# Patient Record
Sex: Male | Born: 1940 | Race: Black or African American | Hispanic: No | Smoking: Current every day smoker
Health system: Southern US, Community
[De-identification: ages and names within clinical notes are randomized; demographics above are authoritative.]

## PROBLEM LIST (undated history)

## (undated) DIAGNOSIS — I10 Essential (primary) hypertension: Secondary | ICD-10-CM

## (undated) DIAGNOSIS — L509 Urticaria, unspecified: Secondary | ICD-10-CM

## (undated) DIAGNOSIS — E538 Deficiency of other specified B group vitamins: Secondary | ICD-10-CM

## (undated) DIAGNOSIS — D649 Anemia, unspecified: Secondary | ICD-10-CM

## (undated) DIAGNOSIS — T783XXA Angioneurotic edema, initial encounter: Secondary | ICD-10-CM

## (undated) DIAGNOSIS — R4189 Other symptoms and signs involving cognitive functions and awareness: Secondary | ICD-10-CM

## (undated) DIAGNOSIS — K226 Gastro-esophageal laceration-hemorrhage syndrome: Secondary | ICD-10-CM

## (undated) DIAGNOSIS — J309 Allergic rhinitis, unspecified: Secondary | ICD-10-CM

## (undated) DIAGNOSIS — E785 Hyperlipidemia, unspecified: Secondary | ICD-10-CM

## (undated) DIAGNOSIS — I77810 Thoracic aortic ectasia: Secondary | ICD-10-CM

## (undated) DIAGNOSIS — M109 Gout, unspecified: Secondary | ICD-10-CM

## (undated) DIAGNOSIS — E119 Type 2 diabetes mellitus without complications: Secondary | ICD-10-CM

## (undated) DIAGNOSIS — N400 Enlarged prostate without lower urinary tract symptoms: Secondary | ICD-10-CM

## (undated) DIAGNOSIS — D369 Benign neoplasm, unspecified site: Secondary | ICD-10-CM

## (undated) DIAGNOSIS — E079 Disorder of thyroid, unspecified: Secondary | ICD-10-CM

## (undated) HISTORY — PX: ESOPHAGOGASTRODUODENOSCOPY: SHX1529

## (undated) HISTORY — DX: Benign neoplasm, unspecified site: D36.9

## (undated) HISTORY — DX: Essential (primary) hypertension: I10

## (undated) HISTORY — DX: Allergic rhinitis, unspecified: J30.9

## (undated) HISTORY — DX: Angioneurotic edema, initial encounter: T78.3XXA

## (undated) HISTORY — DX: Hyperlipidemia, unspecified: E78.5

## (undated) HISTORY — DX: Type 2 diabetes mellitus without complications: E11.9

## (undated) HISTORY — DX: Thoracic aortic ectasia: I77.810

## (undated) HISTORY — PX: COLONOSCOPY: SHX174

## (undated) HISTORY — DX: Disorder of thyroid, unspecified: E07.9

## (undated) HISTORY — DX: Gastro-esophageal laceration-hemorrhage syndrome: K22.6

## (undated) HISTORY — DX: Urticaria, unspecified: L50.9

## (undated) HISTORY — PX: TONSILLECTOMY: SUR1361

## (undated) HISTORY — PX: COLON SURGERY: SHX602

## (undated) HISTORY — DX: Other symptoms and signs involving cognitive functions and awareness: R41.89

## (undated) HISTORY — DX: Gout, unspecified: M10.9

## (undated) HISTORY — DX: Benign prostatic hyperplasia without lower urinary tract symptoms: N40.0

## (undated) HISTORY — PX: ADENOIDECTOMY: SUR15

## (undated) HISTORY — DX: Deficiency of other specified B group vitamins: E53.8

## (undated) HISTORY — DX: Anemia, unspecified: D64.9

---
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
## 2002-09-08 ENCOUNTER — Inpatient Hospital Stay (HOSPITAL_COMMUNITY): Admission: EM | Admit: 2002-09-08 | Discharge: 2002-09-10 | Payer: Self-pay | Admitting: *Deleted

## 2002-09-10 ENCOUNTER — Encounter (INDEPENDENT_AMBULATORY_CARE_PROVIDER_SITE_OTHER): Payer: Self-pay | Admitting: *Deleted

## 2004-05-07 ENCOUNTER — Encounter: Admission: RE | Admit: 2004-05-07 | Discharge: 2004-05-07 | Payer: Self-pay | Admitting: Family Medicine

## 2004-05-18 ENCOUNTER — Encounter: Admission: RE | Admit: 2004-05-18 | Discharge: 2004-05-18 | Payer: Self-pay | Admitting: Family Medicine

## 2004-07-17 ENCOUNTER — Emergency Department (HOSPITAL_COMMUNITY): Admission: EM | Admit: 2004-07-17 | Discharge: 2004-07-17 | Payer: Self-pay | Admitting: *Deleted

## 2004-07-23 ENCOUNTER — Emergency Department (HOSPITAL_COMMUNITY): Admission: EM | Admit: 2004-07-23 | Discharge: 2004-07-23 | Payer: Self-pay | Admitting: Emergency Medicine

## 2005-06-11 ENCOUNTER — Encounter (INDEPENDENT_AMBULATORY_CARE_PROVIDER_SITE_OTHER): Payer: Self-pay | Admitting: Specialist

## 2005-06-11 ENCOUNTER — Ambulatory Visit (HOSPITAL_BASED_OUTPATIENT_CLINIC_OR_DEPARTMENT_OTHER): Admission: RE | Admit: 2005-06-11 | Discharge: 2005-06-11 | Payer: Self-pay | Admitting: Urology

## 2006-03-09 ENCOUNTER — Ambulatory Visit: Payer: Self-pay | Admitting: Internal Medicine

## 2006-04-08 ENCOUNTER — Ambulatory Visit: Payer: Self-pay | Admitting: Internal Medicine

## 2006-05-20 ENCOUNTER — Ambulatory Visit: Payer: Self-pay | Admitting: Internal Medicine

## 2006-05-20 LAB — CONVERTED CEMR LAB
ALT: 22 units/L (ref 0–40)
AST: 25 units/L (ref 0–37)
Albumin: 3.8 g/dL (ref 3.5–5.2)
Alkaline Phosphatase: 56 units/L (ref 39–117)
BUN: 13 mg/dL (ref 6–23)
Basophils Absolute: 0 10*3/uL (ref 0.0–0.1)
Basophils Relative: 0.5 % (ref 0.0–1.0)
CO2: 24 meq/L (ref 19–32)
Calcium: 9.9 mg/dL (ref 8.4–10.5)
Chloride: 104 meq/L (ref 96–112)
Creatinine, Ser: 1.3 mg/dL (ref 0.4–1.5)
Eosinophil percent: 5.9 % — ABNORMAL HIGH (ref 0.0–5.0)
GFR calc non Af Amer: 59 mL/min
Glomerular Filtration Rate, Af Am: 71 mL/min/{1.73_m2}
Glucose, Bld: 100 mg/dL — ABNORMAL HIGH (ref 70–99)
HCT: 34.5 % — ABNORMAL LOW (ref 39.0–52.0)
Hemoglobin: 11.2 g/dL — ABNORMAL LOW (ref 13.0–17.0)
Lymphocytes Relative: 31.9 % (ref 12.0–46.0)
MCHC: 32.4 g/dL (ref 30.0–36.0)
MCV: 89.6 fL (ref 78.0–100.0)
Monocytes Absolute: 0.7 10*3/uL (ref 0.2–0.7)
Monocytes Relative: 14.8 % — ABNORMAL HIGH (ref 3.0–11.0)
Neutro Abs: 2.2 10*3/uL (ref 1.4–7.7)
Neutrophils Relative %: 46.9 % (ref 43.0–77.0)
Platelets: 220 10*3/uL (ref 150–400)
Potassium: 4.1 meq/L (ref 3.5–5.1)
RBC: 3.86 M/uL — ABNORMAL LOW (ref 4.22–5.81)
RDW: 14.2 % (ref 11.5–14.6)
Sed Rate: 40 mm/hr — ABNORMAL HIGH (ref 0–20)
Sodium: 135 meq/L (ref 135–145)
TSH: 0.46 microintl units/mL (ref 0.35–5.50)
Total Bilirubin: 0.9 mg/dL (ref 0.3–1.2)
Total Protein: 7.4 g/dL (ref 6.0–8.3)
WBC: 4.7 10*3/uL (ref 4.5–10.5)

## 2006-08-18 ENCOUNTER — Ambulatory Visit: Payer: Self-pay | Admitting: Internal Medicine

## 2007-02-14 ENCOUNTER — Ambulatory Visit: Payer: Self-pay | Admitting: Internal Medicine

## 2007-02-27 ENCOUNTER — Encounter: Admission: RE | Admit: 2007-02-27 | Discharge: 2007-02-27 | Payer: Self-pay | Admitting: Family Medicine

## 2007-04-12 ENCOUNTER — Ambulatory Visit: Payer: Self-pay | Admitting: Internal Medicine

## 2007-04-12 LAB — CONVERTED CEMR LAB
ALT: 18 units/L (ref 0–53)
ANA Titer 1: 1:40 {titer} — ABNORMAL HIGH
AST: 22 units/L (ref 0–37)
Albumin: 3.8 g/dL (ref 3.5–5.2)
Alkaline Phosphatase: 61 units/L (ref 39–117)
Anti Nuclear Antibody(ANA): POSITIVE — AB
BUN: 10 mg/dL (ref 6–23)
Basophils Absolute: 0 10*3/uL (ref 0.0–0.1)
Basophils Relative: 0.6 % (ref 0.0–1.0)
Bilirubin, Direct: 0.1 mg/dL (ref 0.0–0.3)
CO2: 30 meq/L (ref 19–32)
Calcium: 10.1 mg/dL (ref 8.4–10.5)
Chloride: 104 meq/L (ref 96–112)
Creatinine, Ser: 1.1 mg/dL (ref 0.4–1.5)
Eosinophils Absolute: 0.1 10*3/uL (ref 0.0–0.6)
Eosinophils Relative: 2 % (ref 0.0–5.0)
GFR calc Af Amer: 86 mL/min
GFR calc non Af Amer: 71 mL/min
Glucose, Bld: 104 mg/dL — ABNORMAL HIGH (ref 70–99)
HCT: 32.5 % — ABNORMAL LOW (ref 39.0–52.0)
Hemoglobin: 10.9 g/dL — ABNORMAL LOW (ref 13.0–17.0)
IgE (Immunoglobulin E), Serum: 262.6 intl units/mL — ABNORMAL HIGH (ref 0.0–180.0)
IgE (Immunoglobulin E), Serum: 269.8 intl units/mL — ABNORMAL HIGH (ref 0.0–180.0)
Lymphocytes Relative: 37 % (ref 12.0–46.0)
MCHC: 33.4 g/dL (ref 30.0–36.0)
MCV: 85.8 fL (ref 78.0–100.0)
Monocytes Absolute: 0.7 10*3/uL (ref 0.2–0.7)
Monocytes Relative: 15.6 % — ABNORMAL HIGH (ref 3.0–11.0)
Neutro Abs: 2.2 10*3/uL (ref 1.4–7.7)
Neutrophils Relative %: 44.8 % (ref 43.0–77.0)
Platelets: 252 10*3/uL (ref 150–400)
Potassium: 3.8 meq/L (ref 3.5–5.1)
RBC: 3.79 M/uL — ABNORMAL LOW (ref 4.22–5.81)
RDW: 15.8 % — ABNORMAL HIGH (ref 11.5–14.6)
Sed Rate: 24 mm/hr — ABNORMAL HIGH (ref 0–20)
Sodium: 138 meq/L (ref 135–145)
TSH: 1.54 microintl units/mL (ref 0.35–5.50)
Total Bilirubin: 0.8 mg/dL (ref 0.3–1.2)
Total Protein: 8 g/dL (ref 6.0–8.3)
WBC: 4.7 10*3/uL (ref 4.5–10.5)

## 2007-04-21 DIAGNOSIS — I152 Hypertension secondary to endocrine disorders: Secondary | ICD-10-CM | POA: Insufficient documentation

## 2007-04-21 DIAGNOSIS — L508 Other urticaria: Secondary | ICD-10-CM | POA: Insufficient documentation

## 2007-04-21 DIAGNOSIS — I10 Essential (primary) hypertension: Secondary | ICD-10-CM | POA: Insufficient documentation

## 2007-05-24 ENCOUNTER — Ambulatory Visit: Payer: Self-pay | Admitting: Internal Medicine

## 2007-05-24 DIAGNOSIS — J309 Allergic rhinitis, unspecified: Secondary | ICD-10-CM | POA: Insufficient documentation

## 2007-06-08 DIAGNOSIS — E049 Nontoxic goiter, unspecified: Secondary | ICD-10-CM | POA: Insufficient documentation

## 2007-10-24 ENCOUNTER — Encounter: Admission: RE | Admit: 2007-10-24 | Discharge: 2007-10-24 | Payer: Self-pay | Admitting: Family Medicine

## 2008-02-15 ENCOUNTER — Ambulatory Visit (HOSPITAL_COMMUNITY): Admission: RE | Admit: 2008-02-15 | Discharge: 2008-02-15 | Payer: Self-pay | Admitting: Family Medicine

## 2008-04-04 ENCOUNTER — Encounter (HOSPITAL_COMMUNITY): Admission: RE | Admit: 2008-04-04 | Discharge: 2008-06-03 | Payer: Self-pay | Admitting: Internal Medicine

## 2008-04-26 ENCOUNTER — Encounter (HOSPITAL_COMMUNITY): Admission: RE | Admit: 2008-04-26 | Discharge: 2008-06-03 | Payer: Self-pay | Admitting: Internal Medicine

## 2008-12-19 ENCOUNTER — Ambulatory Visit (HOSPITAL_COMMUNITY): Admission: RE | Admit: 2008-12-19 | Discharge: 2008-12-19 | Payer: Self-pay | Admitting: Internal Medicine

## 2009-06-13 ENCOUNTER — Ambulatory Visit: Payer: Self-pay | Admitting: Internal Medicine

## 2009-06-13 ENCOUNTER — Inpatient Hospital Stay (HOSPITAL_COMMUNITY): Admission: EM | Admit: 2009-06-13 | Discharge: 2009-06-15 | Payer: Self-pay | Admitting: Emergency Medicine

## 2009-06-14 ENCOUNTER — Encounter (INDEPENDENT_AMBULATORY_CARE_PROVIDER_SITE_OTHER): Payer: Self-pay | Admitting: Internal Medicine

## 2009-06-14 ENCOUNTER — Encounter: Payer: Self-pay | Admitting: Internal Medicine

## 2009-07-07 ENCOUNTER — Ambulatory Visit: Payer: Self-pay | Admitting: Internal Medicine

## 2009-07-07 DIAGNOSIS — D509 Iron deficiency anemia, unspecified: Secondary | ICD-10-CM

## 2009-07-07 DIAGNOSIS — D126 Benign neoplasm of colon, unspecified: Secondary | ICD-10-CM | POA: Insufficient documentation

## 2009-07-07 HISTORY — DX: Iron deficiency anemia, unspecified: D50.9

## 2009-07-08 LAB — CONVERTED CEMR LAB
Basophils Absolute: 0 10*3/uL (ref 0.0–0.1)
Basophils Relative: 0.7 % (ref 0.0–3.0)
Eosinophils Absolute: 0.2 10*3/uL (ref 0.0–0.7)
Eosinophils Relative: 2.9 % (ref 0.0–5.0)
HCT: 36.6 % — ABNORMAL LOW (ref 39.0–52.0)
Hemoglobin: 10.9 g/dL — ABNORMAL LOW (ref 13.0–17.0)
Lymphocytes Relative: 23.5 % (ref 12.0–46.0)
Lymphs Abs: 1.4 10*3/uL (ref 0.7–4.0)
MCHC: 29.7 g/dL — ABNORMAL LOW (ref 30.0–36.0)
MCV: 77 fL — ABNORMAL LOW (ref 78.0–100.0)
Monocytes Absolute: 0.8 10*3/uL (ref 0.1–1.0)
Monocytes Relative: 12.6 % — ABNORMAL HIGH (ref 3.0–12.0)
Neutro Abs: 3.7 10*3/uL (ref 1.4–7.7)
Neutrophils Relative %: 60.3 % (ref 43.0–77.0)
Platelets: 323 10*3/uL (ref 150.0–400.0)
RBC: 4.75 M/uL (ref 4.22–5.81)
RDW: 31 % — ABNORMAL HIGH (ref 11.5–14.6)
WBC: 6.1 10*3/uL (ref 4.5–10.5)

## 2009-07-09 ENCOUNTER — Encounter: Payer: Self-pay | Admitting: Internal Medicine

## 2009-07-11 ENCOUNTER — Telehealth: Payer: Self-pay | Admitting: Internal Medicine

## 2009-08-01 ENCOUNTER — Encounter (INDEPENDENT_AMBULATORY_CARE_PROVIDER_SITE_OTHER): Payer: Self-pay | Admitting: Surgery

## 2009-08-01 ENCOUNTER — Inpatient Hospital Stay (HOSPITAL_COMMUNITY): Admission: RE | Admit: 2009-08-01 | Discharge: 2009-08-05 | Payer: Self-pay | Admitting: Surgery

## 2009-08-01 ENCOUNTER — Encounter (INDEPENDENT_AMBULATORY_CARE_PROVIDER_SITE_OTHER): Payer: Self-pay | Admitting: *Deleted

## 2009-08-01 DIAGNOSIS — D369 Benign neoplasm, unspecified site: Secondary | ICD-10-CM

## 2009-08-01 HISTORY — DX: Benign neoplasm, unspecified site: D36.9

## 2009-09-10 ENCOUNTER — Ambulatory Visit: Payer: Self-pay | Admitting: Internal Medicine

## 2009-09-11 LAB — CONVERTED CEMR LAB
Basophils Absolute: 0 10*3/uL (ref 0.0–0.1)
Basophils Relative: 0.6 % (ref 0.0–3.0)
Eosinophils Absolute: 0.1 10*3/uL (ref 0.0–0.7)
Eosinophils Relative: 2.2 % (ref 0.0–5.0)
HCT: 36.6 % — ABNORMAL LOW (ref 39.0–52.0)
Hemoglobin: 12.2 g/dL — ABNORMAL LOW (ref 13.0–17.0)
Lymphocytes Relative: 30.5 % (ref 12.0–46.0)
Lymphs Abs: 1.7 10*3/uL (ref 0.7–4.0)
MCHC: 33.3 g/dL (ref 30.0–36.0)
MCV: 83.3 fL (ref 78.0–100.0)
Monocytes Absolute: 0.7 10*3/uL (ref 0.1–1.0)
Monocytes Relative: 13.3 % — ABNORMAL HIGH (ref 3.0–12.0)
Neutro Abs: 2.9 10*3/uL (ref 1.4–7.7)
Neutrophils Relative %: 53.4 % (ref 43.0–77.0)
Platelets: 227 10*3/uL (ref 150.0–400.0)
RBC: 4.4 M/uL (ref 4.22–5.81)
RDW: 17 % — ABNORMAL HIGH (ref 11.5–14.6)
WBC: 5.5 10*3/uL (ref 4.5–10.5)

## 2009-10-13 ENCOUNTER — Telehealth: Payer: Self-pay | Admitting: Internal Medicine

## 2010-01-22 ENCOUNTER — Telehealth: Payer: Self-pay | Admitting: Internal Medicine

## 2010-01-23 ENCOUNTER — Ambulatory Visit (HOSPITAL_COMMUNITY): Admission: RE | Admit: 2010-01-23 | Discharge: 2010-01-23 | Payer: Self-pay | Admitting: Family Medicine

## 2010-01-30 ENCOUNTER — Inpatient Hospital Stay (HOSPITAL_COMMUNITY): Admission: EM | Admit: 2010-01-30 | Discharge: 2010-02-03 | Payer: Self-pay | Admitting: Emergency Medicine

## 2010-06-27 ENCOUNTER — Telehealth: Payer: Self-pay | Admitting: Internal Medicine

## 2010-06-29 ENCOUNTER — Encounter: Payer: Self-pay | Admitting: Internal Medicine

## 2010-07-07 NOTE — Progress Notes (Signed)
Summary: triage  Phone Note Call from Patient Call back at Home Phone [phone]   Caller: Patient Call For: Dr Leone Payor Reason for Call: Talk to Nurse Summary of Call: Patient wants to speak to nurse rgarding symptoms that he's having, feels weak and light headed thinks that he has low iron again. Initial call taken by: Tawni Levy,  January 22, 2010 9:19 AM  Follow-up for Phone Call        Pt. c/o dizziness X2 weeks. States he has episodes of fatigue and SOB.  Denies abd. pain, n/v, fever, constipation, diarrhea, blood, black stools.  Pt. advised to call his PCP ASAP for a complete examination. Pt. instructed to call back as needed.  Follow-up by: Laureen Ochs LPN,  January 22, 2010 9:31 AM

## 2010-07-07 NOTE — Assessment & Plan Note (Signed)
Summary: 6 WEEKS/OK PER LEE/APC   Visit Type:  Follow-up PCP:  AV Blount  Chief Complaint:  6wk follow-up.  History of Present Illness: Urticaria- Quiet phase, much less active hives since Sept. Minor one transient on R biceps yesterday. Discussed this as clue to etiology- role of heat?, role of ongoing meds?. Lesions eventually fade completely. never angioedema or wheeze.  Skin Test positives 04/12/07 for aeroallergens, not foods RAST IgE elevated 262.6- 04/14/07, but neg for specific allergens on thaqt assay Not candidate for Xolair. Discussed ideopathic.    Current Allergies: No known allergies      Review of Systems       Mild nasal congestion and eyes water- relates to colds, pollen but not strongly related to hives.   Vital Signs:  Patient Profile:   70 Years Old Male Weight:      189 pounds O2 Sat:      99 % Pulse rate:   88 / minute BP sitting:   122 / 88  (left arm)  Pt. in pain?   no  Vitals Entered By: Cloyde Reams RN (May 24, 2007 9:06 AM) Oxygen therapy Room Air              Comments Urticaria clearing.  Skin looks much better has a couple residual places present.Follow-up on lab work.  Medications reviewed      Physical Exam  General:     normal appearance.   Head:     normocephalic and atraumatic Ears:     TMs intact and clear with normal canals Nose:     no deformity, discharge, inflammation, or lesions Mouth:     no deformity or lesions Neck:     no JVD.   Chest Wall:     no deformities noted Lungs:     clear bilaterally to auscultation and percussion Heart:     regular rate and rhythm, S1, S2 without murmurs, rubs, gallops, or clicks Abdomen:     No hepatosplenomegaly Skin:     No visible urticaria now Cervical Nodes:     no significant adenopathy Axillary Nodes:     no significant adenopathy     Impression & Recommendations:  Problem # 1:  URTICARIA (ICD-708.9) Assessment: Improved Observe for now. as needed  antihistamine or topical steroid. Orders: Est. Patient Level III ([address])   Problem # 2:  ALLERGIC RHINITIS (ICD-477.9) Skin test positive. Seasonal. His updated medication list for this problem includes:    Allegra 180 Mg Tabs (Fexofenadine hcl) .Marland Kitchen... Take 1 tablet by mouth once a day  Orders: Est. Patient Level III ([address])    Patient Instructions: 1)  Please schedule a follow-up appointment in 1 year. 2)  Return eaqrlier as needed 3)  Try taking antihistamine like Claritin, Zyrtec or Benadryl at first sign that a hive is starting.   ]

## 2010-07-07 NOTE — Procedures (Signed)
Summary: Colonoscopy  Patient: [name] Note: All result statuses are Final unless otherwise noted.  Tests: (1) Colonoscopy (COL)   COL Colonoscopy           DONE     [facility]     [address]     [place], [place]  [address]           COLONOSCOPY PROCEDURE REPORT           PATIENT:  [name], [name]  MR#:  [id number]     BIRTHDATE:  [birth year], 68 yrs. old  GENDER:  male           ENDOSCOPIST:  Iva Boop, MD, [facility]     Referred by:  [facility]           PROCEDURE DATE:  06/14/2009     PROCEDURE:  Colonoscopy with biopsy     ASA CLASS:  Class III     INDICATIONS:  FOBT positive stool, iron deficiency anemia           MEDICATIONS:   Fentanyl 75 mcg, Versed 7 mg           DESCRIPTION OF PROCEDURE:   After the risks benefits and     alternatives of the procedure were thoroughly explained, informed     consent was obtained.  Digital rectal exam was performed and     revealed no abnormalities.   The [id number] ([id number]) endoscope was     introduced through the anus and advanced to the cecum, which was     identified by both the appendix and ileocecal valve, without     limitations.  The quality of the prep was good, using Colyte.  The     instrument was then slowly withdrawn as the colon was fully     examined over 10 minutse 21 seconds.     <<PROCEDUREIMAGES>>           FINDINGS:  A sessile polyp was found in the ascending colon. It     was fleshy and soft. It was 3 - 4 cm in size. Multiple biopsies     were obtained and sent to pathology.  This was otherwise a normal     examination of the colon.   Retroflexed views in the rectum     revealed no abnormalities.    The scope was then withdrawn from     the patient and the procedure completed.           COMPLICATIONS:  None           ENDOSCOPIC IMPRESSION:     1) 3 - 4 cm sessile polyp in the ascending colon - biopsies     taken     2) Otherwise normal examination  RECOMMENDATIONS:     if there is cancer in the polyp then definitely needs surgical     resecton, if not will need to discuss possible colonoscopic     polypectomy vs. surgery. Will review this with him. There is no     urgency to remove this, i.e. can be discharged and seen as an     outpatient about this.           EGD next to further evaluate severe microcytic anemia (ferritin 5)                 REPEAT EXAM:  In for Colonoscopy, pending biopsy results.  Iva Boop, MD, [facility]           CC:  Clyda Greener, MD     The Patient           n.     eSIGNED:   Iva Boop at 06/14/2009 12:01 PM           Glenis Smoker, [id number]  Note: An exclamation mark (!) indicates a result that was not dispersed into the flowsheet. Document Creation Date: 06/14/2009 12:02 PM _______________________________________________________________________  (1) Order result status: Final Collection or observation date-time: 06/14/2009 11:27 Requested date-time:  Receipt date-time:  Reported date-time:  Referring Physician:   Ordering Physician: Stan Head [phone]) Specimen Source:  Source: Launa Grill Order Number: [id number] Lab site:

## 2010-07-07 NOTE — Progress Notes (Signed)
Summary: Refill Ferrous Sulfate  Phone Note Call from Patient Call back at [facility] Phone [phone]   Caller: Patient Call For: Dr. Leone Payor Reason for Call: Refill Medication Summary of Call: iron suppliment... [facility] [address] Initial call taken by: Vallarie Mare,  July 11, 2009 9:59 AM  Follow-up for Phone Call        [facility], pt is taking generic Ferrous Sulfate 325mg  three times a day.  Verbal authorization given for 3 refills. Follow-up by: Francee Piccolo CMA Duncan Dull),  July 11, 2009 3:13 PM    Prescriptions: FERROUS SULFATE 325 (65 FE) MG TABS (FERROUS SULFATE) one tablet by mouth three times a day  #90 x 2   Entered by:   Francee Piccolo CMA (AAMA)   Authorized by:   Iva Boop MD, [facility]   Signed by:   Francee Piccolo CMA (AAMA) on 07/11/2009   Method used:   Telephoned to ...       [facility] [address] (retail)       [address] [place], [place]  [address]       Ph: [phone]       Fax: [phone]   RxID:   [id number]

## 2010-07-07 NOTE — Progress Notes (Signed)
Summary: Iron Meds refill  Phone Note Call from Patient Call back at Home Phone [phone]   Call For: Dr Darrick Grinder Reason for Call: Refill Medication Summary of Call: iron medication is almost all gone - unsure if this he needs to continue taking or not. Would like call back. Initial call taken by: Leanor Kail [facility],  Oct 13, 2009 8:13 AM  Follow-up for Phone Call        notified pt to continue ferrous sulfate and come fpr repeat CBC and Ferritin in July.  Advised pt I would send refill to pharmacy for him.  Pt voices inderstanding. Follow-up by: Francee Piccolo CMA Duncan Dull),  Oct 13, 2009 10:18 AM    Prescriptions: FERROUS SULFATE 325 (65 FE) MG TABS (FERROUS SULFATE) one tablet by mouth three times a day  #90 x 3   Entered by:   Francee Piccolo CMA (AAMA)   Authorized by:   Iva Boop MD, [facility]   Signed by:   Francee Piccolo CMA (AAMA) on 10/13/2009   Method used:   Electronically to        [facility] [address] [facility] [phone][facility] (retail)       [address] [place], [place]  [address]       Ph: [phone]       Fax: [phone]   RxID:   [id number]

## 2010-07-07 NOTE — Procedures (Signed)
Summary: EGD: Mallory-Weiss tear   EGD  Procedure date:  09/10/2002  Findings:      Location: [place]      NAME:  [name], [name]                         ACCOUNT NO.:  [id number]   MEDICAL RECORD NO.:  [id number]                   PATIENT TYPE:  INP   LOCATION:  5529                                 FACILITY:  [facility]   PHYSICIAN:  James L. Malon Kindle., M.D.          DATE OF BIRTH:  07-30-1940   DATE OF PROCEDURE:  09/10/2002  DATE OF DISCHARGE:                                 OPERATIVE REPORT   PROCEDURE PERFORMED:  Esophagogastroduodenoscopy and biopsy.   MEDICATIONS:  Demerol 100 mcg, Versed 2 mg IV.   INSTRUMENT USED:  Olympus scope.   ENDOSCOPIST:  Llana Aliment. Randa Evens, M.D.   INDICATIONS FOR PROCEDURE:  The patient has history of gastrointestinal  bleeding.  Had hematemesis after eating.  Has had no further bleeding, no  chronic symptoms.   DESCRIPTION OF PROCEDURE:  The procedure had been explained to the patient  and consent obtained.  With the patient in left lateral decubitus position,  the Olympus scope was inserted and advanced.  The distal esophagus was  reached.  There was a 2 cm hiatal hernia.  Right at the gastroesophageal  junction was an ulcerated area that was swollen and edematous but not  actively bleeding consistent with Mallory-Weiss tear.  The stomach was  entered.  The pylorus identified and passed.  There were no duodenal ulcers.  The pyloric channel was normal.  The antrum was normal.  In the midbody of  the stomach was a 0.5 cm sessile gastric polyp.  I chose to biopsy it rather  than to remove it at this time in order to not create another possible  bleeding site.  The scope was withdrawn.  The fundus and cardia were seen  well.  There was a 2 cm hiatal hernia.  The Mallory-Weiss tear was confirmed  upon withdrawal.  The esophagus was otherwise normal.  The scope was  withdrawn.  The patient tolerated the procedure well.    ASSESSMENT:  1. 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  2. Gastric polyp.    PLAN:  Will continue on Protonix, change to p.o.  Send home when stable.  Will follow up with the office in three to four weeks.  Depending on the  path report, we may end up going ahead and removing the gastric polyp at  that time.                                               James L. Malon Kindle., M.D.    Waldron Session  D:  09/10/2002  T:  09/10/2002  Job:  [id number]   cc:   Dineen Kid. Reche Dixon, M.D.  [address]  [place]  [place] [address]  Fax: [phone]   Dr. Britt Bottom Blunt   This report was created from the original endoscopy report, which was reviewed and signed by the above listed endoscopist.

## 2010-07-07 NOTE — Letter (Signed)
Summary: [facility]   Imported By: Lester [place] 08/04/2009 09:59:36  _____________________________________________________________________  External Attachment:    Type:   Image     Comment:   External Document

## 2010-07-07 NOTE — Procedures (Signed)
Summary: Upper Endoscopy  Patient: [name] Note: All result statuses are Final unless otherwise noted.  Tests: (1) Upper Endoscopy (EGD)   EGD Upper Endoscopy       DONE     [name]     [address]     [place], [place]  [address]           ENDOSCOPY PROCEDURE REPORT           PATIENT:  [name], [name]  MR#:  [id number]     BIRTHDATE:  Oct 31, 1940, 68 yrs. old  GENDER:  male           ENDOSCOPIST:  Iva Boop, MD, [facility]     Referred by:  [facility]           PROCEDURE DATE:  06/14/2009     PROCEDURE:  EGD with snare polypectomy, EGD with Submucosal     Injection     ASA CLASS:  Class III     INDICATIONS:  FOBT + stool, iron deficiency anemia           MEDICATIONS:   There was residual sedation effect present from     prior procedure., Fentanyl 25 mcg, Versed 2 mg     TOPICAL ANESTHETIC:  Cetacaine Spray           DESCRIPTION OF PROCEDURE:   After the risks benefits and     alternatives of the procedure were thoroughly explained, informed     consent was obtained.  The EG-[id number] ([id number]) endoscope was     introduced through the mouth and advanced to the second portion of     the duodenum, without limitations.  The instrument was slowly     withdrawn as the mucosa was fully examined.     <<PROCEDUREIMAGES>>           A sessile polyp was found in the body of the stomach. It was 1 cm     in size. epinephrine injection Polyp was snared, then cauterized     with monopolar cautery. Polyp was retrieved and sent to pathology.     3 cc EPI prior to polypectomy  Otherwise the examination was     normal.    Retroflexed views revealed no abnormalities.    The     scope was then withdrawn from the patient and the procedure     completed.           COMPLICATIONS:  None           ENDOSCOPIC IMPRESSION:     1) 1 cm sessile polyp in the body of the stomach - removed     2) Otherwise normal examination     RECOMMENDATIONS:     1) await  pathology results     iv iron here     po iron at dc     PPI qd x 2 weeks due to gastric polypectomy (reduce risk of     bleeding)     no ASA/NSAIDS     methylmalonic acid/homocysteine levels (B12 is 214)     B12 1000 ug x 1     home tomorrow if ok and I will contact him when pathology in           REPEAT EXAM:  as needed           Iva Boop, MD, [facility]           CC:  Britt Bottom  Bruna Potter, MD, The Patient           n.     eSIGNED:   Iva Boop at 06/14/2009 12:10 PM           [name], [id number]  Note: An exclamation mark (!) indicates a result that was not dispersed into the flowsheet. Document Creation Date: 06/14/2009 12:11 PM _______________________________________________________________________  (1) Order result status: Final Collection or observation date-time: 06/14/2009 11:44 Requested date-time:  Receipt date-time:  Reported date-time:  Referring Physician:   Ordering Physician: Stan Head [phone]) Specimen Source:  Source: Launa Grill Order Number: [id number] Lab site:

## 2010-07-07 NOTE — Assessment & Plan Note (Signed)
Summary: follow up colonoscopy and discuss polyp removal/sheri   History of Present Illness Visit Type: follow up  Primary GI MD: Stan Head MD [facility] Primary Provider: Amalia Hailey, MD  Requesting Provider: n/a Chief Complaint: Follow up colonoscopy  History of Present Illness:   Admitted early January due to severe, symptomatic anemia, was iron-deficient. 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 reacted to iv dextran infusion in hospital, that is my recollection but not documented and he is notr certain, which was part of the problem)  says he saw Dr. Bruna Potter last week but no blood drawn is taking iron, feels stronger   GI Review of Systems      Denies abdominal pain, acid reflux, belching, bloating, chest pain, dysphagia with liquids, dysphagia with solids, heartburn, loss of appetite, nausea, vomiting, vomiting blood, weight loss, and  weight gain.        Denies anal fissure, black tarry stools, change in bowel habit, constipation, diarrhea, diverticulosis, fecal incontinence, heme positive stool, hemorrhoids, irritable bowel syndrome, jaundice, light color stool, liver problems, rectal bleeding, and  rectal pain.    Current Medications (verified): 1)  Flomax 0.4 Mg  Cp24 (Tamsulosin Hcl) .... 2 Tabs At Bedtime 2)  Diovan Hct 160-12.5 Mg  Tabs (Valsartan-Hydrochlorothiazide) .... Take 1 Tablet By Mouth Once A Day 3)  Pravastatin Sodium 80 Mg Tabs (Pravastatin Sodium) .... One Tablet By Mouth Once Daily 4)  Ferrous Sulfate 325 (65 Fe) Mg Tabs (Ferrous Sulfate) .... One Tablet By Mouth Three Times A Day  Allergies (verified): 1)  ! ?infed (Iron Dextran) (Iron Dextran)  Past History:  Past Medical History: BPH Hyperlipidemia Hypertension Iron-deficiency Anemia dx 1/11 Tubulovillous adeoma ascending  clon 1/11 colonoscopy   Past Surgical History: Cholecystectomy Right Knee Surgery   Family History: No FH of Colon Cancer:  Social History: Occupation: Retired Single  Patient currently smokes.  Alcohol Use - no Daily Caffeine Use: coffee  Illicit Drug Use - no Smoking Status:  current Drug Use:  no  Review of Systems       The patient complains of fatigue.         All other ROS negative except as per HPI.   Vital Signs:  Patient profile:   70 year old male Height:      74 inches Weight:      180 pounds BMI:     23.19 BSA:     2.08 Pulse rate:   88 / minute Pulse rhythm:   regular BP sitting:   132 / 80  (left arm) Cuff size:   regular  Vitals Entered By: Ok Anis CMA (July 07, 2009 9:29 AM)  Physical Exam  General:  Well developed, well nourished, no acute distress.   Impression & Recommendations:  Problem # 1:  TUBULOVILLOUS ADENOMA, COLON (ICD-211.3) Assessment Unchanged 3-4 cm lesion in ascending colon, biopsied only. Not tattooed or clipped so not yet marked. We spent 15+ minutes discussing the need for removal of the polyp. I explained what I thought colonoscopic removal (likely 3 colonoscopies in 1 year with no guarantee of complete removal) and surgerical resection (more definitive but different risks, including change in bowels if hemi-colectomy) He has decided to pursue surgery. Appointment made with Dr. Michaell Cowing. Could need pre-op colonoscopy to mark polyp (vs. intra-op by [facility]). Orders: [address] [place]  [facility] ([facility])  Problem # 2:  ANEMIA-IRON DEFICIENCY (ICD-280.9) is taking iron has had EGD/colon as described above Do not think additional work-up needed at this time. [facility] records show normocytic Hgb 11.2 in 2007 ([facility]) Orders: TLB-CBC Platelet - w/Differential (85025-[facility]CD)  Patient Instructions: 1)  Please go to the basement to have your lab tests drawn today. 2)  Your surgery consult has been scheduled with Dr.  Michaell Cowing at [facility] on 07/09/09 @ 9:20am. 3)  Copy sent to : Karie Soda, MD, Clyda Greener, MD 4)  The medication list was reviewed and reconciled.  All changed / newly prescribed medications were explained.  A complete medication list was provided to the patient / caregiver.  Appended Document: follow up colonoscopy and discuss polyp removal/sheri colon recall 1 year to be entered  Appended Document: follow up colonoscopy and discuss polyp removal/sheri Recall entered in IDX and Flowsheet.  Clinical Lists Changes  Observations: Added new observation of COLONNXTDUE: 06/2010 (06/14/2009 14:44)

## 2010-07-09 NOTE — Letter (Signed)
Summary: Appt Reminder 2  [facility]  [address] [place], [place] [address]   Phone: [phone]  Fax: [phone]        June 29, 2010 MRN: [id number]    [name] [address] [place], [place]  [address]    Dear [name],   You have a return appointment with the office nurse on 08/04/10 to discuss your upcoming colonoscopy on 08/11/10.  Please go to the laboratory following this appointment for some blood work for Dr. Leone Payor.    Sincerely,    Darcey Nora RN, CGRN

## 2010-07-09 NOTE — Letter (Signed)
Summary: Pre Visit Letter Revised  [name]  [address] [place], [place] [address]   Phone: [phone]  Fax: [phone]        06/29/2010 MRN: [id number] MAIJOR HORNIG [address] [place], [place]  [address]             Procedure Date:  08/11/10 10:30 a.m.  Welcome to the [name] at [facility].    You are scheduled to see a nurse for your pre-procedure visit on 08/04/10 at 1:00 p.m. on the 3rd floor at [facility], [address]  We ask that you try to arrive at our office 15 minutes prior to your appointment time to allow for check-in.  Please take a minute to review the attached form.  If you answer "Yes" to one or more of the questions on the first page, we ask that you call the person listed at your earliest opportunity.  If you answer "No" to all of the questions, please complete the rest of the form and bring it to your appointment.    Your nurse visit will consist of discussing your medical and surgical history, your immediate family medical history, and your medications.   If you are unable to list all of your medications on the form, please bring the medication bottles to your appointment and we will list them.  We will need to be aware of both prescribed and over the counter drugs.  We will need to know exact dosage information as well.    Please be prepared to read and sign documents such as consent forms, a financial agreement, and acknowledgement forms.  If necessary, and with your consent, a friend or relative is welcome to sit-in on the nurse visit with you.  Please bring your insurance card so that we may make a copy of it.  If your insurance requires a referral to see a specialist, please bring your referral form from your primary care physician.  No co-pay is required for this nurse visit.     If you cannot keep your appointment, please call [phone] to cancel or reschedule prior to your appointment  date.  This allows [name] the opportunity to schedule an appointment for another patient in need of care.    Thank you for choosing [name] for your medical needs.  We appreciate the opportunity to care for you.  Please visit [name] at our website  to learn more about our practice.  Sincerely, [name]

## 2010-07-09 NOTE — Progress Notes (Signed)
Summary: needs colonopscopy and need to see about f/u cbc, etc  Phone Note Outgoing Call   Summary of Call: he is due for colonoscopy and should get previsit looking back we did not get follow-up of anemia labs, etc he was advised to go to PCP in 01/2010 1) request labs from PCP - if done 2) if did not have labs then needs a CBC and ferritin to follow-up iron-def anemia Iva Boop MD, [facility]  June 27, 2010 10:04 AM   Follow-up for Phone Call        no recent labs by PCP.  patient is scheduled for colon 08/11/10 10:30 and a pre-visit on 08/04/10 .  He will come for lab work after his pre-visit Follow-up by: Darcey Nora RN, CGRN,  June 29, 2010 4:21 PM

## 2010-08-03 ENCOUNTER — Encounter (INDEPENDENT_AMBULATORY_CARE_PROVIDER_SITE_OTHER): Payer: Self-pay | Admitting: *Deleted

## 2010-08-04 ENCOUNTER — Encounter (INDEPENDENT_AMBULATORY_CARE_PROVIDER_SITE_OTHER): Payer: Self-pay | Admitting: *Deleted

## 2010-08-05 ENCOUNTER — Encounter (INDEPENDENT_AMBULATORY_CARE_PROVIDER_SITE_OTHER): Payer: Self-pay | Admitting: *Deleted

## 2010-08-05 ENCOUNTER — Other Ambulatory Visit: Payer: Medicare Other

## 2010-08-05 ENCOUNTER — Other Ambulatory Visit: Payer: Self-pay | Admitting: Internal Medicine

## 2010-08-05 DIAGNOSIS — D509 Iron deficiency anemia, unspecified: Secondary | ICD-10-CM

## 2010-08-05 LAB — CBC WITH DIFFERENTIAL/PLATELET
Basophils Absolute: 0 10*3/uL (ref 0.0–0.1)
Basophils Relative: 0.5 % (ref 0.0–3.0)
Eosinophils Absolute: 0.2 10*3/uL (ref 0.0–0.7)
Eosinophils Relative: 4.7 % (ref 0.0–5.0)
HCT: 37.6 % — ABNORMAL LOW (ref 39.0–52.0)
Hemoglobin: 12.5 g/dL — ABNORMAL LOW (ref 13.0–17.0)
Lymphocytes Relative: 23.4 % (ref 12.0–46.0)
Lymphs Abs: 1.2 10*3/uL (ref 0.7–4.0)
MCHC: 33.2 g/dL (ref 30.0–36.0)
MCV: 89.6 fl (ref 78.0–100.0)
Monocytes Absolute: 0.6 10*3/uL (ref 0.1–1.0)
Monocytes Relative: 12.1 % — ABNORMAL HIGH (ref 3.0–12.0)
Neutro Abs: 3 10*3/uL (ref 1.4–7.7)
Neutrophils Relative %: 59.3 % (ref 43.0–77.0)
Platelets: 233 10*3/uL (ref 150.0–400.0)
RBC: 4.19 Mil/uL — ABNORMAL LOW (ref 4.22–5.81)
RDW: 13.3 % (ref 11.5–14.6)
WBC: 5.1 10*3/uL (ref 4.5–10.5)

## 2010-08-05 LAB — FERRITIN: Ferritin: 117.2 ng/mL (ref 22.0–322.0)

## 2010-08-11 ENCOUNTER — Other Ambulatory Visit: Payer: Self-pay | Admitting: Internal Medicine

## 2010-08-11 ENCOUNTER — Other Ambulatory Visit (AMBULATORY_SURGERY_CENTER): Payer: Medicare Other | Admitting: Internal Medicine

## 2010-08-11 ENCOUNTER — Other Ambulatory Visit: Payer: Medicare Other

## 2010-08-11 ENCOUNTER — Other Ambulatory Visit: Payer: Self-pay | Admitting: Gastroenterology

## 2010-08-11 ENCOUNTER — Encounter: Payer: Self-pay | Admitting: Internal Medicine

## 2010-08-11 DIAGNOSIS — R933 Abnormal findings on diagnostic imaging of other parts of digestive tract: Secondary | ICD-10-CM

## 2010-08-11 DIAGNOSIS — K5289 Other specified noninfective gastroenteritis and colitis: Secondary | ICD-10-CM

## 2010-08-11 DIAGNOSIS — D509 Iron deficiency anemia, unspecified: Secondary | ICD-10-CM

## 2010-08-11 DIAGNOSIS — Z1211 Encounter for screening for malignant neoplasm of colon: Secondary | ICD-10-CM

## 2010-08-11 DIAGNOSIS — Z79899 Other long term (current) drug therapy: Secondary | ICD-10-CM

## 2010-08-11 DIAGNOSIS — Z86010 Personal history of colonic polyps: Secondary | ICD-10-CM

## 2010-08-11 LAB — VITAMIN B12: Vitamin B-12: 153 pg/mL — ABNORMAL LOW (ref 211–911)

## 2010-08-11 LAB — GLUCOSE, CAPILLARY
Glucose-Capillary: 105 mg/dL — ABNORMAL HIGH (ref 70–99)
Glucose-Capillary: 93 mg/dL (ref 70–99)

## 2010-08-13 NOTE — Letter (Signed)
Summary: Diabetic Instructions  [facility]  [address] [place], [place] [address]   Phone: [phone]  Fax: [phone]    [name] [birth year] MRN: [id number]   _  x_   ORAL DIABETIC MEDICATION INSTRUCTIONS  The day before your procedure:   Take your diabetic pill as you do normally  The day of your procedure:   Do not take your diabetic pill    We will check your blood sugar levels during the admission process and again in Recovery before discharging you home  ________________________________________________________________________  _x  _   INSULIN (LONG ACTING) MEDICATION INSTRUCTIONS (Lantus, NPH, 70/30, Humulin, Novolin-N)   The day before your procedure:   Take  your regular evening dose    The day of your procedure:   Do not take your morning dose

## 2010-08-13 NOTE — Letter (Signed)
Summary: [facility] Instructions  [facility]  [address] [place], [place] [address]   Phone: [phone]  Fax: [phone]       [name]    [birth year]    MRN: [id number]        Procedure Day Dorna Bloom:  [name]  08/11/10     Arrival Time:  9:30AM     Procedure Time:  10:30AM     Location of Procedure:                    _X _  [facility] (4th Floor)                      PREPARATION FOR COLONOSCOPY WITH MOVIPREP   Starting 5 days prior to your procedure 08/06/10 do not eat nuts, seeds, popcorn, corn, beans, peas,  salads, or any raw vegetables.  Do not take any fiber supplements (e.g. Metamucil, Citrucel, and Benefiber).  THE DAY BEFORE YOUR PROCEDURE         DATE: 08/10/10  DAY: MONDAY  1.  Drink clear liquids the entire day-NO SOLID FOOD  2.  Do not drink anything colored red or purple.  Avoid juices with pulp.  No orange juice.  3.  Drink at least 64 oz. (8 glasses) of fluid/clear liquids during the day to prevent dehydration and help the prep work efficiently.  CLEAR LIQUIDS INCLUDE: Water Jello Ice Popsicles Tea (sugar ok, no milk/cream) Powdered fruit flavored drinks Coffee (sugar ok, no milk/cream) Gatorade Juice: apple, white grape, white cranberry  Lemonade Clear bullion, consomm, broth Carbonated beverages (any kind) Strained chicken noodle soup Hard Candy                             4.  In the morning, mix first dose of MoviPrep solution:    Empty 1 Pouch A and 1 Pouch B into the disposable container    Add lukewarm drinking water to the top line of the container. Mix to dissolve    Refrigerate (mixed solution should be used within 24 hrs)  5.  Begin drinking the prep at 5:00 p.m. The MoviPrep container is divided by 4 marks.   Every 15 minutes drink the solution down to the next mark (approximately 8 oz) until the full liter is complete.   6.  Follow completed prep with 16 oz of clear liquid of your choice (Nothing  red or purple).  Continue to drink clear liquids until bedtime.  7.  Before going to bed, mix second dose of MoviPrep solution:    Empty 1 Pouch A and 1 Pouch B into the disposable container    Add lukewarm drinking water to the top line of the container. Mix to dissolve    Refrigerate  THE DAY OF YOUR PROCEDURE      DATE: 08/11/10  DAY: TUESDAY  Beginning at 5:30AM (5 hours before procedure):         1. Every 15 minutes, drink the solution down to the next mark (approx 8 oz) until the full liter is complete.  2. Follow completed prep with 16 oz. of clear liquid of your choice.    3. You may drink clear liquids until 8:30AM (2 HOURS BEFORE PROCEDURE).   MEDICATION INSTRUCTIONS  Unless otherwise instructed, you should take regular prescription medications with a small sip of water   as early as possible the morning of your  procedure.  Diabetic patients - see separate instructions.         OTHER INSTRUCTIONS  You will need a responsible adult at least 71 years of age to accompany you and drive you home.   This person must remain in the waiting room during your procedure.  Wear loose fitting clothing that is easily removed.  Leave jewelry and other valuables at home.  However, you may wish to bring a book to read or  an iPod/MP3 player to listen to music as you wait for your procedure to start.  Remove all body piercing jewelry and leave at home.  Total time from sign-in until discharge is approximately 2-3 hours.  You should go home directly after your procedure and rest.  You can resume normal activities the  day after your procedure.  The day of your procedure you should not:   Drive   Make legal decisions   Operate machinery   Drink alcohol   Return to work  You will receive specific instructions about eating, activities and medications before you leave.    The above instructions have been reviewed and explained to me by   Wyona Almas RN  August 04, 2010 1:14 PM     I fully understand and can verbalize these instructions _____________________________ Date _________

## 2010-08-13 NOTE — Miscellaneous (Signed)
Summary: [facility] Previsit/prep  Clinical Lists Changes  Medications: Added new medication of MOVIPREP 100 GM  SOLR (PEG-KCL-NACL-NASULF-NA ASC-C) As per prep instructions. - Signed Rx of MOVIPREP 100 GM  SOLR (PEG-KCL-NACL-NASULF-NA ASC-C) As per prep instructions.;  #1 x 0;  Signed;  Entered by: Wyona Almas RN;  Authorized by: Iva Boop MD, [facility];  Method used: Electronically to [facility] # [phone]*, [address] [place], [place], [place]  [address], Ph: [phone], Fax: [phone] Observations: Added new observation of ALLERGY REV: Done (08/04/2010 12:40)    Prescriptions: MOVIPREP 100 GM  SOLR (PEG-KCL-NACL-NASULF-NA ASC-C) As per prep instructions.  #1 x 0   Entered by:   Wyona Almas RN   Authorized by:   Iva Boop MD, [facility]   Signed by:   Wyona Almas RN on 08/04/2010   Method used:   Electronically to        [facility] # [phone][facility] (retail)       [address] [place], [place]  [address]       Ph: [phone]       Fax: [phone]   RxID:   [id number]   Appended Document: [facility] Previsit/prep Signed accidentally.  The doctor is Stan Head.

## 2010-08-14 ENCOUNTER — Encounter (INDEPENDENT_AMBULATORY_CARE_PROVIDER_SITE_OTHER): Payer: Medicare Other

## 2010-08-14 ENCOUNTER — Encounter: Payer: Self-pay | Admitting: Internal Medicine

## 2010-08-14 DIAGNOSIS — E538 Deficiency of other specified B group vitamins: Secondary | ICD-10-CM

## 2010-08-18 ENCOUNTER — Encounter: Payer: Self-pay | Admitting: Internal Medicine

## 2010-08-18 NOTE — Miscellaneous (Signed)
Summary: Orders Update  Clinical Lists Changes  Orders: Added new Test order of TLB-B12, Serum-Total ONLY (82607-B12) - Signed 

## 2010-08-18 NOTE — Assessment & Plan Note (Signed)
Summary: b12 injection #1 of 4 weekly/sheri  Nurse Visit   Allergies: 1)  ! 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

## 2010-08-18 NOTE — Procedures (Addendum)
Summary: Colonoscopy  Patient: [name] Note: All result statuses are Final unless otherwise noted.  Tests: (1) Colonoscopy (COL)   COL Colonoscopy           DONE (C)     [facility]     [address]     [place], [place]  [address]          COLONOSCOPY PROCEDURE REPORT          PATIENT:  [name], [name]  MR#:  [id number]     BIRTHDATE:  [birth year], 69 yrs. old  GENDER:  male     ENDOSCOPIST:  Iva Boop, MD, [facility]          PROCEDURE DATE:  08/11/2010     PROCEDURE:  Colonoscopy with biopsy     ASA CLASS:  Class II     INDICATIONS:  surveillance and high-risk screening 4 cm adenoma     removed last year (found at index colonoscopy), closer follow-up     exam due to size of polyp     MEDICATIONS:  None ADDENDUM: Fentanyl 75 microgranms IV and Versed     7 milligrams IV          DESCRIPTION OF PROCEDURE:   After the risks benefits and     alternatives of the procedure were thoroughly explained, informed     consent was obtained.  Digital rectal exam was performed and     revealed no rectal masses and prostate nodule.  ? nodule at right     lateral base of prostate The LB 180AL [id number] endoscope was     introduced through the anus and advanced to the anastomosis,     without limitations.  The quality of the prep was adequate, using     MoviPrep.  The instrument was then slowly withdrawn as the colon     was fully examined. Insertion: 3:12 minutes withdrawal: 9:35     minutes     <<PROCEDUREIMAGES>>          FINDINGS:  Prior right hemi-colectomy. Abnormal appearing mucosa     anastomosis ? neoplastic change vs prominent small bowel mucosa -     1 cm area Multiple biopsies were obtained and sent to pathology.     This was otherwise a normal examination of the colon.   Retroflexed     views in the rectum revealed no abnormalities.    The scope was     then withdrawn from the patient and the procedure completed.          COMPLICATIONS:  None  ENDOSCOPIC IMPRESSION:     1) Abnormal mucosa in the anastomosis, ? neoplasia vs. prominant     small bowel mucosa     2) Otherwise normal examination, s/p resection of 4 cm adenoma     2011 (right hemi-colectomy)     RECOMMENDATIONS:     1) Go to lab for B12 level today. His Hgb is 12.5, normocytic     with ferritin >100 now.     2) Unless he has seen urology, will have him referred re?     prostate nodule - will notify via my office     REPEAT EXAM:  In for Colonoscopy, pending biopsy results.          Iva Boop, MD, [facility]          CC:  Clyda Greener, MD, Karie Soda, MD and The Patient  n.     REVISED:  08/18/2010 07:44 AM     eSIGNED:   Iva Boop at 08/18/2010 07:44 AM          Glenis Smoker, [phone]  Note: An exclamation mark (!) indicates a result that was not dispersed into the flowsheet. Document Creation Date: 08/18/2010 7:44 AM _______________________________________________________________________  (1) Order result status: Final Collection or observation date-time: 08/11/2010 11:32 Requested date-time:  Receipt date-time:  Reported date-time:  Referring Physician:   Ordering Physician: Stan Head [id number]) Specimen Source:  Source: Launa Grill Order Number: [id number] Lab site:   Appended Document: Colonoscopy     Procedures Next Due Date:    Colonoscopy: 08/2013

## 2010-08-20 LAB — POCT I-STAT 3, ART BLOOD GAS (G3+)
Acid-base deficit: 1 mmol/L (ref 0.0–2.0)
Bicarbonate: 23.6 mEq/L (ref 20.0–24.0)
O2 Saturation: 94 %
Patient temperature: 98.6
TCO2: 25 mmol/L (ref 0–100)
pCO2 arterial: 40.1 mmHg (ref 35.0–45.0)
pH, Arterial: 7.378 (ref 7.350–7.450)
pO2, Arterial: 70 mmHg — ABNORMAL LOW (ref 80.0–100.0)

## 2010-08-20 LAB — URINALYSIS, ROUTINE W REFLEX MICROSCOPIC
Bilirubin Urine: NEGATIVE
Glucose, UA: >1000 mg/dL — AB
Hgb urine dipstick: NEGATIVE
Ketones, ur: NEGATIVE mg/dL
Leukocytes, UA: NEGATIVE
Nitrite: NEGATIVE
Protein, ur: NEGATIVE mg/dL
Specific Gravity, Urine: 1.025 (ref 1.005–1.030)
Urobilinogen, UA: 0.2 mg/dL (ref 0.0–1.0)
pH: 6 (ref 5.0–8.0)

## 2010-08-20 LAB — GLUCOSE, CAPILLARY
Glucose-Capillary: 100 mg/dL — ABNORMAL HIGH (ref 70–99)
Glucose-Capillary: 105 mg/dL — ABNORMAL HIGH (ref 70–99)
Glucose-Capillary: 111 mg/dL — ABNORMAL HIGH (ref 70–99)
Glucose-Capillary: 113 mg/dL — ABNORMAL HIGH (ref 70–99)
Glucose-Capillary: 122 mg/dL — ABNORMAL HIGH (ref 70–99)
Glucose-Capillary: 123 mg/dL — ABNORMAL HIGH (ref 70–99)
Glucose-Capillary: 124 mg/dL — ABNORMAL HIGH (ref 70–99)
Glucose-Capillary: 129 mg/dL — ABNORMAL HIGH (ref 70–99)
Glucose-Capillary: 146 mg/dL — ABNORMAL HIGH (ref 70–99)
Glucose-Capillary: 151 mg/dL — ABNORMAL HIGH (ref 70–99)
Glucose-Capillary: 154 mg/dL — ABNORMAL HIGH (ref 70–99)
Glucose-Capillary: 182 mg/dL — ABNORMAL HIGH (ref 70–99)
Glucose-Capillary: 186 mg/dL — ABNORMAL HIGH (ref 70–99)
Glucose-Capillary: 202 mg/dL — ABNORMAL HIGH (ref 70–99)
Glucose-Capillary: 213 mg/dL — ABNORMAL HIGH (ref 70–99)
Glucose-Capillary: 214 mg/dL — ABNORMAL HIGH (ref 70–99)
Glucose-Capillary: 222 mg/dL — ABNORMAL HIGH (ref 70–99)
Glucose-Capillary: 224 mg/dL — ABNORMAL HIGH (ref 70–99)
Glucose-Capillary: 228 mg/dL — ABNORMAL HIGH (ref 70–99)
Glucose-Capillary: 243 mg/dL — ABNORMAL HIGH (ref 70–99)
Glucose-Capillary: 254 mg/dL — ABNORMAL HIGH (ref 70–99)
Glucose-Capillary: 256 mg/dL — ABNORMAL HIGH (ref 70–99)
Glucose-Capillary: 258 mg/dL — ABNORMAL HIGH (ref 70–99)
Glucose-Capillary: 300 mg/dL — ABNORMAL HIGH (ref 70–99)
Glucose-Capillary: 305 mg/dL — ABNORMAL HIGH (ref 70–99)
Glucose-Capillary: 426 mg/dL — ABNORMAL HIGH (ref 70–99)
Glucose-Capillary: 80 mg/dL (ref 70–99)
Glucose-Capillary: 91 mg/dL (ref 70–99)
Glucose-Capillary: 97 mg/dL (ref 70–99)
Glucose-Capillary: >600 mg/dL (ref 70–99)
Glucose-Capillary: >600 mg/dL (ref 70–99)

## 2010-08-20 LAB — CBC
HCT: 29.5 % — ABNORMAL LOW (ref 39.0–52.0)
HCT: 30.5 % — ABNORMAL LOW (ref 39.0–52.0)
HCT: 31 % — ABNORMAL LOW (ref 39.0–52.0)
HCT: 39.8 % (ref 39.0–52.0)
HCT: 40.7 % (ref 39.0–52.0)
Hemoglobin: 10.4 g/dL — ABNORMAL LOW (ref 13.0–17.0)
Hemoglobin: 10.5 g/dL — ABNORMAL LOW (ref 13.0–17.0)
Hemoglobin: 10.8 g/dL — ABNORMAL LOW (ref 13.0–17.0)
Hemoglobin: 13.3 g/dL (ref 13.0–17.0)
Hemoglobin: 14.5 g/dL (ref 13.0–17.0)
MCH: 29.6 pg (ref 26.0–34.0)
MCH: 29.8 pg (ref 26.0–34.0)
MCH: 30 pg (ref 26.0–34.0)
MCH: 30.3 pg (ref 26.0–34.0)
MCH: 30.4 pg (ref 26.0–34.0)
MCHC: 33.4 g/dL (ref 30.0–36.0)
MCHC: 34.4 g/dL (ref 30.0–36.0)
MCHC: 34.8 g/dL (ref 30.0–36.0)
MCHC: 35.3 g/dL (ref 30.0–36.0)
MCHC: 35.6 g/dL (ref 30.0–36.0)
MCV: 84.5 fL (ref 78.0–100.0)
MCV: 85.3 fL (ref 78.0–100.0)
MCV: 85.9 fL (ref 78.0–100.0)
MCV: 87.1 fL (ref 78.0–100.0)
MCV: 89.8 fL (ref 78.0–100.0)
Platelets: 188 10*3/uL (ref 150–400)
Platelets: 192 10*3/uL (ref 150–400)
Platelets: 195 10*3/uL (ref 150–400)
Platelets: 202 10*3/uL (ref 150–400)
Platelets: 216 10*3/uL (ref 150–400)
RBC: 3.49 MIL/uL — ABNORMAL LOW (ref 4.22–5.81)
RBC: 3.55 MIL/uL — ABNORMAL LOW (ref 4.22–5.81)
RBC: 3.56 MIL/uL — ABNORMAL LOW (ref 4.22–5.81)
RBC: 4.43 MIL/uL (ref 4.22–5.81)
RBC: 4.77 MIL/uL (ref 4.22–5.81)
RDW: 12.1 % (ref 11.5–15.5)
RDW: 12.1 % (ref 11.5–15.5)
RDW: 12.4 % (ref 11.5–15.5)
RDW: 12.6 % (ref 11.5–15.5)
RDW: 12.6 % (ref 11.5–15.5)
WBC: 4.8 10*3/uL (ref 4.0–10.5)
WBC: 5.1 10*3/uL (ref 4.0–10.5)
WBC: 5.2 10*3/uL (ref 4.0–10.5)
WBC: 5.5 10*3/uL (ref 4.0–10.5)
WBC: 5.6 10*3/uL (ref 4.0–10.5)

## 2010-08-20 LAB — DIFFERENTIAL
Basophils Absolute: 0 10*3/uL (ref 0.0–0.1)
Basophils Absolute: 0 10*3/uL (ref 0.0–0.1)
Basophils Absolute: 0 10*3/uL (ref 0.0–0.1)
Basophils Relative: 0 % (ref 0–1)
Basophils Relative: 0 % (ref 0–1)
Basophils Relative: 1 % (ref 0–1)
Eosinophils Absolute: 0 10*3/uL (ref 0.0–0.7)
Eosinophils Absolute: 0.1 10*3/uL (ref 0.0–0.7)
Eosinophils Absolute: 0.1 10*3/uL (ref 0.0–0.7)
Eosinophils Relative: 0 % (ref 0–5)
Eosinophils Relative: 1 % (ref 0–5)
Eosinophils Relative: 2 % (ref 0–5)
Lymphocytes Relative: 24 % (ref 12–46)
Lymphocytes Relative: 34 % (ref 12–46)
Lymphocytes Relative: 36 % (ref 12–46)
Lymphs Abs: 1.2 10*3/uL (ref 0.7–4.0)
Lymphs Abs: 1.9 10*3/uL (ref 0.7–4.0)
Lymphs Abs: 2 10*3/uL (ref 0.7–4.0)
Monocytes Absolute: 0.5 10*3/uL (ref 0.1–1.0)
Monocytes Absolute: 0.5 10*3/uL (ref 0.1–1.0)
Monocytes Absolute: 0.6 10*3/uL (ref 0.1–1.0)
Monocytes Relative: 10 % (ref 3–12)
Monocytes Relative: 11 % (ref 3–12)
Monocytes Relative: 9 % (ref 3–12)
Neutro Abs: 2.9 10*3/uL (ref 1.7–7.7)
Neutro Abs: 2.9 10*3/uL (ref 1.7–7.7)
Neutro Abs: 3.4 10*3/uL (ref 1.7–7.7)
Neutrophils Relative %: 51 % (ref 43–77)
Neutrophils Relative %: 53 % (ref 43–77)
Neutrophils Relative %: 67 % (ref 43–77)

## 2010-08-20 LAB — COMPREHENSIVE METABOLIC PANEL
ALT: 10 U/L (ref 0–53)
ALT: 13 U/L (ref 0–53)
ALT: 14 U/L (ref 0–53)
ALT: 23 U/L (ref 0–53)
AST: 15 U/L (ref 0–37)
AST: 17 U/L (ref 0–37)
AST: 17 U/L (ref 0–37)
AST: 22 U/L (ref 0–37)
Albumin: 2.7 g/dL — ABNORMAL LOW (ref 3.5–5.2)
Albumin: 3.2 g/dL — ABNORMAL LOW (ref 3.5–5.2)
Albumin: 3.8 g/dL (ref 3.5–5.2)
Albumin: 4.3 g/dL (ref 3.5–5.2)
Alkaline Phosphatase: 57 U/L (ref 39–117)
Alkaline Phosphatase: 65 U/L (ref 39–117)
Alkaline Phosphatase: 77 U/L (ref 39–117)
Alkaline Phosphatase: 83 U/L (ref 39–117)
BUN: 20 mg/dL (ref 6–23)
BUN: 30 mg/dL — ABNORMAL HIGH (ref 6–23)
BUN: 45 mg/dL — ABNORMAL HIGH (ref 6–23)
BUN: 57 mg/dL — ABNORMAL HIGH (ref 6–23)
CO2: 21 mEq/L (ref 19–32)
CO2: 22 mEq/L (ref 19–32)
CO2: 22 mEq/L (ref 19–32)
CO2: 23 mEq/L (ref 19–32)
Calcium: 10.3 mg/dL (ref 8.4–10.5)
Calcium: 8.2 mg/dL — ABNORMAL LOW (ref 8.4–10.5)
Calcium: 8.6 mg/dL (ref 8.4–10.5)
Calcium: 9 mg/dL (ref 8.4–10.5)
Chloride: 111 mEq/L (ref 96–112)
Chloride: 76 mEq/L — ABNORMAL LOW (ref 96–112)
Chloride: 91 mEq/L — ABNORMAL LOW (ref 96–112)
Chloride: 94 mEq/L — ABNORMAL LOW (ref 96–112)
Creatinine, Ser: 1.08 mg/dL (ref 0.4–1.5)
Creatinine, Ser: 1.21 mg/dL (ref 0.4–1.5)
Creatinine, Ser: 1.52 mg/dL — ABNORMAL HIGH (ref 0.4–1.5)
Creatinine, Ser: 2.08 mg/dL — ABNORMAL HIGH (ref 0.4–1.5)
GFR calc Af Amer: 38 mL/min — ABNORMAL LOW (ref >60)
GFR calc Af Amer: 55 mL/min — ABNORMAL LOW (ref >60)
GFR calc Af Amer: >60 mL/min (ref >60)
GFR calc Af Amer: >60 mL/min (ref >60)
GFR calc non Af Amer: 32 mL/min — ABNORMAL LOW (ref >60)
GFR calc non Af Amer: 46 mL/min — ABNORMAL LOW (ref >60)
GFR calc non Af Amer: 59 mL/min — ABNORMAL LOW (ref >60)
GFR calc non Af Amer: >60 mL/min (ref >60)
Glucose, Bld: 1347 mg/dL (ref 70–99)
Glucose, Bld: 532 mg/dL — ABNORMAL HIGH (ref 70–99)
Glucose, Bld: 616 mg/dL (ref 70–99)
Glucose, Bld: 97 mg/dL (ref 70–99)
Potassium: 3.4 mEq/L — ABNORMAL LOW (ref 3.5–5.1)
Potassium: 3.8 mEq/L (ref 3.5–5.1)
Potassium: 4.1 mEq/L (ref 3.5–5.1)
Potassium: 7.1 mEq/L (ref 3.5–5.1)
Sodium: 108 mEq/L — CL (ref 135–145)
Sodium: 122 mEq/L — ABNORMAL LOW (ref 135–145)
Sodium: 125 mEq/L — ABNORMAL LOW (ref 135–145)
Sodium: 138 mEq/L (ref 135–145)
Total Bilirubin: 0.8 mg/dL (ref 0.3–1.2)
Total Bilirubin: 1 mg/dL (ref 0.3–1.2)
Total Bilirubin: 1 mg/dL (ref 0.3–1.2)
Total Bilirubin: 1.2 mg/dL (ref 0.3–1.2)
Total Protein: 5.5 g/dL — ABNORMAL LOW (ref 6.0–8.3)
Total Protein: 6.7 g/dL (ref 6.0–8.3)
Total Protein: 7.9 g/dL (ref 6.0–8.3)
Total Protein: 8.7 g/dL — ABNORMAL HIGH (ref 6.0–8.3)

## 2010-08-20 LAB — BASIC METABOLIC PANEL
BUN: 13 mg/dL (ref 6–23)
BUN: 16 mg/dL (ref 6–23)
BUN: 20 mg/dL (ref 6–23)
BUN: 26 mg/dL — ABNORMAL HIGH (ref 6–23)
BUN: 36 mg/dL — ABNORMAL HIGH (ref 6–23)
CO2: 19 mEq/L (ref 19–32)
CO2: 22 mEq/L (ref 19–32)
CO2: 23 mEq/L (ref 19–32)
CO2: 24 mEq/L (ref 19–32)
CO2: 25 mEq/L (ref 19–32)
Calcium: 7.9 mg/dL — ABNORMAL LOW (ref 8.4–10.5)
Calcium: 8.4 mg/dL (ref 8.4–10.5)
Calcium: 8.6 mg/dL (ref 8.4–10.5)
Calcium: 9 mg/dL (ref 8.4–10.5)
Calcium: 9.1 mg/dL (ref 8.4–10.5)
Chloride: 103 mEq/L (ref 96–112)
Chloride: 107 mEq/L (ref 96–112)
Chloride: 110 mEq/L (ref 96–112)
Chloride: 110 mEq/L (ref 96–112)
Chloride: 111 mEq/L (ref 96–112)
Creatinine, Ser: 0.94 mg/dL (ref 0.4–1.5)
Creatinine, Ser: 1.01 mg/dL (ref 0.4–1.5)
Creatinine, Ser: 1.09 mg/dL (ref 0.4–1.5)
Creatinine, Ser: 1.09 mg/dL (ref 0.4–1.5)
Creatinine, Ser: 1.31 mg/dL (ref 0.4–1.5)
GFR calc Af Amer: >60 mL/min (ref >60)
GFR calc Af Amer: >60 mL/min (ref >60)
GFR calc Af Amer: >60 mL/min (ref >60)
GFR calc Af Amer: >60 mL/min (ref >60)
GFR calc Af Amer: >60 mL/min (ref >60)
GFR calc non Af Amer: 54 mL/min — ABNORMAL LOW (ref >60)
GFR calc non Af Amer: >60 mL/min (ref >60)
GFR calc non Af Amer: >60 mL/min (ref >60)
GFR calc non Af Amer: >60 mL/min (ref >60)
GFR calc non Af Amer: >60 mL/min (ref >60)
Glucose, Bld: 107 mg/dL — ABNORMAL HIGH (ref 70–99)
Glucose, Bld: 211 mg/dL — ABNORMAL HIGH (ref 70–99)
Glucose, Bld: 225 mg/dL — ABNORMAL HIGH (ref 70–99)
Glucose, Bld: 253 mg/dL — ABNORMAL HIGH (ref 70–99)
Glucose, Bld: 87 mg/dL (ref 70–99)
Potassium: 3.9 mEq/L (ref 3.5–5.1)
Potassium: 3.9 mEq/L (ref 3.5–5.1)
Potassium: 4 mEq/L (ref 3.5–5.1)
Potassium: 4 mEq/L (ref 3.5–5.1)
Potassium: 4.3 mEq/L (ref 3.5–5.1)
Sodium: 131 mEq/L — ABNORMAL LOW (ref 135–145)
Sodium: 134 mEq/L — ABNORMAL LOW (ref 135–145)
Sodium: 136 mEq/L (ref 135–145)
Sodium: 137 mEq/L (ref 135–145)
Sodium: 139 mEq/L (ref 135–145)

## 2010-08-20 LAB — MAGNESIUM
Magnesium: 1.9 mg/dL (ref 1.5–2.5)
Magnesium: 2.3 mg/dL (ref 1.5–2.5)
Magnesium: 2.3 mg/dL (ref 1.5–2.5)
Magnesium: 2.6 mg/dL — ABNORMAL HIGH (ref 1.5–2.5)
Magnesium: 3.1 mg/dL — ABNORMAL HIGH (ref 1.5–2.5)

## 2010-08-20 LAB — URINE MICROSCOPIC-ADD ON

## 2010-08-20 LAB — LIPID PANEL
Cholesterol: 176 mg/dL (ref 0–200)
HDL: 29 mg/dL — ABNORMAL LOW (ref >39)
LDL Cholesterol: 114 mg/dL — ABNORMAL HIGH (ref 0–99)
Total CHOL/HDL Ratio: 6.1 RATIO
Triglycerides: 167 mg/dL — ABNORMAL HIGH (ref <150)
VLDL: 33 mg/dL (ref 0–40)

## 2010-08-20 LAB — PHOSPHORUS
Phosphorus: 1.6 mg/dL — ABNORMAL LOW (ref 2.3–4.6)
Phosphorus: 2 mg/dL — ABNORMAL LOW (ref 2.3–4.6)
Phosphorus: 2.3 mg/dL (ref 2.3–4.6)
Phosphorus: 2.9 mg/dL (ref 2.3–4.6)

## 2010-08-20 LAB — URINE CULTURE
Colony Count: NO GROWTH
Culture  Setup Time: 201108262121
Culture: NO GROWTH

## 2010-08-20 LAB — CARDIAC PANEL(CRET KIN+CKTOT+MB+TROPI)
CK, MB: 1.3 ng/mL (ref 0.3–4.0)
CK, MB: 1.4 ng/mL (ref 0.3–4.0)
Relative Index: 0.6 (ref 0.0–2.5)
Relative Index: 0.6 (ref 0.0–2.5)
Total CK: 207 U/L (ref 7–232)
Total CK: 245 U/L — ABNORMAL HIGH (ref 7–232)
Troponin I: 0.01 ng/mL (ref 0.00–0.06)
Troponin I: 0.01 ng/mL (ref 0.00–0.06)

## 2010-08-20 LAB — CULTURE, BLOOD (ROUTINE X 2)
Culture: NO GROWTH
Culture: NO GROWTH

## 2010-08-20 LAB — CK TOTAL AND CKMB (NOT AT ARMC)
CK, MB: 0.9 ng/mL (ref 0.3–4.0)
Relative Index: 0.8 (ref 0.0–2.5)
Total CK: 120 U/L (ref 7–232)

## 2010-08-20 LAB — TSH
TSH: 0.89 u[IU]/mL (ref 0.350–4.500)
TSH: 1.114 u[IU]/mL (ref 0.350–4.500)

## 2010-08-20 LAB — HEMOGLOBIN A1C
Hgb A1c MFr Bld: 16.4 % — ABNORMAL HIGH (ref <5.7)
Mean Plasma Glucose: 424 mg/dL — ABNORMAL HIGH (ref <117)

## 2010-08-20 LAB — CORTISOL-AM, BLOOD: Cortisol - AM: 9.7 ug/dL (ref 4.3–22.4)

## 2010-08-20 LAB — LACTIC ACID, PLASMA
Lactic Acid, Venous: 0.8 mmol/L (ref 0.5–2.2)
Lactic Acid, Venous: 2.2 mmol/L (ref 0.5–2.2)

## 2010-08-20 LAB — MRSA PCR SCREENING: MRSA by PCR: NEGATIVE

## 2010-08-20 LAB — PROCALCITONIN
Procalcitonin: 0.1 ng/mL
Procalcitonin: <0.1 ng/mL

## 2010-08-20 LAB — MICROALBUMIN, URINE: Microalb, Ur: 0.89 mg/dL (ref 0.00–1.89)

## 2010-08-20 LAB — TROPONIN I: Troponin I: 0.01 ng/mL (ref 0.00–0.06)

## 2010-08-21 ENCOUNTER — Other Ambulatory Visit: Payer: Self-pay | Admitting: Internal Medicine

## 2010-08-21 ENCOUNTER — Encounter (INDEPENDENT_AMBULATORY_CARE_PROVIDER_SITE_OTHER): Payer: Medicare Other

## 2010-08-21 ENCOUNTER — Encounter: Payer: Self-pay | Admitting: Internal Medicine

## 2010-08-21 DIAGNOSIS — E538 Deficiency of other specified B group vitamins: Secondary | ICD-10-CM

## 2010-08-21 MED ORDER — CYANOCOBALAMIN 1000 MCG/ML IJ SOLN: 1000.0000 ug | INTRAMUSCULAR | Status: DC

## 2010-08-23 LAB — COMPREHENSIVE METABOLIC PANEL
ALT: 30 U/L (ref 0–53)
AST: 29 U/L (ref 0–37)
Albumin: 3.3 g/dL — ABNORMAL LOW (ref 3.5–5.2)
Alkaline Phosphatase: 74 U/L (ref 39–117)
BUN: 9 mg/dL (ref 6–23)
CO2: 22 mEq/L (ref 19–32)
Calcium: 9.4 mg/dL (ref 8.4–10.5)
Chloride: 104 mEq/L (ref 96–112)
Creatinine, Ser: 0.87 mg/dL (ref 0.4–1.5)
GFR calc Af Amer: >60 mL/min (ref >60)
GFR calc non Af Amer: >60 mL/min (ref >60)
Glucose, Bld: 169 mg/dL — ABNORMAL HIGH (ref 70–99)
Potassium: 3.7 mEq/L (ref 3.5–5.1)
Sodium: 133 mEq/L — ABNORMAL LOW (ref 135–145)
Total Bilirubin: 0.5 mg/dL (ref 0.3–1.2)
Total Protein: 8.2 g/dL (ref 6.0–8.3)

## 2010-08-23 LAB — BASIC METABOLIC PANEL
BUN: 5 mg/dL — ABNORMAL LOW (ref 6–23)
CO2: 21 mEq/L (ref 19–32)
Calcium: 8.7 mg/dL (ref 8.4–10.5)
Chloride: 105 mEq/L (ref 96–112)
Creatinine, Ser: 0.81 mg/dL (ref 0.4–1.5)
GFR calc Af Amer: >60 mL/min (ref >60)
GFR calc non Af Amer: >60 mL/min (ref >60)
Glucose, Bld: 118 mg/dL — ABNORMAL HIGH (ref 70–99)
Potassium: 3.8 mEq/L (ref 3.5–5.1)
Sodium: 134 mEq/L — ABNORMAL LOW (ref 135–145)

## 2010-08-23 LAB — BRAIN NATRIURETIC PEPTIDE: Pro B Natriuretic peptide (BNP): 83 pg/mL (ref 0.0–100.0)

## 2010-08-23 LAB — HEMOCCULT GUIAC POC 1CARD (OFFICE)
Fecal Occult Bld: NEGATIVE
Fecal Occult Bld: NEGATIVE

## 2010-08-23 LAB — HEMOGLOBINOPATHY EVALUATION
Hemoglobin Other: 0 % (ref 0.0–0.0)
Hgb A2 Quant: 1.9 % — ABNORMAL LOW (ref 2.2–3.2)
Hgb A: 98.1 % — ABNORMAL HIGH (ref 96.8–97.8)
Hgb F Quant: 0 % (ref 0.0–2.0)
Hgb S Quant: 0 % (ref 0.0–0.0)

## 2010-08-23 LAB — CROSSMATCH
ABO/RH(D): O POS
Antibody Screen: NEGATIVE

## 2010-08-23 LAB — CBC
HCT: 19.2 % — ABNORMAL LOW (ref 39.0–52.0)
HCT: 24.3 % — ABNORMAL LOW (ref 39.0–52.0)
Hemoglobin: 5.5 g/dL — CL (ref 13.0–17.0)
Hemoglobin: 7.3 g/dL — ABNORMAL LOW (ref 13.0–17.0)
MCHC: 28.6 g/dL — ABNORMAL LOW (ref 30.0–36.0)
MCHC: 30.1 g/dL (ref 30.0–36.0)
MCV: 59.8 fL — ABNORMAL LOW (ref 78.0–100.0)
MCV: 65.3 fL — ABNORMAL LOW (ref 78.0–100.0)
Platelets: 207 10*3/uL (ref 150–400)
Platelets: 216 10*3/uL (ref 150–400)
RBC: 3.2 MIL/uL — ABNORMAL LOW (ref 4.22–5.81)
RBC: 3.73 MIL/uL — ABNORMAL LOW (ref 4.22–5.81)
RDW: 24.5 % — ABNORMAL HIGH (ref 11.5–15.5)
RDW: 28.8 % — ABNORMAL HIGH (ref 11.5–15.5)
WBC: 5.9 10*3/uL (ref 4.0–10.5)
WBC: 8.5 10*3/uL (ref 4.0–10.5)

## 2010-08-23 LAB — RETICULOCYTES
RBC.: 3.42 MIL/uL — ABNORMAL LOW (ref 4.22–5.81)
Retic Count, Absolute: 61.6 10*3/uL (ref 19.0–186.0)
Retic Ct Pct: 1.8 % (ref 0.4–3.1)

## 2010-08-23 LAB — URINALYSIS, ROUTINE W REFLEX MICROSCOPIC
Bilirubin Urine: NEGATIVE
Glucose, UA: NEGATIVE mg/dL
Hgb urine dipstick: NEGATIVE
Ketones, ur: NEGATIVE mg/dL
Nitrite: NEGATIVE
Protein, ur: NEGATIVE mg/dL
Specific Gravity, Urine: 1.01 (ref 1.005–1.030)
Urobilinogen, UA: 1 mg/dL (ref 0.0–1.0)
pH: 6 (ref 5.0–8.0)

## 2010-08-23 LAB — HEMOGLOBIN A1C
Hgb A1c MFr Bld: 6 % (ref 4.6–6.1)
Mean Plasma Glucose: 126 mg/dL

## 2010-08-23 LAB — DIFFERENTIAL
Basophils Absolute: 0 10*3/uL (ref 0.0–0.1)
Basophils Relative: 0 % (ref 0–1)
Eosinophils Absolute: 0.2 10*3/uL (ref 0.0–0.7)
Eosinophils Relative: 3 % (ref 0–5)
Lymphocytes Relative: 24 % (ref 12–46)
Lymphs Abs: 1.4 10*3/uL (ref 0.7–4.0)
Monocytes Absolute: 0.8 10*3/uL (ref 0.1–1.0)
Monocytes Relative: 14 % — ABNORMAL HIGH (ref 3–12)
Neutro Abs: 3.5 10*3/uL (ref 1.7–7.7)
Neutrophils Relative %: 59 % (ref 43–77)

## 2010-08-23 LAB — IRON AND TIBC
Iron: <10 ug/dL — ABNORMAL LOW (ref 42–135)
UIBC: 392 ug/dL

## 2010-08-23 LAB — POCT CARDIAC MARKERS
CKMB, poc: <1 ng/mL — ABNORMAL LOW (ref 1.0–8.0)
Myoglobin, poc: 58.8 ng/mL (ref 12–200)
Troponin i, poc: <0.05 ng/mL (ref 0.00–0.09)

## 2010-08-23 LAB — PROTIME-INR
INR: 1.09 (ref 0.00–1.49)
Prothrombin Time: 14 seconds (ref 11.6–15.2)

## 2010-08-23 LAB — VITAMIN B12: Vitamin B-12: 214 pg/mL (ref 211–911)

## 2010-08-23 LAB — FOLATE: Folate: 7.2 ng/mL

## 2010-08-23 LAB — LACTATE DEHYDROGENASE: LDH: 104 U/L (ref 94–250)

## 2010-08-23 LAB — CARDIAC PANEL(CRET KIN+CKTOT+MB+TROPI)
CK, MB: 0.6 ng/mL (ref 0.3–4.0)
CK, MB: 0.8 ng/mL (ref 0.3–4.0)
Relative Index: INVALID (ref 0.0–2.5)
Relative Index: INVALID (ref 0.0–2.5)
Total CK: 53 U/L (ref 7–232)
Total CK: 66 U/L (ref 7–232)
Troponin I: 0.01 ng/mL (ref 0.00–0.06)
Troponin I: 0.02 ng/mL (ref 0.00–0.06)

## 2010-08-23 LAB — TECHNOLOGIST SMEAR REVIEW

## 2010-08-23 LAB — METHYLMALONIC ACID, SERUM: Methylmalonic Acid, Quantitative: 82 nmol/L — ABNORMAL LOW (ref 87–318)

## 2010-08-23 LAB — ABO/RH: ABO/RH(D): O POS

## 2010-08-23 LAB — HOMOCYSTEINE: Homocysteine: 16 umol/L — ABNORMAL HIGH (ref 4.0–15.4)

## 2010-08-23 LAB — TSH: TSH: 1.024 u[IU]/mL (ref 0.350–4.500)

## 2010-08-23 LAB — FERRITIN: Ferritin: 5 ng/mL — ABNORMAL LOW (ref 22–322)

## 2010-08-25 NOTE — Assessment & Plan Note (Signed)
Summary: B12 SHOT  Nurse Visit   Allergies: 1)  ! ?infed (Iron Dextran) (Iron Dextran)  Medication Administration  Injection # 1:    Medication: Vit B12 1000 mcg    Diagnosis: B12 DEFICIENCY (ICD-266.2)    Route: IM    Site: R deltoid    Exp Date: 04/2012    Lot #: [id number]    Mfr: American Regent    Patient tolerated injection without complications    Given by: Merri Ray CMA (AAMA) (August 21, 2010 9:12 AM)  Orders Added: 1)  Vit B12 1000 mcg [J3420]

## 2010-08-25 NOTE — Letter (Signed)
Summary: Patient Notice- Colon Biospy Results   Gastroenterology  [address] [place], [place] [address]   Phone: [phone]  Fax: [phone]        August 18, 2010 MRN: [id number]    [name] [address] [place], [place]  [address]    Dear Mr. Caponigro,  I am pleased to inform you that the biopsies taken during your recent colonoscopy did not show any evidence of cancer upon pathologic examination. There was some mild inflammation seen at the site where your colon and small intestine were sewn together.  You should have a repeat colonoscopy examination for polyp follow-up and colon cancer screening in about 3 years (2015).  Please call us if you are having persistent problems or have questions about your condition that have not been fully answered at this time.  Sincerely,  Iva Boop MD, [facility]   This letter has been electronically signed by your physician.  Appended Document: Patient Notice- Colon Biospy Results letter mailed

## 2010-08-26 LAB — CBC
HCT: 31 % — ABNORMAL LOW (ref 39.0–52.0)
HCT: 37.3 % — ABNORMAL LOW (ref 39.0–52.0)
Hemoglobin: 10 g/dL — ABNORMAL LOW (ref 13.0–17.0)
Hemoglobin: 11.9 g/dL — ABNORMAL LOW (ref 13.0–17.0)
MCHC: 31.8 g/dL (ref 30.0–36.0)
MCHC: 32.3 g/dL (ref 30.0–36.0)
MCV: 80.1 fL (ref 78.0–100.0)
MCV: 80.7 fL (ref 78.0–100.0)
Platelets: 269 10*3/uL (ref 150–400)
Platelets: 341 10*3/uL (ref 150–400)
RBC: 3.85 MIL/uL — ABNORMAL LOW (ref 4.22–5.81)
RBC: 4.66 MIL/uL (ref 4.22–5.81)
RDW: 27.7 % — ABNORMAL HIGH (ref 11.5–15.5)
RDW: 29.3 % — ABNORMAL HIGH (ref 11.5–15.5)
WBC: 4.8 10*3/uL (ref 4.0–10.5)
WBC: 6.5 10*3/uL (ref 4.0–10.5)

## 2010-08-26 LAB — COMPREHENSIVE METABOLIC PANEL
ALT: 30 U/L (ref 0–53)
AST: 29 U/L (ref 0–37)
Albumin: 3.5 g/dL (ref 3.5–5.2)
Alkaline Phosphatase: 77 U/L (ref 39–117)
BUN: 4 mg/dL — ABNORMAL LOW (ref 6–23)
CO2: 28 mEq/L (ref 19–32)
Calcium: 9.6 mg/dL (ref 8.4–10.5)
Chloride: 107 mEq/L (ref 96–112)
Creatinine, Ser: 0.81 mg/dL (ref 0.4–1.5)
GFR calc Af Amer: >60 mL/min (ref >60)
GFR calc non Af Amer: >60 mL/min (ref >60)
Glucose, Bld: 104 mg/dL — ABNORMAL HIGH (ref 70–99)
Potassium: 3.7 mEq/L (ref 3.5–5.1)
Sodium: 141 mEq/L (ref 135–145)
Total Bilirubin: 0.6 mg/dL (ref 0.3–1.2)
Total Protein: 8.1 g/dL (ref 6.0–8.3)

## 2010-08-26 LAB — CREATININE, SERUM
Creatinine, Ser: 0.99 mg/dL (ref 0.4–1.5)
GFR calc Af Amer: >60 mL/min (ref >60)
GFR calc non Af Amer: >60 mL/min (ref >60)

## 2010-08-26 LAB — POTASSIUM: Potassium: 4.1 mEq/L (ref 3.5–5.1)

## 2010-08-28 ENCOUNTER — Ambulatory Visit (INDEPENDENT_AMBULATORY_CARE_PROVIDER_SITE_OTHER): Payer: Medicare Other | Admitting: Internal Medicine

## 2010-08-28 DIAGNOSIS — E538 Deficiency of other specified B group vitamins: Secondary | ICD-10-CM

## 2010-08-28 MED ORDER — CYANOCOBALAMIN 1000 MCG/ML IJ SOLN
1000.0000 ug | INTRAMUSCULAR | Status: AC
  Administered 2010-08-28 – 2010-09-04 (×2): 1000 ug via INTRAMUSCULAR

## 2010-08-28 MED ORDER — CYANOCOBALAMIN 1000 MCG/ML IJ SOLN
1000.0000 ug | INTRAMUSCULAR | Status: AC
  Administered 2011-01-04 – 2011-07-07 (×3): 1000 ug via INTRAMUSCULAR

## 2010-08-28 MED ORDER — CYANOCOBALAMIN 1000 MCG/ML IJ SOLN
1000.0000 ug | Freq: Once | INTRAMUSCULAR | Status: AC
  Administered 2010-10-05: 1000 ug via INTRAMUSCULAR

## 2010-09-04 ENCOUNTER — Ambulatory Visit (INDEPENDENT_AMBULATORY_CARE_PROVIDER_SITE_OTHER): Payer: Medicare Other | Admitting: Internal Medicine

## 2010-09-04 DIAGNOSIS — E538 Deficiency of other specified B group vitamins: Secondary | ICD-10-CM

## 2010-10-05 ENCOUNTER — Ambulatory Visit (INDEPENDENT_AMBULATORY_CARE_PROVIDER_SITE_OTHER): Payer: Medicare Other | Admitting: Internal Medicine

## 2010-10-05 DIAGNOSIS — E538 Deficiency of other specified B group vitamins: Secondary | ICD-10-CM

## 2010-10-20 NOTE — Assessment & Plan Note (Signed)
[facility]                             PULMONARY OFFICE NOTE   NAME:BENNETTJarad, Barth                      MRN:          [id number]  DATE:04/12/2007                            DOB:          [birth year]    PROBLEMS:  1. Urticaria.  2. Hypertension.   HISTORY:  Mr. Sutch continues to experience urticarial lesions with no  specific pattern on limbs and trunk. As they resolve, they leave a  lightly hypopigmented area for a while, but gradually returns to normal  skin color. He returns today as planned for allergy testing off of  antihistamines. It is not clear that the Allegra 180 has made a major  difference, although it may help some.   MEDICATIONS:  Listed and reviewed.   OBJECTIVE:  Weight 183 pounds, blood pressure 110/58, pulse 73, room air  saturation 97%. He has 2 or 3 urticarial remnants on exposed skin of  arms.   SKIN TEST:  Positive histamine, negative diluent controls. Significant  positive intradermal reactions for a number of common grass, weed, and  tree pollens, and dust mite.   IMPRESSION:  Atopic status is confirmed by skin testing, consistent with  his IGE level which was elevated at 275. It is harder to tell if this  correlates to his urticaria. We have questioned the role of heat and of  his hydrochlorothiazide, but for now he fits an etiopathic pattern,  chronic.   PLAN:  Labs are being sent for chemistry panel, ANA, and RAST testing  looking to see if we can provide any additional confirmation. Schedule  return 2 months, earlier p.r.n.     Clinton D. Maple Hudson, MD, Tonny Bollman, FACP  Electronically Signed    CDY/MedQ  DD: 04/15/2007  DT: 04/16/2007  Job #: [id number]

## 2010-10-20 NOTE — Assessment & Plan Note (Signed)
[facility]                             PULMONARY OFFICE NOTE   NAME:BENNETTCaelen, Reierson                      MRN:          [id number]  DATE:02/14/2007                            DOB:          1940/11/30    PROBLEM:  1. Urticaria.  2. Hypertension.   HISTORY:  He says he is doing well, showing me a few residual peeling  areas on his wrist. He says June and July were bad, and we again  discussed possible connection to the heat. His IgE level was 275 which  was elevated with 5% eosinophils and a sedimentation rate of 40. He does  not recognize specific triggers.   MEDICATIONS:  1. Flomax.  2. Lipitor.  3. Avodart.  4. Diovan 160/HCT.  5. Allegra 180 mg.   NO MEDICATION ALLERGY.   We discussed his medicines including the hydrochlorothiazide.   OBJECTIVE:  Weight 183 pounds, BP 110/58, pulse 73, room air saturation  97%.  Few peeling areas as noted.   IMPRESSION:  Urticaria. Not sure how much of this is an allergic trigger  and how much of it may reflect heat or complicating medications.   PLAN:  We made need to take him off of his hydrochlorothiazide for a  while. Schedule return for skin testing off of antihistamine.     Clinton D. Maple Hudson, MD, Tonny Bollman, FACP  Electronically Signed    CDY/MedQ  DD: 02/19/2007  DT: 02/20/2007  Job #: [id number]

## 2010-10-23 NOTE — Assessment & Plan Note (Signed)
[facility]                             PULMONARY OFFICE NOTE   NAME:[name], [name]                      MRN:          [id number]  DATE:05/20/2006                            DOB:          [birth year]    PROBLEM:  1. Urticaria.  2. Hypertension.   HISTORY:  He remains on Allegra 180 mg, and with this says his urticaria  has substantially improved.  He still has some itching, but no visible  hives.  He thinks if he stopped the Allegra, the hives would come right  back.   MEDICATIONS:  1. Flomax 0.4 mg x2.  2. Lipitor 20 mg.  3. Avodart.  4. Clarinex 5 mg.  5. Diovan 160/12.5.  6. Allegra 180 mg.   ALLERGIES:  NO MEDICATION ALLERGY.   OBJECTIVE:  Weight 183 pounds.  BP 104/64, pulse regular 67, room air  saturation 98%.  No visible hives.  He seems comfortable.  I do not find  adenopathy.  Pulse is regular, breathing unlabored.   IMPRESSION:  Nonspecific urticaria.  We have not been able to associate  this with a specific medication, although that cannot be ruled out.  Diovan is much less likely to cause problems than would be an  angiotension-converting enzyme inhibitor.   PLAN:  1. He will continue Allegra for now, although he can try taking it on      an alternate day basis as tolerated.  2. Blood work including CBC, sedimentation rate, IgE level, chemistry      panel, TSH.  3. Schedule return 3 months, earlier p.r.n.     Clinton D. Maple Hudson, MD, Tonny Bollman, FACP  Electronically Signed    CDY/MedQ  DD: 05/21/2006  DT: 05/21/2006  Job #: [id number]   cc:   Dr. Valerie Roys.

## 2010-10-23 NOTE — Assessment & Plan Note (Signed)
[facility]                             PULMONARY OFFICE NOTE   NAME:[name], [name]                      MRN:          [id number]  DATE:08/18/2006                            DOB:          Jul 27, 1940    PROBLEM LIST:  1. Urticaria.  2. Hypertension.   HISTORY OF PRESENT ILLNESS:  We had switched him from lisinopril HCT to  Diovan 260 HCT 12.5 mg.  He has been using generic Allegra.  He says  urticaria is not gone, but distinctly better.  It still shifts around  some.  I discussed the possibility the dropping off of Avodart and  Flomax for a little while to see if either of those might be causing  rash.  He feels that both of them helped significantly and we decided to  wait on that trial rather than risk causing problems.  Medications were  reviewed with him as listed.   OBJECTIVE:  VITAL SIGNS:  Weight 183 pounds, BP 94/64, pulse 85, room  air saturations 97%.  No obvious urticaria on exposed skin.   IMPRESSION:  1. Urticaria, idiopathic.  We have not completely excluded a drug      rash, but he is doing better.  2. Low blood pressure on Diovan HCT.   PLAN:  1. Will change is dose to Diovan 80/12.5.  He was originally taken off      of lisinopril because that can cause hives.  Recognize the HCT      component may also be a problem.  He will continue his      fexofenadine.  2. Schedule return in 6 months, but earlier p.r.n.     Clinton D. Maple Hudson, MD, Tonny Bollman, FACP  Electronically Signed    CDY/MedQ  DD: 08/18/2006  DT: 08/19/2006  Job #: [id number]   cc:   Gearlean Alf., M.D.

## 2010-10-23 NOTE — Discharge Summary (Signed)
NAMECAM, DAUPHIN                         ACCOUNT NO.:  [id number]   MEDICAL RECORD NO.:  [id number]                   PATIENT TYPE:  INP   LOCATION:  5529                                 FACILITY:  [facility]   PHYSICIAN:  Dineen Kid. Reche Dixon, M.D.               DATE OF BIRTH:  31-May-1941   DATE OF ADMISSION:  09/08/2002  DATE OF DISCHARGE:  09/10/2002                                 DISCHARGE SUMMARY   CONSULTATIONS:  Fayrene Fearing L. Randa Evens, M.D.   DISCHARGE DIAGNOSES:  1. 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  2. Gastric polyp.  3. Hypertension.  4. Hyperlipidemia.   DISCHARGE MEDICATIONS:  1. Protonix 40 mg p.o. daily.  2. Aspirin 81 mg p.o. daily.  3. Blood pressure, bladder, and hypercholesterolemia medicines as before.   DISPOSITION:  The patient is to be discharged home in good condition.   FOLLOW UP:  He is to follow up with Dr. Bruna Potter on Monday, April 12, at 2  p.m.  Additionally, the patient should follow up with Dr. Randa Evens with  gastroenterology in approximately three weeks.  He is to call to schedule an  appointment at [phone].  Finally, the patient should go to [facility] on Friday, April 9, for a lab draw.   ISSUES AT FOLLOW-UP:  The patient will need his hemoglobin monitored to  ensure that it is staying stable.  As such, he will have a CBC drawn at the  [facility] on Friday, April 9.  Additionally, anemia  studies were obtained while in the hospital, but the results are not  available at this time.  These need to be followed up in the outpatient  setting.   PROCEDURE:  The patient underwent an EGD by Dr. Randa Evens which revealed  evidence of an ulcer lesion in the distal esophagus which was most likely a  Mallory-Weiss tear and an additional 0.5 cm gastric polyp which was biopsied  at the time of the procedure.   HISTORY OF PRESENT ILLNESS:  The patient is a 70 year old African-American  male with past  medical history significant for hypertension, hyperlipidemia,  and gastroesophageal reflux disease, who presented to [facility]  emergency department secondary to hematemesis.  The patient was in his  normal state of health until the morning of admission.  He awoke and ate  some crackers at approximately 9 a.m.  The patient felt nauseated and had  one episode of emesis consisting of that which he had eaten.  Approximately  five minutes later, the patient had more nausea and another episode of  emesis with a small amount of blood at the end of the episode.  The patient  laid down for several hours.  When he awoke, he was feeling quite nauseated,  diaphoretic, and somewhat dizzy.  He therefore went to the restroom, where  he vomited a large amount of blood with clots  in the toilet.  Therefore, the  patient came to the emergency department for further evaluation.   PHYSICAL EXAMINATION:  VITAL SIGNS: Temperature 98.4, pulse 111, blood  pressure 86/57, respiratory rate 20, O2 saturations 97% on room air.  GENERAL:  The patient was nontoxic and in no acute distress.  HEENT:  Sclerae were clear.  Pupils equal, round, and reactive to light.  Extraocular muscles were intact.  Nares were patent. Oropharynx was without  evidence of bleeding.  The mucous membranes were moist.  The NG tube was in  place.  NECK:  Supple without lymphadenopathy.  LUNGS:  Clear to auscultation bilaterally.  HEART:  Regular rate and rhythm without murmurs, rubs, or gallops.  ABDOMEN:  Soft, nontender, and nondistended with normal bowel sounds.  The  NG to wall suction could be heard in the patient's abdomen.  The patient was  heme negative with yellow stool per the ED physician.  EXTREMITIES:  2+ dorsalis pedis pulses.  SKIN:  Dry skin on the upper and lower extremities.  No evidence of  jaundice.  NEUROLOGY:  No focal motor or sensory deficits were appreciated.   LABORATORY DATA:  Admission white blood cell  count 9.0, hemoglobin 11.2,  hematocrit 34.4, platelets 226.  ANC 6.5, MCV 88.6, coags were within normal  limits.  Sodium 138, potassium 4.6, chloride 106, bicarb 26, BUN 26,  creatinine 1.1, glucose 124, total bilirubin 0.6, alkaline phosphatase 43,  AST 20, ALT 18, protein 7.4, albumin 3.6, calcium 9.7.  EKG revealed normal  sinus rhythm at 85 beats per minute with left axis deviation, but normal  otherwise.   HOSPITAL COURSE:  Problem 1.  Upper GI bleed secondary to Mallory-Weiss  tear. The patient was given IV fluid resuscitation in the emergency  department. Additionally, an NG tube was placed which revealed approximately  200 mL of a thin, reddish liquid. The patient was made NPO and treated with  IV proton pump inhibitors.  He was followed with serial H&H checks.  His  hemoglobin did come down somewhat, but overall he remained hemodynamically  stable.  A GI consult was obtained.  They did an EGD which revealed evidence  of an ulcerated lower esophagus with likely Mallory-Weiss tear in addition  to a gastric polyp.  In addition there was a gastric polyp. A biopsy of the  polyp was obtained.  Following the procedure, the patient was able to  tolerate a p.o. diet and did not have any further nausea and vomiting.  His  hemoglobin remained stable.  Therefore, it was felt he was stable to be  discharged home.   Problem 2.  Gastric polyp.  Again, a gastric polyp was noted during the EGD.  This polyp was biopsied and will need to be followed up by Dr. Randa Evens in  approximately three weeks in the outpatient setting.   Problem 3.  Hypertension.  The patient was unsure of his blood pressure  medication upon admission.  He was not markedly hypertensive while in the  hospital.  Therefore his medication was held.  He is to resume taking this  medication on an outpatient setting.   Problem 4.  Hypercholesterolemia.  The patient reported having elevated lipids and being on a lipid medicine at  home, though, he was unsure of which  medication. A lipid panel was checked in the hospital which revealed total  cholesterol of 108, LDL of 71, and HDL of 23.  The patient is to continue  his hypercholesterolemia medicine  in the outpatient setting.   DISCHARGE LABORATORY DATA:  The following available labs on this patient  were drawn on September 10, 2002.  White blood cell count 8.3, hemoglobin 8.4,  hematocrit 26.0, platelets 185.  Sodium 141, potassium 3.9, chloride 113,  bicarb 26, BUN 9, creatinine 0.9, glucose 100, calcium 8.4, total bilirubin  0.7, alkaline phosphatase 2.9, AST 19, ALT 15, protein 6.2, albumin 2.8.  The patient had a fecal occult blood test in the hospital which was negative  for bleed.  The patient has several anemia studies which are pending at this  time.   The patient is instructed to seek immediate medical attention if he begins  experiencing any further vomiting or hematemesis.  Additionally, the patient  is instructed to seek medical attention if he begins feeling increased  fatigue, short of breath, chest pain, or other worrisome symptoms.     Kennith Gain, M.D.                  Dineen Kid Reche Dixon, M.D.    CM/MEDQ  D:  09/10/2002  T:  09/11/2002  Job:  [id number]   cc:   Fayrene Fearing L. Malon Kindle., M.D.  [address]  [place]  [place] [address]  Fax: [phone]   Jarrett Soho., M.D.  [address],  phone [phone]

## 2010-10-23 NOTE — Op Note (Signed)
NAMEGLENWOOD, REVOIR             ACCOUNT NO.:  [id number]   MEDICAL RECORD NO.:  [id number]          PATIENT TYPE:  AMB   LOCATION:  [facility]                         FACILITY:  [facility]   PHYSICIAN:  Lindaann Slough, M.D.  DATE OF BIRTH:  1941-01-14   DATE OF PROCEDURE:  06/11/2005  DATE OF DISCHARGE:                                 OPERATIVE REPORT   PREOPERATIVE DIAGNOSIS:  Left undescended testis.   POSTOPERATIVE DIAGNOSIS:  Left undescended testis.   PROCEDURE:  Left inguinal orchiectomy.   SURGEON:  Danae Chen, M.D.   ANESTHESIA:  General.   INDICATIONS:  The patient is 70 year old male who had been complaining of  left groin discomfort. He was found on physical examination to have left  undescended testis and he is scheduled today for left inguinal orchiectomy.   DESCRIPTION OF PROCEDURE:  Under general anesthesia, the patient was prepped  and draped and placed in the supine position. A transverse incision was made  in the left inguinal crease  The incision was carried down to the  subcutaneous tissues down through the external oblique aponeurosis. The  fascia was then incised towards the external ring. The ilioinguinal nerve  was identified and preserved throughout the course of the procedure. The  cord was then identified and freed from the surrounding tissues. Then the  testicle was found on the floor of the inguinal canal, it was dissected from  the surrounding tissues. The vas was then identified, freed from the  surrounding tissues and ligated with #0 Vicryl  and cut in between  ligatures. Then I dissected the rest of the cord and identified a hernia sac  that was then dissected from the rest of the cord. The hernia sac was then  twisted on itself and doubly ligated with #0 silk and suture ligated with #0  silk at the internal ring. The rest of the cord was then divided in two  segments  and each segment was doubly ligated with #0 Vicryl and suture  ligated with #0  Vicryl. The wound was then irrigated with normal saline.  Then the floor of the inguinal canal was reinforced by approximating the  shelving edge of the Poupart ligament to the edges of the rectus muscle with  #0 Prolene. Then the fascia was approximated with #0 Vicryl, the  subcutaneous tissues were approximated with #3-0 Vicryl and the skin was  closed with #4-0 Monocryl using subcuticular sutures.   The patient tolerated the procedure well and left the OR in satisfactory  condition to post anesthesia care unit.      Lindaann Slough, M.D.  Electronically Signed     MN/MEDQ  D:  06/11/2005  T:  06/11/2005  Job:  [id number]

## 2010-10-23 NOTE — Op Note (Signed)
   [name], [name]                         ACCOUNT NO.:  [id number]   MEDICAL RECORD NO.:  [id number]                   PATIENT TYPE:  INP   LOCATION:  5529                                 FACILITY:  [facility]   PHYSICIAN:  James L. Malon Kindle., M.D.          DATE OF BIRTH:  1940-11-26   DATE OF PROCEDURE:  09/10/2002  DATE OF DISCHARGE:                                 OPERATIVE REPORT   PROCEDURE PERFORMED:  Esophagogastroduodenoscopy and biopsy.   MEDICATIONS:  Demerol 100 mcg, Versed 2 mg IV.   INSTRUMENT USED:  Olympus scope.   ENDOSCOPIST:  Llana Aliment. Randa Evens, M.D.   INDICATIONS FOR PROCEDURE:  The patient has history of gastrointestinal  bleeding.  Had hematemesis after eating.  Has had no further bleeding, no  chronic symptoms.   DESCRIPTION OF PROCEDURE:  The procedure had been explained to the patient  and consent obtained.  With the patient in left lateral decubitus position,  the Olympus scope was inserted and advanced.  The distal esophagus was  reached.  There was a 2 cm hiatal hernia.  Right at the gastroesophageal  junction was an ulcerated area that was swollen and edematous but not  actively bleeding consistent with Mallory-Weiss tear.  The stomach was  entered.  The pylorus identified and passed.  There were no duodenal ulcers.  The pyloric channel was normal.  The antrum was normal.  In the midbody of  the stomach was a 0.5 cm sessile gastric polyp.  I chose to biopsy it rather  than to remove it at this time in order to not create another possible  bleeding site.  The scope was withdrawn.  The fundus and cardia were seen  well.  There was a 2 cm hiatal hernia.  The Mallory-Weiss tear was confirmed  upon withdrawal.  The esophagus was otherwise normal.  The scope was  withdrawn.  The patient tolerated the procedure well.   ASSESSMENT:  1. 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  2. Gastric polyp.    PLAN:  Will continue on Protonix, change to p.o.  Send home when  stable.  Will follow up with the office in three to four weeks.  Depending on the  path report, we may end up going ahead and removing the gastric polyp at  that time.                                               James L. Malon Kindle., M.D.    Waldron Session  D:  09/10/2002  T:  09/10/2002  Job:  [id number]   cc:   Dineen Kid. Reche Dixon, M.D.  [address]  [place] [address]  Fax: [phone]   Dr. Britt Bottom Blunt

## 2010-10-23 NOTE — Consult Note (Signed)
[name], [name]                         ACCOUNT NO.:  [id number]   MEDICAL RECORD NO.:  [id number]                   PATIENT TYPE:  INP   LOCATION:  5529                                 FACILITY:  [facility]   PHYSICIAN:  James L. Malon Kindle., M.D.          DATE OF BIRTH:  1940-06-25   DATE OF CONSULTATION:  09/09/2002  DATE OF DISCHARGE:                                   CONSULTATION   REASON FOR CONSULTATION:  Upper GI bleeding.   HISTORY OF PRESENT ILLNESS:  The patient is a 70 year old white male with no  previous history of GI bleeding who was feeling fine yesterday. He has had  no preceding symptoms of melena, dyspepsia, heart burn, indigestion, etc. He  has not been on any medications.   He felt fine yesterday. He cooked himself some chicken and rice and some  tomato soup. He ate that and felt fine. He is the only one who ate this.  About an hour later he began to vomit. He vomited initially food that he had  eaten. He had another episode of emesis, this time with bright red blood. He  felt weak and dizzy. He had another episode of emesis and this precipitated  his coming to the emergency room.   He continued to feel weak. He feels much better with fluids. He has had no  more vomiting or blood with his stools.   MEDICATIONS ON ADMISSION:  1. Lipitor.  2. Some sort of blood pressure medication.  3. Bladder medicine.  4. Aspirin 1 tablet daily.   ALLERGIES:  No drug allergies.   PAST MEDICAL HISTORY:  1. High cholesterol.  2. Hypertension.  3. He has had a previously placed catheter.   FAMILY HISTORY:  His parents died of unknown causes. His mother was 77. His  father was 56. Hypertension in several family members but no known cancer.  He has 3 healthy children.   SOCIAL HISTORY:  He is divorced. He has a significant other. He works as a  Arboriculturist for [employer]. He used to smoke in the past. He has had no  alcohol for over several years.  He smoked for a  number or years and quit a  couple years ago. He has never used illegal drugs.   PHYSICAL EXAMINATION:  VITAL SIGNS:  Blood pressure 108/68, afebrile, pulse  100.  GENERAL:  An alert black male in no acute distress.  HEENT:  Sclerae anicteric.  NECK:  Supple without lymphadenopathy.  LUNGS:  Clear.  HEART:  Regular rate and rhythm, no murmurs or gallops.  ABDOMEN:  Soft, nontender.   LABORATORY DATA:  On admission the patient had totally normal liver tests.  Slightly elevated BUN of 26 with a creatinine of 1.2. Pro time was normal.  Hemoglobin 11.2.   ASSESSMENT:  Upper gastrointestinal bleed, probably due to ________. Based  on his history I think a quick endoscopy would be  appropriate. He does seem  to currently be stable.   PLAN:  Will proceed with an endoscopy in the early morning. Will continue  him on Protonix an excellent subcategory and allow him to have clear  liquids.                                               James L. Malon Kindle., M.D.    Waldron Session  D:  09/09/2002  T:  09/10/2002  Job:  [id number]

## 2010-10-23 NOTE — Assessment & Plan Note (Signed)
[facility]                               PULMONARY OFFICE NOTE   CHE, RACHAL                      MRN:          [id number]  DATE:03/09/2006                            DOB:          11-24-1940    PULMONARY/ALLERGY CONSULTATION   PROBLEM:  This 70 year old gentleman was referred through the courtesy of  Dr. Clyda Greener in allergy consultation with concern of hives.   HISTORY:  This problem began something like a year ago, more or less.  He  describes crops of pruritic, raised lesions which come at different times  and in different locations.  Episodes tend to last a couple of weeks, and  then fade gradually, leaving scaling areas on his skin for a while until  there is complete resolution.  The current episode has been going on since  late summer.  He has not recognized any pattern relating it to heat,  pressure, food or other activity.  He began lisinopril/hydrochlorothiazide  for his blood pressure something like a year ago.   MEDICATIONS:  1. Flomax 0.5 mg two nightly.  2. Lipitor 20 mg.  3. Avodart.  4. Clarinex 5 mg.  5. Lisinopril/hydrochlorothiazide 20/12.5 mg.   ALLERGIES:  No medication allergy.   REVIEW OF SYSTEMS:  He denies cough, wheeze or discolored sputum, other  rash, adenopathy, fever, chills, sweats or weight loss, abdominal pain,  nausea or vomiting, changes in bowel or bladder.   PAST MEDICAL HISTORY:  1. Hypertension.  2. Elevated cholesterol.  3. Cholecystectomy.  He denies history of liver disease, including hepatitis, or of thyroid  disease.   SOCIAL HISTORY:  Quit smoking 3 years ago, no alcohol.  He is divorced and  retired.   FAMILY HISTORY:  He does not know of anyone with urticaria or similar  allergic problems.   OBJECTIVE:  Weight 170 pounds, BP 102/60, pulse regular 64, room air  saturation 99%.  This is a trim, calm, pleasant African American gentleman.  SKIN:  He shows me some scaling  areas with dry skin flakes on his left flank  and lower abdomen, which he says are the residual of recent hives.  I find  no adenopathy.  There is no active skin inflammation seen, and no neck vein  distention, cyanosis, clubbing or peripheral edema.  Eyes, nose and throat are clear.  Voice quality is normal.  Thyroid is not  enlarged.  CHEST:  Quiet, unlabored breathing without wheeze or rhonchi.  HEART:  Regular rhythm, no murmur heard.  ABDOMEN:  I cannot feet enlargement of liver or spleen.  EXTREMITIES:  Unremarkable, as noted.   IMPRESSION:  Urticaria.  The first thing to think of is an urticarial  reaction to his ACE inhibitor - lisinopril.   PLAN:  1. He is to stop lisinopril, and I have marked that bottle for him.  2. Start Diovan 160 mg/hydrochlorothiazide 12.5 mg.  He is given #30      samples to take 1 daily.  3. Schedule return in 1 month, earlier p.r.n.  I appreciate the chance to  meet this gentleman, and hope that this simple substitution will work      out to take care of him.       Clinton D. Maple Hudson, MD, FCCP, FACP      CDY/MedQ  DD:  03/09/2006  DT:  03/11/2006  Job #:  [id number]   cc:   Charlynne Pander. Dwaine Gale., M.D.

## 2010-10-23 NOTE — Assessment & Plan Note (Signed)
[facility]                               PULMONARY OFFICE NOTE   NAME:BENNETTGurfateh, [name]                      MRN:          [id number]  DATE:04/08/2006                            DOB:          [birth year]    PROBLEM:  Urticaria.   HISTORY:  He has continued to have what sounds like straight forward  urticaria off of lisinopril.  We had switched him to Diovan and he ran out  of that two days ago.  Lesions come up quickly, are usually about 10-15 cm,  quite pruritic and fades slowly over the next 48-72 hours.  He soothes the  area with cocoa butter.   MEDICATIONS:  1. Flomax 0.4 mg x2 two q.h.s.  2. Lipitor 20 mg.  3. Avodart.  4. Clarinex 5 mg.  5. We had given Diovan 160/HCT 12.5 to replace his lisinopril/HCTZ,      thinking the lisinopril component might be causing rash.  We have no      excluded any of his other meds as possibilities.   OBJECTIVE:  Weight 173 pounds, BP 164/80, pulse regular 64, room air  saturation 98%.  There are some resolving hives on the backs of his hands,  no adenopathy, no wheeze.   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  Idiosyncratic  reaction to a medication is not excluded.  We have reviewed possibilities.   PLAN:  1. Stay off of lisinopril.  2. We are refilling Diovan 160/HCT 12.5 to use for blood pressure control.  3. He will follow up on blood pressure management with Dr. Bruna Potter.  4. Try generic Allegra 180 mg daily p.r.n. as an antihistamine.  5. Schedule return in 6 weeks, earlier p.r.n.     Clinton D. Maple Hudson, MD, Tonny Bollman, FACP  Electronically Signed    CDY/MedQ  DD: 04/09/2006  DT: 04/10/2006  Job #: [id number]   cc:   Carney Bern. Bruna Potter, M.D.

## 2011-01-04 ENCOUNTER — Ambulatory Visit (INDEPENDENT_AMBULATORY_CARE_PROVIDER_SITE_OTHER): Payer: Medicare Other | Admitting: Internal Medicine

## 2011-01-04 DIAGNOSIS — E538 Deficiency of other specified B group vitamins: Secondary | ICD-10-CM

## 2011-04-06 ENCOUNTER — Ambulatory Visit (INDEPENDENT_AMBULATORY_CARE_PROVIDER_SITE_OTHER): Payer: Medicare Other | Admitting: Internal Medicine

## 2011-04-06 DIAGNOSIS — E538 Deficiency of other specified B group vitamins: Secondary | ICD-10-CM

## 2011-07-07 ENCOUNTER — Ambulatory Visit (INDEPENDENT_AMBULATORY_CARE_PROVIDER_SITE_OTHER): Payer: Medicare Other | Admitting: Internal Medicine

## 2011-07-07 DIAGNOSIS — E538 Deficiency of other specified B group vitamins: Secondary | ICD-10-CM

## 2011-09-28 ENCOUNTER — Ambulatory Visit (INDEPENDENT_AMBULATORY_CARE_PROVIDER_SITE_OTHER): Payer: Medicare Other | Admitting: Internal Medicine

## 2011-09-28 DIAGNOSIS — E538 Deficiency of other specified B group vitamins: Secondary | ICD-10-CM

## 2011-09-28 MED ORDER — CYANOCOBALAMIN 1000 MCG/ML IJ SOLN
1000.0000 ug | INTRAMUSCULAR | Status: AC
  Administered 2011-09-28 – 2012-03-30 (×3): 1000 ug via INTRAMUSCULAR

## 2011-12-28 ENCOUNTER — Ambulatory Visit (INDEPENDENT_AMBULATORY_CARE_PROVIDER_SITE_OTHER): Payer: Medicare Other | Admitting: Internal Medicine

## 2011-12-28 DIAGNOSIS — E538 Deficiency of other specified B group vitamins: Secondary | ICD-10-CM

## 2012-03-30 ENCOUNTER — Ambulatory Visit (INDEPENDENT_AMBULATORY_CARE_PROVIDER_SITE_OTHER): Payer: Medicare Other | Admitting: Internal Medicine

## 2012-03-30 DIAGNOSIS — E538 Deficiency of other specified B group vitamins: Secondary | ICD-10-CM

## 2012-06-30 ENCOUNTER — Ambulatory Visit (INDEPENDENT_AMBULATORY_CARE_PROVIDER_SITE_OTHER): Payer: Medicare PPO | Admitting: Internal Medicine

## 2012-06-30 ENCOUNTER — Telehealth: Payer: Self-pay

## 2012-06-30 DIAGNOSIS — E538 Deficiency of other specified B group vitamins: Secondary | ICD-10-CM

## 2012-06-30 DIAGNOSIS — D509 Iron deficiency anemia, unspecified: Secondary | ICD-10-CM

## 2012-06-30 MED ORDER — CYANOCOBALAMIN 1000 MCG/ML IJ SOLN
1000.0000 ug | Freq: Once | INTRAMUSCULAR | Status: AC
  Administered 2012-06-30: 1000 ug via INTRAMUSCULAR

## 2012-06-30 NOTE — Telephone Encounter (Signed)
Ask him to get a CBC, ferritin and B12 level  Then we will call with recommendations  Dx is iron-def anemia and B12 def

## 2012-06-30 NOTE — Telephone Encounter (Signed)
Patient came in for Vitamin B12 injection that he gets every 3 months. Patient wanted to know how long he is supposed to continue these injections. The last lab was 2012 for a Vitamin B12. Do you want patient to have new labs drawn or just schedule him for an office visit? His last REV looks like 2011.

## 2012-06-30 NOTE — Telephone Encounter (Signed)
Informed patient to come by and get labs drawn today or Monday and will call him afterwards with recommendations. Pt agreed.

## 2012-07-03 ENCOUNTER — Other Ambulatory Visit (INDEPENDENT_AMBULATORY_CARE_PROVIDER_SITE_OTHER): Payer: Medicare PPO

## 2012-07-03 DIAGNOSIS — D509 Iron deficiency anemia, unspecified: Secondary | ICD-10-CM

## 2012-07-03 DIAGNOSIS — E538 Deficiency of other specified B group vitamins: Secondary | ICD-10-CM

## 2012-07-03 LAB — CBC WITH DIFFERENTIAL/PLATELET
Basophils Absolute: 0 10*3/uL (ref 0.0–0.1)
Basophils Relative: 0.4 % (ref 0.0–3.0)
Eosinophils Absolute: 0.1 10*3/uL (ref 0.0–0.7)
Eosinophils Relative: 2.2 % (ref 0.0–5.0)
HCT: 39.4 % (ref 39.0–52.0)
Hemoglobin: 12.7 g/dL — ABNORMAL LOW (ref 13.0–17.0)
Lymphocytes Relative: 31.4 % (ref 12.0–46.0)
Lymphs Abs: 1.5 10*3/uL (ref 0.7–4.0)
MCHC: 32.3 g/dL (ref 30.0–36.0)
MCV: 90.3 fl (ref 78.0–100.0)
Monocytes Absolute: 0.7 10*3/uL (ref 0.1–1.0)
Monocytes Relative: 13.7 % — ABNORMAL HIGH (ref 3.0–12.0)
Neutro Abs: 2.5 10*3/uL (ref 1.4–7.7)
Neutrophils Relative %: 52.3 % (ref 43.0–77.0)
Platelets: 262 10*3/uL (ref 150.0–400.0)
RBC: 4.37 Mil/uL (ref 4.22–5.81)
RDW: 13.7 % (ref 11.5–14.6)
WBC: 4.8 10*3/uL (ref 4.5–10.5)

## 2012-07-03 LAB — VITAMIN B12: Vitamin B-12: 782 pg/mL (ref 211–911)

## 2012-07-03 LAB — FERRITIN: Ferritin: 39.5 ng/mL (ref 22.0–322.0)

## 2012-07-05 NOTE — Progress Notes (Signed)
Quick Note:  Labs are ok but now iron level on low side (almost low) Have him schedule an REV so I can review what to do re: Tx going forward ______

## 2012-07-10 ENCOUNTER — Encounter: Payer: Self-pay | Admitting: Internal Medicine

## 2012-07-10 ENCOUNTER — Ambulatory Visit (INDEPENDENT_AMBULATORY_CARE_PROVIDER_SITE_OTHER): Payer: Medicare PPO | Admitting: Internal Medicine

## 2012-07-10 VITALS — BP 118/64 | HR 88 | Ht 71.25 in | Wt 180.5 lb

## 2012-07-10 DIAGNOSIS — E538 Deficiency of other specified B group vitamins: Secondary | ICD-10-CM

## 2012-07-10 DIAGNOSIS — D649 Anemia, unspecified: Secondary | ICD-10-CM

## 2012-07-10 MED ORDER — FERROUS SULFATE 325 (65 FE) MG PO TABS: 325.0000 mg | ORAL_TABLET | Freq: Every day | ORAL | Status: DC

## 2012-07-10 NOTE — Patient Instructions (Addendum)
Please get over the counter Ferrous Sulfate 325mg  and take one a day.  Continue B-12 injections every 3 months.  Please get blood work done in 6 months.  No appointment needed.  Our lab is open 8am-5pm.  Thank you for choosing me and [facility].  Iva Boop, M.D., [facility]

## 2012-07-10 NOTE — Progress Notes (Signed)
Subjective:    Patient ID: [name], male    DOB: March 26, 1941, 72 y.o.   MRN: [id number]  HPI [name] returns in followup. He was asking if he needed to stay on chronic parenteral B12 supplementation. He was found to have a B12 deficiency around the time he had a large adenoma in the right colon. He was actually prior to his right hemicolectomy. He also takes metformin chronically. There is also a history of iron deficiency anemia. Recent lab testing as reflected below. He has no GI complaints today. No Known Allergies Outpatient Prescriptions Prior to Visit  Medication Sig Dispense Refill  . B-D UF III MINI PEN NEEDLES 31G X 5 MM MISC       . DIOVAN HCT 160-12.5 MG per tablet Take 1 tablet by mouth daily.       Marland Kitchen glipiZIDE (GLUCOTROL) 10 MG 24 hr tablet Take 10 mg by mouth daily.       Marland Kitchen LANTUS SOLOSTAR 100 UNIT/ML injection Inject 25 Units into the skin at bedtime.       . metFORMIN (GLUCOPHAGE) 500 MG tablet Take 500 mg by mouth 2 (two) times daily with a meal.       . ONE TOUCH ULTRA TEST test strip       . ONETOUCH DELICA LANCETS MISC       . pravastatin (PRAVACHOL) 80 MG tablet Take 80 mg by mouth daily.       . Tamsulosin HCl (FLOMAX) 0.4 MG CAPS Take 0.4 mg by mouth daily.       .         Last reviewed on 07/10/2012 11:22 AM by Iva Boop, MD Past Medical History  Diagnosis Date  . Hypertension   . BPH (benign prostatic hyperplasia)   . Hyperlipidemia   . Mallory-Weiss tear   . Anemia   . Thyroid disorder   . B12 deficiency   . Allergic rhinitis   . 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 Cholecystectomy   . Esophagogastroduodenoscopy   . Colon surgery     right hemi-colectomy   colonoscopy, multiple  Review of Systems As above    Objective:   Physical Exam Well-developed elderly black man in no acute distress   Lab Results  Component Value Date   FERRITIN 39.5 07/03/2012   Lab  Results  Component Value Date   VITAMINB12 782 07/03/2012   Lab Results  Component Value Date   WBC 4.8 07/03/2012   HGB 12.7* 07/03/2012   HCT 39.4 07/03/2012   MCV 90.3 07/03/2012   PLT 262.0 07/03/2012       Assessment & Plan:   1. B12 deficiency   2. Mild chronic anemia with history of iron deficiency, ferritin is now 39    1. Have decided to continue with B12 injection thousand micrograms every 3 months. With his right hemicolectomy and his metformin he is at increased risk of problems. We did discuss a trial of 1000 mcg by mouth in followup in 6 months but he decided to take the injections. 2. Suspect the anemia might be mild chronic disease, we'll supplement his ferritin further and follow up in 6 months including a CBC. 3. He is in a recall program for repeat routine colonoscopy most likely in 2015. Because of a history of a tubulovillous adenoma the right colon. He had a followup exam in 2012 showed no  recurrent  or new polyps.  CC: Burtis Junes, MD

## 2012-09-11 ENCOUNTER — Telehealth: Payer: Self-pay

## 2012-09-11 NOTE — Telephone Encounter (Signed)
Spoke to patient who is due for B12 injection in April and informed him of the national shortage of B12 injectable medicine.  He wrote down to get Vitamin B12 OTC capsules and take one a day as Dr. Leone Payor had said was an option at his last visit.  He verbalized understanding.

## 2012-11-29 NOTE — Progress Notes (Signed)
B12 injection 

## 2013-01-03 ENCOUNTER — Telehealth: Payer: Self-pay

## 2013-01-03 NOTE — Telephone Encounter (Signed)
Message copied by [place], Felissa Blouch E on Wed Jan 03, 2013 10:36 AM ------      Message from: [place], Sherril Shipman E      Created: Mon Jul 10, 2012 11:40 AM       Patient needs lab work done, orders in for CBC/diff, Ferritin in Early Aug. 2014, remind patient ------

## 2013-01-03 NOTE — Telephone Encounter (Signed)
Left detailed message reminding patient to come next week and get labs drawn.

## 2013-01-08 ENCOUNTER — Other Ambulatory Visit (INDEPENDENT_AMBULATORY_CARE_PROVIDER_SITE_OTHER): Payer: Medicare PPO

## 2013-01-08 DIAGNOSIS — D649 Anemia, unspecified: Secondary | ICD-10-CM

## 2013-01-08 DIAGNOSIS — E538 Deficiency of other specified B group vitamins: Secondary | ICD-10-CM

## 2013-01-08 LAB — CBC WITH DIFFERENTIAL/PLATELET
Basophils Absolute: 0 10*3/uL (ref 0.0–0.1)
Basophils Relative: 0.6 % (ref 0.0–3.0)
Eosinophils Absolute: 0.1 10*3/uL (ref 0.0–0.7)
Eosinophils Relative: 3 % (ref 0.0–5.0)
HCT: 40.8 % (ref 39.0–52.0)
Hemoglobin: 13.1 g/dL (ref 13.0–17.0)
Lymphocytes Relative: 30.7 % (ref 12.0–46.0)
Lymphs Abs: 1.4 10*3/uL (ref 0.7–4.0)
MCHC: 32.2 g/dL (ref 30.0–36.0)
MCV: 92.4 fl (ref 78.0–100.0)
Monocytes Absolute: 0.5 10*3/uL (ref 0.1–1.0)
Monocytes Relative: 10.8 % (ref 3.0–12.0)
Neutro Abs: 2.5 10*3/uL (ref 1.4–7.7)
Neutrophils Relative %: 54.9 % (ref 43.0–77.0)
Platelets: 210 10*3/uL (ref 150.0–400.0)
RBC: 4.42 Mil/uL (ref 4.22–5.81)
RDW: 13.7 % (ref 11.5–14.6)
WBC: 4.6 10*3/uL (ref 4.5–10.5)

## 2013-01-08 LAB — FERRITIN: Ferritin: 45.7 ng/mL (ref 22.0–322.0)

## 2013-01-10 ENCOUNTER — Other Ambulatory Visit: Payer: Self-pay

## 2013-01-10 DIAGNOSIS — D649 Anemia, unspecified: Secondary | ICD-10-CM

## 2013-01-10 DIAGNOSIS — E538 Deficiency of other specified B group vitamins: Secondary | ICD-10-CM

## 2013-01-10 NOTE — Progress Notes (Signed)
Quick Note:  Labs ok - cbc and iron Please remind him to stay on ferrous sulfate and B12 by mouth  Repeat cbc, ferritin and also do a B12 level; in 6 months ______

## 2013-07-03 ENCOUNTER — Telehealth: Payer: Self-pay

## 2013-07-03 NOTE — Telephone Encounter (Signed)
Message copied by [place], Tashya Alberty E on Tue Jul 03, 2013 12:31 PM ------      Message from: [place], Elliotte Marsalis E      Created: Wed Jan 10, 2013  3:35 PM       Call remind him labs due early Feb. 2015, orders in. ------

## 2013-07-03 NOTE — Telephone Encounter (Signed)
Spoke to patient and reminded him labs due next week, gave him the lab hours.

## 2013-07-10 ENCOUNTER — Other Ambulatory Visit (INDEPENDENT_AMBULATORY_CARE_PROVIDER_SITE_OTHER): Payer: Medicare PPO

## 2013-07-10 DIAGNOSIS — D649 Anemia, unspecified: Secondary | ICD-10-CM

## 2013-07-10 DIAGNOSIS — E538 Deficiency of other specified B group vitamins: Secondary | ICD-10-CM

## 2013-07-10 LAB — CBC WITH DIFFERENTIAL/PLATELET
Basophils Absolute: 0 10*3/uL (ref 0.0–0.1)
Basophils Relative: 0.6 % (ref 0.0–3.0)
Eosinophils Absolute: 0.2 10*3/uL (ref 0.0–0.7)
Eosinophils Relative: 3.7 % (ref 0.0–5.0)
HCT: 43.2 % (ref 39.0–52.0)
Hemoglobin: 13.8 g/dL (ref 13.0–17.0)
Lymphocytes Relative: 30.9 % (ref 12.0–46.0)
Lymphs Abs: 1.7 10*3/uL (ref 0.7–4.0)
MCHC: 32 g/dL (ref 30.0–36.0)
MCV: 92.6 fl (ref 78.0–100.0)
Monocytes Absolute: 0.6 10*3/uL (ref 0.1–1.0)
Monocytes Relative: 11.1 % (ref 3.0–12.0)
Neutro Abs: 2.9 10*3/uL (ref 1.4–7.7)
Neutrophils Relative %: 53.7 % (ref 43.0–77.0)
Platelets: 208 10*3/uL (ref 150.0–400.0)
RBC: 4.66 Mil/uL (ref 4.22–5.81)
RDW: 14 % (ref 11.5–14.6)
WBC: 5.3 10*3/uL (ref 4.5–10.5)

## 2013-07-10 LAB — VITAMIN B12: Vitamin B-12: 1237 pg/mL — ABNORMAL HIGH (ref 211–911)

## 2013-07-10 LAB — FERRITIN: Ferritin: 56.6 ng/mL (ref 22.0–322.0)

## 2013-07-12 NOTE — Progress Notes (Signed)
Quick Note:  CBC, B12 and iron all ok I suggest he stop B12 injections and take 19000 ug oral B12 daily No other changes ______

## 2013-08-06 ENCOUNTER — Encounter: Payer: Self-pay | Admitting: Internal Medicine

## 2013-08-09 ENCOUNTER — Encounter: Payer: Self-pay | Admitting: Internal Medicine

## 2014-04-12 ENCOUNTER — Emergency Department (HOSPITAL_COMMUNITY)
Admission: EM | Admit: 2014-04-12 | Discharge: 2014-04-12 | Disposition: A | Discharge status: Home / Self Care | Payer: Medicare PPO | Attending: Emergency Medicine | Admitting: Emergency Medicine

## 2014-04-12 ENCOUNTER — Encounter (HOSPITAL_COMMUNITY): Payer: Self-pay | Admitting: Emergency Medicine

## 2014-04-12 DIAGNOSIS — Z86010 Personal history of colonic polyps: Secondary | ICD-10-CM | POA: Insufficient documentation

## 2014-04-12 DIAGNOSIS — D649 Anemia, unspecified: Secondary | ICD-10-CM | POA: Insufficient documentation

## 2014-04-12 DIAGNOSIS — Z87891 Personal history of nicotine dependence: Secondary | ICD-10-CM | POA: Diagnosis not present

## 2014-04-12 DIAGNOSIS — N400 Enlarged prostate without lower urinary tract symptoms: Secondary | ICD-10-CM | POA: Diagnosis not present

## 2014-04-12 DIAGNOSIS — M109 Gout, unspecified: Secondary | ICD-10-CM

## 2014-04-12 DIAGNOSIS — I10 Essential (primary) hypertension: Secondary | ICD-10-CM | POA: Insufficient documentation

## 2014-04-12 DIAGNOSIS — Z794 Long term (current) use of insulin: Secondary | ICD-10-CM | POA: Insufficient documentation

## 2014-04-12 DIAGNOSIS — Z8709 Personal history of other diseases of the respiratory system: Secondary | ICD-10-CM | POA: Diagnosis not present

## 2014-04-12 DIAGNOSIS — M10072 Idiopathic gout, left ankle and foot: Secondary | ICD-10-CM | POA: Diagnosis not present

## 2014-04-12 DIAGNOSIS — Z79899 Other long term (current) drug therapy: Secondary | ICD-10-CM | POA: Insufficient documentation

## 2014-04-12 DIAGNOSIS — M79675 Pain in left toe(s): Secondary | ICD-10-CM | POA: Diagnosis present

## 2014-04-12 DIAGNOSIS — E785 Hyperlipidemia, unspecified: Secondary | ICD-10-CM | POA: Insufficient documentation

## 2014-04-12 DIAGNOSIS — Z8719 Personal history of other diseases of the digestive system: Secondary | ICD-10-CM | POA: Insufficient documentation

## 2014-04-12 DIAGNOSIS — E538 Deficiency of other specified B group vitamins: Secondary | ICD-10-CM | POA: Diagnosis not present

## 2014-04-12 LAB — BASIC METABOLIC PANEL
Anion gap: 15 (ref 5–15)
BUN: 12 mg/dL (ref 6–23)
CO2: 23 mEq/L (ref 19–32)
Calcium: 10.4 mg/dL (ref 8.4–10.5)
Chloride: 99 mEq/L (ref 96–112)
Creatinine, Ser: 1.14 mg/dL (ref 0.50–1.35)
GFR calc Af Amer: 71 mL/min — ABNORMAL LOW (ref >90)
GFR calc non Af Amer: 61 mL/min — ABNORMAL LOW (ref >90)
Glucose, Bld: 300 mg/dL — ABNORMAL HIGH (ref 70–99)
Potassium: 3.8 mEq/L (ref 3.7–5.3)
Sodium: 137 mEq/L (ref 137–147)

## 2014-04-12 MED ORDER — INDOMETHACIN 25 MG PO CAPS
25.0000 mg | ORAL_CAPSULE | Freq: Once | ORAL | Status: AC
  Administered 2014-04-12: 25 mg via ORAL
  Filled 2014-04-12: qty 1

## 2014-04-12 MED ORDER — COLCHICINE 0.6 MG PO TABS: ORAL_TABLET | ORAL | Status: DC

## 2014-04-12 MED ORDER — HYDROCODONE-ACETAMINOPHEN 5-325 MG PO TABS: 1.0000 | ORAL_TABLET | ORAL | Status: DC | PRN

## 2014-04-12 MED ORDER — COLCHICINE 0.6 MG PO TABS
0.6000 mg | ORAL_TABLET | Freq: Once | ORAL | Status: AC
  Administered 2014-04-12: 0.6 mg via ORAL
  Filled 2014-04-12: qty 1

## 2014-04-12 MED ORDER — INDOMETHACIN 25 MG PO CAPS: 25.0000 mg | ORAL_CAPSULE | Freq: Three times a day (TID) | ORAL | Status: DC | PRN

## 2014-04-12 NOTE — Discharge Instructions (Signed)

## 2014-04-12 NOTE — ED Notes (Signed)
Pt reports left foot pain that has been intermittent for 6 months or more. Great Toe is red sore and swollen. Pt ambulatory with difficulty and pain.

## 2014-04-12 NOTE — ED Provider Notes (Signed)
CSN: [id number]     Arrival date & time 04/12/14  6010 History   First MD Initiated Contact with Patient 04/12/14 0940     Chief Complaint  Patient presents with  . Foot Pain      HPI  Patient presents for evaluation of pain and swelling in the left foot pains been intermittent with 3 episodes over the last 6 months. Acutely symptomatically with redness pain and swelling of the left great toe for the last 2 days. Painful the point he could not touch it last night. Walks with a slight limp.  Past Medical History  Diagnosis Date  . Hypertension   . BPH (benign prostatic hyperplasia)     Nesi  . Hyperlipidemia   . Mallory-Weiss tear   . Anemia   . Thyroid disorder   . B12 deficiency   . Allergic rhinitis   . 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 Cholecystectomy    . Esophagogastroduodenoscopy    . Colon surgery      right hemi-colectomy  . Colonoscopy  multiple   Family History  Problem Relation Age of Onset  . Diabetes Other     nephew   History  Substance Use Topics  . Smoking status: Former Smoker    Types: Cigarettes  . Smokeless tobacco: Former Systems developer    Types: Chew  . Alcohol Use: No     Comment: quit    Review of Systems  Constitutional: Negative for fever, chills, diaphoresis, appetite change and fatigue.  HENT: Negative for mouth sores, sore throat and trouble swallowing.   Eyes: Negative for visual disturbance.  Respiratory: Negative for cough, chest tightness, shortness of breath and wheezing.   Cardiovascular: Negative for chest pain.  Gastrointestinal: Negative for nausea, vomiting, abdominal pain, diarrhea and abdominal distention.  Endocrine: Negative for polydipsia, polyphagia and polyuria.  Genitourinary: Negative for dysuria, frequency and hematuria.  Musculoskeletal: Negative for gait problem.       Left toe pain and redness.  Skin: Negative for color change, pallor and rash.    Neurological: Negative for dizziness, syncope, light-headedness and headaches.  Hematological: Does not bruise/bleed easily.  Psychiatric/Behavioral: Negative for behavioral problems and confusion.      Allergies  Review of patient's allergies indicates no known allergies.  Home Medications   Prior to Admission medications   Medication Sig Start Date End Date Taking? Authorizing Provider  B-D UF III MINI PEN NEEDLES 31G X 5 MM MISC  06/15/10   Historical Provider, MD  colchicine 0.6 MG tablet 1 po bid for up to 4 doses with acute attack. May cause diarrhea. 04/12/14   Tanna Furry, MD  DIOVAN HCT 160-12.5 MG per tablet Take 1 tablet by mouth daily.  07/17/10   Historical Provider, MD  ferrous sulfate 325 (65 FE) MG tablet Take 1 tablet (325 mg total) by mouth daily with breakfast. 07/10/12   Gatha Mayer, MD  glipiZIDE (GLUCOTROL) 10 MG 24 hr tablet Take 10 mg by mouth daily.  07/25/10   Historical Provider, MD  HYDROcodone-acetaminophen (NORCO/VICODIN) 5-325 MG per tablet Take 1 tablet by mouth every 4 (four) hours as needed. 04/12/14   Tanna Furry, MD  indomethacin (INDOCIN) 25 MG capsule Take 1 capsule (25 mg total) by mouth 3 (three) times daily as needed. 04/12/14   Tanna Furry, MD  LANTUS SOLOSTAR 100 UNIT/ML injection Inject 25 Units into the skin at bedtime.  07/23/10  Historical Provider, MD  metFORMIN (GLUCOPHAGE) 500 MG tablet Take 500 mg by mouth 2 (two) times daily with a meal.  07/25/10   Historical Provider, MD  ONE TOUCH ULTRA TEST test strip  07/23/10   Historical Provider, MD  Jonetta Speak LANCETS [place]  2/87/86   Historical Provider, MD  pravastatin (PRAVACHOL) 80 MG tablet Take 80 mg by mouth daily.  07/17/10   Historical Provider, MD  Tamsulosin HCl (FLOMAX) 0.4 MG CAPS Take 0.4 mg by mouth daily.  06/10/10   Historical Provider, MD   BP 164/95 mmHg  Pulse 115  Temp(Src) 98.9 F (37.2 C) (Oral)  Resp 16  SpO2 96% Physical Exam  Constitutional: He is oriented to person,  place, and time. He appears well-developed and well-nourished. No distress.  HENT:  Head: Normocephalic.  Eyes: Conjunctivae are normal. Pupils are equal, round, and reactive to light. No scleral icterus.  Neck: Normal range of motion. Neck supple. No thyromegaly present.  Cardiovascular: Normal rate and regular rhythm.  Exam reveals no gallop and no friction rub.   No murmur heard. Pulmonary/Chest: Effort normal and breath sounds normal. No respiratory distress. He has no wheezes. He has no rales.  Abdominal: Soft. Bowel sounds are normal. He exhibits no distension. There is no tenderness. There is no rebound.  Musculoskeletal: Normal range of motion.       Feet:  Neurological: He is alert and oriented to person, place, and time.  Skin: Skin is warm and dry. No rash noted.  Psychiatric: He has a normal mood and affect. His behavior is normal.    ED Course  Procedures (including critical care time) Labs Review Labs Reviewed  BASIC METABOLIC PANEL - Abnormal; Notable for the following:    Glucose, Bld 300 (*)    GFR calc non Af Amer 61 (*)    GFR calc Af Amer 71 (*)    All other components within normal limits    Imaging Review No results found.   EKG Interpretation None      MDM   Final diagnoses:  Acute gout of left foot, unspecified cause    Renal function adequately. Plan indomethacin, close to seen, Vicodin as needed. Primary care follow-up.    Tanna Furry, MD 04/12/14 [id number]

## 2014-06-03 ENCOUNTER — Ambulatory Visit (INDEPENDENT_AMBULATORY_CARE_PROVIDER_SITE_OTHER): Payer: Medicare PPO | Admitting: Family Medicine

## 2014-06-03 ENCOUNTER — Encounter: Payer: Self-pay | Admitting: Family Medicine

## 2014-06-03 VITALS — BP 145/81 | HR 100 | Temp 98.1°F | Ht 74.0 in | Wt 186.7 lb

## 2014-06-03 DIAGNOSIS — E039 Hypothyroidism, unspecified: Secondary | ICD-10-CM

## 2014-06-03 DIAGNOSIS — E119 Type 2 diabetes mellitus without complications: Secondary | ICD-10-CM

## 2014-06-03 DIAGNOSIS — E1165 Type 2 diabetes mellitus with hyperglycemia: Secondary | ICD-10-CM

## 2014-06-03 LAB — POCT GLYCOSYLATED HEMOGLOBIN (HGB A1C): Hemoglobin A1C: 7.1

## 2014-06-03 NOTE — Progress Notes (Signed)
   Subjective:    Patient ID: [name], male    DOB: 1940/09/02, 73 y.o.   MRN: [id number]  HPI  [name] is here to est care.  CHRONIC DIABETES  Disease Monitoring  Blood Sugar Ranges: 77-170  Polyuria: no   Visual problems: no   Medication Compliance: yes  Medication Side Effects  Hypoglycemia: no   Preventitive Health Care  Eye Exam: two cataracts removed two months ago.   Foot Exam: performed today   Diet pattern: unbalanced.   Exercise: walks and rides bicycle.    Current Outpatient Prescriptions on File Prior to Visit  Medication Sig Dispense Refill  . B-D UF III MINI PEN NEEDLES 31G X 5 MM MISC     . colchicine 0.6 MG tablet 1 po bid for up to 4 doses with acute attack. May cause diarrhea. 20 tablet 0  . DIOVAN HCT 160-12.5 MG per tablet Take 1 tablet by mouth daily.     . ferrous sulfate 325 (65 FE) MG tablet Take 1 tablet (325 mg total) by mouth daily with breakfast.  3  . glipiZIDE (GLUCOTROL) 10 MG 24 hr tablet Take 10 mg by mouth daily.     Marland Kitchen HYDROcodone-acetaminophen (NORCO/VICODIN) 5-325 MG per tablet Take 1 tablet by mouth every 4 (four) hours as needed. 10 tablet 0  . indomethacin (INDOCIN) 25 MG capsule Take 1 capsule (25 mg total) by mouth 3 (three) times daily as needed. 30 capsule 0  . LANTUS SOLOSTAR 100 UNIT/ML injection Inject 25 Units into the skin at bedtime.     . metFORMIN (GLUCOPHAGE) 500 MG tablet Take 500 mg by mouth 2 (two) times daily with a meal.     . ONE TOUCH ULTRA TEST test strip     . [place] LANCETS MISC     . pravastatin (PRAVACHOL) 80 MG tablet Take 80 mg by mouth daily.     . Tamsulosin HCl (FLOMAX) 0.4 MG CAPS Take 0.4 mg by mouth daily.      No current facility-administered medications on file prior to visit.    GLO:VFIE smoking and drinking   Health Maintenance: foot exam today    Review of Systems See HPI     Objective:   Physical Exam BP 145/81 mmHg  Pulse 100  Temp(Src) 98.1 F (36.7 C)  (Oral)  Ht 6\' 2"  (1.88 m)  Wt 186 lb 11.2 oz (84.687 kg)  BMI 23.96 kg/m2 Gen: NAD, alert, cooperative with exam, well-appearing HEENT: NCAT, PERRL, clear conjunctiva, CV: tachycardic, regular rhythem, good S1/S2, no murmur, no edema, capillary refill brisk  Resp: CTABL, no wheezes, non-labored Skin: no rashes, normal turgor  Neuro: no gross deficits.  Psych: good insight, alert and oriented     Assessment & Plan:

## 2014-06-03 NOTE — Patient Instructions (Signed)
Thank you for coming in,   We will call you with the results from today.   Please follow up with me in three months.    Please feel free to call with any questions or concerns at any time, at [phone]. --Dr. Raeford Razor  Diet Recommendations for Diabetes   Starchy (carb) foods include: Bread, rice, pasta, potatoes, corn, crackers, bagels, muffins, all baked goods.   Protein foods include: Meat, fish, poultry, eggs, dairy foods, and beans such as pinto and kidney beans (beans also provide carbohydrate).   1. Eat at least 3 meals and 1-2 snacks per day. Never go more than 4-5 hours while awake without eating.  2. Limit starchy foods to TWO per meal and ONE per snack. ONE portion of a starchy  food is equal to the following:   - ONE slice of bread (or its equivalent, such as half of a hamburger bun).   - 1/2 cup of a "scoopable" starchy food such as potatoes or rice.   - 1 OUNCE (28 grams) of starchy snack foods such as crackers or pretzels (look on label).   - 15 grams of carbohydrate as shown on food label.  3. Both lunch and dinner should include a protein food, a carb food, and vegetables.   - Obtain twice as many veg's as protein or carbohydrate foods for both lunch and dinner.   - Try to keep frozen veg's on hand for a quick vegetable serving.     - Fresh or frozen veg's are best.  4. Breakfast should always include protein.

## 2014-06-04 LAB — TSH: TSH: 0.097 u[IU]/mL — ABNORMAL LOW (ref 0.350–4.500)

## 2014-06-05 DIAGNOSIS — E0590 Thyrotoxicosis, unspecified without thyrotoxic crisis or storm: Secondary | ICD-10-CM | POA: Insufficient documentation

## 2014-06-05 DIAGNOSIS — E1149 Type 2 diabetes mellitus with other diabetic neurological complication: Secondary | ICD-10-CM | POA: Insufficient documentation

## 2014-06-05 DIAGNOSIS — E119 Type 2 diabetes mellitus without complications: Secondary | ICD-10-CM | POA: Insufficient documentation

## 2014-06-05 NOTE — Assessment & Plan Note (Signed)
TSH found to be low.  - Free T4 pending.

## 2014-06-05 NOTE — Assessment & Plan Note (Signed)
Well controlled and stable. Hgb A1c 7.1.  - Continue current medications  - f/u in three months.

## 2014-06-10 ENCOUNTER — Telehealth: Payer: Self-pay | Admitting: *Deleted

## 2014-06-10 NOTE — Telephone Encounter (Signed)
Spoke with pt to give him results and instructed him that he needed to come in to have more lab work done due to his TSH level. Pt agreed and he is set to return on 06/12/2014. Katharina Caper, Philena Obey D

## 2014-06-10 NOTE — Telephone Encounter (Signed)
-----   Message from Rosemarie Ax, MD sent at 06/06/2014 12:46 PM EST ----- Please inform that his Hgb A1c is in great range but his TSH is low. We will do another lab test on the previous sample in order to determine if this needs to be treated with medication. Thanks.

## 2014-06-12 ENCOUNTER — Other Ambulatory Visit: Payer: Medicare PPO

## 2014-06-12 LAB — T4, FREE: Free T4: 1.14 ng/dL (ref 0.80–1.80)

## 2014-06-12 NOTE — Progress Notes (Signed)
F-T4 DONE TODAY Chrisma Hurlock

## 2014-06-13 ENCOUNTER — Other Ambulatory Visit: Payer: Self-pay | Admitting: Family Medicine

## 2014-06-13 ENCOUNTER — Telehealth: Payer: Self-pay | Admitting: *Deleted

## 2014-06-13 DIAGNOSIS — E0590 Thyrotoxicosis, unspecified without thyrotoxic crisis or storm: Secondary | ICD-10-CM

## 2014-06-13 NOTE — Telephone Encounter (Signed)
-----   Message from Rosemarie Ax, MD sent at 06/13/2014 11:08 AM EST ----- Please inform that we will need to check his TSh again in 4 weeks. Thanks. I will put in future orders.

## 2014-06-13 NOTE — Telephone Encounter (Signed)
Pt informed. Scheduled him a lab appt for feb 8th. Delray Alt, CMA

## 2014-07-08 ENCOUNTER — Other Ambulatory Visit: Payer: Self-pay | Admitting: Family Medicine

## 2014-07-08 ENCOUNTER — Encounter: Payer: Self-pay | Admitting: Family Medicine

## 2014-07-08 ENCOUNTER — Other Ambulatory Visit: Payer: Medicare PPO

## 2014-07-08 ENCOUNTER — Other Ambulatory Visit: Payer: Self-pay | Admitting: *Deleted

## 2014-07-08 DIAGNOSIS — E0590 Thyrotoxicosis, unspecified without thyrotoxic crisis or storm: Secondary | ICD-10-CM

## 2014-07-08 DIAGNOSIS — E118 Type 2 diabetes mellitus with unspecified complications: Secondary | ICD-10-CM

## 2014-07-08 MED ORDER — ACCU-CHEK SOFTCLIX LANCETS MISC
100.0000 | Freq: Three times a day (TID) | Status: DC
Start: 2014-07-08 — End: 2014-10-10

## 2014-07-08 MED ORDER — INSULIN GLARGINE 100 UNIT/ML SOLOSTAR PEN: 25.0000 [IU] | PEN_INJECTOR | Freq: Every day | SUBCUTANEOUS | Status: DC

## 2014-07-08 NOTE — Telephone Encounter (Signed)
Received ok from MD to fill. [name], Salome Spotted

## 2014-07-08 NOTE — Progress Notes (Unsigned)
° °  Subjective:    Patient ID: [name], male    DOB: [birth year], 74 y.o.   MRN: [id number]  HPI    Review of Systems     Objective:   Physical Exam        Assessment & Plan:

## 2014-07-08 NOTE — Progress Notes (Signed)
Patient is needing a refill of lantus and his strips called in to [facility] on [address].

## 2014-07-08 NOTE — Progress Notes (Signed)
TSH DONE TODAY [name] 

## 2014-07-09 ENCOUNTER — Encounter: Payer: Self-pay | Admitting: Family Medicine

## 2014-07-09 LAB — TSH: TSH: 1.463 u[IU]/mL (ref 0.350–4.500)

## 2014-07-15 ENCOUNTER — Other Ambulatory Visit: Payer: Medicare PPO

## 2014-07-25 ENCOUNTER — Ambulatory Visit: Payer: Medicare PPO | Admitting: Family Medicine

## 2014-08-02 ENCOUNTER — Ambulatory Visit: Payer: Medicare PPO | Admitting: Family Medicine

## 2014-08-02 ENCOUNTER — Encounter: Payer: Self-pay | Admitting: Family Medicine

## 2014-08-02 ENCOUNTER — Ambulatory Visit (INDEPENDENT_AMBULATORY_CARE_PROVIDER_SITE_OTHER): Payer: Medicare PPO | Admitting: Family Medicine

## 2014-08-02 VITALS — BP 149/89 | HR 96 | Temp 98.4°F | Wt 185.6 lb

## 2014-08-02 DIAGNOSIS — E118 Type 2 diabetes mellitus with unspecified complications: Secondary | ICD-10-CM

## 2014-08-02 DIAGNOSIS — I10 Essential (primary) hypertension: Secondary | ICD-10-CM | POA: Diagnosis not present

## 2014-08-02 LAB — LIPID PANEL
Cholesterol: 159 mg/dL (ref 0–200)
HDL: 26 mg/dL — ABNORMAL LOW (ref >=40)
LDL Cholesterol: 104 mg/dL — ABNORMAL HIGH (ref 0–99)
Total CHOL/HDL Ratio: 6.1 Ratio
Triglycerides: 144 mg/dL (ref <150)
VLDL: 29 mg/dL (ref 0–40)

## 2014-08-02 MED ORDER — METFORMIN HCL 500 MG PO TABS: 1000.0000 mg | ORAL_TABLET | Freq: Two times a day (BID) | ORAL | Status: DC

## 2014-08-02 MED ORDER — METFORMIN HCL 500 MG PO TABS: 500.0000 mg | ORAL_TABLET | Freq: Two times a day (BID) | ORAL | Status: DC

## 2014-08-02 MED ORDER — INSULIN PEN NEEDLE 31G X 5 MM MISC
25.0000 [IU] | Freq: Three times a day (TID) | Status: DC
Start: 2014-08-02 — End: 2016-02-12

## 2014-08-02 NOTE — Progress Notes (Signed)
   Subjective:    Patient ID: [name], male    DOB: [birth year], 74 y.o.   MRN: [id number]  HPI  [name] is here for T2DM f/u.   CHRONIC DIABETES  Disease Monitoring  Blood Sugar Ranges: 90-198, although most ranges are controlled except for his 6:00 pm measurement. These range from 130-198. His biggest meal of the day is lunch.   Polyuria: no   Visual problems: no   Medication Compliance: yes  Medication Side Effects  Hypoglycemia: no   Preventitive Health Care  Eye Exam: done   Foot Exam: done   Diet pattern: unsure of what to eat   Exercise: none   HTN Disease Monitoring: has a hx of but not currently being treated.  Home BP Monitoring: none  Chest pain- no    Dyspnea- no Medications:none Compliance-  n/a. Lightheadedness-  no  Edema- no      Current Outpatient Prescriptions on File Prior to Visit  Medication Sig Dispense Refill  . ACCU-CHEK AVIVA PLUS test strip TEST THREE TIMES DAILY (DIAG.CODE 250.01) 100 each 2  . ACCU-CHEK SOFTCLIX LANCETS lancets 100 each by Other route 3 (three) times daily. Use as instructed DX: E11.9 100 each 3  . colchicine 0.6 MG tablet 1 po bid for up to 4 doses with acute attack. May cause diarrhea. 20 tablet 0  . DIOVAN HCT 160-12.5 MG per tablet Take 1 tablet by mouth daily.     . ferrous sulfate 325 (65 FE) MG tablet Take 1 tablet (325 mg total) by mouth daily with breakfast.  3  . glipiZIDE (GLUCOTROL) 10 MG 24 hr tablet Take 10 mg by mouth daily.     Marland Kitchen glucose blood test strip 1 each by Other route as needed for other. Use as instructed    . indomethacin (INDOCIN) 25 MG capsule Take 1 capsule (25 mg total) by mouth 3 (three) times daily as needed. 30 capsule 0  . Insulin Glargine (LANTUS SOLOSTAR) 100 UNIT/ML Solostar Pen Inject 25 Units into the skin at bedtime. 15 mL 3  . ONE TOUCH ULTRA TEST test strip     . pravastatin (PRAVACHOL) 80 MG tablet Take 80 mg by mouth daily.     . Tamsulosin HCl (FLOMAX) 0.4 MG CAPS  Take 0.4 mg by mouth daily.     Marland Kitchen HYDROcodone-acetaminophen (NORCO/VICODIN) 5-325 MG per tablet Take 1 tablet by mouth every 4 (four) hours as needed. (Patient not taking: Reported on 08/02/2014) 10 tablet 0   No current facility-administered medications on file prior to visit.    SHx: doesn't have transportation so has to ride the bus.   Health Maintenance: urine microalbumin today    Review of Systems See HPI     Objective:   Physical Exam BP 149/89 mmHg  Pulse 96  Temp(Src) 98.4 F (36.9 C) (Oral)  Wt 185 lb 9 oz (84.171 kg) Gen: NAD, alert, cooperative with exam, well-appearing HEENT: NCAT, PERRL, clear conjunctiva, oropharynx clear, supple neck CV: RRR, good S1/S2, no murmur, no edema, capillary refill brisk  Resp: CTABL, no wheezes, non-labored Abd: SNTND, BS present, no guarding or organomegaly Skin: no rashes, normal turgor  Neuro: no gross deficits.  Psych: good insight, alert and oriented       Assessment & Plan:

## 2014-08-02 NOTE — Assessment & Plan Note (Signed)
Well controlled. Evening blood sugars are ranging between 150-198. His biggest meal of the day is lunch.  - increased metformin to 1000 mg BID.  - if still high then may add novolog with biggest meal and take off glipizide.  - lipid panel today  - urine microalbumin today.

## 2014-08-02 NOTE — Assessment & Plan Note (Signed)
Mildly elevated. Ask the patient to try to take some home BP measurements between now and f/u. If still elevated would start lisinopril or ARB.

## 2014-08-02 NOTE — Patient Instructions (Signed)
Thank you for coming in,   I increased your metformin to 1000 mg two times a day.   I will call you with the results of today's lab tests.   Please try to follow up with me in 3 months.    Please feel free to call with any questions or concerns at any time, at [phone]. --Dr. Raeford Razor  Diet Recommendations for Diabetes   Starchy (carb) foods include: Bread, rice, pasta, potatoes, corn, crackers, bagels, muffins, all baked goods.   Protein foods include: Meat, fish, poultry, eggs, dairy foods, and beans such as pinto and kidney beans (beans also provide carbohydrate).   1. Eat at least 3 meals and 1-2 snacks per day. Never go more than 4-5 hours while awake without eating.  2. Limit starchy foods to TWO per meal and ONE per snack. ONE portion of a starchy  food is equal to the following:   - ONE slice of bread (or its equivalent, such as half of a hamburger bun).   - 1/2 cup of a "scoopable" starchy food such as potatoes or rice.   - 1 OUNCE (28 grams) of starchy snack foods such as crackers or pretzels (look on label).   - 15 grams of carbohydrate as shown on food label.  3. Both lunch and dinner should include a protein food, a carb food, and vegetables.   - Obtain twice as many veg's as protein or carbohydrate foods for both lunch and dinner.   - Try to keep frozen veg's on hand for a quick vegetable serving.     - Fresh or frozen veg's are best.  4. Breakfast should always include protein.

## 2014-08-03 LAB — MICROALBUMIN, URINE: Microalb, Ur: 0.9 mg/dL (ref <2.0)

## 2014-08-06 ENCOUNTER — Encounter: Payer: Self-pay | Admitting: Family Medicine

## 2014-09-02 ENCOUNTER — Other Ambulatory Visit: Payer: Self-pay | Admitting: *Deleted

## 2014-09-02 DIAGNOSIS — E118 Type 2 diabetes mellitus with unspecified complications: Secondary | ICD-10-CM

## 2014-09-02 DIAGNOSIS — I10 Essential (primary) hypertension: Secondary | ICD-10-CM

## 2014-09-02 DIAGNOSIS — E785 Hyperlipidemia, unspecified: Secondary | ICD-10-CM

## 2014-09-03 MED ORDER — GLIPIZIDE ER 10 MG PO TB24: 10.0000 mg | ORAL_TABLET | Freq: Every day | ORAL | Status: DC

## 2014-09-03 MED ORDER — PRAVASTATIN SODIUM 80 MG PO TABS: 80.0000 mg | ORAL_TABLET | Freq: Every day | ORAL | Status: DC

## 2014-09-03 MED ORDER — VALSARTAN-HYDROCHLOROTHIAZIDE 160-12.5 MG PO TABS: 1.0000 | ORAL_TABLET | Freq: Every day | ORAL | Status: DC

## 2014-09-09 ENCOUNTER — Other Ambulatory Visit: Payer: Self-pay | Admitting: *Deleted

## 2014-09-09 DIAGNOSIS — E118 Type 2 diabetes mellitus with unspecified complications: Secondary | ICD-10-CM

## 2014-09-09 MED ORDER — GLUCOSE BLOOD VI STRP: ORAL_STRIP | Status: DC

## 2014-10-10 ENCOUNTER — Other Ambulatory Visit: Payer: Self-pay | Admitting: Family Medicine

## 2014-11-25 ENCOUNTER — Other Ambulatory Visit: Payer: Self-pay | Admitting: *Deleted

## 2014-11-25 DIAGNOSIS — E118 Type 2 diabetes mellitus with unspecified complications: Secondary | ICD-10-CM

## 2014-11-25 MED ORDER — METFORMIN HCL 500 MG PO TABS: 1000.0000 mg | ORAL_TABLET | Freq: Two times a day (BID) | ORAL | Status: DC

## 2015-01-08 ENCOUNTER — Ambulatory Visit: Payer: Medicare PPO | Admitting: Family Medicine

## 2015-01-09 ENCOUNTER — Ambulatory Visit (INDEPENDENT_AMBULATORY_CARE_PROVIDER_SITE_OTHER): Payer: Medicare PPO | Admitting: Family Medicine

## 2015-01-09 VITALS — BP 140/70 | Temp 98.0°F | Ht 74.0 in | Wt 186.7 lb

## 2015-01-09 DIAGNOSIS — E119 Type 2 diabetes mellitus without complications: Secondary | ICD-10-CM

## 2015-01-09 DIAGNOSIS — E118 Type 2 diabetes mellitus with unspecified complications: Secondary | ICD-10-CM | POA: Diagnosis not present

## 2015-01-09 DIAGNOSIS — I10 Essential (primary) hypertension: Secondary | ICD-10-CM | POA: Diagnosis not present

## 2015-01-09 DIAGNOSIS — G4700 Insomnia, unspecified: Secondary | ICD-10-CM

## 2015-01-09 LAB — POCT GLYCOSYLATED HEMOGLOBIN (HGB A1C): Hemoglobin A1C: 6.8

## 2015-01-09 MED ORDER — GLIPIZIDE ER 10 MG PO TB24: 10.0000 mg | ORAL_TABLET | Freq: Every day | ORAL | Status: DC

## 2015-01-09 MED ORDER — GLUCOSE BLOOD VI STRP: ORAL_STRIP | Status: DC

## 2015-01-09 NOTE — Patient Instructions (Signed)
Thank you for coming in,   I think everything looks good.   Keep up the good work.   Please try a sleep hygiene.  1. Try to go to bed the same time every night.  2. Don't have an electronic device on such as a TV, phone or computer.  3. Don't drink coffee or soda past 3 pm.  4. Avoid taking an afternoon nap.  5. Avoid drinking beverages past 7 pm. Hopefully this will limit the number of times that you have to get up to use the restroom.   Please bring all of your medications with you to each visit.    Please feel free to call with any questions or concerns at any time, at [phone]. --Dr. Raeford Razor

## 2015-01-09 NOTE — Assessment & Plan Note (Addendum)
Controlled  - Continue with current medications

## 2015-01-09 NOTE — Assessment & Plan Note (Signed)
Improvement in A1c today - Continue current medications

## 2015-01-09 NOTE — Assessment & Plan Note (Signed)
Several factors contributing to his inability to stay asleep. He has nocturia, the television on, and poor sleep hygiene - He will try the recommendations that I have provided for him - He will follow-up if there is no improvement in 3-4 weeks. May need to try trazodone  1. Try to go to bed the same time every night.  2. Don't have an electronic device on such as a TV, phone or computer.  3. Don't drink coffee or soda past 3 pm.  4. Avoid taking an afternoon nap.  5. Avoid drinking beverages past 7 pm. Hopefully this will limit the number of times that you have to get up to use the restroom.

## 2015-01-09 NOTE — Progress Notes (Signed)
Subjective:    Normal Recinos - 74 y.o. 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  CHRONIC DIABETES  Disease Monitoring  Blood Sugar Ranges: 89-207. Afternoon readings are all higher on average. Most readings in in the low 100's   Polyuria: no   Visual problems: no   Medication Compliance: yes  Medication Side Effects: lantus, metformin and glipizide.   Hypoglycemia: no   Preventitive Health Care  Eye Exam: has been done   Foot Exam: done previously   Diet pattern: unbalanced.   Exercise: none   HTN Disease Monitoring: Home BP Monitoring 141/80 08/19/14; 149/88 08/05/14, 139/85 09/06/14 Medications:valsartan/HCTZ  Chest pain- no     Dyspnea- no Compliance-  yes.  Lightheadedness-  no   Edema- no   Insomnia: he goes to bed at different times during the night. He takes the a nap during that day.  He is able to fall asleep but he wakes up.  He keeps the tv on throughout the night. He has nocturia about 4-5 x per night.     Health Maintenance:  Health Maintenance Due  Topic Date Due  . TETANUS/TDAP  09/05/1958  . ZOSTAVAX  09/05/1999  . PNA vac Low Risk Adult (1 of 2 - PCV13) 09/04/2004  . HEMOGLOBIN A1C  12/03/2014  . INFLUENZA VACCINE  01/06/2015    -  reports that he has quit smoking. His smoking use included Cigarettes. He has a 30 pack-year smoking history. He has quit using smokeless tobacco. His smokeless tobacco use included Chew. - Review of Systems: Per HPI. All other systems reviewed and are negative. - Past Medical History: Patient Active Problem List   Diagnosis Date Noted  . Diabetes mellitus 06/05/2014  . Hyperthyroidism 06/05/2014  . B12 DEFICIENCY 08/14/2010  . Personal history of colonic polyps 07/07/2009  . ANEMIA-IRON DEFICIENCY 07/07/2009  . Essential hypertension 04/21/2007   - Medications: reviewed and updated Current Outpatient Prescriptions  Medication Sig Dispense Refill  . ACCU-CHEK  SOFTCLIX LANCETS lancets 100 EACH BY OTHER ROUTE 3 (THREE) TIMES DAILY. USE AS INSTRUCTED DX: E11.9 100 each 3  . colchicine 0.6 MG tablet 1 po bid for up to 4 doses with acute attack. May cause diarrhea. 20 tablet 0  . ferrous sulfate 325 (65 FE) MG tablet Take 1 tablet (325 mg total) by mouth daily with breakfast.  3  . glipiZIDE (GLUCOTROL XL) 10 MG 24 hr tablet Take 1 tablet (10 mg total) by mouth daily. 60 tablet 1  . glucose blood (ACCU-CHEK AVIVA PLUS) test strip TEST THREE TIMES DAILY (DIAG.CODE 250.01) 100 each 2  . glucose blood test strip 1 each by Other route as needed for other. Use as instructed    . HYDROcodone-acetaminophen (NORCO/VICODIN) 5-325 MG per tablet Take 1 tablet by mouth every 4 (four) hours as needed. (Patient not taking: Reported on 08/02/2014) 10 tablet 0  . indomethacin (INDOCIN) 25 MG capsule Take 1 capsule (25 mg total) by mouth 3 (three) times daily as needed. 30 capsule 0  . Insulin Glargine (LANTUS SOLOSTAR) 100 UNIT/ML Solostar Pen Inject 25 Units into the skin at bedtime. 15 mL 3  . Insulin Pen Needle (B-D UF III MINI PEN NEEDLES) 31G X 5 MM MISC Inject 25 Units as directed 3 (three) times daily. 100 each 5  . metFORMIN (GLUCOPHAGE) 500 MG tablet Take 2 tablets (1,000 mg total) by mouth 2 (two) times daily with a meal. 60 tablet  3  . ONE TOUCH ULTRA TEST test strip     . pravastatin (PRAVACHOL) 80 MG tablet Take 1 tablet (80 mg total) by mouth daily. 90 tablet 1  . Tamsulosin HCl (FLOMAX) 0.4 MG CAPS Take 0.4 mg by mouth daily.     . valsartan-hydrochlorothiazide (DIOVAN HCT) 160-12.5 MG per tablet Take 1 tablet by mouth daily. 90 tablet 1   No current facility-administered medications for this visit.     Review of Systems See HPI     Objective:   Physical Exam BP 140/70 mmHg  Temp(Src) 98 F (36.7 C) (Oral)  Ht 6\' 2"  (1.88 m)  Wt 186 lb 11.2 oz (84.687 kg)  BMI 23.96 kg/m2 Gen: NAD, alert, cooperative with exam, well-appearing CV: RRR, good  S1/S2, no murmur, no edema Resp: CTABL, no wheezes, non-labored Skin: no rashes, normal turgor  Neuro: no gross deficits.    Assessment & Plan:

## 2015-02-12 ENCOUNTER — Other Ambulatory Visit: Payer: Self-pay | Admitting: *Deleted

## 2015-02-12 DIAGNOSIS — E118 Type 2 diabetes mellitus with unspecified complications: Secondary | ICD-10-CM

## 2015-02-12 MED ORDER — GLIPIZIDE ER 10 MG PO TB24: 10.0000 mg | ORAL_TABLET | Freq: Every day | ORAL | Status: DC

## 2015-02-18 ENCOUNTER — Other Ambulatory Visit: Payer: Self-pay | Admitting: *Deleted

## 2015-02-18 DIAGNOSIS — E118 Type 2 diabetes mellitus with unspecified complications: Secondary | ICD-10-CM

## 2015-02-18 DIAGNOSIS — E785 Hyperlipidemia, unspecified: Secondary | ICD-10-CM

## 2015-02-18 DIAGNOSIS — I10 Essential (primary) hypertension: Secondary | ICD-10-CM

## 2015-02-18 MED ORDER — VALSARTAN-HYDROCHLOROTHIAZIDE 160-12.5 MG PO TABS: 1.0000 | ORAL_TABLET | Freq: Every day | ORAL | Status: DC

## 2015-02-18 MED ORDER — ACCU-CHEK SOFTCLIX LANCETS MISC: Status: DC

## 2015-02-18 MED ORDER — PRAVASTATIN SODIUM 80 MG PO TABS
80.0000 mg | ORAL_TABLET | Freq: Every day | ORAL | Status: DC
Start: 2015-02-18 — End: 2015-09-26

## 2015-02-18 MED ORDER — GLUCOSE BLOOD VI STRP: ORAL_STRIP | Status: DC

## 2015-02-18 MED ORDER — METFORMIN HCL 500 MG PO TABS: 1000.0000 mg | ORAL_TABLET | Freq: Two times a day (BID) | ORAL | Status: DC

## 2015-02-21 DIAGNOSIS — R351 Nocturia: Secondary | ICD-10-CM | POA: Diagnosis not present

## 2015-02-21 DIAGNOSIS — N401 Enlarged prostate with lower urinary tract symptoms: Secondary | ICD-10-CM | POA: Diagnosis not present

## 2015-03-04 ENCOUNTER — Other Ambulatory Visit: Payer: Self-pay | Admitting: Family Medicine

## 2015-03-31 DIAGNOSIS — H43811 Vitreous degeneration, right eye: Secondary | ICD-10-CM | POA: Diagnosis not present

## 2015-03-31 DIAGNOSIS — Z961 Presence of intraocular lens: Secondary | ICD-10-CM | POA: Diagnosis not present

## 2015-03-31 DIAGNOSIS — H26491 Other secondary cataract, right eye: Secondary | ICD-10-CM | POA: Diagnosis not present

## 2015-04-09 DIAGNOSIS — E11319 Type 2 diabetes mellitus with unspecified diabetic retinopathy without macular edema: Secondary | ICD-10-CM | POA: Diagnosis not present

## 2015-04-09 DIAGNOSIS — H18412 Arcus senilis, left eye: Secondary | ICD-10-CM | POA: Diagnosis not present

## 2015-04-09 DIAGNOSIS — H18411 Arcus senilis, right eye: Secondary | ICD-10-CM | POA: Diagnosis not present

## 2015-04-09 DIAGNOSIS — Z961 Presence of intraocular lens: Secondary | ICD-10-CM | POA: Diagnosis not present

## 2015-04-29 ENCOUNTER — Other Ambulatory Visit: Payer: Self-pay | Admitting: *Deleted

## 2015-04-29 MED ORDER — INSULIN GLARGINE 100 UNIT/ML SOLOSTAR PEN: PEN_INJECTOR | SUBCUTANEOUS | Status: DC

## 2015-04-30 ENCOUNTER — Telehealth: Payer: Self-pay | Admitting: *Deleted

## 2015-04-30 MED ORDER — GLUCOSE BLOOD VI STRP
ORAL_STRIP | Status: DC
Start: 2015-04-30 — End: 2017-07-15

## 2015-04-30 MED ORDER — ACCU-CHEK SOFTCLIX LANCETS MISC: Status: DC

## 2015-04-30 NOTE — Telephone Encounter (Signed)
Received a refill request for Accu-chek test strips and lancets for 3 month supply.  DM standardized form completed, signed by Dr. Andria Frames.  Form faxed to [id number].  Derl Barrow, RN

## 2015-05-07 ENCOUNTER — Ambulatory Visit (INDEPENDENT_AMBULATORY_CARE_PROVIDER_SITE_OTHER): Payer: Medicare PPO | Admitting: Family Medicine

## 2015-05-07 ENCOUNTER — Encounter: Payer: Self-pay | Admitting: Family Medicine

## 2015-05-07 VITALS — BP 128/72 | HR 89 | Temp 98.5°F | Ht 74.0 in | Wt 180.0 lb

## 2015-05-07 DIAGNOSIS — E119 Type 2 diabetes mellitus without complications: Secondary | ICD-10-CM

## 2015-05-07 DIAGNOSIS — R6889 Other general symptoms and signs: Secondary | ICD-10-CM

## 2015-05-07 DIAGNOSIS — I10 Essential (primary) hypertension: Secondary | ICD-10-CM | POA: Diagnosis not present

## 2015-05-07 LAB — COMPLETE METABOLIC PANEL WITH GFR
ALT: 20 U/L (ref 9–46)
AST: 19 U/L (ref 10–35)
Albumin: 3.7 g/dL (ref 3.6–5.1)
Alkaline Phosphatase: 40 U/L (ref 40–115)
BUN: 15 mg/dL (ref 7–25)
CO2: 26 mmol/L (ref 20–31)
Calcium: 9.5 mg/dL (ref 8.6–10.3)
Chloride: 102 mmol/L (ref 98–110)
Creat: 0.98 mg/dL (ref 0.70–1.18)
GFR, Est African American: 87 mL/min (ref >=60)
GFR, Est Non African American: 75 mL/min (ref >=60)
Glucose, Bld: 165 mg/dL — ABNORMAL HIGH (ref 65–99)
Potassium: 4 mmol/L (ref 3.5–5.3)
Sodium: 134 mmol/L — ABNORMAL LOW (ref 135–146)
Total Bilirubin: 0.3 mg/dL (ref 0.2–1.2)
Total Protein: 7.6 g/dL (ref 6.1–8.1)

## 2015-05-07 LAB — CBC
HCT: 39.1 % (ref 39.0–52.0)
Hemoglobin: 12.7 g/dL — ABNORMAL LOW (ref 13.0–17.0)
MCH: 28.7 pg (ref 26.0–34.0)
MCHC: 32.5 g/dL (ref 30.0–36.0)
MCV: 88.5 fL (ref 78.0–100.0)
MPV: 9.9 fL (ref 8.6–12.4)
Platelets: 205 10*3/uL (ref 150–400)
RBC: 4.42 MIL/uL (ref 4.22–5.81)
RDW: 13 % (ref 11.5–15.5)
WBC: 5.2 10*3/uL (ref 4.0–10.5)

## 2015-05-07 LAB — POCT GLYCOSYLATED HEMOGLOBIN (HGB A1C): Hemoglobin A1C: 6.6

## 2015-05-07 LAB — TSH: TSH: 1.209 u[IU]/mL (ref 0.350–4.500)

## 2015-05-07 NOTE — Patient Instructions (Signed)
Thank you for coming in,   Go over to [facility] to the first floor. You can show up at anytime. Go to the radiology department.   I will call you with the results.   Please bring all of your medications with you to each visit.   Sign up for My Chart to have easy access to your labs results, and communication with your Primary care physician   Please feel free to call with any questions or concerns at any time, at [phone]. --Dr. Raeford Razor
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
## 2015-05-08 ENCOUNTER — Encounter: Payer: Self-pay | Admitting: Family Medicine

## 2015-05-08 ENCOUNTER — Ambulatory Visit (HOSPITAL_COMMUNITY)
Admission: RE | Admit: 2015-05-08 | Discharge: 2015-05-08 | Disposition: A | Discharge status: Home / Self Care | Payer: Medicare PPO | Source: Ambulatory Visit | Attending: Family Medicine | Admitting: Family Medicine

## 2015-05-08 ENCOUNTER — Telehealth: Payer: Self-pay | Admitting: Family Medicine

## 2015-05-08 DIAGNOSIS — R6889 Other general symptoms and signs: Secondary | ICD-10-CM | POA: Insufficient documentation

## 2015-05-08 DIAGNOSIS — M542 Cervicalgia: Secondary | ICD-10-CM | POA: Diagnosis not present

## 2015-05-08 DIAGNOSIS — M25512 Pain in left shoulder: Secondary | ICD-10-CM | POA: Diagnosis not present

## 2015-05-08 NOTE — Assessment & Plan Note (Signed)
Feels like his neck "pops"  He doesn't have the normal range of motion  Most likely symptoms are related to degenerative changes as he has a normal neuro testing exam  - cervical films.

## 2015-05-08 NOTE — Assessment & Plan Note (Signed)
Controlled - Most elevated blood sugars are at night with his evening meal - Advised to try to cut down his evening meal - Continue current medications - Needs written prescription for diabetic shoes which will be left up front

## 2015-05-08 NOTE — Telephone Encounter (Signed)
Written prescription left up front for diabetic shoes.   Rosemarie Ax, MD PGY-3, [facility] 05/08/2015, 10:10 AM

## 2015-05-08 NOTE — Telephone Encounter (Signed)
LVM for pt to call the office. If pt calls, please inform him of the info below. Sharon T Saunders, CMA  

## 2015-05-12 ENCOUNTER — Telehealth: Payer: Self-pay | Admitting: Family Medicine

## 2015-05-12 NOTE — Telephone Encounter (Signed)
Forms placed in PCP box. Zimmerman Rumple, April D, CMA  

## 2015-05-12 NOTE — Telephone Encounter (Signed)
Pt brought in form from [place] to be completed so he can get his diabetic shoes.  He already has the RX. He will pick up the completed form

## 2015-05-13 ENCOUNTER — Telehealth: Payer: Self-pay | Admitting: Family Medicine

## 2015-05-13 NOTE — Telephone Encounter (Signed)
Spoke with patient about x-ray results. Will monitor for now.  Rosemarie Ax, MD PGY-3, [facility] 05/13/2015, 8:56 AM

## 2015-05-29 DIAGNOSIS — E119 Type 2 diabetes mellitus without complications: Secondary | ICD-10-CM | POA: Diagnosis not present

## 2015-06-03 NOTE — Telephone Encounter (Signed)
Left VM for patient. If he calls back please have him speak with a nurse/CMA and inform that he doesn't qualify for diabetic shoes since he doesn't have any ulcers or skin breakdown.   If any questions then please take the best time and phone number to call and I will try to call him back.   Rosemarie Ax, MD PGY-3, [facility] 06/03/2015, 9:33 AM

## 2015-06-03 NOTE — Telephone Encounter (Signed)
Patient in nurse clinic and informed that he does not qualify for diabetic shoes.  Patient stated she said he should have tow sets of shoes.  He had diabetic shoes last year and it was time for renewal.  The company she used last year went out of business and was sent to [employer].  Patient still does not understand why he does not qualify after getting shoes last year.  Please give him a call at [phone].  Derl Barrow, RN

## 2015-06-05 ENCOUNTER — Ambulatory Visit (INDEPENDENT_AMBULATORY_CARE_PROVIDER_SITE_OTHER): Payer: Medicare PPO | Admitting: Family Medicine

## 2015-06-05 ENCOUNTER — Encounter: Payer: Self-pay | Admitting: Family Medicine

## 2015-06-05 VITALS — BP 116/96 | HR 111 | Temp 97.6°F | Wt 178.6 lb

## 2015-06-05 DIAGNOSIS — E119 Type 2 diabetes mellitus without complications: Secondary | ICD-10-CM | POA: Diagnosis not present

## 2015-06-05 NOTE — Assessment & Plan Note (Signed)
Controlled  Foot exam today  Completed form for diabetic shoes as his foot deformity should qualify  - follow up in three months.

## 2015-06-05 NOTE — Progress Notes (Signed)
Subjective:    [name] - 74 y.o. male MRN [id number]  Date of birth: 07/14/1940  HPI  [name] is here for diabetic shoe evaluation.  CHRONIC DIABETES  Disease Monitoring  Blood Sugar Ranges: 80-190's   Polyuria: no   Visual problems: no   Medication Compliance: yes  Medication Side Effects  Hypoglycemia: no   Preventitive Health Care  Eye Exam: done   Foot Exam: completed today   Diet pattern: low salt diet with supper being his biggest meal   Exercise: walks for exercise    Health Maintenance:  - denied flu vaccine  - reports receiving Tdap within past 10 years.   Health Maintenance Due  Topic Date Due  . TETANUS/TDAP  09/05/1958  . ZOSTAVAX  09/05/1999  . PNA vac Low Risk Adult (1 of 2 - PCV13) 09/04/2004  . OPHTHALMOLOGY EXAM  05/28/2015  . FOOT EXAM  06/04/2015    -  reports that he has quit smoking. His smoking use included Cigarettes. He has a 30 pack-year smoking history. He has quit using smokeless tobacco. His smokeless tobacco use included Chew. - Review of Systems: Per HPI. - Past Medical History: Patient Active Problem List   Diagnosis Date Noted  . Neck problem 05/08/2015  . Insomnia 01/09/2015  . Diabetes mellitus ([place]) 06/05/2014  . Hyperthyroidism 06/05/2014  . B12 DEFICIENCY 08/14/2010  . Personal history of colonic polyps 07/07/2009  . ANEMIA-IRON DEFICIENCY 07/07/2009  . Essential hypertension 04/21/2007   - Medications: reviewed and updated Current Outpatient Prescriptions  Medication Sig Dispense Refill  . ACCU-CHEK SOFTCLIX LANCETS lancets 100 EACH BY OTHER ROUTE 3 (THREE) TIMES DAILY. USE AS INSTRUCTED DX: E11.9 300 each 3  . colchicine 0.6 MG tablet 1 po bid for up to 4 doses with acute attack. May cause diarrhea. 20 tablet 0  . ferrous sulfate 325 (65 FE) MG tablet Take 1 tablet (325 mg total) by mouth daily with breakfast.  3  . glipiZIDE (GLUCOTROL XL) 10 MG 24 hr tablet Take 1 tablet (10 mg total) by mouth daily.  90 tablet 1  . glucose blood (ACCU-CHEK AVIVA PLUS) test strip TEST THREE TIMES DAILY (DIAG.CODE 250.01) 300 each 2  . glucose blood test strip 1 each by Other route as needed for other. Use as instructed    . HYDROcodone-acetaminophen (NORCO/VICODIN) 5-325 MG per tablet Take 1 tablet by mouth every 4 (four) hours as needed. (Patient not taking: Reported on 08/02/2014) 10 tablet 0  . indomethacin (INDOCIN) 25 MG capsule Take 1 capsule (25 mg total) by mouth 3 (three) times daily as needed. 30 capsule 0  . Insulin Glargine (LANTUS SOLOSTAR) 100 UNIT/ML Solostar Pen INJECT 25 UNITS INTO THE SKIN AT BEDTIME. 15 pen 0  . Insulin Pen Needle (B-D UF III MINI PEN NEEDLES) 31G X 5 MM MISC Inject 25 Units as directed 3 (three) times daily. 100 each 5  . metFORMIN (GLUCOPHAGE) 500 MG tablet Take 2 tablets (1,000 mg total) by mouth 2 (two) times daily with a meal. 60 tablet 3  . ONE TOUCH ULTRA TEST test strip     . pravastatin (PRAVACHOL) 80 MG tablet Take 1 tablet (80 mg total) by mouth daily. 90 tablet 1  . Tamsulosin HCl (FLOMAX) 0.4 MG CAPS Take 0.4 mg by mouth daily.     . valsartan-hydrochlorothiazide (DIOVAN HCT) 160-12.5 MG per tablet Take 1 tablet by mouth daily. 90 tablet 1   No current facility-administered medications for this visit.  Review of Systems See HPI     Objective:   Physical Exam BP 116/96 mmHg  Pulse 111  Temp(Src) 97.6 F (36.4 C) (Oral)  Wt 178 lb 9.6 oz (81.012 kg) Gen: NAD, alert, cooperative with exam, ambulating with cane  Resp:  non-labored Skin: no rashes, normal turgor  Neuro: no gross deficits.  Psych: alert and oriented Foot Exam:  Laterality: bilateral  Appearance: severe Bilateral hallux valgus, no callus formation on plantar aspect ,  Palpation: sensation is intact with +1 pulses bilaterally Gait: Slight limp and ambulating with cane Edema: None  Range of Motion: Active and passive range of motion Neurovascularly intact: Yes Arch: Mild pes planus  bilaterally Strength:  Dorsiflexion: 5/5 Plantarflexion: 5/5 Inversion: 5/5 Eversion: 5/5      Assessment & Plan:   Diabetes mellitus Controlled  Foot exam today  Completed form for diabetic shoes as his foot deformity should qualify  - follow up in three months.

## 2015-06-05 NOTE — Patient Instructions (Signed)
Thank you for coming in,   Let me know if you don't hear from [employer] about your shoes.   Please bring all of your medications with you to each visit.   Sign up for My Chart to have easy access to your labs results, and communication with your Primary care physician   Please feel free to call with any questions or concerns at any time, at [phone]. --Dr. Raeford Razor

## 2015-06-30 ENCOUNTER — Other Ambulatory Visit: Payer: Self-pay | Admitting: *Deleted

## 2015-06-30 MED ORDER — INSULIN GLARGINE 100 UNIT/ML SOLOSTAR PEN: PEN_INJECTOR | SUBCUTANEOUS | Status: DC

## 2015-07-29 ENCOUNTER — Other Ambulatory Visit: Payer: Self-pay | Admitting: *Deleted

## 2015-07-29 MED ORDER — METFORMIN HCL 500 MG PO TABS: 1000.0000 mg | ORAL_TABLET | Freq: Two times a day (BID) | ORAL | Status: DC

## 2015-08-15 ENCOUNTER — Other Ambulatory Visit: Payer: Self-pay | Admitting: *Deleted

## 2015-08-15 MED ORDER — GLIPIZIDE ER 10 MG PO TB24: 10.0000 mg | ORAL_TABLET | Freq: Every day | ORAL | Status: DC

## 2015-08-25 ENCOUNTER — Other Ambulatory Visit: Payer: Self-pay | Admitting: *Deleted

## 2015-08-25 MED ORDER — METFORMIN HCL 500 MG PO TABS: 1000.0000 mg | ORAL_TABLET | Freq: Two times a day (BID) | ORAL | Status: DC

## 2015-08-25 NOTE — Telephone Encounter (Signed)
Request for 90 day supply. Yardley Beltran L, RN  

## 2015-09-01 ENCOUNTER — Other Ambulatory Visit: Payer: Self-pay | Admitting: *Deleted

## 2015-09-02 MED ORDER — INSULIN GLARGINE 100 UNIT/ML SOLOSTAR PEN: PEN_INJECTOR | SUBCUTANEOUS | Status: DC

## 2015-09-26 ENCOUNTER — Other Ambulatory Visit: Payer: Self-pay | Admitting: *Deleted

## 2015-09-26 DIAGNOSIS — E785 Hyperlipidemia, unspecified: Secondary | ICD-10-CM

## 2015-09-26 DIAGNOSIS — I10 Essential (primary) hypertension: Secondary | ICD-10-CM

## 2015-09-29 MED ORDER — PRAVASTATIN SODIUM 80 MG PO TABS: 80.0000 mg | ORAL_TABLET | Freq: Every day | ORAL | Status: DC

## 2015-09-29 MED ORDER — VALSARTAN-HYDROCHLOROTHIAZIDE 160-12.5 MG PO TABS: 1.0000 | ORAL_TABLET | Freq: Every day | ORAL | Status: DC

## 2015-10-07 ENCOUNTER — Encounter: Payer: Self-pay | Admitting: Internal Medicine

## 2015-10-13 ENCOUNTER — Encounter: Payer: Self-pay | Admitting: Internal Medicine

## 2015-10-21 ENCOUNTER — Ambulatory Visit (INDEPENDENT_AMBULATORY_CARE_PROVIDER_SITE_OTHER): Payer: Medicare Other | Admitting: Family Medicine

## 2015-10-21 ENCOUNTER — Encounter: Payer: Self-pay | Admitting: Family Medicine

## 2015-10-21 VITALS — BP 112/63 | HR 78 | Temp 98.1°F | Ht 74.0 in | Wt 174.0 lb

## 2015-10-21 DIAGNOSIS — K635 Polyp of colon: Secondary | ICD-10-CM | POA: Diagnosis not present

## 2015-10-21 DIAGNOSIS — Z0000 Encounter for general adult medical examination without abnormal findings: Secondary | ICD-10-CM | POA: Diagnosis not present

## 2015-10-21 DIAGNOSIS — E119 Type 2 diabetes mellitus without complications: Secondary | ICD-10-CM | POA: Diagnosis not present

## 2015-10-21 DIAGNOSIS — D126 Benign neoplasm of colon, unspecified: Secondary | ICD-10-CM

## 2015-10-21 LAB — POCT GLYCOSYLATED HEMOGLOBIN (HGB A1C): Hemoglobin A1C: 6.2

## 2015-10-21 NOTE — Patient Instructions (Signed)
Thank you for coming in,   Please let me know if your blood sugar is low.   Please let me know if you need a referral for you colonoscopy.   Please bring all of your medications with you to each visit.   Health maintenance items that are due.  Health Maintenance  Topic Date Due  . Tetanus Vaccine  09/05/1958  . Shingles Vaccine  09/05/1999  . Pneumonia vaccines (1 of 2 - PCV13) 09/04/2004  . Eye exam for diabetics  05/28/2015  . Hemoglobin A1C  11/04/2015  . Flu Shot  01/06/2016  . Complete foot exam   06/04/2016     Sign up for My Chart to have easy access to your labs results, and communication with your Primary care physician   Please feel free to call with any questions or concerns at any time, at [phone]. --Dr. Raeford Razor

## 2015-10-21 NOTE — Progress Notes (Signed)
   Subjective:    [name] - 75 y.o. 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  History of tubulovillous adenoma and right hemicolectomy  Denies any diarrhea.  Sometimes he has some hard stools  Denies any blood  Denies losing weight and has lost 4 pounds in 5 months.  He has a regular appetite and eats well.  He is scheduled in June 6 for colonoscopy.   CHRONIC DIABETES Has been diagnosed about 3 years ago.  Disease Monitoring Blood Sugar Ranges: 80-190's  Polyuria: no  Visual problems: no   Medication Compliance: yes  Medication Side Effects Hypoglycemia: no   Preventitive Health Care Eye Exam: done  Foot Exam: completed today  Diet pattern: low salt diet with supper being his biggest meal  Exercise: walks for exercise   Had the discussion about PSA and prostate cancer but he elected to not to check today.   FH: no colon cancer  SH: quit smoking and drinking 30 years ago.  PMH: anemia, HTN, DM2  Health Maintenance:  - denies Tdap, shingles vaccine and PNA vac today  - had his eyes checked three months ago.  Health Maintenance Due  Topic Date Due  . TETANUS/TDAP  09/05/1958  . ZOSTAVAX  09/05/1999  . PNA vac Low Risk Adult (1 of 2 - PCV13) 09/04/2004  . OPHTHALMOLOGY EXAM  05/28/2015    Review of Systems See HPI     Objective:   Physical Exam BP 112/63 mmHg  Pulse 78  Temp(Src) 98.1 F (36.7 C) (Oral)  Ht 6\' 2"  (1.88 m)  Wt 174 lb (78.926 kg)  BMI 22.33 kg/m2 Gen: NAD, alert, cooperative with exam, CV: RRR, good S1/S2, no murmur, no edema,  Resp: CTABL, no wheezes, non-labored Skin: no rashes, normal turgor  Neuro: no gross deficits.      Assessment & Plan:   Diabetes mellitus Morning blood sugars ranges from 98 to 161  Denies any hypoglycemia and was counseled on what to look for.  -  discussed decreasing his insulin but he wants to stay at his current dose.  - in follow up can consider decreasing insulin to 20 U or even 10 U - f/u in 6 months.   Tubulovillous adenoma of colon He reports having a colonoscopy scheduled  He will call if I need to make a formal referral to GI.   Health care maintenance Denies vaccines.  Discussed PSA today and he elected not to.

## 2015-10-22 DIAGNOSIS — Z7189 Other specified counseling: Secondary | ICD-10-CM | POA: Insufficient documentation

## 2015-10-22 DIAGNOSIS — Z0000 Encounter for general adult medical examination without abnormal findings: Secondary | ICD-10-CM | POA: Insufficient documentation

## 2015-10-22 NOTE — Assessment & Plan Note (Signed)
He reports having a colonoscopy scheduled  He will call if I need to make a formal referral to GI.

## 2015-10-22 NOTE — Assessment & Plan Note (Signed)
Denies vaccines.  Discussed PSA today and he elected not to.

## 2015-10-22 NOTE — Assessment & Plan Note (Signed)
Morning blood sugars ranges from 98 to 161  Denies any hypoglycemia and was counseled on what to look for.  - discussed decreasing his insulin but he wants to stay at his current dose.  - in follow up can consider decreasing insulin to 20 U or even 10 U - f/u in 6 months.

## 2015-11-04 ENCOUNTER — Telehealth: Payer: Self-pay | Admitting: Family Medicine

## 2015-11-04 NOTE — Telephone Encounter (Signed)
Pt came by top let the doctor know that his blood levels were low on the 28 and 29 in the low 70's. Please call patient if he needs to do something or you need him to come in. jw

## 2015-11-04 NOTE — Telephone Encounter (Signed)
Left VM for patient. If he calls back please have him speak with a nurse/CMA and ask if he is referring to his blood sugar being that low. If he is having any symptoms from his blood sugar being low, then you will need to eat or drink something to raise it such as orange juice. He will need to decrease the amount of insulin he is giving himself. He should drop it to around 20 units.He may need to bring his meter in to the clinic to check and make sure that it is reading accurately.   If any questions then please take the best time and phone number to call and I will try to call him back.   Rosemarie Ax, MD PGY-3, [facility] 11/04/2015, 2:41 PM

## 2015-11-05 ENCOUNTER — Ambulatory Visit (INDEPENDENT_AMBULATORY_CARE_PROVIDER_SITE_OTHER): Payer: Medicare Other | Admitting: *Deleted

## 2015-11-05 DIAGNOSIS — E119 Type 2 diabetes mellitus without complications: Secondary | ICD-10-CM

## 2015-11-05 NOTE — Progress Notes (Signed)
   Patient in nurse clinic to discuss his blood glucose levels.  Patient stated that his blood sugar dropped two days in row on Sunday 5/28/207 it was 66 and on Monday 11/03/2015 it was 68.  Patient denies any symptoms.  Patient is taking his blood sugars three times a day as ordered.  He is taking his metformin and Lantus as prescribed.  Advised patient that his PCP stated patient could drop his Lantus down to 20 Units from 25 Units.  Patient stated he know what to eat/drink when is blood sugar drops.  Patient requested to restart his metformin 1 tablet twice daily. Patient felt that his blood sugar was stabilized with taking his metformin 1 tab twice daily.  Patient stated that this was the first episode he had with low blood sugar.  Blood sugar today was 81 or 86 per patient.  Taken around 7 AM.  Will forward to PCP for further advise.  Derl Barrow, RN

## 2015-12-04 ENCOUNTER — Ambulatory Visit (AMBULATORY_SURGERY_CENTER): Payer: Self-pay | Admitting: *Deleted

## 2015-12-04 VITALS — Ht 74.0 in | Wt 177.4 lb

## 2015-12-04 DIAGNOSIS — Z86010 Personal history of colonic polyps: Secondary | ICD-10-CM

## 2015-12-04 NOTE — Progress Notes (Signed)
No allergies to eggs or soy. No problems with anesthesia.  Pt not given Emmi instructions for colonoscopy; no computer access  No oxygen use  No diet drug use  

## 2015-12-09 ENCOUNTER — Encounter: Payer: Self-pay | Admitting: Internal Medicine

## 2015-12-18 ENCOUNTER — Encounter: Payer: Self-pay | Admitting: Internal Medicine

## 2015-12-18 ENCOUNTER — Ambulatory Visit (AMBULATORY_SURGERY_CENTER): Payer: Medicare Other | Admitting: Internal Medicine

## 2015-12-18 VITALS — BP 131/80 | HR 65 | Temp 97.5°F | Resp 12 | Ht 74.0 in | Wt 177.0 lb

## 2015-12-18 DIAGNOSIS — K635 Polyp of colon: Secondary | ICD-10-CM | POA: Diagnosis not present

## 2015-12-18 DIAGNOSIS — Z86010 Personal history of colonic polyps: Secondary | ICD-10-CM | POA: Diagnosis present

## 2015-12-18 DIAGNOSIS — D123 Benign neoplasm of transverse colon: Secondary | ICD-10-CM

## 2015-12-18 LAB — GLUCOSE, CAPILLARY
Glucose-Capillary: 89 mg/dL (ref 65–99)
Glucose-Capillary: 76 mg/dL (ref 65–99)

## 2015-12-18 MED ORDER — SODIUM CHLORIDE 0.9 % IV SOLN
500.0000 mL | INTRAVENOUS | Status: DC
Start: 2015-12-18 — End: 2015-12-18

## 2015-12-18 NOTE — Op Note (Signed)
[facility] Patient Name: [name] Procedure Date: 12/18/2015 10:48 AM MRN: [id number] Endoscopist: Gatha Mayer , MD Age: 75 Referring MD:  Date of Birth: 10-25-1940 Gender: Male Account #: [id number] Procedure:                Colonoscopy Indications:              Surveillance: Personal history of adenomatous                            polyps on last colonoscopy 5 years ago Medicines:                Propofol per Anesthesia, Monitored Anesthesia Care Procedure:                Pre-Anesthesia Assessment:                           - Prior to the procedure, a History and Physical                            was performed, and patient medications and                            allergies were reviewed. The patient's tolerance of                            previous anesthesia was also reviewed. The risks                            and benefits of the procedure and the sedation                            options and risks were discussed with the patient.                            All questions were answered, and informed consent                            was obtained. Prior Anticoagulants: The patient has                            taken no previous anticoagulant or antiplatelet                            agents. ASA Grade Assessment: II - A patient with                            mild systemic disease. After reviewing the risks                            and benefits, the patient was deemed in                            satisfactory condition to undergo the procedure.  After obtaining informed consent, the colonoscope                            was passed under direct vision. Throughout the                            procedure, the patient's blood pressure, pulse, and                            oxygen saturations were monitored continuously. The                            Model CF-HQ190L [id number]) scope was introduced   through the anus and advanced to the the                            ileocolonic anastomosis. The colonoscopy was                            performed without difficulty. The patient tolerated                            the procedure well. The quality of the bowel                            preparation was good. The bowel preparation used                            was Miralax. The terminal ileum and the rectum were                            photographed. Scope In: 11:10:31 AM Scope Out: 11:26:51 AM Scope Withdrawal Time: 0 hours 12 minutes 0 seconds  Total Procedure Duration: 0 hours 16 minutes 20 seconds  Findings:                 The perianal and digital rectal examinations were                            normal. Pertinent negatives include normal prostate                            (size, shape, and consistency).                           A 10 mm polyp was found in the transverse colon.                            The polyp was sessile. The polyp was removed with a                            cold snare. Resection and retrieval were complete.                            Verification of  patient identification for the                            specimen was done. Estimated blood loss was minimal.                           There was evidence of a prior end-to-side                            ileo-colonic anastomosis in the transverse colon.                            This was patent and was characterized by healthy                            appearing mucosa. The anastomosis was traversed.                           The exam was otherwise without abnormality on                            direct and retroflexion views. Complications:            No immediate complications. Estimated Blood Loss:     Estimated blood loss was minimal. Impression:               - One 10 mm polyp in the transverse colon, removed                            with a cold snare. Resected and retrieved.                            - Patent end-to-side ileo-colonic anastomosis,                            characterized by healthy appearing mucosa.                           - The examination was otherwise normal on direct                            and retroflexion views.                           - Personal history of colonic polyps. 4 cm adenoma                            resected 2011 Recommendation:           - Patient has a contact number available for                            emergencies. The signs and symptoms of potential                            delayed complications were discussed with  the                            patient. Return to normal activities tomorrow.                            Written discharge instructions were provided to the                            patient.                           - Resume previous diet.                           - Continue present medications.                           - Repeat colonoscopy is recommended. The                            colonoscopy date will be determined after pathology                            results from today's exam become available for                            review. Gatha Mayer, MD 12/18/2015 11:34:33 AM This report has been signed electronically.

## 2015-12-18 NOTE — Progress Notes (Signed)
A/ox3 pleased with MAC, report to April RN 

## 2015-12-18 NOTE — Progress Notes (Signed)
Called to room to assist during endoscopic procedure.  Patient ID and intended procedure confirmed with present staff. Received instructions for my participation in the procedure from the performing physician.  

## 2015-12-18 NOTE — Patient Instructions (Addendum)
I removed one polyp.  I will let you know pathology results and when/if to have another routine colonoscopy by mail.  I appreciate the opportunity to care for you. Gatha Mayer, MD, FACG  YOU HAD AN ENDOSCOPIC PROCEDURE TODAY AT [facility]:   Refer to the procedure report that was given to you for any specific questions about what was found during the examination.  If the procedure report does not answer your questions, please call your gastroenterologist to clarify.  If you requested that your care partner not be given the details of your procedure findings, then the procedure report has been included in a sealed envelope for you to review at your convenience later.  YOU SHOULD EXPECT: Some feelings of bloating in the abdomen. Passage of more gas than usual.  Walking can help get rid of the air that was put into your GI tract during the procedure and reduce the bloating. If you had a lower endoscopy (such as a colonoscopy or flexible sigmoidoscopy) you may notice spotting of blood in your stool or on the toilet paper. If you underwent a bowel prep for your procedure, you may not have a normal bowel movement for a few days.  Please Note:  You might notice some irritation and congestion in your nose or some drainage.  This is from the oxygen used during your procedure.  There is no need for concern and it should clear up in a day or so.  SYMPTOMS TO REPORT IMMEDIATELY:   Following lower endoscopy (colonoscopy or flexible sigmoidoscopy):  Excessive amounts of blood in the stool  Significant tenderness or worsening of abdominal pains  Swelling of the abdomen that is new, acute  Fever of 100F or higher   For urgent or emergent issues, a gastroenterologist can be reached at any hour by calling [phone].   DIET: Your first meal following the procedure should be a small meal and then it is ok to progress to your normal diet. Heavy or fried foods are harder to  digest and may make you feel nauseous or bloated.  Likewise, meals heavy in dairy and vegetables can increase bloating.  Drink plenty of fluids but you should avoid alcoholic beverages for 24 hours.  ACTIVITY:  You should plan to take it easy for the rest of today and you should NOT DRIVE or use heavy machinery until tomorrow (because of the sedation medicines used during the test).    FOLLOW UP: Our staff will call the number listed on your records the next business day following your procedure to check on you and address any questions or concerns that you may have regarding the information given to you following your procedure. If we do not reach you, we will leave a message.  However, if you are feeling well and you are not experiencing any problems, there is no need to return our call.  We will assume that you have returned to your regular daily activities without incident.  If any biopsies were taken you will be contacted by phone or by letter within the next 1-3 weeks.  Please call us at [phone] if you have not heard about the biopsies in 3 weeks.    SIGNATURES/CONFIDENTIALITY: You and/or your care partner have signed paperwork which will be entered into your electronic medical record.  These signatures attest to the fact that that the information above on your After Visit Summary has been reviewed and is understood.  Full responsibility of  the confidentiality of this discharge information lies with you and/or your care-partner.  Polyp-handout given  Repeat colonoscopy will be determined by health and pathology.

## 2015-12-19 ENCOUNTER — Telehealth: Payer: Self-pay

## 2015-12-19 NOTE — Telephone Encounter (Signed)
  Follow up Call-  Call back number 12/18/2015  Post procedure Call Back phone  # [phone]  Permission to leave phone message Yes     Patient questions:  Do you have a fever, pain , or abdominal swelling? No. Pain Score  0 *  Have you tolerated food without any problems? Yes.    Have you been able to return to your normal activities? Yes.    Do you have any questions about your discharge instructions: Diet   No. Medications  No. Follow up visit  No.  Do you have questions or concerns about your Care? No.  Actions: * If pain score is 4 or above: No action needed, pain <4.

## 2015-12-24 ENCOUNTER — Encounter: Payer: Self-pay | Admitting: Internal Medicine

## 2015-12-24 DIAGNOSIS — D126 Benign neoplasm of colon, unspecified: Secondary | ICD-10-CM

## 2015-12-24 NOTE — Progress Notes (Signed)
Quick Note:  No dx - insufficient tissue No recall - age ______

## 2016-02-12 ENCOUNTER — Other Ambulatory Visit: Payer: Self-pay | Admitting: *Deleted

## 2016-02-12 MED ORDER — INSULIN PEN NEEDLE 31G X 5 MM MISC: 25.0000 [IU] | Freq: Three times a day (TID) | 5 refills | Status: DC

## 2016-02-12 MED ORDER — GLIPIZIDE ER 10 MG PO TB24: 10.0000 mg | ORAL_TABLET | Freq: Every day | ORAL | 1 refills | Status: DC

## 2016-02-24 NOTE — Telephone Encounter (Signed)
Erroneous encounter

## 2016-04-05 ENCOUNTER — Other Ambulatory Visit: Payer: Self-pay | Admitting: *Deleted

## 2016-04-05 DIAGNOSIS — I10 Essential (primary) hypertension: Secondary | ICD-10-CM

## 2016-04-05 MED ORDER — PRAVASTATIN SODIUM 80 MG PO TABS: 80.0000 mg | ORAL_TABLET | Freq: Every day | ORAL | 1 refills | Status: DC

## 2016-04-05 MED ORDER — VALSARTAN-HYDROCHLOROTHIAZIDE 160-12.5 MG PO TABS: 1.0000 | ORAL_TABLET | Freq: Every day | ORAL | 1 refills | Status: DC

## 2016-04-13 ENCOUNTER — Other Ambulatory Visit (INDEPENDENT_AMBULATORY_CARE_PROVIDER_SITE_OTHER): Payer: Medicare Other | Admitting: Internal Medicine

## 2016-04-13 ENCOUNTER — Encounter: Payer: Self-pay | Admitting: Internal Medicine

## 2016-04-13 ENCOUNTER — Ambulatory Visit (INDEPENDENT_AMBULATORY_CARE_PROVIDER_SITE_OTHER): Payer: Medicare Other | Admitting: *Deleted

## 2016-04-13 DIAGNOSIS — E119 Type 2 diabetes mellitus without complications: Secondary | ICD-10-CM

## 2016-04-13 DIAGNOSIS — Z23 Encounter for immunization: Secondary | ICD-10-CM

## 2016-04-13 LAB — POCT GLYCOSYLATED HEMOGLOBIN (HGB A1C): Hemoglobin A1C: 6.4

## 2016-04-13 NOTE — Progress Notes (Signed)
   Patient in nurse clinic for Flu vaccine.  A1c checked today 6.4.  Will forward to PCP.  Derl Barrow, RN

## 2016-04-19 ENCOUNTER — Other Ambulatory Visit: Payer: Self-pay | Admitting: *Deleted

## 2016-04-19 MED ORDER — METFORMIN HCL 500 MG PO TABS: 1000.0000 mg | ORAL_TABLET | Freq: Two times a day (BID) | ORAL | 1 refills | Status: DC

## 2016-04-21 ENCOUNTER — Ambulatory Visit (INDEPENDENT_AMBULATORY_CARE_PROVIDER_SITE_OTHER): Payer: Medicare Other | Admitting: Internal Medicine

## 2016-04-21 VITALS — BP 120/70 | HR 81 | Temp 98.0°F | Wt 176.0 lb

## 2016-04-21 DIAGNOSIS — E0590 Thyrotoxicosis, unspecified without thyrotoxic crisis or storm: Secondary | ICD-10-CM

## 2016-04-21 DIAGNOSIS — I10 Essential (primary) hypertension: Secondary | ICD-10-CM | POA: Diagnosis not present

## 2016-04-21 DIAGNOSIS — Z794 Long term (current) use of insulin: Secondary | ICD-10-CM | POA: Diagnosis not present

## 2016-04-21 DIAGNOSIS — E119 Type 2 diabetes mellitus without complications: Secondary | ICD-10-CM | POA: Diagnosis not present

## 2016-04-21 LAB — BASIC METABOLIC PANEL WITH GFR
BUN: 14 mg/dL (ref 7–25)
CO2: 25 mmol/L (ref 20–31)
Calcium: 10.3 mg/dL (ref 8.6–10.3)
Chloride: 103 mmol/L (ref 98–110)
Creat: 1.06 mg/dL (ref 0.70–1.18)
GFR, Est African American: 78 mL/min (ref >=60)
GFR, Est Non African American: 68 mL/min (ref >=60)
Glucose, Bld: 89 mg/dL (ref 65–99)
Potassium: 4.1 mmol/L (ref 3.5–5.3)
Sodium: 137 mmol/L (ref 135–146)

## 2016-04-21 LAB — CBC WITH DIFFERENTIAL/PLATELET
Basophils Absolute: 53 cells/uL (ref 0–200)
Basophils Relative: 1 %
Eosinophils Absolute: 265 cells/uL (ref 15–500)
Eosinophils Relative: 5 %
HCT: 39.2 % (ref 38.5–50.0)
Hemoglobin: 12.8 g/dL — ABNORMAL LOW (ref 13.2–17.1)
Lymphocytes Relative: 38 %
Lymphs Abs: 2014 cells/uL (ref 850–3900)
MCH: 28.9 pg (ref 27.0–33.0)
MCHC: 32.7 g/dL (ref 32.0–36.0)
MCV: 88.5 fL (ref 80.0–100.0)
MPV: 9.8 fL (ref 7.5–12.5)
Monocytes Absolute: 583 cells/uL (ref 200–950)
Monocytes Relative: 11 %
Neutro Abs: 2385 cells/uL (ref 1500–7800)
Neutrophils Relative %: 45 %
Platelets: 209 10*3/uL (ref 140–400)
RBC: 4.43 MIL/uL (ref 4.20–5.80)
RDW: 13.6 % (ref 11.0–15.0)
WBC: 5.3 10*3/uL (ref 3.8–10.8)

## 2016-04-21 LAB — T4, FREE: Free T4: 1.1 ng/dL (ref 0.8–1.8)

## 2016-04-21 LAB — TSH: TSH: 1.38 mIU/L (ref 0.40–4.50)

## 2016-04-21 LAB — T3, FREE: T3, Free: 3.3 pg/mL (ref 2.3–4.2)

## 2016-04-21 NOTE — Patient Instructions (Addendum)
Mr. Hammers I want you to completely stop the glipizide that you're currently on. I think that your blood sugars are going too low and this is very dangerous. I would also change your time for taking Lantus to the morning. He can take her Lantus right before your breakfast. I would like you to follow-up in 2 weeks to reevaluate your blood sugar levels.

## 2016-04-21 NOTE — Progress Notes (Signed)
   [facility] Kerrin Mo, MD Phone: [phone]  Reason For Visit: Diabetes and HTN   This was my first time meeting Mr. Canuto. He seemed to be slightly confused when discussing his medical care. I have concern for memory loss just from our conversation today.  DIABETES- Has had diabetes for about 6 years from records. However patient seemed confused regarding the length of time with diagnosis. Stating that he had Diabetes for about "2 years" which was not accurate. Last A1C 6.4 11/7. Patient had an A1C of 6 for several years now.   Disease Monitoring: Blood Sugar ranges- CBGs in 90-80, CBG 110s in the afternoon- CBG max 180 - patient could not report to me any of blood sugars. Stated "I had all his glucose readings in the computer." However was able to obtain looking through his glucometer.   Polyuria/phagia/dipsia-  None Visual problems- see opthalmology yearly   Medications: Compliance- Lantus 25 units at night; Metformin 1000 mg BID, Glipizide 10 mg -24 hour   Hypoglycemic symptoms- below 70s - 2-3 times in the past couple of weeks  Sometimes gets night sweats but does not think it is due to his blood sugars.   CHRONIC HTN: Reports No issues  Current Meds -  Valsartan-HCTZ  Reports good compliance, took meds today. Tolerating well, w/o complaints. Lifestyle - goes for walks, Denies CP, dyspnea, HA, edema, dizziness / lightheadedness   Past Medical History Reviewed problem list.  Medications- reviewed and updated No additions to family history  Objective: BP 120/70   Pulse 81   Temp 98 F (36.7 C) (Oral)   Wt 176 lb (79.8 kg)   BMI 22.60 kg/m  Gen: NAD, alert, cooperative with exam Cardio: regular rate and rhythm, S1S2 heard, no murmurs appreciated Pulm: clear to auscultation bilaterally, no wheezes, rhonchi or rales Extremities: warm, well perfused, No edema, cyanosis or clubbing;  MSK: Ataxic gait, walks with a cane    Assessment/Plan: See problem based a/p  Diabetes mellitus This is my first time meeting Mr. Mazzanti. His A1c has been in the 6's for several years. With his last A1c being 6.4. He reported several episodes of hypoglycemia and I am concerned that there may have been more than that as patient seems to have memory loss. -Current regiment was insulin 25 units daily, glipizide 10 mg 24-hour tablet and metformin 1000 mg twice a day -Explained to patient that I would like him to take his insulin in the morning and I will want him to stop glipizide completely-I took the pills from him in the office -I asked him to follow-up with me in 2 weeks and in the meantime complete a CBG chart for morning and afternoon blood sugars.  -My goal A1c for him would be between 7 and 7.5; I want to determine the amount of memory loss patient has, will need to perform a MOCHA as well at some point   Essential hypertension Blood pressure, well controlled. Patient denies dizziness or falls.  Will continue to monitor

## 2016-04-22 ENCOUNTER — Encounter: Payer: Self-pay | Admitting: Internal Medicine

## 2016-04-22 MED ORDER — ACCU-CHEK AVIVA DEVI: 0 refills | Status: AC

## 2016-04-22 NOTE — Assessment & Plan Note (Signed)
Blood pressure, well controlled. Patient denies dizziness or falls.  Will continue to monitor

## 2016-04-22 NOTE — Assessment & Plan Note (Signed)
This is my first time meeting Mr. Looney. His A1c has been in the 6's for several years. With his last A1c being 6.4. He reported several episodes of hypoglycemia and I am concerned that there may have been more than that as patient seems to have memory loss. -Current regiment was insulin 25 units daily, glipizide 10 mg 24-hour tablet and metformin 1000 mg twice a day -Explained to patient that I would like him to take his insulin in the morning and I will want him to stop glipizide completely-I took the pills from him in the office -I asked him to follow-up with me in 2 weeks and in the meantime complete a CBG chart for morning and afternoon blood sugars.  -My goal A1c for him would be between 7 and 7.5; I want to determine the amount of memory loss patient has, will need to perform a MOCHA as well at some point

## 2016-04-23 ENCOUNTER — Other Ambulatory Visit: Payer: Self-pay | Admitting: *Deleted

## 2016-04-23 MED ORDER — INSULIN GLARGINE 100 UNIT/ML SOLOSTAR PEN: PEN_INJECTOR | SUBCUTANEOUS | 3 refills | Status: DC

## 2016-05-06 ENCOUNTER — Encounter: Payer: Self-pay | Admitting: Internal Medicine

## 2016-05-06 ENCOUNTER — Ambulatory Visit (INDEPENDENT_AMBULATORY_CARE_PROVIDER_SITE_OTHER): Payer: Medicare Other | Admitting: Internal Medicine

## 2016-05-06 VITALS — BP 93/71 | HR 97 | Temp 98.3°F | Ht 74.0 in | Wt 177.0 lb

## 2016-05-06 DIAGNOSIS — R7309 Other abnormal glucose: Secondary | ICD-10-CM

## 2016-05-06 DIAGNOSIS — E119 Type 2 diabetes mellitus without complications: Secondary | ICD-10-CM

## 2016-05-06 DIAGNOSIS — Z794 Long term (current) use of insulin: Secondary | ICD-10-CM

## 2016-05-06 LAB — GLUCOSE, POCT (MANUAL RESULT ENTRY): POC Glucose: 129 mg/dl — AB (ref 70–99)

## 2016-05-06 MED ORDER — INSULIN GLARGINE 100 UNIT/ML SOLOSTAR PEN: 20.0000 [IU] | PEN_INJECTOR | Freq: Every morning | SUBCUTANEOUS | 3 refills | Status: DC

## 2016-05-06 NOTE — Progress Notes (Deleted)
   [facility] Kerrin Mo, MD Phone: [phone]  Reason For Visit:   # CHRONIC DM, Type 2: Reports no concerns CBGs: Avg ***, Low ***, High ***. Checks CBGs *** Meds: Lantus *** daily in ***. *** Reports *** good compliance. Tolerating well w/o side-effects Currently on ACEi / ARB *** Lifestyle: Diet (***) / Exercise (***) Any hypoglycemia episodes: Denies palpations, diaphoresis, fatigue, weakness, jittery Denies polyuria, visual changes, numbness or tingling.  -   Past Medical History Reviewed problem list.  Medications- reviewed and updated No additions to family history Social history- patient is a *** smoker  Objective: BP 93/71   Pulse 97   Temp 98.3 F (36.8 C) (Oral)   Ht 6\' 2"  (1.88 m)   Wt 177 lb (80.3 kg)   SpO2 100%   BMI 22.73 kg/m  Gen: NAD, alert, cooperative with exam HEENT: Normal    Neck: No masses palpated. No lymphadenopathy    Ears: Tympanic membranes intact, normal light reflex, no erythema, no bulging    Eyes: PERRLA, EOMI    Nose: nasal turbinates moist    Throat: moist mucus membranes, no erythema Cardio: regular rate and rhythm, S1S2 heard, no murmurs appreciated Pulm: clear to auscultation bilaterally, no wheezes, rhonchi or rales GI: soft, non-tender, non-distended, bowel sounds present, no hepatomegaly, no splenomegaly GU: external vaginal tissue ***, cervix ***, *** punctate lesions on cervix appreciated, *** discharge from cervical os, *** bleeding, *** cervical motion tenderness, *** abdominal/ adnexal masses Extremities: warm, well perfused, No edema, cyanosis or clubbing;  MSK: Normal gait and station Skin: dry, intact, no rashes or lesions Neuro: Strength and sensation grossly intact   Assessment/Plan: See problem based a/p

## 2016-05-06 NOTE — Progress Notes (Signed)
   [facility] Kerrin Mo, MD Phone: [phone]  Reason For Visit:   CHRONIC DM, Type 2: Reports no concerns CBGs: Avg morning CBG 100, avg midday 120 avg nighttime 110  Low 77 (12 PM), High 170 (5 PM) Checks CBGs 3 times a day  Meds: Lantus 25 units daily, Metformin 1000 mg BID  Reports good compliance. Tolerating well w/o side-effects Currently on ACEi / ARB  Any hypoglycemia episodes: Denies palpations, diaphoresis, fatigue, weakness, jittery Denies polyuria, visual changes, numbness or tingling.  Past Medical History Reviewed problem list.  Medications- reviewed and updated No additions to family history Social history- patient is a non-smoker  Objective: BP 93/71   Pulse 97   Temp 98.3 F (36.8 C) (Oral)   Ht 6\' 2"  (1.88 m)   Wt 177 lb (80.3 kg)   SpO2 100%   BMI 22.73 kg/m  Gen: NAD, alert, cooperative with exam Cardio: regular rate and rhythm, S1S2 heard, no murmurs appreciated Skin: dry, intact, no rashes or lesions Neuro: Strength and sensation grossly intact   Assessment/Plan: See problem based a/p  Diabetes mellitus Recently stopped Glipizide 10 mg 24 hour tablet due to A1C and previous episodes of hypoglycemia. Goal A1C 7.0  - Would continue metformin 1000 mg BID, some low blood sugars noted including CBGs of 77/84 - therefore will decrease insulin regiment to 20 units from 25 units daily  - Continue to record CBGs - Follow up in 2 months with return precautions given for early if patient's CBGs are worse; physical at next visit; will do a MOCHA

## 2016-05-06 NOTE — Patient Instructions (Addendum)
I want you to decrease your insulin to 20 units from 25 units. If you have blood sugars greater than 200 or less than 70, please come back see me sooner. Otherwise I want you to follow up with me for a physical at the beginning of January.

## 2016-05-06 NOTE — Assessment & Plan Note (Signed)
Recently stopped Glipizide 10 mg 24 hour tablet due to A1C and previous episodes of hypoglycemia. Goal A1C 7.0  - Would continue metformin 1000 mg BID, some low blood sugars noted including CBGs of 77/84 - therefore will decrease insulin regiment to 20 units from 25 units daily  - Continue to record CBGs - Follow up in 2 months with return precautions given for early if patient's CBGs are worse; physical at next visit; will do a MOCHA

## 2016-05-08 ENCOUNTER — Other Ambulatory Visit: Payer: Self-pay | Admitting: Family Medicine

## 2016-05-11 ENCOUNTER — Other Ambulatory Visit: Payer: Self-pay | Admitting: Family Medicine

## 2016-05-11 ENCOUNTER — Other Ambulatory Visit: Payer: Self-pay | Admitting: *Deleted

## 2016-05-12 ENCOUNTER — Telehealth: Payer: Self-pay | Admitting: Internal Medicine

## 2016-05-12 DIAGNOSIS — Z794 Long term (current) use of insulin: Principal | ICD-10-CM

## 2016-05-12 DIAGNOSIS — E119 Type 2 diabetes mellitus without complications: Secondary | ICD-10-CM

## 2016-05-12 MED ORDER — GLUCOSE BLOOD VI STRP: 1.0000 | ORAL_STRIP | Freq: Three times a day (TID) | 3 refills | Status: DC

## 2016-05-12 NOTE — Telephone Encounter (Signed)
Called patient per patient did not have refills, though I have refilled. Called pharmacy and we made sure that refills were in place. Called patient back to let him know both refills are available

## 2016-05-12 NOTE — Telephone Encounter (Signed)
Patient came to office stated he needs refill for 90 days supply on his RX test strips & Lancets. Patient would like his pcp to give him a call to discuss. He is almost out.  Pharmacy [facility] on [address]

## 2016-07-06 ENCOUNTER — Encounter: Payer: Self-pay | Admitting: Internal Medicine

## 2016-07-06 ENCOUNTER — Ambulatory Visit (INDEPENDENT_AMBULATORY_CARE_PROVIDER_SITE_OTHER): Payer: Medicare Other | Admitting: Internal Medicine

## 2016-07-06 VITALS — BP 106/58 | HR 104 | Temp 98.2°F | Ht 74.0 in | Wt 177.2 lb

## 2016-07-06 DIAGNOSIS — F03918 Unspecified dementia, unspecified severity, with other behavioral disturbance: Secondary | ICD-10-CM | POA: Insufficient documentation

## 2016-07-06 DIAGNOSIS — E119 Type 2 diabetes mellitus without complications: Secondary | ICD-10-CM

## 2016-07-06 DIAGNOSIS — R413 Other amnesia: Secondary | ICD-10-CM

## 2016-07-06 DIAGNOSIS — I10 Essential (primary) hypertension: Secondary | ICD-10-CM | POA: Diagnosis not present

## 2016-07-06 DIAGNOSIS — F0391 Unspecified dementia with behavioral disturbance: Secondary | ICD-10-CM | POA: Insufficient documentation

## 2016-07-06 DIAGNOSIS — F0390 Unspecified dementia without behavioral disturbance: Secondary | ICD-10-CM | POA: Insufficient documentation

## 2016-07-06 DIAGNOSIS — Z794 Long term (current) use of insulin: Secondary | ICD-10-CM

## 2016-07-06 LAB — POCT GLYCOSYLATED HEMOGLOBIN (HGB A1C): Hemoglobin A1C: 6.2

## 2016-07-06 MED ORDER — INSULIN GLARGINE 100 UNIT/ML SOLOSTAR PEN: 10.0000 [IU] | PEN_INJECTOR | Freq: Every day | SUBCUTANEOUS | 3 refills | Status: DC

## 2016-07-06 NOTE — Assessment & Plan Note (Addendum)
A1C 6.2 today. Patient with reported episodes of hypoglycemia. Decrease Lantus from 20 to 10 units. Continue metformin. Follow up in 1 month. Goal A1C of 7.0  - Consider stopping insulin completely or decreasing to 5 units if patient continues to have lows  - Performed diabetic foot exam  - Patient denies wanting pneumonia shot  - planning to see any eye doctor soon for evaluation of eyes - Follow up in 1 month  - Will get a lipid panel

## 2016-07-06 NOTE — Progress Notes (Signed)
   [name] Kerrin Mo, MD Phone: [phone]  Reason For Visit:  CHRONIC DM, Type 2: Reports no concerns CBGs: Avg 100, Low 68, High 189. Checks CBGs 3 times daily  Meds: Lantus 20- units daily at 7 pm at night. Patient has also been taking his metformin without any issues  Reports good compliance. Tolerating well w/o side-effects Currently on ACEi / ARB  Hypoglycemic episodes x 2, 68 and 69 both at lunch time - states that he generally eats a small breakfast and larger meal at night Denies polyuria, visual changes, numbness or tingling.  CHRONIC HTN: Reports none  Current Meds - Diovan Reports good compliance, took meds today. Tolerating well, w/o complaints. Lifestyle - walks as much as he can  Denies CP, dyspnea, HA, edema, dizziness / lightheadedness  Hyperlipidemia  - Takes pravastatin no issues, no muscle tenderness   Memory loss  - Currently patient with memory loss, noted in clinical visits, - IADLs and ADLs not currently impaired   Past Medical History Reviewed problem list.  Medications- reviewed and updated No additions to family history Social history- patient is a non- smoker  Objective: BP (!) 106/58   Pulse (!) 104   Temp 98.2 F (36.8 C) (Oral)   Ht 6\' 2"  (1.88 m)   Wt 177 lb 3.2 oz (80.4 kg)   SpO2 95%   BMI 22.75 kg/m  Gen: NAD, alert, cooperative with exam Cardio: regular rate and rhythm, S1S2 heard, no murmurs appreciated Pulm: clear to auscultation bilaterally, no wheezes, rhonchi or rales MSK: Normal gait and station Skin: dry, intact, no rashes or lesions  Diabetic Foot Exam - Simple   Simple Foot Form Diabetic Foot exam was performed with the following findings:  Yes 07/06/2016 10:00 AM  Visual Inspection No deformities, no ulcerations, no other skin breakdown bilaterally:  Yes Sensation Testing Pulse Check Posterior Tibialis and Dorsalis pulse intact bilaterally:  Yes Comments Patient with decreased  sensation in  1/6 spots tested for sensation of the left foot. Otherwise intact to touch with monofilament testing.     Assessment/Plan: See problem based a/p  Diabetes mellitus A1C 6.2 today. Patient with reported episodes of hypoglycemia. Decrease Lantus from 20 to 10 units. Continue metformin. Follow up in 1 month. Goal A1C of 7.0  - Consider stopping insulin completely or decreasing to 5 units if patient continues to have lows  - Performed diabetic foot exam  - Patient denies wanting pneumonia shot  - planning to see any eye doctor soon for evaluation of eyes - Follow up in 1 month  - Will get a lipid panel   Essential hypertension Well controlled  - Continue Diovan   Memory loss Memory loss with a MOCHA of 12. Consistent with severe dementia. However patient is functional both ADLs and IADLs. Will continue to follow. Consider providing advance directive information at next visit

## 2016-07-06 NOTE — Progress Notes (Signed)
a1c

## 2016-07-06 NOTE — Assessment & Plan Note (Addendum)
Memory loss with a MOCHA of 12. Consistent with severe dementia. However patient is functional both ADLs and IADLs. Will continue to follow. Consider providing advance directive information at next visit

## 2016-07-06 NOTE — Assessment & Plan Note (Signed)
Well controlled  - Continue Diovan

## 2016-07-06 NOTE — Patient Instructions (Signed)
Please decrease your insulin to 10 units and follow up with me in 1 month to check on your blood sugars. You are doing a great job with recording your blood sugars

## 2016-07-08 ENCOUNTER — Other Ambulatory Visit: Payer: Self-pay | Admitting: Family Medicine

## 2016-07-22 ENCOUNTER — Encounter: Payer: Self-pay | Admitting: Internal Medicine

## 2016-08-05 LAB — LIPID PANEL
Chol/HDL Ratio: 4.2 ratio units (ref 0.0–5.0)
Cholesterol, Total: 152 mg/dL (ref 100–199)
HDL: 36 mg/dL — ABNORMAL LOW (ref >39)
LDL Calculated: 106 mg/dL — ABNORMAL HIGH (ref 0–99)
Triglycerides: 52 mg/dL (ref 0–149)
VLDL Cholesterol Cal: 10 mg/dL (ref 5–40)

## 2016-09-27 ENCOUNTER — Other Ambulatory Visit: Payer: Self-pay | Admitting: *Deleted

## 2016-09-27 DIAGNOSIS — I10 Essential (primary) hypertension: Secondary | ICD-10-CM

## 2016-09-28 NOTE — Telephone Encounter (Signed)
2nd request.  Martin, Tamika L, RN  

## 2016-09-29 MED ORDER — VALSARTAN-HYDROCHLOROTHIAZIDE 160-12.5 MG PO TABS: 1.0000 | ORAL_TABLET | Freq: Every day | ORAL | 1 refills | Status: DC

## 2016-09-29 MED ORDER — PRAVASTATIN SODIUM 80 MG PO TABS: 80.0000 mg | ORAL_TABLET | Freq: Every day | ORAL | 1 refills | Status: DC

## 2016-12-09 ENCOUNTER — Other Ambulatory Visit: Payer: Self-pay | Admitting: *Deleted

## 2016-12-09 MED ORDER — METFORMIN HCL 500 MG PO TABS: 1000.0000 mg | ORAL_TABLET | Freq: Two times a day (BID) | ORAL | 0 refills | Status: DC

## 2016-12-20 ENCOUNTER — Ambulatory Visit (INDEPENDENT_AMBULATORY_CARE_PROVIDER_SITE_OTHER): Payer: Medicare Other | Admitting: Internal Medicine

## 2016-12-20 VITALS — BP 124/72 | HR 101 | Temp 98.3°F | Ht 74.0 in | Wt 176.0 lb

## 2016-12-20 DIAGNOSIS — D509 Iron deficiency anemia, unspecified: Secondary | ICD-10-CM

## 2016-12-20 DIAGNOSIS — E119 Type 2 diabetes mellitus without complications: Secondary | ICD-10-CM

## 2016-12-20 DIAGNOSIS — Z794 Long term (current) use of insulin: Secondary | ICD-10-CM

## 2016-12-20 DIAGNOSIS — E785 Hyperlipidemia, unspecified: Secondary | ICD-10-CM

## 2016-12-20 DIAGNOSIS — I10 Essential (primary) hypertension: Secondary | ICD-10-CM

## 2016-12-20 DIAGNOSIS — E1169 Type 2 diabetes mellitus with other specified complication: Secondary | ICD-10-CM | POA: Insufficient documentation

## 2016-12-20 LAB — POCT GLYCOSYLATED HEMOGLOBIN (HGB A1C): Hemoglobin A1C: 7

## 2016-12-20 MED ORDER — VALSARTAN-HYDROCHLOROTHIAZIDE 160-12.5 MG PO TABS: 1.0000 | ORAL_TABLET | Freq: Every day | ORAL | 2 refills | Status: DC

## 2016-12-20 MED ORDER — PRAVASTATIN SODIUM 80 MG PO TABS: 80.0000 mg | ORAL_TABLET | Freq: Every day | ORAL | 2 refills | Status: DC

## 2016-12-20 MED ORDER — ASPIRIN EC 81 MG PO TBEC: 81.0000 mg | DELAYED_RELEASE_TABLET | Freq: Every day | ORAL | 3 refills | Status: DC

## 2016-12-20 NOTE — Assessment & Plan Note (Signed)
A1C7- currently on Lantus 10 units plus metformin 1000 mg BID. Has had some blood sugar lows. Goal A1C 7.5  - Will stop Lantus  - Continue metformin 1000 mg BID  - POCT HgB A1C (CPT 83036) - CMP14+EGFR - F/U in 1 month if blood sugars elevated will start Januvia to help ensure less blood sugars lows

## 2016-12-20 NOTE — Assessment & Plan Note (Signed)
Continue pravastatin  Start aspirin  CMET

## 2016-12-20 NOTE — Assessment & Plan Note (Signed)
Obtain CBC  Not currently on iron  Follow up on results

## 2016-12-20 NOTE — Assessment & Plan Note (Signed)
Blood pressure well controllled  - Continue Diovan  - Obtain CMET

## 2016-12-20 NOTE — Patient Instructions (Addendum)
Please stop Lantus. I want you to follow up in 1 month to recheck on your diabetes. Otherwise you are doing great. Please make sure to take your 81 mg of aspirin.

## 2016-12-20 NOTE — Progress Notes (Signed)
   [facility] Kerrin Mo, MD Phone: [phone]  Reason For Visit:   # CHRONIC DM, Type 2: Reports no concerns, hard to keep track  CBGs: . Checks CBGs mostly in the 110s- 120s, about 3 blood sugars lows 66-69  - denies being symptomatic for the episodes of hypoglycemia  Meds: Lantus 10 units and metformin daily  Reports  good compliance. Tolerating well w/o side-effects Currently on ACEi / ARB Any hypoglycemia episodes: Denies palpations, diaphoresis, fatigue, weakness, jittery Denies polyuria, visual changes, numbness or tingling.  CHRONIC HTN: Reports no issues with the medication  Current Meds - Diovan  Reports good compliance, took meds today. Tolerating well, w/o complaints. Denies CP, dyspnea, HA, edema, dizziness / lightheadedness   HYPERLIPIDEMIA Disease Monitoring: See symptoms for Hypertension Medications: pravastatin  Compliance- Right upper quadrant pain- none  Muscle aches- none   Past Medical History Reviewed problem list.  Medications- reviewed and updated No additions to family history Social history- patient is a non smoker  Objective: BP 124/72   Pulse (!) 101   Temp 98.3 F (36.8 C) (Oral)   Ht '6\' 2"'$  (1.88 m)   Wt 176 lb (79.8 kg)   BMI 22.60 kg/m  Gen: NAD, alert, cooperative with exam Cardio: regular rate and rhythm, S1S2 heard, no murmurs appreciated Pulm: clear to auscultation bilaterally, no wheezes, rhonchi or rales Extremities: warm, well perfused, No edema, cyanosis or clubbing;   Assessment/Plan: See problem based a/p  Diabetes mellitus A1C7- currently on Lantus 10 units plus metformin 1000 mg BID. Has had some blood sugar lows. Goal A1C 7.5  - Will stop Lantus  - Continue metformin 1000 mg BID  - POCT HgB A1C (CPT 83036) - CMP14+EGFR - F/U in 1 month if blood sugars elevated will start Januvia to help ensure less blood sugars lows   Essential hypertension Blood pressure well controllled  - Continue  Diovan  - Obtain CMET    Hyperlipidemia Continue pravastatin  Start aspirin  CMET   ANEMIA-IRON DEFICIENCY Obtain CBC  Not currently on iron  Follow up on results

## 2016-12-21 ENCOUNTER — Encounter: Payer: Self-pay | Admitting: Internal Medicine

## 2016-12-21 LAB — CMP14+EGFR
ALT: 22 IU/L (ref 0–44)
AST: 18 IU/L (ref 0–40)
Albumin/Globulin Ratio: 1.2 (ref 1.2–2.2)
Albumin: 4.3 g/dL (ref 3.5–4.8)
Alkaline Phosphatase: 40 IU/L (ref 39–117)
BUN/Creatinine Ratio: 16 (ref 10–24)
BUN: 16 mg/dL (ref 8–27)
Bilirubin Total: 0.4 mg/dL (ref 0.0–1.2)
CO2: 20 mmol/L (ref 20–29)
Calcium: 10.1 mg/dL (ref 8.6–10.2)
Chloride: 99 mmol/L (ref 96–106)
Creatinine, Ser: 0.99 mg/dL (ref 0.76–1.27)
GFR calc Af Amer: 85 mL/min/{1.73_m2} (ref >59)
GFR calc non Af Amer: 73 mL/min/{1.73_m2} (ref >59)
Globulin, Total: 3.6 g/dL (ref 1.5–4.5)
Glucose: 172 mg/dL — ABNORMAL HIGH (ref 65–99)
Potassium: 4.4 mmol/L (ref 3.5–5.2)
Sodium: 139 mmol/L (ref 134–144)
Total Protein: 7.9 g/dL (ref 6.0–8.5)

## 2016-12-21 LAB — CBC
Hematocrit: 38.2 % (ref 37.5–51.0)
Hemoglobin: 12.3 g/dL — ABNORMAL LOW (ref 13.0–17.7)
MCH: 28.7 pg (ref 26.6–33.0)
MCHC: 32.2 g/dL (ref 31.5–35.7)
MCV: 89 fL (ref 79–97)
Platelets: 196 10*3/uL (ref 150–379)
RBC: 4.28 x10E6/uL (ref 4.14–5.80)
RDW: 14 % (ref 12.3–15.4)
WBC: 4 10*3/uL (ref 3.4–10.8)

## 2017-01-20 ENCOUNTER — Ambulatory Visit (INDEPENDENT_AMBULATORY_CARE_PROVIDER_SITE_OTHER): Payer: Medicare Other | Admitting: Internal Medicine

## 2017-01-20 ENCOUNTER — Encounter: Payer: Self-pay | Admitting: Internal Medicine

## 2017-01-20 VITALS — BP 98/58 | HR 94 | Temp 98.1°F | Ht 74.0 in | Wt 176.6 lb

## 2017-01-20 DIAGNOSIS — I10 Essential (primary) hypertension: Secondary | ICD-10-CM

## 2017-01-20 DIAGNOSIS — E785 Hyperlipidemia, unspecified: Secondary | ICD-10-CM | POA: Diagnosis not present

## 2017-01-20 DIAGNOSIS — E119 Type 2 diabetes mellitus without complications: Secondary | ICD-10-CM

## 2017-01-20 DIAGNOSIS — Z794 Long term (current) use of insulin: Secondary | ICD-10-CM

## 2017-01-20 LAB — POCT GLYCOSYLATED HEMOGLOBIN (HGB A1C): Hemoglobin A1C: 7.2

## 2017-01-20 MED ORDER — ASPIRIN EC 81 MG PO TBEC: 81.0000 mg | DELAYED_RELEASE_TABLET | Freq: Every day | ORAL | 3 refills | Status: DC

## 2017-01-20 NOTE — Patient Instructions (Signed)
I think you are doing great. We figured out that metformin is working well for you. I want you to continue the blood sugar checks for another month, at next visit we will make a decision about how often you need to check your blood sugars.

## 2017-01-20 NOTE — Progress Notes (Signed)
   [name] Kerrin Mo, MD Phone: [phone]  Reason For Visit: Follow up   # CHRONIC DM, Type 2:  Metformin 1000 mg BID,  CBGs: Avg 120, Min 24 and Max 200  Only taking metformin currently, checking blood sugars 3 times daily  Reports  good compliance. Tolerating well w/o side-effects Any hypoglycemia episodes: Denies palpations, diaphoresis, fatigue, weakness, jittery associated with this low blood sugar   CHRONIC HTN: Current Meds - still taking valsartan-HCTZ  Reports good compliance, took meds today. Tolerating well, w/o complaints. Denies CP, dyspnea, HA, edema, dizziness / lightheadedness  HYPERLIPIDEMIA Disease Monitoring: No issues  Medications:Atrovostatin  Compliance- good  Right upper quadrant pain- none  Muscle aches- none   Past Medical History Reviewed problem list.  Medications- reviewed and updated No additions to family history Social history- patient is a non- smoker  Objective: BP (!) 98/58   Pulse 94   Temp 98.1 F (36.7 C) (Oral)   Ht 6\' 2"  (1.88 m)   Wt 176 lb 9.6 oz (80.1 kg)   SpO2 97%   BMI 22.67 kg/m  Gen: NAD, alert, cooperative with exam Cardio: regular rate and rhythm, S1S2 heard, no murmurs appreciated Pulm: clear to auscultation bilaterally, no wheezes, rhonchi or rales GI: soft, non-tender, non-distended, bowel sounds present, no hepatomegaly, no splenomegaly Extremities: warm, well perfused, No edema, cyanosis or clubbing;  MSK: Normal gait and station Skin: dry, intact, no rashes or lesions    Assessment/Plan: See problem based a/p  Hyperlipidemia Doing well  Continue atrovostatin and aspirin   Essential hypertension Currently on Diovan  Would consider decreasing dosage given blood pressure on the lower end  Will discuss with patient at next visit  Diabetes mellitus Continue metformin alone as blood sugars have been well controlled  At next visit is blood sugars remain well controlled,  will have patient decrease check blood sugars once daily

## 2017-01-21 ENCOUNTER — Other Ambulatory Visit: Payer: Self-pay | Admitting: *Deleted

## 2017-01-24 MED ORDER — METFORMIN HCL 500 MG PO TABS: 1000.0000 mg | ORAL_TABLET | Freq: Two times a day (BID) | ORAL | 0 refills | Status: DC

## 2017-01-24 NOTE — Assessment & Plan Note (Signed)
Continue metformin alone as blood sugars have been well controlled  At next visit is blood sugars remain well controlled, will have patient decrease check blood sugars once daily

## 2017-01-24 NOTE — Assessment & Plan Note (Signed)
Currently on Diovan  Would consider decreasing dosage given blood pressure on the lower end  Will discuss with patient at next visit

## 2017-01-24 NOTE — Assessment & Plan Note (Signed)
Doing well  Continue atrovostatin and aspirin

## 2017-03-17 ENCOUNTER — Other Ambulatory Visit: Payer: Self-pay | Admitting: Internal Medicine

## 2017-03-17 DIAGNOSIS — Z794 Long term (current) use of insulin: Principal | ICD-10-CM

## 2017-03-17 DIAGNOSIS — E119 Type 2 diabetes mellitus without complications: Secondary | ICD-10-CM

## 2017-03-17 MED ORDER — GLUCOSE BLOOD VI STRP: 1.0000 | ORAL_STRIP | Freq: Three times a day (TID) | 3 refills | Status: DC

## 2017-04-13 ENCOUNTER — Encounter: Payer: Self-pay | Admitting: Internal Medicine

## 2017-04-13 ENCOUNTER — Ambulatory Visit: Payer: Medicare Other | Admitting: Internal Medicine

## 2017-04-13 ENCOUNTER — Other Ambulatory Visit: Payer: Self-pay

## 2017-04-13 VITALS — BP 118/68 | HR 112 | Temp 98.7°F | Ht 74.0 in | Wt 174.0 lb

## 2017-04-13 DIAGNOSIS — Z794 Long term (current) use of insulin: Secondary | ICD-10-CM | POA: Diagnosis not present

## 2017-04-13 DIAGNOSIS — E119 Type 2 diabetes mellitus without complications: Secondary | ICD-10-CM | POA: Diagnosis not present

## 2017-04-13 DIAGNOSIS — Z23 Encounter for immunization: Secondary | ICD-10-CM

## 2017-04-13 DIAGNOSIS — I10 Essential (primary) hypertension: Secondary | ICD-10-CM

## 2017-04-13 DIAGNOSIS — D509 Iron deficiency anemia, unspecified: Secondary | ICD-10-CM

## 2017-04-13 DIAGNOSIS — R220 Localized swelling, mass and lump, head: Secondary | ICD-10-CM

## 2017-04-13 LAB — POCT GLYCOSYLATED HEMOGLOBIN (HGB A1C): Hemoglobin A1C: 6.9

## 2017-04-13 MED ORDER — HYDROCHLOROTHIAZIDE 25 MG PO TABS: 25.0000 mg | ORAL_TABLET | Freq: Every day | ORAL | 3 refills | Status: DC

## 2017-04-13 MED ORDER — HYDROCHLOROTHIAZIDE 12.5 MG PO TABS: 12.5000 mg | ORAL_TABLET | Freq: Every day | ORAL | 3 refills | Status: DC

## 2017-04-13 NOTE — Assessment & Plan Note (Addendum)
A1c today 6.9, relatively stable on metformin. - Will continue metformin -Discussed with patient that he can stop checking his blood sugars; and we will continue his management of his diabetes with only A1c when he comes in for his visits. -Patient has seen his ophthalmologist this year and will send me a report from his ophthalmologist.   - He is not interested in the pneumonia vaccine but however will take the flu -Follow-up in about 2 months.

## 2017-04-13 NOTE — Assessment & Plan Note (Signed)
Blood pressure well controlled -Currently on valsartan hydrochlorothiazide combo -Patient stating that he has had lip swelling over the past few weeks this is concerning for angioedema -Will stop  valsartan and continue patient on hydrochlorothiazide 25 mg -Follow-up with patient in about 2 months per his request

## 2017-04-13 NOTE — Patient Instructions (Signed)
I am worried that the blood pressure medication you were on before was causing swelling in your lower lips.  I am going to stop that blood pressure medication and start you on hydrochlorothiazide you will take this once daily.  As I discussed with you you can stop checking your blood sugars altogether.  We will just use your A1c in the future for management of your diabetes.  You have done a great job with this.  As discussed with you I want you to call your eye doctor and have them send over your eye exam.  Please have them send [facility] to [name], with attention to Dr. Emmaline Life.  You can follow-up with me in the next 1-2 months.  Will check on your blood pressure at that time to make sure that the new medication I have started you on is doing well

## 2017-04-13 NOTE — Assessment & Plan Note (Signed)
Concerning for angioedema will stop valsartan

## 2017-04-13 NOTE — Progress Notes (Signed)
   [name] Kerrin Mo, MD Phone: [phone]  Reason For Visit: Follow up    # CHRONIC DM, Type 2: CBGs: CBGs 120s avg - couple of 41s, 65 x 1  Meds: Currently only on metformin  Reports good compliance. Tolerating well w/o side-effects Currently on ACEi / ARB  - planning to stop as patient complaining of about two weeks of lip swelling  Lifestyle: Has lost weight over the last couple years. has really tried to improve his diet. Denies polyuria, visual changes, numbness or tingling.  #CHRONIC HTN: Reports recently patient has been complaining of lip swelling and his numbness feeling weird.  This has happened a couple of times in the last couple of weeks. Current Meds - Diovan Reports good compliance,took meds today. Tolerating well, w/o complaints. Lifestyle -as above Denies CP, dyspnea, HA, edema, dizziness / lightheadedness  #Lip swelling  -States that he has had recent lip swelling that has happened randomly.  He states both of his lips have become swollen and large.  He denies any new foods or new creams on his skin.  He has no new medications on his list.  He is currently on an ARB.  He has never had anything like this before he states that he had did not have trouble with his breathing.  He states that his gums did feel weird and tingly.  States that it kind of resolves after several hours on its own.  No rashes noted.  No fevers no chills.  No nausea, no vomiting.  Past Medical History Reviewed problem list.  Medications- reviewed and updated No additions to family history Social history- patient is a non-smoker  Objective: BP 118/68   Pulse (!) 112   Temp 98.7 F (37.1 C) (Oral)   Ht 6\' 2"  (1.88 m)   Wt 174 lb (78.9 kg)   SpO2 97%   BMI 22.34 kg/m  Gen: NAD, alert, cooperative with exam HEENT: Normal, no swelling on lips today.  Normal gums no ulcers.    Neck: No masses palpated. No lymphadenopathy    Throat: moist mucus membranes, no  erythema Cardio: regular rate and rhythm, S1S2 heard, no murmurs appreciated Pulm: clear to auscultation bilaterally, no wheezes, rhonchi or rales Skin: dry, intact, no rashes or lesions   Assessment/Plan: See problem based a/p  Diabetes mellitus A1c today 6.9, relatively stable on metformin. - Will continue metformin -Discussed with patient that he can stop checking his blood sugars; and we will continue his management of his diabetes with only A1c when he comes in for his visits. -Patient has seen his ophthalmologist this year and will send me a report from his ophthalmologist.   - He is not interested in the pneumonia vaccine but however will take the flu -Follow-up in about 2 months.  Essential hypertension Blood pressure well controlled -Currently on valsartan hydrochlorothiazide combo -Patient stating that he has had lip swelling over the past few weeks this is concerning for angioedema -Will stop  valsartan and continue patient on hydrochlorothiazide 25 mg -Follow-up with patient in about 2 months per his request   Lip swelling Concerning for angioedema will stop valsartan

## 2017-04-14 LAB — CBC
Hematocrit: 40.1 % (ref 37.5–51.0)
Hemoglobin: 12.7 g/dL — ABNORMAL LOW (ref 13.0–17.7)
MCH: 28.6 pg (ref 26.6–33.0)
MCHC: 31.7 g/dL (ref 31.5–35.7)
MCV: 90 fL (ref 79–97)
Platelets: 217 10*3/uL (ref 150–379)
RBC: 4.44 x10E6/uL (ref 4.14–5.80)
RDW: 14.2 % (ref 12.3–15.4)
WBC: 4.3 10*3/uL (ref 3.4–10.8)

## 2017-04-16 ENCOUNTER — Encounter (HOSPITAL_COMMUNITY): Payer: Self-pay | Admitting: Emergency Medicine

## 2017-04-16 ENCOUNTER — Emergency Department (HOSPITAL_COMMUNITY)
Admission: EM | Admit: 2017-04-16 | Discharge: 2017-04-16 | Disposition: A | Discharge status: Home / Self Care | Payer: Medicare Other | Attending: Emergency Medicine | Admitting: Emergency Medicine

## 2017-04-16 DIAGNOSIS — Z7982 Long term (current) use of aspirin: Secondary | ICD-10-CM | POA: Insufficient documentation

## 2017-04-16 DIAGNOSIS — I10 Essential (primary) hypertension: Secondary | ICD-10-CM | POA: Insufficient documentation

## 2017-04-16 DIAGNOSIS — E119 Type 2 diabetes mellitus without complications: Secondary | ICD-10-CM | POA: Diagnosis not present

## 2017-04-16 DIAGNOSIS — Z87891 Personal history of nicotine dependence: Secondary | ICD-10-CM | POA: Diagnosis not present

## 2017-04-16 DIAGNOSIS — K130 Diseases of lips: Secondary | ICD-10-CM | POA: Diagnosis present

## 2017-04-16 DIAGNOSIS — R220 Localized swelling, mass and lump, head: Secondary | ICD-10-CM

## 2017-04-16 DIAGNOSIS — Z79899 Other long term (current) drug therapy: Secondary | ICD-10-CM | POA: Insufficient documentation

## 2017-04-16 DIAGNOSIS — Z7984 Long term (current) use of oral hypoglycemic drugs: Secondary | ICD-10-CM | POA: Diagnosis not present

## 2017-04-16 NOTE — ED Provider Notes (Signed)
[facility] DEPT Provider Note   CSN: [id number] Arrival date & time: 04/16/17  1014     History   Chief Complaint Chief Complaint  Patient presents with  . swollen gums    HPI [name] is a 76 y.o. male.  HPI Pt presents with swelling of his lower lip without SOB that has occurred most evenings for the past two weeks. By morning time his swelling has resolved. No new medications. Denies OTC NSAIDs. No hx of angioedema. Sometimes has itching and hives associated with this. No symptoms now. No difficulty breathing. No swelling now   Past Medical History:  Diagnosis Date  . Allergic rhinitis   . Anemia   . B12 deficiency   . BPH (benign prostatic hyperplasia)    Nesi  . Diabetes ([place])   . Gout   . Hyperlipidemia   . Hypertension   . Mallory-Weiss tear   . Thyroid disorder   . 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 Lip swelling 04/13/2017  . Hyperlipidemia 12/20/2016  . Memory loss 07/06/2016  . Health care maintenance 10/22/2015  . Neck problem 05/08/2015  . Insomnia 01/09/2015  . Diabetes mellitus ([place]) 06/05/2014  . B12 DEFICIENCY 08/14/2010  . 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 ANEMIA-IRON DEFICIENCY 07/07/2009  . Essential hypertension 04/21/2007    Past Surgical History:  Procedure Laterality Date  . CHOLECYSTECTOMY  1960  . COLON SURGERY     right hemi-colectomy  . COLONOSCOPY  multiple  . ESOPHAGOGASTRODUODENOSCOPY         Home Medications    Prior to Admission medications   Medication Sig Start Date End Date Taking? Authorizing Provider  ACCU-CHEK SOFTCLIX LANCETS lancets TEST 3 TIMES A DAY 05/11/16   Mikell, Jeani Sow, MD  aspirin EC 81 MG tablet Take 1 tablet (81 mg total) by mouth daily. 01/20/17   Mikell, Jeani Sow, MD  Blood Glucose Monitoring Suppl (ACCU-CHEK AVIVA) device Use as instructed 04/22/16 04/22/17  Tonette Bihari, MD  doxazosin (CARDURA XL) 8 MG 24 hr tablet Take 8 mg by mouth daily with breakfast.    [provider]  finasteride (PROSCAR) 5 MG tablet Take 5 mg by mouth daily.    [provider]  glucose blood (ACCU-CHEK AVIVA PLUS) test strip TEST THREE TIMES DAILY (DIAG.CODE 250.01) 04/30/15   Rosemarie Ax, MD  glucose blood (ACCU-CHEK AVIVA PLUS) test strip 1 each by Other route 3 (three) times daily. for testing 03/17/17   Tonette Bihari, MD  glucose blood test strip 1 each by Other route as needed for other. Use as instructed    [provider]  hydrochlorothiazide (HYDRODIURIL) 25 MG tablet Take 1 tablet (25 mg total) daily by mouth. 04/13/17   Mikell, Jeani Sow, MD  metFORMIN (GLUCOPHAGE) 500 MG tablet Take 2 tablets (1,000 mg total) by mouth 2 (two) times daily with a meal. 01/24/17   Mikell, Jeani Sow, MD  ONE [facility] ULTRA TEST test strip  07/23/10   [provider]  pravastatin (PRAVACHOL) 80 MG tablet Take 1 tablet (80 mg total) by mouth daily. 12/20/16   Tonette Bihari, MD    Family History Family History  Problem Relation Age of Onset  . Diabetes Other        nephew  . Colon cancer Neg Hx     Social History Social History   Tobacco Use  .  Smoking status: Former Smoker    Packs/day: 1.00    Years: 30.00    Pack years: 30.00    Types: Cigarettes    Last attempt to quit: 06/07/2001    Years since quitting: 15.8  . Smokeless tobacco: Former Systems developer    Types: Chew    Quit date: 06/07/2001  Substance Use Topics  . Alcohol use: No    Alcohol/week: 0.0 oz    Comment: quit about 10 years ago   . Drug use: No     Allergies   Ace inhibitors and Valsartan   Review of Systems Review of Systems  All other systems reviewed and are negative.    Physical Exam Updated Vital Signs BP (!) 141/96 (BP Location: Right Arm)   Pulse (!) 112   Temp 98.1 F (36.7 C) (Oral)   Resp 16   SpO2 98%   Physical Exam  Constitutional:  He is oriented to person, place, and time. He appears well-developed and well-nourished.  HENT:  Head: Normocephalic.  Edentulous. Tolerating secretions. No lip swelling or tenderness.   Eyes: EOM are normal.  Neck: Normal range of motion.  Pulmonary/Chest: Effort normal.  Abdominal: He exhibits no distension.  Musculoskeletal: Normal range of motion.  Neurological: He is alert and oriented to person, place, and time.  Psychiatric: He has a normal mood and affect.  Nursing note and vitals reviewed.    ED Treatments / Results  Labs (all labs ordered are listed, but only abnormal results are displayed) Labs Reviewed - No data to display  EKG  EKG Interpretation None       Radiology No results found.  Procedures Procedures (including critical care time)  Medications Ordered in ED Medications - No data to display   Initial Impression / Assessment and Plan / ED Course  I have reviewed the triage vital signs and the nursing notes.  Pertinent labs & imaging results that were available during my care of the patient were reviewed by me and considered in my medical decision making (see chart for details).     Asymptomatic at this time with a normal oral and facial examination. pcp followup . May be allergic. No meds or OTC meds to suggest angioedema. Difficult evaluation now as patient asymptomatic without any physical exam findings. Recommended return to the ER with symptomatic so we can better evaluate issue  Final Clinical Impressions(s) / ED Diagnoses   Final diagnoses:  Lip swelling    ED Discharge Orders    None       Jola Schmidt, MD 04/16/17 1055

## 2017-04-16 NOTE — Discharge Instructions (Signed)
Return to the ER if you develop symptoms.

## 2017-04-16 NOTE — ED Notes (Signed)
Bed: WTR5 Expected date:  Expected time:  Means of arrival:  Comments: 

## 2017-04-16 NOTE — ED Triage Notes (Signed)
Per pt, states swollen gums for 2 weeks-states he doesn't wear any teeth-not sure if it is related to a new medication-no respiratory distress

## 2017-04-18 ENCOUNTER — Encounter: Payer: Self-pay | Admitting: Internal Medicine

## 2017-04-19 ENCOUNTER — Other Ambulatory Visit: Payer: Self-pay

## 2017-04-19 ENCOUNTER — Encounter (HOSPITAL_COMMUNITY): Payer: Self-pay | Admitting: *Deleted

## 2017-04-19 ENCOUNTER — Emergency Department (HOSPITAL_COMMUNITY)
Admission: EM | Admit: 2017-04-19 | Discharge: 2017-04-19 | Disposition: A | Discharge status: Home / Self Care | Payer: Medicare Other | Attending: Emergency Medicine | Admitting: Emergency Medicine

## 2017-04-19 DIAGNOSIS — T7840XA Allergy, unspecified, initial encounter: Secondary | ICD-10-CM | POA: Insufficient documentation

## 2017-04-19 DIAGNOSIS — Z7984 Long term (current) use of oral hypoglycemic drugs: Secondary | ICD-10-CM | POA: Insufficient documentation

## 2017-04-19 DIAGNOSIS — I10 Essential (primary) hypertension: Secondary | ICD-10-CM | POA: Insufficient documentation

## 2017-04-19 DIAGNOSIS — Z7982 Long term (current) use of aspirin: Secondary | ICD-10-CM | POA: Diagnosis not present

## 2017-04-19 DIAGNOSIS — L509 Urticaria, unspecified: Secondary | ICD-10-CM | POA: Insufficient documentation

## 2017-04-19 DIAGNOSIS — R600 Localized edema: Secondary | ICD-10-CM | POA: Insufficient documentation

## 2017-04-19 DIAGNOSIS — Z87891 Personal history of nicotine dependence: Secondary | ICD-10-CM | POA: Diagnosis not present

## 2017-04-19 DIAGNOSIS — E119 Type 2 diabetes mellitus without complications: Secondary | ICD-10-CM | POA: Diagnosis not present

## 2017-04-19 MED ORDER — HYDROXYZINE HCL 25 MG PO TABS: 25.0000 mg | ORAL_TABLET | Freq: Four times a day (QID) | ORAL | 0 refills | Status: DC

## 2017-04-19 MED ORDER — HYDROXYZINE HCL 25 MG PO TABS
25.0000 mg | ORAL_TABLET | Freq: Once | ORAL | Status: AC
  Administered 2017-04-19: 25 mg via ORAL
  Filled 2017-04-19: qty 1

## 2017-04-19 NOTE — ED Notes (Signed)
ED Provider at bedside. 

## 2017-04-19 NOTE — ED Provider Notes (Addendum)
[facility] EMERGENCY DEPARTMENT Provider Note   CSN: [id number] Arrival date & time: 04/19/17  4627     History   Chief Complaint Chief Complaint  Patient presents with  . Allergic Reaction    HPI [name] is a 76 y.o. male.  Pt presents to the ED today with sx of facial swelling and hives all over.  Pt said these sx have been going on for weeks.  He said it initially started in his lip, but now it is his whole face.  Pt describes that his whole entire face gets swollen, then he breaks out in hives all over his body.  He denies any sob or difficulty swallowing when his face swells.  He said sx are intermittent.  He has not taken any meds for his sx.  He has no sx now.  He has been to the ED and to his pcp.  His pcp Dr. Emmaline Life at the [facility] stopped his diovan and changed him to HCTZ to see if that would help.  It has not.  Pt denies any other new meds.  He denies any new personal care products or laundry detergent.  He has not associated the hives with any particular food.      Past Medical History:  Diagnosis Date  . Allergic rhinitis   . Anemia   . B12 deficiency   . BPH (benign prostatic hyperplasia)    Nesi  . Diabetes ([place])   . Gout   . Hyperlipidemia   . Hypertension   . Mallory-Weiss tear   . Thyroid disorder   . 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 Lip swelling 04/13/2017  . Hyperlipidemia 12/20/2016  . Memory loss 07/06/2016  . Health care maintenance 10/22/2015  . Neck problem 05/08/2015  . Insomnia 01/09/2015  . Diabetes mellitus ([place]) 06/05/2014  . B12 DEFICIENCY 08/14/2010  . 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 ANEMIA-IRON DEFICIENCY 07/07/2009  . Essential hypertension 04/21/2007    Past Surgical History:  Procedure Laterality Date  . CHOLECYSTECTOMY  1960  . COLON SURGERY     right hemi-colectomy  . COLONOSCOPY  multiple  .  ESOPHAGOGASTRODUODENOSCOPY         Home Medications    Prior to Admission medications   Medication Sig Start Date End Date Taking? Authorizing Provider  ACCU-CHEK SOFTCLIX LANCETS lancets TEST 3 TIMES A DAY 05/11/16   Mikell, Jeani Sow, MD  aspirin EC 81 MG tablet Take 1 tablet (81 mg total) by mouth daily. 01/20/17   Mikell, Jeani Sow, MD  Blood Glucose Monitoring Suppl (ACCU-CHEK AVIVA) device Use as instructed 04/22/16 04/22/17  Tonette Bihari, MD  doxazosin (CARDURA XL) 8 MG 24 hr tablet Take 8 mg by mouth daily with breakfast.    [provider]  finasteride (PROSCAR) 5 MG tablet Take 5 mg by mouth daily.    [provider]  glucose blood (ACCU-CHEK AVIVA PLUS) test strip TEST THREE TIMES DAILY (DIAG.CODE 250.01) 04/30/15   Rosemarie Ax, MD  glucose blood (ACCU-CHEK AVIVA PLUS) test strip 1 each by Other route 3 (three) times daily. for testing 03/17/17   Tonette Bihari, MD  glucose blood test strip 1 each by Other route as needed for other. Use as instructed    [provider]  hydrochlorothiazide (HYDRODIURIL) 25 MG tablet Take 1 tablet (25 mg total) daily by mouth. 04/13/17  Tonette Bihari, MD  hydrOXYzine (ATARAX/VISTARIL) 25 MG tablet Take 1 tablet (25 mg total) every 6 (six) hours by mouth. 04/19/17   Isla Pence, MD  metFORMIN (GLUCOPHAGE) 500 MG tablet Take 2 tablets (1,000 mg total) by mouth 2 (two) times daily with a meal. 01/24/17   Mikell, Jeani Sow, MD  ONE [facility] ULTRA TEST test strip  07/23/10   [provider]  pravastatin (PRAVACHOL) 80 MG tablet Take 1 tablet (80 mg total) by mouth daily. 12/20/16   Tonette Bihari, MD    Family History Family History  Problem Relation Age of Onset  . Diabetes Other        nephew  . Colon cancer Neg Hx     Social History Social History   Tobacco Use  . Smoking status: Former Smoker    Packs/day: 1.00    Years: 30.00    Pack years: 30.00    Types:  Cigarettes    Last attempt to quit: 06/07/2001    Years since quitting: 15.8  . Smokeless tobacco: Former Systems developer    Types: Chew    Quit date: 06/07/2001  Substance Use Topics  . Alcohol use: No    Alcohol/week: 0.0 oz    Comment: quit about 10 years ago   . Drug use: No     Allergies   Ace inhibitors and Valsartan   Review of Systems Review of Systems  Skin: Positive for rash.  All other systems reviewed and are negative.    Physical Exam Updated Vital Signs BP 126/80   Pulse 99   Temp 98.6 F (37 C) (Oral)   Resp 17   SpO2 93%   Physical Exam  Constitutional: He is oriented to person, place, and time. He appears well-developed and well-nourished.  HENT:  Head: Normocephalic and atraumatic.  Right Ear: External ear normal.  Left Ear: External ear normal.  Nose: Nose normal.  Mouth/Throat: Oropharynx is clear and moist.  Eyes: Conjunctivae and EOM are normal. Pupils are equal, round, and reactive to light.  Neck: Normal range of motion. Neck supple.    Cardiovascular: Normal rate, regular rhythm, normal heart sounds and intact distal pulses.  Pulmonary/Chest: Effort normal and breath sounds normal.  Abdominal: Soft. Bowel sounds are normal.  Musculoskeletal: Normal range of motion.  Neurological: He is alert and oriented to person, place, and time.  Skin: Skin is warm. Capillary refill takes less than 2 seconds.  Psychiatric: He has a normal mood and affect. His behavior is normal. Judgment and thought content normal.  Nursing note and vitals reviewed.    ED Treatments / Results  Labs (all labs ordered are listed, but only abnormal results are displayed) Labs Reviewed - No data to display  EKG  EKG Interpretation None       Radiology No results found.  Procedures Procedures (including critical care time)  Medications Ordered in ED Medications  hydrOXYzine (ATARAX/VISTARIL) tablet 25 mg (not administered)     Initial Impression / Assessment and  Plan / ED Course  I have reviewed the triage vital signs and the nursing notes.  Pertinent labs & imaging results that were available during my care of the patient were reviewed by me and considered in my medical decision making (see chart for details).    Pt looks good now.  He has no hives or facial swelling.  He is describing what sounds to be an allergic reaction.  To what is unclear.  He is instructed to f/u  with allergy and with pcp.  He is given a rx for atarax to take when the hives start.  He knows to return if worse.  Pt did develop a few hives to his right arm prior to d/c.  He was given an atarax prior to d/c.  He has not eaten anything recently and he had not been given any meds here prior to hive forming.  Final Clinical Impressions(s) / ED Diagnoses   Final diagnoses:  Allergic reaction, initial encounter    ED Discharge Orders        Ordered    hydrOXYzine (ATARAX/VISTARIL) 25 MG tablet  Every 6 hours     04/19/17 1224       Isla Pence, MD 04/19/17 1234    Isla Pence, MD 04/19/17 1238

## 2017-04-19 NOTE — ED Triage Notes (Signed)
Pt is here with swelling to face at night and breaks out into hives all over and states this has been going on for one week.  Pt states he cannot sleep at night.  Pt has some lower mouth swelling and itching on legs.  No sob.   Pt has been seen already and they changed him off of one bp med onto HCTZ.   Pt states it keeps happening.

## 2017-04-20 ENCOUNTER — Telehealth: Payer: Self-pay | Admitting: Internal Medicine

## 2017-04-20 NOTE — Telephone Encounter (Signed)
Patient came by office & has questions in regards ER visit. Patient would like to discuss meds given. Patient may be reached at [phone]

## 2017-04-21 NOTE — Telephone Encounter (Signed)
We had no openings, scheduled him to see you at your next available appt. Marilin Kofman Kennon Holter, CMA

## 2017-04-21 NOTE — Telephone Encounter (Signed)
Please call patient to make an appointment for him to be seen either today or tomorrow for back/neck pain.

## 2017-05-12 ENCOUNTER — Encounter: Payer: Self-pay | Admitting: Internal Medicine

## 2017-05-12 ENCOUNTER — Other Ambulatory Visit: Payer: Self-pay

## 2017-05-12 ENCOUNTER — Ambulatory Visit: Payer: Medicare Other | Admitting: Internal Medicine

## 2017-05-12 VITALS — BP 120/60 | HR 88 | Temp 98.1°F | Ht 74.0 in | Wt 171.0 lb

## 2017-05-12 DIAGNOSIS — D509 Iron deficiency anemia, unspecified: Secondary | ICD-10-CM

## 2017-05-12 DIAGNOSIS — R6889 Other general symptoms and signs: Secondary | ICD-10-CM | POA: Diagnosis not present

## 2017-05-12 DIAGNOSIS — M1990 Unspecified osteoarthritis, unspecified site: Secondary | ICD-10-CM | POA: Insufficient documentation

## 2017-05-12 DIAGNOSIS — L508 Other urticaria: Secondary | ICD-10-CM | POA: Diagnosis not present

## 2017-05-12 DIAGNOSIS — M47812 Spondylosis without myelopathy or radiculopathy, cervical region: Secondary | ICD-10-CM | POA: Diagnosis not present

## 2017-05-12 MED ORDER — HYDROXYZINE HCL 25 MG PO TABS: 25.0000 mg | ORAL_TABLET | Freq: Four times a day (QID) | ORAL | 0 refills | Status: DC | PRN

## 2017-05-12 MED ORDER — FAMOTIDINE 20 MG PO TABS: 20.0000 mg | ORAL_TABLET | Freq: Two times a day (BID) | ORAL | 2 refills | Status: DC

## 2017-05-12 MED ORDER — FERROUS SULFATE 325 (65 FE) MG PO TABS: 325.0000 mg | ORAL_TABLET | Freq: Every day | ORAL | 2 refills | Status: DC

## 2017-05-12 MED ORDER — CETIRIZINE HCL 10 MG PO TABS: 10.0000 mg | ORAL_TABLET | Freq: Every day | ORAL | 5 refills | Status: DC

## 2017-05-12 NOTE — Progress Notes (Signed)
[facility] Kerrin Mo, MD Phone: [phone]  Reason For Visit: Follow up   # Anemia  - Colonoscopy on 12/2015 - 10 mm polyp adeno removed but not recovered - per patient no further follow up  - No sources of bleeding - Patient use to take iron pills a long time ago, did not needed anymore  - No dizziness, no heart palpations, no SOB   # Hives and Lip swelling  - 3-4 times of lip swelling and hives  - Happens usually at night and wake up in the morning with hives - Hives all over, they are very itchy  - Hydrxizyine helps with the attacks  - Denies any changes in diet or changes in topical changes  - No SOB, no swelling in throat, no ulcers, no fevers,  # Neck Pain  - Patient states he has been having some neck clicking  - States that his neck pops at times  - No pain, no numbness, normal upper extremity  Past Medical History Reviewed problem list.  Medications- reviewed and updated No additions to family history Social history- patient is a non- smoker  Objective: BP 120/60 (BP Location: Right Arm, Patient Position: Sitting, Cuff Size: Normal)   Pulse 88   Temp 98.1 F (36.7 C) (Oral)   Ht 6\' 2"  (1.88 m)   Wt 171 lb (77.6 kg)   SpO2 98%   BMI 21.96 kg/m  Gen: NAD, alert, cooperative with exam Cardio: regular rate and rhythm, S1S2 heard, no murmurs appreciated Pulm: clear to auscultation bilaterally, no wheezes, rhonchi or rales GI: soft, non-tender, non-distended, bowel sounds present, no hepatomegaly, no splenomegaly Skin: dry, intact, no rashes or lesions NecK: No tenderness along spinal processes, no pain along paraspinal processes, no abnormalities noted on range of motion, 5 out of 5 strength bilaterally in upper extremities, no numbness or tingling   Assessment/Plan: See problem based a/p  Chronic urticaria Persistent urticaria over the last 3-4 weeks.  With several couple episodes of lip swelling and urticaria all over body  usually occurs when patient wakes up in the morning or at night no concern for anaphylaxis. no new medications, no new exposures.  Will stop 81 mg of aspirin for the next couple of weeks to see if that helps improve things.  Already taken patient off ACE previously without much improvement.  Patient is not taking NSAIDs - cetirizine (ZYRTEC) 10 MG tablet; Take 1 tablet (10 mg total) by mouth daily.  Dispense: 30 tablet; Refill: 5 - famotidine (PEPCID) 20 MG tablet; Take 1 tablet (20 mg total) by mouth 2 (two) times daily.  Dispense: 60 tablet; Refill: 2 - Ambulatory referral to Allergy - hydrOXYzine (ATARAX/VISTARIL) 25 MG tablet; Take 1 tablet (25 mg total) by mouth every 6 (six) hours as needed for itching (Hives).  Dispense: 30 tablet; Refill: 0   Neck problem Previously seen by Dr. Raeford Razor for this issue. States he has popping in his neck.  Would like an neck x-ray as he worries there is some abnormality.  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  However for peace of mind we will get an x-ray  ANEMIA-IRON DEFICIENCY History of iron deficiency anemia he has had a recent colonoscopy which noted one polyp 10 mm, no biopsy was obtained from removal of this polyp unfortunately.  Per patient and records no further follow-up with GI needed.  Very mild anemia. Will start patient on iron.  Per discussion with  Dr. Andria Frames could be due to testosterone levels.  Consider an anemia panel in the future

## 2017-05-12 NOTE — Patient Instructions (Addendum)
It was nice to see you today.  I want you to start taking iron supplementation.  for your swelling and rash I want you to take it daily Zyrtec and Pepcid twice daily.  When you have a episode you can also take Atarax.  I am going to send you a referral to an allergist.  For your neck we will get a cervical x-ray.  Please make an appointment for February or sooner if needed

## 2017-05-13 NOTE — Assessment & Plan Note (Signed)
History of iron deficiency anemia he has had a recent colonoscopy which noted one polyp 10 mm, no biopsy was obtained from removal of this polyp unfortunately.  Per patient and records no further follow-up with GI needed.  Very mild anemia. Will start patient on iron.  Per discussion with Dr. Andria Frames could be due to testosterone levels.  Consider an anemia panel in the future

## 2017-05-13 NOTE — Assessment & Plan Note (Signed)
Persistent urticaria over the last 3-4 weeks.  With several couple episodes of lip swelling and urticaria all over body usually occurs when patient wakes up in the morning or at night no concern for anaphylaxis. no new medications, no new exposures.  Will stop 81 mg of aspirin for the next couple of weeks to see if that helps improve things.  Already taken patient off ACE previously without much improvement.  Patient is not taking NSAIDs - cetirizine (ZYRTEC) 10 MG tablet; Take 1 tablet (10 mg total) by mouth daily.  Dispense: 30 tablet; Refill: 5 - famotidine (PEPCID) 20 MG tablet; Take 1 tablet (20 mg total) by mouth 2 (two) times daily.  Dispense: 60 tablet; Refill: 2 - Ambulatory referral to Allergy - hydrOXYzine (ATARAX/VISTARIL) 25 MG tablet; Take 1 tablet (25 mg total) by mouth every 6 (six) hours as needed for itching (Hives).  Dispense: 30 tablet; Refill: 0

## 2017-05-13 NOTE — Assessment & Plan Note (Signed)
Previously seen by Dr. Raeford Razor for this issue. States he has popping in his neck.  Would like an neck x-ray as he worries there is some abnormality.  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  However for peace of mind we will get an x-ray

## 2017-05-17 ENCOUNTER — Other Ambulatory Visit: Payer: Self-pay | Admitting: Internal Medicine

## 2017-05-17 DIAGNOSIS — L508 Other urticaria: Secondary | ICD-10-CM

## 2017-05-17 MED ORDER — HYDROXYZINE HCL 25 MG PO TABS: 25.0000 mg | ORAL_TABLET | Freq: Four times a day (QID) | ORAL | 0 refills | Status: DC | PRN

## 2017-06-03 MED ORDER — METFORMIN HCL 500 MG PO TABS: 1000.0000 mg | ORAL_TABLET | Freq: Two times a day (BID) | ORAL | 0 refills | Status: DC

## 2017-06-03 NOTE — Addendum Note (Signed)
Addended by: Kerrin Mo Z on: 06/03/2017 01:57 PM   Modules accepted: Orders

## 2017-06-16 ENCOUNTER — Other Ambulatory Visit: Payer: Self-pay | Admitting: *Deleted

## 2017-06-16 DIAGNOSIS — E785 Hyperlipidemia, unspecified: Secondary | ICD-10-CM

## 2017-06-16 MED ORDER — PRAVASTATIN SODIUM 80 MG PO TABS: 80.0000 mg | ORAL_TABLET | Freq: Every day | ORAL | 2 refills | Status: DC

## 2017-07-15 ENCOUNTER — Ambulatory Visit: Payer: Medicare Other | Admitting: Internal Medicine

## 2017-07-15 VITALS — BP 119/60 | HR 98 | Temp 98.2°F | Ht 74.0 in | Wt 171.8 lb

## 2017-07-15 DIAGNOSIS — E119 Type 2 diabetes mellitus without complications: Secondary | ICD-10-CM | POA: Diagnosis not present

## 2017-07-15 DIAGNOSIS — Z794 Long term (current) use of insulin: Secondary | ICD-10-CM | POA: Diagnosis not present

## 2017-07-15 DIAGNOSIS — D509 Iron deficiency anemia, unspecified: Secondary | ICD-10-CM | POA: Diagnosis not present

## 2017-07-15 DIAGNOSIS — I10 Essential (primary) hypertension: Secondary | ICD-10-CM

## 2017-07-15 DIAGNOSIS — L508 Other urticaria: Secondary | ICD-10-CM

## 2017-07-15 DIAGNOSIS — Z23 Encounter for immunization: Secondary | ICD-10-CM

## 2017-07-15 LAB — POCT GLYCOSYLATED HEMOGLOBIN (HGB A1C): Hemoglobin A1C: 7.5

## 2017-07-15 NOTE — Progress Notes (Signed)
   [name] Kerrin Mo, MD Phone: [phone]  Reason For Visit: Follow up   # CHRONIC DM, Type 2: Meds: Metformin 1000 mg BID,  Reports good compliance. Tolerating well w/o side-effects Lip swelling allergy to ACE/ARB Lifestyle: Eating well, tries to eat lots of veggies  Denies polyuria, visual changes, numbness or tingling.  # CHRONIC HTN: Current Meds - HCTZ   Reports good compliance, took meds today. Tolerating well, w/o complaints. Denies CP, dyspnea, HA, edema, dizziness / lightheadedness  #Urticaria  -Previously with significant urticaria that has been well controlled with Pepcid, Zyrtec and hydroxyzine -Patient never followed up with allergist as discussed as he stated he improved on these medications -He is wondering if he can stop these medications now  #Mild normocytic anemia  -Asymptomatic -Last colonoscopy 12/18/2015  Past Medical History Reviewed problem list.  Medications- reviewed and updated No additions to family history Social history- patient is a non-smoker  Objective: BP 119/60 (BP Location: Left Arm, Patient Position: Sitting, Cuff Size: Normal)   Pulse 98   Temp 98.2 F (36.8 C) (Oral)   Ht 6\' 2"  (1.88 m)   Wt 171 lb 12.8 oz (77.9 kg)   SpO2 98%   BMI 22.06 kg/m  Gen: NAD, alert, cooperative with exam Cardio: regular rate and rhythm, S1S2 heard, no murmurs appreciated Pulm: clear to auscultation bilaterally, no wheezes, rhonchi or rales Extremities: warm, well perfused, No edema, cyanosis or clubbing;  MSK: Normal gait and station Skin: dry, intact, no rashes or lesions   Assessment/Plan: See problem based a/p  Diabetes mellitus A1C 7.5  - Next eye appointment make sure to tell doc to send eye exam  - Continue metformin 1000mg  twice daily - obtain urine microalbumin   Essential hypertension Blood pressure well controlled Continue hydrochlorthiazide  Chronic urticaria Improved significantly, has not  had symptoms in over 6 weeks -Stop Pepcid, hydroxyzine, Zyrtec  ANEMIA-IRON DEFICIENCY History of mild normocytic anemia -Will obtain lab work -Continue new ferrous sulfate

## 2017-07-15 NOTE — Assessment & Plan Note (Addendum)
Improved significantly, has not had symptoms in over 6 weeks -Stop Pepcid, hydroxyzine, Zyrtec-can restart as needed for urticaria

## 2017-07-15 NOTE — Patient Instructions (Addendum)
I think you are doing really well.  You can stop this medications we gave you for rash since the rash has resolved.  I am going to check some lab work to make sure we are correctly treating your anemia.  Please follow-up in 3 months.  Please try to get the paperwork from the eye doctor next time you go.

## 2017-07-15 NOTE — Assessment & Plan Note (Signed)
A1C 7.5  - Next eye appointment make sure to tell doc to send eye exam  - Continue metformin 1000mg  twice daily - obtain urine microalbumin

## 2017-07-15 NOTE — Assessment & Plan Note (Signed)
Blood pressure well controlled Continue hydrochlorthiazide

## 2017-07-15 NOTE — Assessment & Plan Note (Signed)
History of mild normocytic anemia -Will obtain lab work -Continue new ferrous sulfate

## 2017-07-16 LAB — CBC
Hematocrit: 39 % (ref 37.5–51.0)
Hemoglobin: 12.6 g/dL — ABNORMAL LOW (ref 13.0–17.7)
MCH: 28.5 pg (ref 26.6–33.0)
MCHC: 32.3 g/dL (ref 31.5–35.7)
MCV: 88 fL (ref 79–97)
Platelets: 193 10*3/uL (ref 150–379)
RBC: 4.42 x10E6/uL (ref 4.14–5.80)
RDW: 13.4 % (ref 12.3–15.4)
WBC: 4.2 10*3/uL (ref 3.4–10.8)

## 2017-07-16 LAB — VITAMIN B12: Vitamin B-12: 209 pg/mL — ABNORMAL LOW (ref 232–1245)

## 2017-07-16 LAB — FOLATE: Folate: 7.4 ng/mL (ref >3.0)

## 2017-07-16 LAB — MICROALBUMIN, URINE: Microalbumin, Urine: 10.1 ug/mL

## 2017-07-16 LAB — FERRITIN: Ferritin: 113 ng/mL (ref 30–400)

## 2017-07-16 LAB — IRON AND TIBC
Iron Saturation: 26 % (ref 15–55)
Iron: 82 ug/dL (ref 38–169)
Total Iron Binding Capacity: 314 ug/dL (ref 250–450)
UIBC: 232 ug/dL (ref 111–343)

## 2017-07-18 ENCOUNTER — Ambulatory Visit (HOSPITAL_COMMUNITY)
Admission: RE | Admit: 2017-07-18 | Discharge: 2017-07-18 | Disposition: A | Discharge status: Home / Self Care | Payer: Medicare Other | Source: Ambulatory Visit | Attending: Family Medicine | Admitting: Family Medicine

## 2017-07-18 DIAGNOSIS — M47812 Spondylosis without myelopathy or radiculopathy, cervical region: Secondary | ICD-10-CM | POA: Diagnosis present

## 2017-07-19 ENCOUNTER — Encounter: Payer: Self-pay | Admitting: Internal Medicine

## 2017-07-21 ENCOUNTER — Telehealth: Payer: Self-pay | Admitting: Internal Medicine

## 2017-07-21 DIAGNOSIS — E538 Deficiency of other specified B group vitamins: Secondary | ICD-10-CM | POA: Insufficient documentation

## 2017-07-21 MED ORDER — VITAMIN B-12 1000 MCG PO TABS: 1000.0000 ug | ORAL_TABLET | Freq: Every day | ORAL | 0 refills | Status: DC

## 2017-07-21 NOTE — Telephone Encounter (Signed)
Called patient. B12 is low. Will start 1000 mcg of B12 for next month. Follow up in 1 month to recheck B12 level.   [name]

## 2017-08-02 ENCOUNTER — Telehealth: Payer: Self-pay | Admitting: *Deleted

## 2017-08-02 NOTE — Telephone Encounter (Signed)
Pharmacy requesting a 90 day supply for famotidine 20mg  tablet and ferrous sulfate 325mg  tablet not seen on med list. Please advise. Dalayla Aldredge Kennon Holter, CMA

## 2017-08-02 NOTE — Telephone Encounter (Signed)
Both of these medications have been stopped, no further refills are needed.  Please let pharmacy know

## 2017-08-03 NOTE — Telephone Encounter (Signed)
Pharmacy informed. Deseree Blount, CMA  

## 2017-08-12 ENCOUNTER — Ambulatory Visit: Payer: Medicare Other | Admitting: Allergy

## 2017-08-12 ENCOUNTER — Encounter: Payer: Self-pay | Admitting: Allergy

## 2017-08-12 VITALS — BP 130/70 | HR 80 | Temp 97.9°F | Resp 20 | Ht 71.5 in | Wt 172.0 lb

## 2017-08-12 DIAGNOSIS — L508 Other urticaria: Secondary | ICD-10-CM

## 2017-08-12 DIAGNOSIS — T783XXD Angioneurotic edema, subsequent encounter: Secondary | ICD-10-CM

## 2017-08-12 MED ORDER — FAMOTIDINE 20 MG PO TABS: 20.0000 mg | ORAL_TABLET | Freq: Two times a day (BID) | ORAL | 3 refills | Status: DC

## 2017-08-12 MED ORDER — CETIRIZINE HCL 10 MG PO TABS: 10.0000 mg | ORAL_TABLET | Freq: Two times a day (BID) | ORAL | 3 refills | Status: DC

## 2017-08-12 MED ORDER — CETIRIZINE HCL 10 MG PO TABS: 10.0000 mg | ORAL_TABLET | Freq: Every day | ORAL | 3 refills | Status: DC

## 2017-08-12 NOTE — Patient Instructions (Addendum)
Hives and swelling    - at this time etiology of hives and swelling is unknown.  Hives can be caused by a variety of different triggers including illness/infection, foods, medications, stings, exercise, pressure, vibrations, extremes of temperature to name a few however majority of the time there is no identifiable trigger.  Your symptoms have been ongoing for >6 weeks making this chronic thus will obtain labwork to evaluate:  CMP, tryptase, hive panel, environmental panel, alpha-gal panel.  We will call you with these results once they all return   - to help manage hives recommend taking your Cetirizine 10mg  - take 1 tab twice a day; Famotidine 20mg  - take 1 tab twice a day   - let us know if hives leave any marks/bruising, you have any fevers or any joint aches/pains  Follow-up 2-3 months or sooner if needed

## 2017-08-12 NOTE — Addendum Note (Signed)
Addended by: Herbie Drape on: 08/12/2017 01:26 PM   Modules accepted: Orders

## 2017-08-12 NOTE — Progress Notes (Signed)
New Patient Note  RE: [name] MRN: [id number] DOB: 12/03/1940 Date of Office Visit: 08/12/2017  Referring provider: Tonette Bihari, MD Primary care provider: Tonette Bihari, MD  Chief Complaint: hives and swelling  History of present illness: [name] is a 77 y.o. male presenting today for consultation for urticaria with angioedema.      He has been having hives and swelling now for at least the past 6 months.  He has had swelling of his lips with the hives.  He states the hives occur daily and usually are worse in the evening/night.  He describes the hives as "welts" that are very itchy and he is "miserable".  The hives last several hours before resolving but can appear all over the body.  He denies any exacerbating factors.  He was advised to take Zyrtec 1 tab daily and famotidine 1 tab twice a day by his PCP which he states he did for a while but was only taking the famotidine once day.  He stopped both of these medications he believes about a week ago as he states he was doing better on the medications.  Since stopping hives have been more frequent and more intense.   He denies any fevers, no joint aches/pains and hives do not leave any marks or bruises.  He denies any preceding illness that he can recall and no new foods, medications, stings, or change in soaps/detergents/lotions.  He states he has not received any systemic steroids for his hives and swelling.     He denies a  history of asthma, seasonal or perennial allergies, eczema or food allergy.    Review of systems: Review of Systems  Constitutional: Negative for chills, fever and malaise/fatigue.  HENT: Negative for congestion, ear discharge, ear pain, nosebleeds, sinus pain, sore throat and tinnitus.   Eyes: Negative for pain, discharge and redness.  Respiratory: Negative for cough, shortness of breath and wheezing.   Cardiovascular: Negative for chest pain.  Gastrointestinal: Negative for  abdominal pain, diarrhea, heartburn, nausea and vomiting.  Musculoskeletal: Negative for joint pain.  Skin: Positive for itching and rash.  Neurological: Negative for headaches.    All other systems negative unless noted above in HPI  Past medical history: Past Medical History:  Diagnosis Date  . Allergic rhinitis   . Anemia   . Angio-edema   . B12 deficiency   . BPH (benign prostatic hyperplasia)    [place]  . Diabetes ([place])   . Gout   . Hyperlipidemia   . Hypertension   . Mallory-Weiss tear   . Thyroid disorder   . 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 Urticaria     Past surgical history: Past Surgical History:  Procedure Laterality Date  . ADENOIDECTOMY    . CHOLECYSTECTOMY  1960  . COLON SURGERY     right hemi-colectomy  . COLONOSCOPY  multiple  . ESOPHAGOGASTRODUODENOSCOPY    . TONSILLECTOMY      Family history:  Family History  Problem Relation Age of Onset  . Diabetes Other        nephew  . Colon cancer Neg Hx     Social history:  Occupational History  . Occupation: retired  Tobacco Use  . Smoking status: Former Smoker    Packs/day: 1.00    Years: 30.00    Pack years: 30.00    Types: Cigarettes    Last attempt to quit: 06/07/2001    Years since quitting: 16.1  . Smokeless  tobacco: Former Systems developer    Types: [place] date: 06/07/2001   Lives in [place] in an apartment home with electric heating and central cooling.   No concern for water damage, mildew or roaches in the home.      Medication List: Allergies as of 08/12/2017      Reactions   Ace Inhibitors Swelling   Valsartan Swelling      Medication List        Accurate as of 08/12/17 12:36 PM. Always use your most recent med list.          cetirizine 10 MG tablet Commonly known as:  ZYRTEC Take 1 tablet (10 mg total) by mouth daily. Take 1 tablet by mouth two times a day.   doxazosin 8 MG 24 hr tablet Commonly known as:  CARDURA XL Take 8 mg by mouth daily with breakfast.     famotidine 20 MG tablet Commonly known as:  PEPCID Take 1 tablet (20 mg total) by mouth 2 (two) times daily.   metFORMIN 500 MG tablet Commonly known as:  GLUCOPHAGE Take 2 tablets (1,000 mg total) by mouth 2 (two) times daily with a meal.   ONE TOUCH ULTRA TEST test strip Generic drug:  glucose blood   pravastatin 80 MG tablet Commonly known as:  PRAVACHOL Take 1 tablet (80 mg total) by mouth daily.   vitamin B-12 1000 MCG tablet Commonly known as:  CYANOCOBALAMIN Take 1 tablet (1,000 mcg total) by mouth daily.       Known medication allergies: Allergies  Allergen Reactions  . Ace Inhibitors Swelling  . Valsartan Swelling     Physical examination: Blood pressure 130/70, pulse 80, temperature 97.9 F (36.6 C), temperature source Oral, resp. rate 20, height 5' 11.5" (1.816 m), weight 172 lb (78 kg).  General: Alert, interactive, in no acute distress. HEENT: PERRLA, TMs pearly gray, turbinates minimally edematous without discharge, post-pharynx non erythematous. Neck: Supple without lymphadenopathy. Lungs: Clear to auscultation without wheezing, rhonchi or rales. {no increased work of breathing. CV: Normal S1, S2 without murmurs. Abdomen: Nondistended, nontender. Skin: Warm and dry, without lesions or rashes. Extremities:  No clubbing, cyanosis or edema. Neuro:   Grossly intact.  Diagnositics/Labs: Labs:  Component     Latest Ref Rng & Units 07/15/2017  WBC     3.4 - 10.8 x10E3/uL 4.2  RBC     4.14 - 5.80 x10E6/uL 4.42  Hemoglobin     13.0 - 17.7 g/dL 12.6 (L)  HCT     37.5 - 51.0 % 39.0  MCV     79 - 97 fL 88  MCH     26.6 - 33.0 pg 28.5  MCHC     31.5 - 35.7 g/dL 32.3  RDW     12.3 - 15.4 % 13.4  Platelets     150 - 379 x10E3/uL 193    Assessment and plan:   Urticaria with angioedema, chronic   - at this time etiology of hives and swelling is unknown.  Hives can be caused by a variety of different triggers including illness/infection, foods,  medications, stings, exercise, pressure, vibrations, extremes of temperature to name a few however majority of the time there is no identifiable trigger.  Your symptoms have been ongoing for >6 weeks making this chronic thus will obtain labwork to evaluate:  CMP, tryptase, hive panel, environmental panel, alpha-gal panel.  We will call you with these results once they all return   - to  help manage hives recommend taking your Cetirizine 10mg  - take 1 tab twice a day; Famotidine 20mg  - take 1 tab twice a day   - let us know if hives leave any marks/bruising, you have any fevers or any joint aches/pains  Follow-up 2-3 months or sooner if needed  I appreciate the opportunity to take part in [facility] care. Please do not hesitate to contact me with questions.  Sincerely,   Prudy Feeler, MD Allergy/Immunology [facility]

## 2017-08-26 LAB — ALLERGENS W/TOTAL IGE AREA 2
Alternaria Alternata IgE: <0.1 kU/L
Aspergillus Fumigatus IgE: <0.1 kU/L
Bermuda Grass IgE: <0.1 kU/L
Cat Dander IgE: <0.1 kU/L
Cedar, Mountain IgE: 0.36 kU/L — AB
Cladosporium Herbarum IgE: <0.1 kU/L
Cockroach, German IgE: 0.7 kU/L — AB
Common Silver Birch IgE: <0.1 kU/L
Cottonwood IgE: 0.11 kU/L — AB
D Farinae IgE: 0.86 kU/L — AB
D Pteronyssinus IgE: 1.25 kU/L — AB
Dog Dander IgE: <0.1 kU/L
Elm, American IgE: 0.19 kU/L — AB
IgE (Immunoglobulin E), Serum: 819 IU/mL — ABNORMAL HIGH (ref 6–495)
Johnson Grass IgE: 0.17 kU/L — AB
Maple/Box Elder IgE: <0.1 kU/L
Mouse Urine IgE: <0.1 kU/L
Oak, White IgE: <0.1 kU/L
Pecan, Hickory IgE: 0.18 kU/L — AB
Penicillium Chrysogen IgE: <0.1 kU/L
Pigweed, Rough IgE: 0.21 kU/L — AB
Ragweed, Short IgE: <0.1 kU/L
Sheep Sorrel IgE Qn: <0.1 kU/L
Timothy Grass IgE: <0.1 kU/L
White Mulberry IgE: 0.11 kU/L — AB

## 2017-08-26 LAB — ALPHA-GAL PANEL
Alpha Gal IgE*: <0.1 kU/L (ref <0.10)
Beef (Bos spp) IgE: 0.2 kU/L (ref <0.35)
Class Interpretation: 0
Lamb/Mutton (Ovis spp) IgE: 0.15 kU/L (ref <0.35)
Pork (Sus spp) IgE: <0.1 kU/L (ref <0.35)

## 2017-08-26 LAB — CHRONIC URTICARIA: cu index: 45.5 — ABNORMAL HIGH (ref <10)

## 2017-08-26 LAB — THYROGLOBULIN LEVEL: Thyroglobulin (TG-RIA): 478 ng/mL — ABNORMAL HIGH

## 2017-08-26 LAB — COMPREHENSIVE METABOLIC PANEL
ALT: 16 IU/L (ref 0–44)
AST: 19 IU/L (ref 0–40)
Albumin/Globulin Ratio: 1.2 (ref 1.2–2.2)
Albumin: 4.4 g/dL (ref 3.5–4.8)
Alkaline Phosphatase: 43 IU/L (ref 39–117)
BUN/Creatinine Ratio: 10 (ref 10–24)
BUN: 10 mg/dL (ref 8–27)
Bilirubin Total: 0.4 mg/dL (ref 0.0–1.2)
CO2: 23 mmol/L (ref 20–29)
Calcium: 10.5 mg/dL — ABNORMAL HIGH (ref 8.6–10.2)
Chloride: 99 mmol/L (ref 96–106)
Creatinine, Ser: 1 mg/dL (ref 0.76–1.27)
GFR calc Af Amer: 84 mL/min/{1.73_m2} (ref >59)
GFR calc non Af Amer: 72 mL/min/{1.73_m2} (ref >59)
Globulin, Total: 3.7 g/dL (ref 1.5–4.5)
Glucose: 83 mg/dL (ref 65–99)
Potassium: 3.8 mmol/L (ref 3.5–5.2)
Sodium: 138 mmol/L (ref 134–144)
Total Protein: 8.1 g/dL (ref 6.0–8.5)

## 2017-08-26 LAB — TSH: TSH: 1.24 u[IU]/mL (ref 0.450–4.500)

## 2017-08-26 LAB — THYROID PEROXIDASE ANTIBODY: Thyroperoxidase Ab SerPl-aCnc: 27 IU/mL (ref 0–34)

## 2017-08-26 LAB — TRYPTASE: Tryptase: 9.6 ug/L (ref 2.2–13.2)

## 2017-09-06 ENCOUNTER — Other Ambulatory Visit: Payer: Self-pay | Admitting: *Deleted

## 2017-09-07 MED ORDER — METFORMIN HCL 500 MG PO TABS: 1000.0000 mg | ORAL_TABLET | Freq: Two times a day (BID) | ORAL | 0 refills | Status: DC

## 2017-10-07 ENCOUNTER — Ambulatory Visit: Payer: Medicare Other | Admitting: Internal Medicine

## 2017-10-07 ENCOUNTER — Encounter: Payer: Self-pay | Admitting: Internal Medicine

## 2017-10-07 VITALS — BP 140/80 | HR 82 | Temp 98.0°F | Ht 71.5 in | Wt 169.0 lb

## 2017-10-07 DIAGNOSIS — L508 Other urticaria: Secondary | ICD-10-CM | POA: Diagnosis not present

## 2017-10-07 DIAGNOSIS — Z794 Long term (current) use of insulin: Secondary | ICD-10-CM

## 2017-10-07 DIAGNOSIS — I10 Essential (primary) hypertension: Secondary | ICD-10-CM

## 2017-10-07 DIAGNOSIS — T783XXD Angioneurotic edema, subsequent encounter: Secondary | ICD-10-CM

## 2017-10-07 DIAGNOSIS — E538 Deficiency of other specified B group vitamins: Secondary | ICD-10-CM | POA: Diagnosis not present

## 2017-10-07 DIAGNOSIS — E785 Hyperlipidemia, unspecified: Secondary | ICD-10-CM | POA: Diagnosis not present

## 2017-10-07 DIAGNOSIS — E119 Type 2 diabetes mellitus without complications: Secondary | ICD-10-CM | POA: Diagnosis not present

## 2017-10-07 LAB — POCT GLYCOSYLATED HEMOGLOBIN (HGB A1C): Hemoglobin A1C: 8

## 2017-10-07 MED ORDER — CETIRIZINE HCL 10 MG PO TABS: 10.0000 mg | ORAL_TABLET | Freq: Two times a day (BID) | ORAL | 3 refills | Status: DC

## 2017-10-07 MED ORDER — HYDROCHLOROTHIAZIDE 25 MG PO TABS: 25.0000 mg | ORAL_TABLET | Freq: Every day | ORAL | 3 refills | Status: DC

## 2017-10-07 MED ORDER — FAMOTIDINE 20 MG PO TABS: 20.0000 mg | ORAL_TABLET | Freq: Two times a day (BID) | ORAL | 3 refills | Status: DC

## 2017-10-07 MED ORDER — PRAVASTATIN SODIUM 80 MG PO TABS: 80.0000 mg | ORAL_TABLET | Freq: Every day | ORAL | 2 refills | Status: DC

## 2017-10-07 MED ORDER — VITAMIN B-12 1000 MCG PO TABS: 1000.0000 ug | ORAL_TABLET | Freq: Every day | ORAL | 4 refills | Status: DC

## 2017-10-07 MED ORDER — CETIRIZINE HCL 10 MG PO TABS: 10.0000 mg | ORAL_TABLET | Freq: Every day | ORAL | 3 refills | Status: DC

## 2017-10-07 MED ORDER — METFORMIN HCL 500 MG PO TABS: 1000.0000 mg | ORAL_TABLET | Freq: Two times a day (BID) | ORAL | 0 refills | Status: DC

## 2017-10-07 NOTE — Assessment & Plan Note (Signed)
Continue pravastatin 

## 2017-10-07 NOTE — Assessment & Plan Note (Signed)
Blood pressure okay  - Continue HCTZ

## 2017-10-07 NOTE — Patient Instructions (Signed)
I think your doing okay. I want you to follow up in 1 month

## 2017-10-07 NOTE — Assessment & Plan Note (Signed)
A1C slightly elevated 8.0,  still in the normal range for geriatric  - Continue metformin  - Will need foot exam and eye exam

## 2017-10-07 NOTE — Progress Notes (Signed)
   [facility] Kerrin Mo, MD Phone: [phone]  Reason For Visit: Follow up   # CHRONIC Hypertension  Current Meds - HCTZ  Reports good compliance,took meds today. Tolerating well, w/o complaints. Denies CP, dyspnea, HA, edema, dizziness / lightheadedness   #CHRONIC DM, Type 2: Meds: metformin 1000 mg BID  Reports good compliance. Tolerating well w/o side-effects Denies polyuria, visual changes, numbness or tingling.  # HYPERLIPIDEMIA Disease Monitoring: See symptoms for Hypertension Medications:Pravastatin   Notable for patient having some significant memory loss over the past year or 2.  We discussed this today he understands that he has had some memory loss in the past.   Past Medical History Reviewed problem list.  Medications- reviewed and updated No additions to family history  Objective: BP 140/80 (BP Location: Left Arm, Patient Position: Sitting, Cuff Size: Normal)   Pulse 82   Temp 98 F (36.7 C) (Oral)   Ht 5' 11.5" (1.816 m)   Wt 169 lb (76.7 kg)   SpO2 98%   BMI 23.24 kg/m  Gen: NAD, alert, cooperative with exam Cardio: regular rate and rhythm, S1S2 heard, no murmurs appreciated Pulm: clear to auscultation bilaterally, no wheezes, rhonchi or rales Skin: dry, intact, no rashes or lesions   Assessment/Plan: See problem based a/p  Hyperlipidemia Continue pravastatin   Diabetes mellitus A1C slightly elevated 8.0,  still in the normal range for geriatric  - Continue metformin  - Will need foot exam and eye exam   Essential hypertension Blood pressure okay  - Continue HCTZ

## 2017-10-10 ENCOUNTER — Telehealth: Payer: Self-pay

## 2017-10-10 NOTE — Telephone Encounter (Signed)
PCP wanted this stopped. Will not place refill.

## 2017-10-13 NOTE — Telephone Encounter (Signed)
Called pharmacy after receiving two additional requests for Ferrous Sulfate. It has been deactivated at pharmacy.  Called patient and also informed him to no longer take Ferrous Sulfate. He was a little confused because he said he had just picke dup a RX for it. However, he finally understood to stop taking it.Ozella Almond, CMA

## 2017-10-17 ENCOUNTER — Encounter: Payer: Self-pay | Admitting: Allergy

## 2017-10-17 ENCOUNTER — Ambulatory Visit: Payer: Medicare Other | Admitting: Allergy

## 2017-10-17 ENCOUNTER — Encounter: Payer: Medicare Other | Admitting: Allergy and Immunology

## 2017-10-17 DIAGNOSIS — L508 Other urticaria: Secondary | ICD-10-CM

## 2017-10-17 DIAGNOSIS — T783XXD Angioneurotic edema, subsequent encounter: Secondary | ICD-10-CM

## 2017-10-17 MED ORDER — CETIRIZINE HCL 10 MG PO TABS
10.0000 mg | ORAL_TABLET | Freq: Every day | ORAL | 1 refills | Status: DC
Start: 2017-10-17 — End: 2017-11-07

## 2017-10-17 MED ORDER — FAMOTIDINE 20 MG PO TABS: 20.0000 mg | ORAL_TABLET | Freq: Two times a day (BID) | ORAL | 0 refills | Status: DC

## 2017-10-17 MED ORDER — FAMOTIDINE 20 MG PO TABS: 20.0000 mg | ORAL_TABLET | Freq: Two times a day (BID) | ORAL | 1 refills | Status: DC

## 2017-10-17 MED ORDER — CETIRIZINE HCL 10 MG PO TABS
10.0000 mg | ORAL_TABLET | Freq: Every day | ORAL | 0 refills | Status: DC
Start: 2017-10-17 — End: 2017-11-07

## 2017-10-17 NOTE — Progress Notes (Signed)
Follow-up Note  RE: Dominico Rod MRN: [id number] DOB: 1941/03/23 Date of Office Visit: 10/17/2017   History of present illness: [name] is a 77 y.o. male presenting today for sick visit for facial swelling and pruritus.  He was last seen in the office on August 12, 2016 by myself.  He states he has run out of his cetirizine and famotidine on for about the past week.  He states when he was on these medications that his swelling and itch were under good control.  He states his facial swelling and pruritus started overnight and the itch she states is out of control.  He denies any difficulty breathing or swallowing.  He denies any GI or CV related symptoms with the facial swelling and hives.  He did not take any medications overnight as he states he did not know what he could take.  He requested his cetirizine from underneath be refilled for him today.  Review of systems: Review of Systems  Constitutional: Negative for chills, fever and malaise/fatigue.  HENT: Negative for congestion, ear discharge, ear pain, nosebleeds and sore throat.   Eyes: Negative for pain, discharge and redness.  Respiratory: Negative for cough, shortness of breath and wheezing.   Cardiovascular: Negative for chest pain.  Gastrointestinal: Negative for abdominal pain, constipation, diarrhea, nausea and vomiting.  Musculoskeletal: Negative for joint pain.  Skin: Positive for itching. Negative for rash.  Neurological: Negative for headaches.    All other systems negative unless noted above in HPI  Past medical/social/surgical/family history have been reviewed and are unchanged unless specifically indicated below.  No changes  Medication List: Allergies as of 10/17/2017      Reactions   Ace Inhibitors Swelling   Valsartan Swelling      Medication List        Accurate as of 10/17/17  5:50 PM. Always use your most recent med list.          cetirizine 10 MG tablet Commonly known as:  ZYRTEC Take  1 tablet (10 mg total) by mouth daily.   cetirizine 10 MG tablet Commonly known as:  ZYRTEC Take 1 tablet (10 mg total) by mouth daily.   doxazosin 8 MG 24 hr tablet Commonly known as:  CARDURA XL Take 8 mg by mouth daily with breakfast.   famotidine 20 MG tablet Commonly known as:  PEPCID Take 1 tablet (20 mg total) by mouth 2 (two) times daily.   famotidine 20 MG tablet Commonly known as:  PEPCID Take 1 tablet (20 mg total) by mouth 2 (two) times daily.   finasteride 5 MG tablet Commonly known as:  PROSCAR Take 5 mg by mouth daily.   hydrochlorothiazide 25 MG tablet Commonly known as:  HYDRODIURIL Take 1 tablet (25 mg total) by mouth daily.   metFORMIN 500 MG tablet Commonly known as:  GLUCOPHAGE Take 2 tablets (1,000 mg total) by mouth 2 (two) times daily with a meal.   ONE TOUCH ULTRA TEST test strip Generic drug:  glucose blood   pravastatin 80 MG tablet Commonly known as:  PRAVACHOL Take 1 tablet (80 mg total) by mouth daily.   vitamin B-12 1000 MCG tablet Commonly known as:  CYANOCOBALAMIN Take 1 tablet (1,000 mcg total) by mouth daily.       Known medication allergies: Allergies  Allergen Reactions  . Ace Inhibitors Swelling  . Valsartan Swelling     Physical examination: Blood pressure 116/74, pulse 83, temperature 97.8 F (36.6 C), temperature source Oral,  resp. rate 16, height 5\' 11"  (1.803 m), weight 173 lb 6.4 oz (78.7 kg), SpO2 95 %.  General: Alert, interactive, in no acute distress. HEENT: PERRLA, TMs pearly gray, turbinates minimally edematous without discharge, post-pharynx non erythematous.  Both upper and lower lips appear to be slightly edematous on exam today Neck: Supple without lymphadenopathy. Lungs: Clear to auscultation without wheezing, rhonchi or rales. {no increased work of breathing. CV: Normal S1, S2 without murmurs. Abdomen: Nondistended, nontender. Skin: Warm and dry, without lesions or rashes. Extremities:  No clubbing,  cyanosis or edema. Neuro:   Grossly intact.  Diagnositics/Labs: Labs:  Component     Latest Ref Rng & Units 08/12/2017  Class Description      Comment  IgE (Immunoglobulin E), Serum     6 - 495 IU/mL 819 (H)  D Pteronyssinus IgE     Class II kU/L 1.25 (A)  D Farinae IgE     Class II kU/L 0.86 (A)  Cat Dander IgE     Class 0 kU/L <0.10  Dog Dander IgE     Class 0 kU/L <0.10  [place] Grass IgE     Class 0 kU/L <0.10  Timothy Grass IgE     Class 0 kU/L <0.10  Johnson Grass IgE     Class 0/I kU/L 0.17 (A)  Cockroach, German IgE     Class II kU/L 0.70 (A)  Penicillium Chrysogen IgE     Class 0 kU/L <0.10  Cladosporium Herbarum IgE     Class 0 kU/L <0.10  Aspergillus Fumigatus IgE     Class 0 kU/L <0.10  Alternaria Alternata IgE     Class 0 kU/L <0.10  Maple/Box Elder IgE     Class 0 kU/L <0.10  Common Silver Wendee Copp IgE     Class 0 kU/L <0.10  Cedar, [place] IgE     Class I kU/L 0.36 (A)  Oak, White IgE     Class 0 kU/L <0.10  Elm, American IgE     Class 0/I kU/L 0.19 (A)  Cottonwood IgE     Class 0/I kU/L 0.11 (A)  Pecan, Hickory IgE     Class 0/I kU/L 0.18 (A)  White Mulberry IgE     Class 0/I kU/L 0.11 (A)  Ragweed, Short IgE     Class 0 kU/L <0.10  Pigweed, Rough IgE     Class 0/I kU/L 0.21 (A)  Sheep Sorrel IgE Qn     Class 0 kU/L <0.10  Mouse Urine IgE     Class 0 kU/L <0.10  Glucose     65 - 99 mg/dL 83  BUN     8 - 27 mg/dL 10  Creatinine     0.76 - 1.27 mg/dL 1.00  GFR, Est Non African American     >59 mL/min/1.73 72  GFR, Est African American     >59 mL/min/1.73 84  BUN/Creatinine Ratio     10 - 24 10  Sodium     134 - 144 mmol/L 138  Potassium     3.5 - 5.2 mmol/L 3.8  Chloride     96 - 106 mmol/L 99  CO2     20 - 29 mmol/L 23  Calcium     8.6 - 10.2 mg/dL 10.5 (H)  Total Protein     6.0 - 8.5 g/dL 8.1  Albumin     3.5 - 4.8 g/dL 4.4  Globulin, Total     1.5 - 4.5 g/dL 3.7  Albumin/Globulin Ratio     1.2 - 2.2 1.2  Total  Bilirubin     0.0 - 1.2 mg/dL 0.4  Alkaline Phosphatase     39 - 117 IU/L 43  AST     0 - 40 IU/L 19  ALT     0 - 44 IU/L 16  Beef (Bos spp) IgE     <0.35 kU/L 0.20  Class Interpretation      0/1  Lamb/Mutton (Ovis spp) IgE     <0.35 kU/L 0.15  Class Interpretation      0/1  Pork (Sus spp) IgE     <0.35 kU/L <0.10  Class Interpretation      0  Alpha Gal IgE*     <0.10 kU/L <0.10  Tryptase     2.2 - 13.2 ug/L 9.6  TSH     0.450 - 4.500 uIU/mL 1.240  Thyroperoxidase Ab SerPl-aCnc     0 - 34 IU/mL 27  THYROGLOBULIN (TG-RIA)     ng/mL 478 (H)  cu index     <10 45.5 (H)     Assessment and plan: Autoimmune urticaria and angioedema Acute flare off high-dose antihistamines   - at this time etiology of hives and swelling is due to autoimmune type with positive chronic urticarial index and thyroglobulin antibody.     - to help manage hives recommend taking your Cetirizine 10mg  - take 1 tab twice a day; Famotidine 20mg  - take 1 tab twice a day.   Will refill these today   - let us know if hives leave any marks/bruising, you have any fevers or any joint aches/pains   -We did discuss possibility of using prednisone to help with acute flare however given his history of diabetes we opted to just resume his antihistamine regimen  Follow-up 3 months or sooner if needed  I appreciate the opportunity to take part in [facility] care. Please do not hesitate to contact me with questions.  Sincerely,   Prudy Feeler, MD Allergy/Immunology [facility] [place] [facility] [place]

## 2017-10-17 NOTE — Progress Notes (Signed)
This encounter was created in error - please disregard.

## 2017-10-17 NOTE — Patient Instructions (Addendum)
Hives and swelling    - at this time etiology of hives and swelling is due to autoimmune type.      - to help manage hives recommend taking your Cetirizine 10mg  - take 1 tab twice a day; Famotidine 20mg  - take 1 tab twice a day.   Will refill these today   - let us know if hives leave any marks/bruising, you have any fevers or any joint aches/pains  Follow-up 3 months or sooner if needed

## 2017-11-02 ENCOUNTER — Telehealth: Payer: Self-pay | Admitting: Allergy

## 2017-11-02 NOTE — Telephone Encounter (Signed)
Left detailed message advising patient that he would need to contact his insurance plan to request he not be changed to mail order. I also told him to let us know if there was anything further we could do.

## 2017-11-02 NOTE — Telephone Encounter (Signed)
patint recevied a letter stating that meds needs to bent through to [place] before meds could be shipped to him [facility] but [facility] time [facility] they werent sent there and now he has received this letter Please call  Patient does not like mail order - [facility]

## 2017-11-07 ENCOUNTER — Encounter: Payer: Self-pay | Admitting: Internal Medicine

## 2017-11-07 ENCOUNTER — Ambulatory Visit: Payer: Medicare Other | Admitting: Internal Medicine

## 2017-11-07 ENCOUNTER — Other Ambulatory Visit: Payer: Self-pay

## 2017-11-07 DIAGNOSIS — Z794 Long term (current) use of insulin: Secondary | ICD-10-CM

## 2017-11-07 DIAGNOSIS — L508 Other urticaria: Secondary | ICD-10-CM

## 2017-11-07 DIAGNOSIS — E119 Type 2 diabetes mellitus without complications: Secondary | ICD-10-CM

## 2017-11-07 MED ORDER — FAMOTIDINE 20 MG PO TABS: 20.0000 mg | ORAL_TABLET | Freq: Two times a day (BID) | ORAL | 1 refills | Status: DC

## 2017-11-07 MED ORDER — CETIRIZINE HCL 10 MG PO TABS: 10.0000 mg | ORAL_TABLET | Freq: Every day | ORAL | 1 refills | Status: DC

## 2017-11-07 NOTE — Patient Instructions (Signed)
I want you to follow up in 2-3 months. For your hives continue taking your medications. You need to make an appointment to get your eyes examined.

## 2017-11-07 NOTE — Progress Notes (Signed)
   [name] Kerrin Mo, MD Phone: [phone]  Reason For Visit: Follow up   # Chronic Urticaria  Patient recently allergies for flare up of hives. Patient is not sure if he had zyrtec and famotidine when he had the hives flare up. His blood work was largely normal other than a mild elevated calcium. Patient is now doing well, no further issue with hives on follow up. No issues with breathing, no lip swelling, no fever   # DM2  Doing well on Metfromin  Denies polyuria, visual changes, numbness or tingling. Discussed patient following up for eye exam again   Past Medical History Reviewed problem list.  Medications- reviewed and updated No additions to family history Social history- patient is a non- smoker  Objective: BP 132/72   Pulse 90   Temp 97.8 F (36.6 C) (Oral)   Ht 5\' 11"  (1.803 m)   Wt 170 lb 6.4 oz (77.3 kg)   SpO2 97%   BMI 23.77 kg/m  Gen: NAD, alert, cooperative with exam Cardio: regular rate and rhythm, S1S2 heard, no murmurs appreciated Pulm: clear to auscultation bilaterally, no wheezes, rhonchi or rales Extremities: warm, well perfused, No edema, cyanosis or clubbing;  Skin: dry, intact, no rashes or lesions   Diabetic Foot Exam - Simple   Simple Foot Form Diabetic Foot exam was performed with the following findings:  Yes 11/07/2017  9:23 AM  Visual Inspection No deformities, no ulcerations, no other skin breakdown bilaterally:  Yes Sensation Testing Intact to touch and monofilament testing bilaterally:  Yes Pulse Check Posterior Tibialis and Dorsalis pulse intact bilaterally:  Yes Comments     Assessment/Plan: See problem based a/p  Chronic urticaria Urticaria has improved since seeing the allergist -CMET notable for slightly elevated calcium-Will obtain ionized calcium -Continue Zyrtec and famotidine -Follow-up with allergist as needed  Diabetes mellitus Foot exam and pneumonia shot given today Patient plans  to follow-up for eye exam soon

## 2017-11-07 NOTE — Assessment & Plan Note (Signed)
Urticaria has improved since seeing the allergist -CMET notable for slightly elevated calcium-Will obtain ionized calcium -Continue Zyrtec and famotidine -Follow-up with allergist as needed

## 2017-11-07 NOTE — Assessment & Plan Note (Signed)
Foot exam and pneumonia shot given today Patient plans to follow-up for eye exam soon

## 2017-11-08 LAB — CALCIUM, IONIZED: Calcium, Ion: 5.7 mg/dL — ABNORMAL HIGH (ref 4.5–5.6)

## 2017-11-09 LAB — HM DIABETES EYE EXAM

## 2017-11-23 ENCOUNTER — Encounter: Payer: Self-pay | Admitting: Internal Medicine

## 2017-12-19 ENCOUNTER — Telehealth: Payer: Self-pay | Admitting: Allergy

## 2017-12-19 DIAGNOSIS — L508 Other urticaria: Secondary | ICD-10-CM

## 2017-12-19 MED ORDER — FAMOTIDINE 20 MG PO TABS: 20.0000 mg | ORAL_TABLET | Freq: Two times a day (BID) | ORAL | 1 refills | Status: DC

## 2017-12-19 MED ORDER — CETIRIZINE HCL 10 MG PO TABS: 10.0000 mg | ORAL_TABLET | Freq: Every day | ORAL | 1 refills | Status: DC

## 2017-12-19 NOTE — Telephone Encounter (Signed)
Patient advised of cetirizine being sent. I advised that we do not prescribe the HCTZ and he would need to contact the family doctor for that one. He did ask that we refill the Pepcid. I have sent that in as well.

## 2017-12-19 NOTE — Telephone Encounter (Signed)
Patient walked in the office Made 3 mo appt for aug 2019 Patient needs refill on HCTZ and [facility] [address] Please call patient with any questions

## 2018-01-19 ENCOUNTER — Other Ambulatory Visit: Payer: Self-pay

## 2018-01-19 ENCOUNTER — Ambulatory Visit: Payer: Medicare Other | Admitting: Allergy

## 2018-01-19 ENCOUNTER — Encounter: Payer: Self-pay | Admitting: Allergy

## 2018-01-19 VITALS — BP 124/76 | HR 100 | Resp 18 | Ht 74.0 in | Wt 167.0 lb

## 2018-01-19 DIAGNOSIS — L508 Other urticaria: Secondary | ICD-10-CM

## 2018-01-19 DIAGNOSIS — T783XXD Angioneurotic edema, subsequent encounter: Secondary | ICD-10-CM

## 2018-01-19 MED ORDER — CETIRIZINE HCL 10 MG PO TABS: 10.0000 mg | ORAL_TABLET | Freq: Every day | ORAL | 1 refills | Status: DC

## 2018-01-19 MED ORDER — FAMOTIDINE 20 MG PO TABS: 20.0000 mg | ORAL_TABLET | Freq: Two times a day (BID) | ORAL | 1 refills | Status: DC

## 2018-01-19 NOTE — Progress Notes (Signed)
Follow-up Note  RE: [name] MRN: [id number] DOB: Aug 09, 1940 Date of Office Visit: 01/19/2018   History of present illness: [name] is a 77 y.o. male presenting today for follow-up of chronic urticaria and angioedema.  He was last seen in the office on 10/17/17 by myself.  He denies any major health changes, surgeries or hospitalizations since last visit.  He states the hives and swelling continue to improve and he is having less episodes.  He does state that he continues to take zyrtec and pepcid  Both 2 times a day.  He needs these refilled today.      Review of systems: Review of Systems  Constitutional: Negative for chills, fever and malaise/fatigue.  HENT: Negative for congestion, ear discharge, ear pain, nosebleeds and sore throat.   Eyes: Negative for pain, discharge and redness.  Respiratory: Negative for cough, shortness of breath and wheezing.   Cardiovascular: Negative for chest pain.  Gastrointestinal: Negative for abdominal pain, constipation, diarrhea, nausea and vomiting.  Musculoskeletal: Negative for joint pain.  Skin: Positive for itching and rash.  Neurological: Negative for headaches.    All other systems negative unless noted above in HPI  Past medical/social/surgical/family history have been reviewed and are unchanged unless specifically indicated below.  No changes  Medication List: Allergies as of 01/19/2018      Reactions   Ace Inhibitors Swelling   Valsartan Swelling      Medication List        Accurate as of 01/19/18 11:14 AM. Always use your most recent med list.          cetirizine 10 MG tablet Commonly known as:  ZYRTEC Take 1 tablet (10 mg total) by mouth daily.   doxazosin 8 MG 24 hr tablet Commonly known as:  CARDURA XL Take 8 mg by mouth daily with breakfast.   famotidine 20 MG tablet Commonly known as:  PEPCID Take 1 tablet (20 mg total) by mouth 2 (two) times daily.   finasteride 5 MG tablet Commonly known as:   PROSCAR Take 5 mg by mouth daily.   hydrochlorothiazide 25 MG tablet Commonly known as:  HYDRODIURIL Take 1 tablet (25 mg total) by mouth daily.   metFORMIN 500 MG tablet Commonly known as:  GLUCOPHAGE Take 2 tablets (1,000 mg total) by mouth 2 (two) times daily with a meal.   ONE TOUCH ULTRA TEST test strip Generic drug:  glucose blood   pravastatin 80 MG tablet Commonly known as:  PRAVACHOL Take 1 tablet (80 mg total) by mouth daily.   vitamin B-12 1000 MCG tablet Commonly known as:  CYANOCOBALAMIN Take 1 tablet (1,000 mcg total) by mouth daily.       Known medication allergies: Allergies  Allergen Reactions  . Ace Inhibitors Swelling  . Valsartan Swelling     Physical examination: Blood pressure 124/76, pulse 100, resp. rate 18, height 6\' 2"  (1.88 m), weight 167 lb (75.8 kg), SpO2 97 %.  General: Alert, interactive, in no acute distress. HEENT: PERRLA, TMs pearly gray, turbinates minimally edematous without discharge, post-pharynx non erythematous. Neck: Supple without lymphadenopathy. Lungs: Clear to auscultation without wheezing, rhonchi or rales. {no increased work of breathing. CV: Normal S1, S2 without murmurs. Abdomen: Nondistended, nontender. Skin: Warm and dry, without lesions or rashes. Extremities:  No clubbing, cyanosis or edema. Neuro:   Grossly intact.  Diagnositics/Labs: None today  Assessment and plan: Chronic urticaria and angioedema   - etiology of hives and swelling is due to autoimmune  type (internal trigger).      - hives and swelling episodes have improved and are decreasing in frequency   - Continue taking your Cetirizine 10mg  - take 1 tab twice a day   - Continue taking your  Famotidine 20mg  - take 1 tab twice a day  Follow-up 6 months or sooner if needed  I appreciate the opportunity to take part in [facility] care. Please do not hesitate to contact me with questions.  Sincerely,   Prudy Feeler,  MD Allergy/Immunology [facility] [place] [facility] [place] [facility]

## 2018-01-19 NOTE — Patient Instructions (Addendum)
Hives and swelling    - at this time etiology of hives and swelling is due to autoimmune type (internal trigger).      - hives and swelling episodes have improved and are decreasing in frequency   - Continue taking your Cetirizine 10mg  - take 1 tab twice a day   - Continue taking your  Famotidine 20mg  - take 1 tab twice a day  Follow-up 6 months or sooner if needed

## 2018-01-23 MED ORDER — METFORMIN HCL 500 MG PO TABS: 1000.0000 mg | ORAL_TABLET | Freq: Two times a day (BID) | ORAL | 0 refills | Status: DC

## 2018-04-01 ENCOUNTER — Other Ambulatory Visit: Payer: Self-pay | Admitting: Student in an Organized Health Care Education/Training Program

## 2018-05-24 ENCOUNTER — Other Ambulatory Visit: Payer: Self-pay | Admitting: Student in an Organized Health Care Education/Training Program

## 2018-05-24 DIAGNOSIS — Z794 Long term (current) use of insulin: Principal | ICD-10-CM

## 2018-05-24 DIAGNOSIS — E1165 Type 2 diabetes mellitus with hyperglycemia: Secondary | ICD-10-CM

## 2018-05-24 MED ORDER — METFORMIN HCL 1000 MG PO TABS: 1000.0000 mg | ORAL_TABLET | Freq: Two times a day (BID) | ORAL | 1 refills | Status: DC

## 2018-06-22 ENCOUNTER — Ambulatory Visit: Payer: Medicare Other | Admitting: Student in an Organized Health Care Education/Training Program

## 2018-06-22 ENCOUNTER — Other Ambulatory Visit: Payer: Self-pay

## 2018-06-22 ENCOUNTER — Encounter: Payer: Self-pay | Admitting: Student in an Organized Health Care Education/Training Program

## 2018-06-22 VITALS — BP 104/68 | HR 96 | Temp 97.9°F | Ht 74.0 in | Wt 160.0 lb

## 2018-06-22 DIAGNOSIS — G4709 Other insomnia: Secondary | ICD-10-CM

## 2018-06-22 DIAGNOSIS — E119 Type 2 diabetes mellitus without complications: Secondary | ICD-10-CM

## 2018-06-22 DIAGNOSIS — E7849 Other hyperlipidemia: Secondary | ICD-10-CM

## 2018-06-22 DIAGNOSIS — Z794 Long term (current) use of insulin: Principal | ICD-10-CM

## 2018-06-22 DIAGNOSIS — I10 Essential (primary) hypertension: Secondary | ICD-10-CM

## 2018-06-22 DIAGNOSIS — E1165 Type 2 diabetes mellitus with hyperglycemia: Secondary | ICD-10-CM

## 2018-06-22 LAB — POCT UA - MICROALBUMIN
Albumin/Creatinine Ratio, Urine, POC: <30
Creatinine, POC: 100 mg/dL
Microalbumin Ur, POC: 30 mg/L

## 2018-06-22 LAB — POCT GLYCOSYLATED HEMOGLOBIN (HGB A1C): HbA1c, POC (controlled diabetic range): 6.4 % (ref 0.0–7.0)

## 2018-06-22 NOTE — Assessment & Plan Note (Addendum)
Patient states he has a lot going on which may be interfering with sleep but does not wish to discuss Recommended trying to adhere to sleep schedule but patient declined Asked to follow up in 1 month and revisit the issue Could try melatonin if patient open to it at next visit

## 2018-06-22 NOTE — Assessment & Plan Note (Addendum)
BP 104/68 today Considered discontinuing patient's HCTZ but saw that BP not consistently this low. Denies lightheadedness/dizziness when standing Will continue to monitor and if BP still low-normal will discontinue Refilled medication today Follow up in 1 month

## 2018-06-22 NOTE — Progress Notes (Signed)
Subjective:    Patient ID: [name], male    DOB: [birth year], 78 y.o.   MRN: [id number]   CC: general check up/diabetes  HPI:  Diabetes: patient states that he has been taking 1,000mg  metformin am and pm. Denies any nausea/vomiting/diarrhea. He denies increased urination, thirst, or appetite. Denies weight changes. Denies changes in vision. Denies nocturia.  Reviewed medications: patient brought in pill bottles and a pill case to verify his medications. He has one pill in pill case he is not sure what they are for but he has been taking them every day and needs a refill. I confirmed with pharmacy residents that the unidentified pill is HCTZ.   Sleep disturbances: patient states that he has difficulty sleeping. He does not sleep through the night. He goes to bed whenever he feels tired at different times throughout day and night since that is the schedule he is used to from work. He sleeps about 4 hours at a time. He states that he has a lot of things going on in his life so he thinks about these things as he's falling asleep. He does not want to talk about these things. He does not want to try a medication. He does not want to try a recommended sleep schedule. He is agreeable to meeting again to discuss his sleep difficulties further.   [facility]  Smoking status reviewed   ROS: pertinent noted in the HPI   Past Medical History:  Diagnosis Date  . Allergic rhinitis   . Anemia   . Angio-edema   . B12 deficiency   . BPH (benign prostatic hyperplasia)    [place]  . Diabetes ([place])   . Gout   . Hyperlipidemia   . Hypertension   . Mallory-Weiss tear   . Thyroid disorder   . 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 Urticaria     Past Surgical History:  Procedure Laterality Date  . ADENOIDECTOMY    . CHOLECYSTECTOMY  1960  . COLON SURGERY     right hemi-colectomy  . COLONOSCOPY  multiple  . ESOPHAGOGASTRODUODENOSCOPY    . TONSILLECTOMY       Past medical history, surgical, family, and social history reviewed and updated in the EMR as appropriate.  Objective:  BP 104/68   Pulse 96   Temp 97.9 F (36.6 C) (Oral)   Ht 6\' 2"  (1.88 m)   Wt 160 lb (72.6 kg)   SpO2 98%   BMI 20.54 kg/m   Vitals and nursing note reviewed  General: NAD, pleasant, able to participate in exam Cardiac: RRR, S1 S2 present. distant heart sounds, no murmurs. Good capillary refill. Respiratory: CTAB, normal effort, No wheezes, rales or rhonchi Extremities: no edema or cyanosis. Skin: warm and dry, no rashes noted Neuro: alert, no obvious focal deficits Psych: Normal affect and mood   Assessment & Plan:    Diabetes mellitus Well controlled. A1c at this visit 6.4- improved from 8.0 at last check Continue metformin 1000mg  BID and consider decreasing dose if A1c is consistently within goal at next check Patient had previous questions about how he was supposed to take his medications but has been taking as prescribed since our phone call and tolerating well. Ordered urine  Microalbumin/creatine test this appointment Discussed vision and recommended patient follow up with eye exam Patient has decreased weight since last visit at 160 lbs so will continue to monitor at next visit Patient is up  to date on seasonal flu shot Diabetic foot exam at next appointment  Essential hypertension BP 104/68 today Considered discontinuing patient's HCTZ but saw that BP not consistently this low. Denies lightheadedness/dizziness when standing Will continue to monitor and if BP still low-normal will discontinue Refilled medication today Follow up in 1 month  Insomnia Patient states he has a lot going on which may be interfering with sleep but does not wish to discuss Recommended trying to adhere to sleep schedule but patient declined Asked to follow up in 1 month and revisit the issue Could try melatonin if patient open to it at next  visit  Hyperlipidemia Currently taking pravastatin 80mg  Tolerating well Checking lipid panel today   Doristine Mango, [place] PGY-1

## 2018-06-22 NOTE — Assessment & Plan Note (Signed)
Currently taking pravastatin 80mg  Tolerating well Checking lipid panel today

## 2018-06-22 NOTE — Assessment & Plan Note (Addendum)
Well controlled. A1c at this visit 6.4- improved from 8.0 at last check Continue metformin 1000mg  BID and consider decreasing dose if A1c is consistently within goal at next check Patient had previous questions about how he was supposed to take his medications but has been taking as prescribed since our phone call and tolerating well. Ordered urine  Microalbumin/creatine test this appointment Discussed vision and recommended patient follow up with eye exam Patient has decreased weight since last visit at 160 lbs so will continue to monitor at next visit Patient is up to date on seasonal flu shot Diabetic foot exam at next appointment

## 2018-06-22 NOTE — Patient Instructions (Signed)
It was a pleasure to see you today!  To summarize our discussion for this visit:  Your diabetes medication is correct, A1c 6.4  I will refill your BP medication  [facility]  We are checking your kidney function and cholesterol  We will discuss your sleep problems further at next visit  Some additional health maintenance measures we should update are: Health Maintenance Due  Topic Date Due  . INFLUENZA VACCINE  01/05/2018  . HEMOGLOBIN A1C  04/09/2018  . eye exam   Please return to our clinic to see me in 1 month for sleep discussion.  Call the clinic at [phone] if your symptoms worsen or you have any concerns.  Thank you for allowing me to take part in your care,  Dr. Doristine Mango   Thanks for choosing [facility] for your primary care.

## 2018-06-23 LAB — LIPID PANEL
Chol/HDL Ratio: 4.7 ratio (ref 0.0–5.0)
Cholesterol, Total: 140 mg/dL (ref 100–199)
HDL: 30 mg/dL — ABNORMAL LOW (ref >39)
LDL Calculated: 94 mg/dL (ref 0–99)
Triglycerides: 81 mg/dL (ref 0–149)
VLDL Cholesterol Cal: 16 mg/dL (ref 5–40)

## 2018-06-29 ENCOUNTER — Encounter: Payer: Self-pay | Admitting: Student in an Organized Health Care Education/Training Program

## 2018-07-01 ENCOUNTER — Other Ambulatory Visit: Payer: Self-pay | Admitting: Student in an Organized Health Care Education/Training Program

## 2018-08-08 NOTE — Progress Notes (Signed)
Subjective:   Patient ID: [name]    DOB: 1941/04/06, 78 y.o. male   MRN: [id number]  [name] is a 78 y.o. male here for follow up on HTN and insomnia.  #Essential hypertension BP 140/80 today. Notes he is taking the HCTZ although he does not recall all the medications he is taking. He also takes Doxazosin for BPH. Notes he took his blood pressure medicines this morning. Denies any lightheadedness or dizziness, headaches, vision changes, chest pain, SOB.  #Diabetes: Would like to obtain DM glucometer and test strips.  #Insomnia: Patient seen for insomnia at last visit. He notes he is able to fall asleep, but not able to stay a sleep. Notes "nods off throughout the day", sleeps with TV on, and sometimes "has things on my mind that keeps my mind up." Denies any current stressors. Patient is retired and does not work - stays at home most of the day. Patient denies snoring. Denies nocturia. Notes that he thinks he may try to go to sleep too early and that may be the cause for him being unale to stay asleep. Has not tried anything to help with sleep. Denies alcohol or drugs. This is not a big concern for patient at this time.   Review of Systems:  Per HPI.   [place], medications and smoking status reviewed.  Objective:   BP 140/80 (BP Location: Left Arm, Patient Position: Sitting, Cuff Size: Normal)   Pulse 99   Temp 98 F (36.7 C) (Oral)   Ht 6' 2" (1.88 m)   Wt 73.7 kg   SpO2 99%   BMI 20.85 kg/m  Vitals and nursing note reviewed.  General: well nourished, well developed, in no acute distress with non-toxic appearance Neck: supple, normal ROM CV: regular rate and rhythm without murmurs, rubs, or gallops, no lower extremity edema Lungs: clear to auscultation bilaterally with normal work of breathing Abdomen: soft, non-tender, non-distended, normoactive bowel sounds Skin: warm, dry, no rashes or lesions Extremities: warm and well perfused, normal tone MSK: ROM  grossly intact, strength intact, gait normal with cane Neuro: Alert and oriented, speech normal  Assessment & Plan:   Essential hypertension Blood pressure more elevated today compared to last visit, although in good range for age (goal <150 SBP) Denies any symptoms of hypo/hypertension. Recommend continuing HCTZ 47m QD at this time Follow up in 3 months  Diabetes mellitus Hgb A1C 6.4 on 06/22/18 Glucometer and test strips ordered RTC in 3 months for A1C, consider less frequent visits if A1C at goal at next visit  Insomnia Insomnia not a concern for patient at this time. Patient felt he knew of why he had difficulties staying a sleep. Patient opted to defer further evaluation at this time and if becomes more problematic agreed to discuss further at that time. Recommended OTC Melatonin if he desires mild sleep aid. Patient understood and agreed to plan.  Meds ordered this encounter  Medications  . DISCONTD: glucose blood (ONE TOUCH ULTRA TEST) test strip    Sig: Please check you blood sugar every day    Dispense:  100 each    Refill:  11  . Blood Glucose Monitoring Suppl (ONETOUCH VERIO) w/Device KIT    Sig: 1 Device by Does not apply route as needed.    Dispense:  1 kit    Refill:  0  . glucose blood (ONETOUCH VERIO) test strip    Sig: Use as instructed    Dispense:  100 each  Refill:  [place], DO PGY-1, [facility] 08/09/2018 11:37 AM

## 2018-08-09 ENCOUNTER — Telehealth: Payer: Self-pay | Admitting: *Deleted

## 2018-08-09 ENCOUNTER — Ambulatory Visit: Payer: Medicare Other | Admitting: Family Medicine

## 2018-08-09 ENCOUNTER — Encounter: Payer: Self-pay | Admitting: Family Medicine

## 2018-08-09 VITALS — BP 140/80 | HR 99 | Temp 98.0°F | Ht 74.0 in | Wt 162.4 lb

## 2018-08-09 DIAGNOSIS — Z794 Long term (current) use of insulin: Secondary | ICD-10-CM | POA: Diagnosis not present

## 2018-08-09 DIAGNOSIS — E119 Type 2 diabetes mellitus without complications: Secondary | ICD-10-CM | POA: Diagnosis not present

## 2018-08-09 DIAGNOSIS — G4709 Other insomnia: Secondary | ICD-10-CM | POA: Diagnosis not present

## 2018-08-09 DIAGNOSIS — I10 Essential (primary) hypertension: Secondary | ICD-10-CM

## 2018-08-09 MED ORDER — ONETOUCH VERIO W/DEVICE KIT: 1.0000 | PACK | 0 refills | Status: DC | PRN

## 2018-08-09 MED ORDER — ONETOUCH DELICA LANCETS 33G MISC: 12 refills | Status: DC

## 2018-08-09 MED ORDER — ONETOUCH DELICA LANCING DEV MISC: 0 refills | Status: DC

## 2018-08-09 MED ORDER — GLUCOSE BLOOD VI STRP: ORAL_STRIP | 12 refills | Status: DC

## 2018-08-09 MED ORDER — GLUCOSE BLOOD VI STRP: ORAL_STRIP | 11 refills | Status: DC

## 2018-08-09 NOTE — Patient Instructions (Signed)
Thank you for coming to see me today. It was a pleasure. Today we talked about:   Blood Pressure: Please continue to take your Hydrochlorothiazide as prescribed.   Diabetes: I have called in a glucometer and test strips to your pharmacy.   Insomnia: We discussed your problems with sleep. I recommended over the counter Melatonin 5-10mg  as needed if you so desire.   Please follow-up with your PCP in 3 months for diabetes and blood pressure follow up.  If you have any questions or concerns, please do not hesitate to call the office at [phone].  Take Care,   Dr. Mina Marble, DO Resident Physician [place] [phone]

## 2018-08-09 NOTE — Assessment & Plan Note (Addendum)
Insomnia not a concern for patient at this time. Patient felt he knew of why he had difficulties staying a sleep. Patient opted to defer further evaluation at this time and if becomes more problematic agreed to discuss further at that time. Recommended OTC Melatonin if he desires mild sleep aid. Patient understood and agreed to plan.

## 2018-08-09 NOTE — Assessment & Plan Note (Signed)
Hgb A1C 6.4 on 06/22/18 Glucometer and test strips ordered RTC in 3 months for A1C, consider less frequent visits if A1C at goal at next visit

## 2018-08-09 NOTE — Telephone Encounter (Signed)
Received call from [facility].  They received the meter and strips but not the lancing device or lancets.  Sent in per verbal orders from Dr. Ardelia Mems. [place], Salome Spotted, CMA

## 2018-08-09 NOTE — Assessment & Plan Note (Signed)
Blood pressure more elevated today compared to last visit, although in good range for age (goal <150 SBP) Denies any symptoms of hypo/hypertension. Recommend continuing HCTZ 25mg  QD at this time Follow up in 3 months

## 2018-08-10 ENCOUNTER — Other Ambulatory Visit: Payer: Self-pay | Admitting: Student in an Organized Health Care Education/Training Program

## 2018-08-28 ENCOUNTER — Other Ambulatory Visit: Payer: Self-pay | Admitting: Student in an Organized Health Care Education/Training Program

## 2018-08-28 DIAGNOSIS — E1165 Type 2 diabetes mellitus with hyperglycemia: Secondary | ICD-10-CM

## 2018-08-28 DIAGNOSIS — Z794 Long term (current) use of insulin: Principal | ICD-10-CM

## 2018-08-28 NOTE — Telephone Encounter (Signed)
Patient is requesting refill on his Metformin.  Please call to discuss as he said he is having trouble getting it from pharmacy.

## 2018-10-18 ENCOUNTER — Telehealth: Payer: Self-pay

## 2018-10-18 NOTE — Telephone Encounter (Signed)
LVM for patient to call office and schedule an office visit for July 2020 per Dr. Ouida Sills.  [name], [name]

## 2018-11-03 ENCOUNTER — Encounter: Payer: Self-pay | Admitting: Family Medicine

## 2018-11-03 ENCOUNTER — Ambulatory Visit: Payer: Medicare Other | Admitting: Student in an Organized Health Care Education/Training Program

## 2018-11-03 ENCOUNTER — Other Ambulatory Visit: Payer: Self-pay

## 2018-11-03 ENCOUNTER — Ambulatory Visit (INDEPENDENT_AMBULATORY_CARE_PROVIDER_SITE_OTHER): Payer: Medicare Other | Admitting: Family Medicine

## 2018-11-03 VITALS — BP 130/64 | HR 67

## 2018-11-03 DIAGNOSIS — G4709 Other insomnia: Secondary | ICD-10-CM | POA: Diagnosis not present

## 2018-11-03 DIAGNOSIS — Z794 Long term (current) use of insulin: Secondary | ICD-10-CM | POA: Diagnosis not present

## 2018-11-03 DIAGNOSIS — E1165 Type 2 diabetes mellitus with hyperglycemia: Secondary | ICD-10-CM

## 2018-11-03 DIAGNOSIS — R5383 Other fatigue: Secondary | ICD-10-CM | POA: Diagnosis not present

## 2018-11-03 LAB — POCT GLYCOSYLATED HEMOGLOBIN (HGB A1C): HbA1c, POC (controlled diabetic range): 6.3 % (ref 0.0–7.0)

## 2018-11-03 NOTE — Assessment & Plan Note (Addendum)
Patient reports difficulties staying asleep and fatigue. Suspect poor sleep hygiene playing a role as patient has TV playing all during the night and naps frequently during the day. Counseling and tips provided, see AVS. Also has h/o anemia, will check CBC. Goiter on exam although does not report symptoms of obstruction, will check TSH. Does not take diurectic at night. Denies depressive symptoms, could consider full PHQ9 at next visit. Recommended trial of melatonin. F/u in one month.

## 2018-11-03 NOTE — Progress Notes (Signed)
  Subjective:   Patient ID: [name]    DOB: [birth year], 78 y.o. male   MRN: [id number]  [name] is a 78 y.o. male with a history of HTN, diabetes, chronic urticaria, IDA, memory loss, HLD, B12 deficiency here for   Fatigue, insomnia - has trouble staying asleep. Has trouble going back to sleep when he wakes. Maybe sleeps about 4 hours per night.  - don't have much energy, sluggish during the day - will lay down around 8 or 9pm. Will sleep until 45mn, then will wake up and go back to sleep after a while - TV plays all night long, in bedroom - gets sleepy during the day, takes naps during the day. - does not wake up gasping for air - lives alone, does not thinks he snores. - has not tried anything for sleep - doesn't take diuretic at night. - no chest pain, palpitations, leg swelling, headaches, vision changes. - no limitations with ADLs. - denies depression, home concerns  Review of Systems:  Per HPI.  [place], medications and smoking status reviewed.  Objective:   BP 130/64   Pulse 67   SpO2 96%  Vitals and nursing note reviewed.  General: well nourished, well developed, in no acute distress with non-toxic appearance HEENT: normocephalic, atraumatic, moist mucous membranes Neck: supple, non-tender without lymphadenopathy. Marked thyromegaly. CV: regular rate and rhythm without murmurs, rubs, or gallops, no lower extremity edema Lungs: clear to auscultation bilaterally with normal work of breathing Abdomen: soft, non-tender, non-distended, normoactive bowel sounds Skin: warm, dry, no rashes or lesions Extremities: warm and well perfused, normal tone MSK: ROM grossly intact, strength intact, gait normal Neuro: Alert and oriented, speech normal  Assessment & Plan:   Insomnia Patient reports difficulties staying asleep and fatigue. Suspect poor sleep hygiene playing a role as patient has TV playing all during the night and naps frequently during the day. Counseling  and tips provided, see AVS. Also has h/o anemia, will check CBC. Goiter on exam although does not report symptoms of obstruction, will check TSH. Does not take diurectic at night. Denies depressive symptoms, could consider full PHQ9 at next visit. Recommended trial of melatonin. F/u in one month.  Orders Placed This Encounter  Procedures  . TSH  . CBC  . HgB A1c   No orders of the defined types were placed in this encounter.   Rory Percy, DO PGY-2, [facility] 11/03/2018 3:41 PM

## 2018-11-03 NOTE — Patient Instructions (Signed)
It was great to see you!  Our plans for today:  - We are checking some labs today, we will call you or send you a letter if they are abnormal.  - Try the following to help you sleep better:  - limit naps during the day  - no screens (TV, phone, tablet, computer) at least 1-2 hours before bedtime.  - have a quiet and dark sleeping environment.  - no large meals or drinks about 1 hour before bed.  - Avoid taking diuretics (hydrochlorothiazide, furosemide) in the evenings.  - Exercise or move your body regularly every day.  - You can also try melatonin 10 mg over the counter. Take this 1-2 hours before bed. You can increase to 20mg  if this is not helpful.  Take care and seek immediate care sooner if you develop any concerns.   Dr. [facility]

## 2018-11-04 LAB — CBC
Hematocrit: 37.9 % (ref 37.5–51.0)
Hemoglobin: 12.1 g/dL — ABNORMAL LOW (ref 13.0–17.7)
MCH: 28 pg (ref 26.6–33.0)
MCHC: 31.9 g/dL (ref 31.5–35.7)
MCV: 88 fL (ref 79–97)
Platelets: 214 x10E3/uL (ref 150–450)
RBC: 4.32 x10E6/uL (ref 4.14–5.80)
RDW: 12.4 % (ref 11.6–15.4)
WBC: 5.5 x10E3/uL (ref 3.4–10.8)

## 2018-11-04 LAB — TSH: TSH: 1.04 u[IU]/mL (ref 0.450–4.500)

## 2018-11-07 ENCOUNTER — Other Ambulatory Visit: Payer: Self-pay

## 2018-11-07 DIAGNOSIS — I10 Essential (primary) hypertension: Secondary | ICD-10-CM

## 2018-11-07 DIAGNOSIS — E785 Hyperlipidemia, unspecified: Secondary | ICD-10-CM

## 2018-11-09 MED ORDER — PRAVASTATIN SODIUM 80 MG PO TABS: 80.0000 mg | ORAL_TABLET | Freq: Every day | ORAL | 2 refills | Status: DC

## 2018-11-09 MED ORDER — HYDROCHLOROTHIAZIDE 25 MG PO TABS: 25.0000 mg | ORAL_TABLET | Freq: Every day | ORAL | 3 refills | Status: DC

## 2018-11-14 ENCOUNTER — Other Ambulatory Visit: Payer: Self-pay

## 2018-11-14 DIAGNOSIS — L508 Other urticaria: Secondary | ICD-10-CM

## 2018-11-15 MED ORDER — CETIRIZINE HCL 10 MG PO TABS: 10.0000 mg | ORAL_TABLET | Freq: Every day | ORAL | 1 refills | Status: DC

## 2018-11-21 ENCOUNTER — Other Ambulatory Visit: Payer: Self-pay | Admitting: Student in an Organized Health Care Education/Training Program

## 2018-12-05 ENCOUNTER — Ambulatory Visit (INDEPENDENT_AMBULATORY_CARE_PROVIDER_SITE_OTHER): Payer: Medicare Other

## 2018-12-05 ENCOUNTER — Other Ambulatory Visit: Payer: Self-pay

## 2018-12-05 VITALS — BP 105/65 | HR 90 | Temp 98.7°F | Ht 72.5 in | Wt 146.0 lb

## 2018-12-05 DIAGNOSIS — Z0000 Encounter for general adult medical examination without abnormal findings: Secondary | ICD-10-CM

## 2018-12-05 NOTE — Patient Instructions (Addendum)
You spoke to [name], [place] over the phone for your annual wellness visit.  We also discussed recommended health maintenance. Please call our office and schedule a visit. Please return in about 3 months for diabetes check and annual Flu Vaccine.   Health Maintenance  Topic Date Due  . FOOT EXAM  11/08/2018  . PNA vac Low Risk Adult (2 of 2 - PPSV23) 11/08/2018  . OPHTHALMOLOGY EXAM  11/10/2018  . INFLUENZA VACCINE  01/06/2019  . HEMOGLOBIN A1C  05/06/2019  . URINE MICROALBUMIN  06/23/2019  . TETANUS/TDAP  07/16/2027    We also discussed advanced directive. Please look over with your family and return packet to our office! Keep up the good work walking around the community to stay active! Take care!   Health Maintenance After Age 32 After age 39, you are at a higher risk for certain long-term diseases and infections as well as injuries from falls. Falls are a major cause of broken bones and head injuries in people who are older than age 69. Getting regular preventive care can help to keep you healthy and well. Preventive care includes getting regular testing and making lifestyle changes as recommended by your health care provider. Talk with your health care provider about:  Which screenings and tests you should have. A screening is a test that checks for a disease when you have no symptoms.  A diet and exercise plan that is right for you. What should I know about screenings and tests to prevent falls? Screening and testing are the best ways to find a health problem early. Early diagnosis and treatment give you the best chance of managing medical conditions that are common after age 93. Certain conditions and lifestyle choices may make you more likely to have a fall. Your health care provider may recommend:  Regular vision checks. Poor vision and conditions such as cataracts can make you more likely to have a fall. If you wear glasses, make sure to get your prescription updated if your  vision changes.  Medicine review. Work with your health care provider to regularly review all of the medicines you are taking, including over-the-counter medicines. Ask your health care provider about any side effects that may make you more likely to have a fall. Tell your health care provider if any medicines that you take make you feel dizzy or sleepy.  Osteoporosis screening. Osteoporosis is a condition that causes the bones to get weaker. This can make the bones weak and cause them to break more easily.  Blood pressure screening. Blood pressure changes and medicines to control blood pressure can make you feel dizzy.  Strength and balance checks. Your health care provider may recommend certain tests to check your strength and balance while standing, walking, or changing positions.  Foot health exam. Foot pain and numbness, as well as not wearing proper footwear, can make you more likely to have a fall.  Depression screening. You may be more likely to have a fall if you have a fear of falling, feel emotionally low, or feel unable to do activities that you used to do.  Alcohol use screening. Using too much alcohol can affect your balance and may make you more likely to have a fall. What actions can I take to lower my risk of falls? General instructions  Talk with your health care provider about your risks for falling. Tell your health care provider if: ? You fall. Be sure to tell your health care provider about all  falls, even ones that seem minor. ? You feel dizzy, sleepy, or off-balance.  Take over-the-counter and prescription medicines only as told by your health care provider. These include any supplements.  Eat a healthy diet and maintain a healthy weight. A healthy diet includes low-fat dairy products, low-fat (lean) meats, and fiber from whole grains, beans, and lots of fruits and vegetables. Home safety  Remove any tripping hazards, such as rugs, cords, and clutter.  Install  safety equipment such as grab bars in bathrooms and safety rails on stairs.  Keep rooms and walkways well-lit. Activity   Follow a regular exercise program to stay fit. This will help you maintain your balance. Ask your health care provider what types of exercise are appropriate for you.  If you need a cane or walker, use it as recommended by your health care provider.  Wear supportive shoes that have nonskid soles. Lifestyle  Do not drink alcohol if your health care provider tells you not to drink.  If you drink alcohol, limit how much you have: ? 0-1 drink a day for women. ? 0-2 drinks a day for men.  Be aware of how much alcohol is in your drink. In the [place] one drink equals one typical bottle of beer (12 oz), one-half glass of wine (5 oz), or one shot of hard liquor (1 oz).  Do not use any products that contain nicotine or tobacco, such as cigarettes and e-cigarettes. If you need help quitting, ask your health care provider. Summary  Having a healthy lifestyle and getting preventive care can help to protect your health and wellness after age 31.  Screening and testing are the best way to find a health problem early and help you avoid having a fall. Early diagnosis and treatment give you the best chance for managing medical conditions that are more common for people who are older than age 44.  Falls are a major cause of broken bones and head injuries in people who are older than age 56. Take precautions to prevent a fall at home.  Work with your health care provider to learn what changes you can make to improve your health and wellness and to prevent falls. This information is not intended to replace advice given to you by your health care provider. Make sure you discuss any questions you have with your health care provider. Document Released: 04/06/2017 Document Revised: 09/14/2018 Document Reviewed: 04/06/2017 Elsevier Patient Education  2020 [place] [facility]'s  number is [phone]. Please call with questions or concerns about what we discussed today.

## 2018-12-05 NOTE — Progress Notes (Signed)
Subjective:   [name] is a 78 y.o. male who presents for Medicare Annual initial preventive examination.  Review of Systems: Will defer to PCP  Cardiac Risk Factors include: advanced age (>76mn, >>45women);diabetes mellitus  Objective:    Vitals: BP 105/65   Pulse 90   Temp 98.7 F (37.1 C) (Oral)   Ht 6' 0.5" (1.842 m)   Wt 146 lb (66.2 kg)   SpO2 98%   BMI 19.53 kg/m   Body mass index is 19.53 kg/m.  Advanced Directives 12/05/2018 11/03/2018 11/07/2017 10/07/2017 05/12/2017 04/16/2017 04/13/2017  Does Patient Have a Medical Advance Directive? - No No No No No No  Would patient like information on creating a medical advance directive? Yes (MAU/Ambulatory/Procedural Areas - Information given) No - Patient declined No - Patient declined No - Patient declined No - Patient declined No - Patient declined No - Patient declined    Tobacco Social History   Tobacco Use  Smoking Status Former Smoker  . Packs/day: 1.00  . Years: 30.00  . Pack years: 30.00  . Types: Cigarettes  . Quit date: 06/07/2001  . Years since quitting: 17.5  Smokeless Tobacco Former USystems developer . Types: Chew  . Quit date: 06/07/2001      Clinical Intake:  Pre-visit preparation completed: Yes  Pain Score: 0-No pain   How often do you need to have someone help you when you read instructions, pamphlets, or other written materials from your doctor or pharmacy?: 2 - Rarely What is the last grade level you completed in school?: high school  Interpreter Needed?: No   Past Medical History:  Diagnosis Date  . Allergic rhinitis   . Anemia   . Angio-edema   . B12 deficiency   . BPH (benign prostatic hyperplasia)    [place]  . [place]   . Gout   . Hyperlipidemia   . Hypertension   . Mallory-Weiss tear   . Thyroid disorder   . 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 Urticaria    Past Surgical History:  Procedure Laterality Date  . ADENOIDECTOMY    . CHOLECYSTECTOMY  1960  . COLON SURGERY      right hemi-colectomy  . COLONOSCOPY  multiple  . ESOPHAGOGASTRODUODENOSCOPY    . TONSILLECTOMY     Family History  Problem Relation Age of Onset  . Diabetes Other        nephew  . Colon cancer Neg Hx    Social History   Socioeconomic History  . Marital status: Divorced    Spouse name: Not on file  . Number of children: 3  . Years of education: 162 . Highest education level: High school graduate  Occupational History  . Occupation: retired    Comment: TCustomer service manager . Financial resource strain: Not hard at all  . Food insecurity    Worry: Never true    Inability: Never true  . Transportation needs    Medical: No    Non-medical: No  Tobacco Use  . Smoking status: Former Smoker    Packs/day: 1.00    Years: 30.00    Pack years: 30.00    Types: Cigarettes    Quit date: 06/07/2001    Years since quitting: 17.5  . Smokeless tobacco: Former USystems developer   Types: [place] 06[place]07[place]2001  Substance and Sexual Activity  . Alcohol use: No    Alcohol/week: 0.0 standard drinks  Frequency: Never    Comment: quit about 10 years ago   . Drug use: No  . Sexual activity: Yes    Partners: Female  Lifestyle  . Physical activity    Days per week: 7 days    Minutes per session: 60 min  . Stress: Not at all  Relationships  . Social Herbalist on phone: Three times a week    Gets together: Three times a week    Attends religious service: More than 4 times per year    Active member of club or organization: No    Attends meetings of clubs or organizations: Never    Relationship status: Divorced  Other Topics Concern  . Not on file  Social History Narrative   Lives in [place].    Hobbies: hangs out socially    Patient enjoys walking around the community to stay active.    Outpatient Encounter Medications as of 12/05/2018  Medication Sig  . Blood Glucose Monitoring Suppl (ONETOUCH VERIO) w/Device KIT 1 Device by Does not apply route as needed.  .  cetirizine (ZYRTEC) 10 MG tablet Take 1 tablet (10 mg total) by mouth daily.  Marland Kitchen doxazosin (CARDURA XL) 8 MG 24 hr tablet Take 8 mg by mouth daily with breakfast.  . famotidine (PEPCID) 20 MG tablet Take 1 tablet (20 mg total) by mouth 2 (two) times daily.  . finasteride (PROSCAR) 5 MG tablet Take 5 mg by mouth daily.  Marland Kitchen glucose blood (ONETOUCH VERIO) test strip Use as instructed  . hydrochlorothiazide (HYDRODIURIL) 25 MG tablet Take 1 tablet (25 mg total) by mouth daily.  Elmore Guise Devices (ONE TOUCH DELICA LANCING DEV) MISC Use to test blood sugar once a day. Dx code: E11.9  . metFORMIN (GLUCOPHAGE) 500 MG tablet TAKE 2 TABLETS BY MOUTH TWICE A DAY WITH MEALS  . ONETOUCH DELICA LANCETS 09T MISC Use to test blood sugar once a day. Dx code: E11.9  . pravastatin (PRAVACHOL) 80 MG tablet Take 1 tablet (80 mg total) by mouth daily.  . vitamin B-12 (CYANOCOBALAMIN) 1000 MCG tablet Take 1 tablet (1,000 mcg total) by mouth daily.   No facility-administered encounter medications on file as of 12/05/2018.     Activities of Daily Living In your present state of health, do you have any difficulty performing the following activities: 12/05/2018  Hearing? Y  Vision? Y  Difficulty concentrating or making decisions? N  Walking or climbing stairs? N  Dressing or bathing? N  Doing errands, shopping? N  Preparing Food and eating ? N  Using the Toilet? N  In the past six months, have you accidently leaked urine? N  Do you have problems with loss of bowel control? N  Managing your Medications? N  Managing your Finances? N  Housekeeping or managing your Housekeeping? N  Some recent data might be hidden    Patient Care Team: Richarda Osmond, DO as PCP - General   Assessment:   This is a routine wellness examination for [facility].  Exercise Activities and Dietary recommendations Current Exercise Habits: The patient does not participate in regular exercise at present, Exercise limited by: orthopedic  condition(s) The patient enjoys walking around the community to stay active.    Fall Risk Fall Risk  12/05/2018 11/03/2018 08/09/2018 06/22/2018 11/07/2017  Falls in the past year? 0 0 0 0 No   Is the patient's home free of loose throw rugs in walkways, pet beds, electrical cords, etc?   yes  Grab bars in the bathroom? no      Handrails on the stairs?   yes      Adequate lighting?   yes   Patient rating of health (0-10): 7 "doing pretty good for my age"   Depression Screen PHQ 2/9 Scores 12/05/2018 11/03/2018 06/22/2018 11/07/2017  PHQ - 2 Score 0 0 0 0  PHQ- 9 Score - - - -    Cognitive Function     6CIT Screen 12/05/2018  What Year? 0 points  What month? 0 points  What time? 0 points  Count back from 20 0 points  Months in reverse 0 points  Repeat phrase 0 points  Total Score 0    Immunization History  Administered Date(s) Administered  . Influenza, High Dose Seasonal PF 03/23/2018  . Influenza,inj,Quad PF,6+ Mos 04/13/2016, 04/13/2017  . Pneumococcal Conjugate-13 11/07/2017  . Tdap 07/15/2017    Screening Tests Health Maintenance  Topic Date Due  . FOOT EXAM  11/08/2018  . PNA vac Low Risk Adult (2 of 2 - PPSV23) 11/08/2018  . OPHTHALMOLOGY EXAM  11/10/2018  . INFLUENZA VACCINE  01/06/2019  . HEMOGLOBIN A1C  05/06/2019  . URINE MICROALBUMIN  06/23/2019  . TETANUS/TDAP  07/16/2027   Cancer Screenings: Lung: Low Dose CT Chest recommended if Age 28-80 years, 30 pack-year currently smoking OR have quit w/in 15years. Patient does not qualify. Colorectal: Completed 2017    Plan:  Please read through the Advanced Directive I gave you with your children. Once filled out, please return back to our office. Keep up the good work walking around the community to stay active!   I have personally reviewed and noted the following in the patient's chart:   . Medical and social history . Use of alcohol, tobacco or illicit drugs  . Current medications and supplements .  Functional ability and status . Nutritional status . Physical activity . Advanced directives . List of other physicians . Hospitalizations, surgeries, and ER visits in previous 12 months . Vitals . Screenings to include cognitive, depression, and falls . Referrals and appointments  In addition, I have reviewed and discussed with patient certain preventive protocols, quality metrics, and best practice recommendations. A written personalized care plan for preventive services as well as general preventive health recommendations were provided to patient.   Dorna Bloom, [place]  12/05/2018

## 2019-01-15 ENCOUNTER — Telehealth: Payer: Self-pay

## 2019-01-15 DIAGNOSIS — L508 Other urticaria: Secondary | ICD-10-CM

## 2019-01-15 MED ORDER — CETIRIZINE HCL 10 MG PO TABS: 10.0000 mg | ORAL_TABLET | Freq: Every day | ORAL | 0 refills | Status: DC

## 2019-01-15 MED ORDER — FAMOTIDINE 20 MG PO TABS: 20.0000 mg | ORAL_TABLET | Freq: Two times a day (BID) | ORAL | 0 refills | Status: DC

## 2019-01-15 NOTE — Telephone Encounter (Signed)
Requesting Courtesy Refill on Cetrizine & Famotidine 20 MG.  [facility] [address].

## 2019-01-15 NOTE — Telephone Encounter (Signed)
Courtesy refill sent in  

## 2019-01-16 ENCOUNTER — Other Ambulatory Visit: Payer: Self-pay

## 2019-01-16 DIAGNOSIS — L508 Other urticaria: Secondary | ICD-10-CM

## 2019-01-19 MED ORDER — FAMOTIDINE 20 MG PO TABS: 20.0000 mg | ORAL_TABLET | Freq: Two times a day (BID) | ORAL | 0 refills | Status: DC

## 2019-01-22 ENCOUNTER — Other Ambulatory Visit: Payer: Self-pay | Admitting: Allergy

## 2019-01-22 DIAGNOSIS — L508 Other urticaria: Secondary | ICD-10-CM

## 2019-01-25 ENCOUNTER — Other Ambulatory Visit: Payer: Self-pay

## 2019-01-25 ENCOUNTER — Ambulatory Visit (INDEPENDENT_AMBULATORY_CARE_PROVIDER_SITE_OTHER): Payer: Medicare Other | Admitting: Allergy

## 2019-01-25 ENCOUNTER — Encounter: Payer: Self-pay | Admitting: Allergy

## 2019-01-25 DIAGNOSIS — L508 Other urticaria: Secondary | ICD-10-CM

## 2019-01-25 MED ORDER — FAMOTIDINE 20 MG PO TABS: 20.0000 mg | ORAL_TABLET | Freq: Two times a day (BID) | ORAL | 5 refills | Status: DC

## 2019-01-25 MED ORDER — CETIRIZINE HCL 10 MG PO TABS: 10.0000 mg | ORAL_TABLET | Freq: Two times a day (BID) | ORAL | 5 refills | Status: DC

## 2019-01-25 NOTE — Patient Instructions (Signed)
Hives and swelling    - at this time etiology of hives and swelling is due to autoimmune type (internal trigger).      - hives and swelling episodes have improved   - will try to wean down to Cetirizine 10mg  1 tablet in the morning AND Famotidine 20mg  1 tablets in the morning   - if hives and swelling return then go back to taking Cetirizine and Famotidine twice a day  Follow-up 12 months or sooner if needed

## 2019-01-25 NOTE — Progress Notes (Signed)
Follow-up Note  RE: [name] MRN: [id number] DOB: 1940/12/21 Date of Office Visit: 01/25/2019   History of present illness: [name] is a 78 y.o. male presenting today for follow-up of urticaria with angioedema.  He was last seen in the office on January 19, 2018 by myself.  He states he has done well over the past year without any major health changes, surgeries or hospitalizations. In regards to his hives he says that he will have occasional outbreaks for a day or so randomly.  These may occur every couple of months or so.  Thus overall he states that he has been under good control with use of cetirizine and famotidine 1 tablet twice a day of both.  Review of systems: Review of Systems  Constitutional: Negative.   HENT: Negative.   Eyes: Negative.   Respiratory: Negative.   Cardiovascular: Negative.   Gastrointestinal: Negative.   Musculoskeletal: Negative.   Skin: Negative for itching and rash.  Neurological: Negative.     All other systems negative unless noted above in HPI  Past medical/social/surgical/family history have been reviewed and are unchanged unless specifically indicated below.  No changes  Medication List: Allergies as of 01/25/2019      Reactions   Ace Inhibitors Swelling   Valsartan Swelling      Medication List       Accurate as of January 25, 2019 11:40 AM. If you have any questions, ask your nurse or doctor.        cetirizine 10 MG tablet Commonly known as: ZYRTEC Take 1 tablet (10 mg total) by mouth daily.   doxazosin 8 MG 24 hr tablet Commonly known as: CARDURA XL Take 8 mg by mouth daily with breakfast.   famotidine 20 MG tablet Commonly known as: PEPCID TAKE 1 TABLET BY MOUTH TWICE A DAY   finasteride 5 MG tablet Commonly known as: PROSCAR Take 5 mg by mouth daily.   glucose blood test strip Commonly known as: OneTouch Verio Use as instructed   hydrochlorothiazide 25 MG tablet Commonly known as: HYDRODIURIL  Take 1 tablet (25 mg total) by mouth daily.   metFORMIN 500 MG tablet Commonly known as: GLUCOPHAGE TAKE 2 TABLETS BY MOUTH TWICE A DAY WITH MEALS   ONE TOUCH DELICA LANCING DEV Misc Use to test blood sugar once a day. Dx code: P32.9   OneTouch Delica Lancets 51O Misc Use to test blood sugar once a day. Dx code: E11.9   OneTouch Verio w/Device Kit 1 Device by Does not apply route as needed.   pravastatin 80 MG tablet Commonly known as: PRAVACHOL Take 1 tablet (80 mg total) by mouth daily.   vitamin B-12 1000 MCG tablet Commonly known as: CYANOCOBALAMIN Take 1 tablet (1,000 mcg total) by mouth daily.       Known medication allergies: Allergies  Allergen Reactions  . Ace Inhibitors Swelling  . Valsartan Swelling     Physical examination: Blood pressure 116/68, pulse 78, temperature 97.7 F (36.5 C), temperature source Temporal, resp. rate 18, height 5' 11.5" (1.816 m), weight 151 lb (68.5 kg), SpO2 97 %.  General: Alert, interactive, in no acute distress. HEENT: PERRLA, TMs pearly gray, turbinates non-edematous without discharge, post-pharynx non erythematous. Neck: Supple without lymphadenopathy. Lungs: Clear to auscultation without wheezing, rhonchi or rales. {no increased work of breathing. CV: Normal S1, S2 without murmurs. Abdomen: Nondistended, nontender. Skin: Warm and dry, without lesions or rashes. Extremities:  No clubbing, cyanosis or edema. Neuro:   Grossly  intact.  Diagnositics/Labs: None today  Assessment and plan: Urticaria with angioedema   - at this time etiology of hives and swelling is due to autoimmune type (internal trigger).      - hives and swelling episodes have improved   - will try to wean down to Cetirizine 58m 1 tablet in the morning AND Famotidine 271m1 tablets in the morning   - if hives and swelling return then go back to taking Cetirizine and Famotidine twice a day  Follow-up 12 months or sooner if needed  I [facility] the  opp[facility]. Please do not hesitate to contact me with questions.  Sincerely,   ShPrudy FeelerMD Allergy/[facility]ology [facility] [place]

## 2019-02-05 ENCOUNTER — Ambulatory Visit (INDEPENDENT_AMBULATORY_CARE_PROVIDER_SITE_OTHER): Payer: Medicare Other | Admitting: Family Medicine

## 2019-02-05 ENCOUNTER — Other Ambulatory Visit: Payer: Self-pay

## 2019-02-05 VITALS — BP 125/80 | HR 116 | Wt 148.0 lb

## 2019-02-05 DIAGNOSIS — R634 Abnormal weight loss: Secondary | ICD-10-CM | POA: Diagnosis not present

## 2019-02-05 NOTE — Progress Notes (Signed)
Subjective:  HPI: [name] is a 78 y.o. presenting to clinic today to discuss the following:  Loss Appetite [name] is a 78y/o male with PMH of DM, HTN, and adenoma of the colon who presents today for poor appetite and weight loss. He states he is still eating good and is not sure why he is losing weight. On further questioning he states at times he is "just not hungry". He denies early satiety and eats 2-3 meals per day. He has poor recall as to what he has for meals. He drinks ensure on some days but not regularly.  He denies fever, chills, bone pain, night sweats, chest pain, abdominal pain, difficulty swallowing, nausea, vomiting, diarrhea, or constipation. No blood in the stool  ROS noted in HPI.   Past Medical, Surgical, Social, and Family History Reviewed & Updated per EMR.   Pertinent Historical Findings include:   Social History   Tobacco Use  Smoking Status Former Smoker  . Packs/day: 1.00  . Years: 30.00  . Pack years: 30.00  . Types: Cigarettes  . Quit date: 06/07/2001  . Years since quitting: 17.6  Smokeless Tobacco Former Systems developer  . Types: Chew  . Quit date: 06/07/2001    Objective: BP 125/80   Pulse (!) 116   Wt 148 lb (67.1 kg)   SpO2 97%   BMI 20.35 kg/m  Vitals and nursing notes reviewed  Physical Exam Gen: Alert and Oriented x 3, NAD HEENT: Normocephalic, atraumatic CV: RRR, no murmurs, normal S1, S2 split Resp: CTAB, no wheezing, rales, or rhonchi, comfortable work of breathing Abd: non-distended, non-tender, soft, +bs in all four quadrants Ext: no clubbing, cyanosis, or edema Skin: warm, dry, intact, no rashes  Results for orders placed or performed in visit on 02/05/19 (from the past 72 hour(s))  CBC     Status: Abnormal   Collection Time: 02/05/19  3:38 PM  Result Value Ref Range   WBC 5.0 3.4 - 10.8 x10E3/uL   RBC 4.19 4.14 - 5.80 x10E6/uL   Hemoglobin 11.8 (L) 13.0 - 17.7 g/dL   Hematocrit 36.4 (L) 37.5 - 51.0 %   MCV 87 79  - 97 fL   MCH 28.2 26.6 - 33.0 pg   MCHC 32.4 31.5 - 35.7 g/dL   RDW 12.5 11.6 - 15.4 %   Platelets 214 150 - 450 x10E3/uL  Comprehensive metabolic panel     Status: Abnormal   Collection Time: 02/05/19  3:38 PM  Result Value Ref Range   Glucose 133 (H) 65 - 99 mg/dL   BUN 16 8 - 27 mg/dL   Creatinine, Ser 1.28 (H) 0.76 - 1.27 mg/dL   GFR calc non Af Amer 53 (L) >59 mL/min/1.73   GFR calc Af Amer 61 >59 mL/min/1.73   BUN/Creatinine Ratio 13 10 - 24   Sodium 138 134 - 144 mmol/L   Potassium 4.1 3.5 - 5.2 mmol/L   Chloride 101 96 - 106 mmol/L   CO2 20 20 - 29 mmol/L   Calcium 10.6 (H) 8.6 - 10.2 mg/dL   Total Protein 7.8 6.0 - 8.5 g/dL   Albumin 4.2 3.7 - 4.7 g/dL   Globulin, Total 3.6 1.5 - 4.5 g/dL   Albumin/Globulin Ratio 1.2 1.2 - 2.2   Bilirubin Total 0.3 0.0 - 1.2 mg/dL   Alkaline Phosphatase 43 39 - 117 IU/L   AST 44 (H) 0 - 40 IU/L   ALT 42 0 - 44 IU/L  Assessment/Plan:  Weight loss Patient does have documented weight loss. Unclear as to the cause at this time however given my interaction with him I am somewhat suspicious he may be developing an underlying dementia and is forgetting to eat. He does have a history of an adenoma found on colonoscopy in 2017 but I could not find a copy of the path report. It does not look like he had any further follow up with GI. Last A1c was good at 6.3% so delayed gastric empty 2/2 to diabetes is less likely. - Ambulatory referral to GI to ensure he does not need a repeat colonoscopy after 2017 findings - When he returns for his PCP visit I suggest getting MME - CBC and CMP today to look for any abnormalities that would suggest a physiologic cause.   PATIENT EDUCATION PROVIDED: See AVS    Diagnosis and plan along with any newly prescribed medication(s) were discussed in detail with this patient today. The patient verbalized understanding and agreed with the plan. Patient advised if symptoms worsen return to clinic or ER.   Orders  Placed This Encounter  Procedures  . CBC  . Comprehensive metabolic panel    Order Specific Question:   Has the patient fasted?    Answer:   No  . Ambulatory referral to Gastroenterology    Referral Priority:   Routine    Referral Type:   Consultation    Referral Reason:   Specialty Services Required    Number of Visits Requested:   [place], DO 02/05/2019, 2:10 PM PGY-3 [place]

## 2019-02-05 NOTE — Patient Instructions (Addendum)
It was great to meet you today! Thank you for letting me participate in your care!  Today, we discussed your recent weight loss. I am sending you to GI to have them follow up on your colonoscopy and perhaps do some other studies to discover your weight loss.  Be well, [name] Rutherford, DO PGY-3, [facility]

## 2019-02-06 DIAGNOSIS — R634 Abnormal weight loss: Secondary | ICD-10-CM | POA: Insufficient documentation

## 2019-02-06 LAB — CBC
Hematocrit: 36.4 % — ABNORMAL LOW (ref 37.5–51.0)
Hemoglobin: 11.8 g/dL — ABNORMAL LOW (ref 13.0–17.7)
MCH: 28.2 pg (ref 26.6–33.0)
MCHC: 32.4 g/dL (ref 31.5–35.7)
MCV: 87 fL (ref 79–97)
Platelets: 214 10*3/uL (ref 150–450)
RBC: 4.19 x10E6/uL (ref 4.14–5.80)
RDW: 12.5 % (ref 11.6–15.4)
WBC: 5 10*3/uL (ref 3.4–10.8)

## 2019-02-06 LAB — COMPREHENSIVE METABOLIC PANEL
ALT: 42 IU/L (ref 0–44)
AST: 44 IU/L — ABNORMAL HIGH (ref 0–40)
Albumin/Globulin Ratio: 1.2 (ref 1.2–2.2)
Albumin: 4.2 g/dL (ref 3.7–4.7)
Alkaline Phosphatase: 43 IU/L (ref 39–117)
BUN/Creatinine Ratio: 13 (ref 10–24)
BUN: 16 mg/dL (ref 8–27)
Bilirubin Total: 0.3 mg/dL (ref 0.0–1.2)
CO2: 20 mmol/L (ref 20–29)
Calcium: 10.6 mg/dL — ABNORMAL HIGH (ref 8.6–10.2)
Chloride: 101 mmol/L (ref 96–106)
Creatinine, Ser: 1.28 mg/dL — ABNORMAL HIGH (ref 0.76–1.27)
GFR calc Af Amer: 61 mL/min/{1.73_m2} (ref >59)
GFR calc non Af Amer: 53 mL/min/{1.73_m2} — ABNORMAL LOW (ref >59)
Globulin, Total: 3.6 g/dL (ref 1.5–4.5)
Glucose: 133 mg/dL — ABNORMAL HIGH (ref 65–99)
Potassium: 4.1 mmol/L (ref 3.5–5.2)
Sodium: 138 mmol/L (ref 134–144)
Total Protein: 7.8 g/dL (ref 6.0–8.5)

## 2019-02-06 NOTE — Assessment & Plan Note (Signed)
Patient does have documented weight loss. Unclear as to the cause at this time however given my interaction with him I am somewhat suspicious he may be developing an underlying dementia and is forgetting to eat. He does have a history of an adenoma found on colonoscopy in 2017 but I could not find a copy of the path report. It does not look like he had any further follow up with GI. Last A1c was good at 6.3% so delayed gastric empty 2/2 to diabetes is less likely. - Ambulatory referral to GI to ensure he does not need a repeat colonoscopy after 2017 findings - When he returns for his PCP visit I suggest getting MME - CBC and CMP today to look for any abnormalities that would suggest a physiologic cause.

## 2019-02-09 ENCOUNTER — Telehealth: Payer: Self-pay

## 2019-02-09 NOTE — Telephone Encounter (Signed)
Called patient to schedule a 1 month follow up with PCP and patient states that he needed to see the doctor (immediately) before 1 month. Patient wanted to come in today or tomorrow.  Scheduled patient for 02/13/2019 with Dr. Tarry Kos.  Patient states that he just wants to talk to doctor about concerns he has.  Ozella Almond, [place]

## 2019-02-13 ENCOUNTER — Other Ambulatory Visit: Payer: Self-pay

## 2019-02-13 ENCOUNTER — Encounter: Payer: Self-pay | Admitting: Family Medicine

## 2019-02-13 ENCOUNTER — Ambulatory Visit (INDEPENDENT_AMBULATORY_CARE_PROVIDER_SITE_OTHER): Payer: Medicare Other | Admitting: Family Medicine

## 2019-02-13 VITALS — BP 118/58 | HR 93 | Wt 147.8 lb

## 2019-02-13 DIAGNOSIS — R413 Other amnesia: Secondary | ICD-10-CM | POA: Diagnosis not present

## 2019-02-13 DIAGNOSIS — R634 Abnormal weight loss: Secondary | ICD-10-CM

## 2019-02-13 NOTE — Patient Instructions (Addendum)
Thank you so much for ocming in to see me.  I have placed referral to the Geriatrics clinic. Please expect a call from them to schedule the appointment. It would be best if you could bring a relative with you.  I have also scheduled a follow up appointment with your PCP for 03/09/2019 at 1:30pm.  I have ordered blood work and head imaging. It will be important for you to get the CT head completed.   Please call the clinic sooner if any questions or concerns.  Take care, Dr. Tarry Kos

## 2019-02-13 NOTE — Progress Notes (Signed)
Subjective:   Patient ID: [name]    DOB: Oct 26, 1940, 78 y.o. male   MRN: [id number]  [name] is a 78 y.o. male with a history of HTN, HLD, allergies, adenoma of colon, DM, hyperthyroidism, chronic urticaria, osteoarthritis  here for decreased appetite.   Weight Loss: Patient returns today stating he "does not know why he is here, he was [name] told to come in". When asked if he has any concerns he states "I dont have much of an appetite". Per chart review, he was seen on 8/31 by Dr. Garlan Fillers with similar presentation. Concern at that time was dementia vs malignancy for unexplained weight loss. Patient's weight today: 147.8lbs, weight on 8/31: 148lbs. He notes he does not recall being seen 10 days ago. He notes he eats 2x day - lunch and breakfast. He notes he eats "normal meals", when asked to elaborate he notes "grits, oatmeal, sausage, beans, vegetable". He notes he cooks for himself. He notes his "appetite is no good; not so crazy about eating foods". Patient lives alone, drives independently. Occasionally misplaces things around the house but denies getting lost when he drives around. He does endorse night sweats, but denies any fevers, chills, bone pain, abdominal pain, nausea, vomiting, diarrhea, constipation, melena or blood in stool. He also denies cough, chest pain, SOB.   Denies delusions or hallucinations. Denies trouble ambulating. He notes "sometimes I feel depressed". He has family that lives locally.   Review of Systems:  Per HPI.   [place], medications and smoking status reviewed.  Objective:   BP (!) 118/58   Pulse 93   Wt 147 lb 12.8 oz (67 kg)   SpO2 98%   BMI 20.33 kg/m  Vitals and nursing note reviewed.  General: well nourished, well developed, in no acute distress with non-toxic appearance, siting comfortably in exam chair with mask and dark sunglasses, walking in with cane Resp: Breathing comfortably on room air, speaking in full sentences Skin: warm,  dry Extremities: warm and well perfused Neuro: Alert, speech normal  Assessment & Plan:   Weight loss Unchanged. Initial work up ~10 days ago included CBC, CMP, and GI referral. Upon review of labs, no abnormalities that suggest physiologic cause (Electrolytes, total protein, and albumin WNL, Kidney function only mildly elevated compared to baseline (1.28, BL 1.0). Liver enzymes WNL. Total Globulin WNL. Hgb 11.8 however appears to be at about baseline for patient (12-13). Thyroid WNL in May 2020.). Patient does have a significant smoking history but has quit >15 years and has no history of cough which places him at lower risk for lung cancer. My biggest concern is his memory and believe this may be contributing to his weight loss. Mini Mental exam today was abnormal (Results below and see media for clock drawing results). At this time, recommend referral to [facility] for further evaluation with follow up with PCP in 1 month for further evaluation of weight loss. Attempted to complete PHQ-2 but patient had difficulty understanding and answering questions - Referral to Geriatric Clinic - message sent to [facility] [place]  - of note, no openings in Geriatric clinic until November   - HIV, RPR, B12, CT head w/out contrast - follow up with PCP in 1 month to evaluate weight loss and consider further evaluation if indicated - Per Dr Saul Fordyce recs, at follow up visit: Recommend repeat CMP.. If persistent hypercalcemia, recommend consideration of SPEP, urine immunofixation. If continued weight loss, given notable tobacco use hx, would obtain chest CT.  Ensure  follow up with GI for colonoscopy at follow up, also has a history of colectomy  Memory loss Unable to recall being seen 10 days prior or that he called to schedule this appointment. Abnormal MiniCog during exam today.  Last MOCA score in 2018 was noted to be 12 consistent with moderate cognitive impairment. He does live alone and is functional with both ADLs  and IADLs per patient. No medications that would appear to cause memory impairment. Depression is possible and could be contributing. I am very concern about his memory loss, especially given he lives alone. Recommend further evaluation.  - Plan as above  Mini-Cog - 02/13/19 1346    Normal clock drawing test?  no    How many words correct?  0          Orders Placed This Encounter  Procedures  . CT Head Wo Contrast    Standing Status:   Future    Standing Expiration Date:   05/14/2020    Order Specific Question:   Preferred imaging location?    Answer:   [facility]    Order Specific Question:   Radiology Contrast Protocol - do NOT remove file path    Answer:   [email or web address]  . HIV antibody (with reflex)  . Vitamin B12  . RPR    Mina Marble, DO PGY-2, [facility] 02/15/2019 12:16 AM

## 2019-02-14 NOTE — Assessment & Plan Note (Addendum)
Unchanged. Initial work up ~10 days ago included CBC, CMP, and GI referral. Upon review of labs, no abnormalities that suggest physiologic cause (Electrolytes, total protein, and albumin WNL, Kidney function only mildly elevated compared to baseline (1.28, BL 1.0). Liver enzymes WNL. Total Globulin WNL. Hgb 11.8 however appears to be at about baseline for patient (12-13). Thyroid WNL in May 2020.). Patient does have a significant smoking history but has quit >15 years and has no history of cough which places him at lower risk for lung cancer. My biggest concern is his memory and believe this may be contributing to his weight loss. Mini Mental exam today was abnormal (Results below and see media for clock drawing results). At this time, recommend referral to [facility] for further evaluation with follow up with PCP in 1 month for further evaluation of weight loss. Attempted to complete PHQ-2 but patient had difficulty understanding and answering questions - Referral to Geriatric Clinic - message sent to [place]  - of note, no openings in Geriatric clinic until November   - HIV, RPR, B12, CT head w/out contrast - follow up with PCP in 1 month to evaluate weight loss and consider further evaluation if indicated - Per Dr Saul Fordyce recs, at follow up visit: Recommend repeat CMP.. If persistent hypercalcemia, recommend consideration of SPEP, urine immunofixation. If continued weight loss, given notable tobacco use hx, would obtain chest CT.  Ensure follow up with GI for colonoscopy at follow up, also has a history of colectomy

## 2019-02-15 NOTE — Assessment & Plan Note (Addendum)
Unable to recall being seen 10 days prior or that he called to schedule this appointment. Abnormal MiniCog during exam today.  Last MOCA score in 2018 was noted to be 12 consistent with moderate cognitive impairment. He does live alone and is functional with both ADLs and IADLs per patient. No medications that would appear to cause memory impairment. Depression is possible and could be contributing. I am very concern about his memory loss, especially given he lives alone. Recommend further evaluation.  - Plan as above

## 2019-02-17 LAB — RNA QUALITATIVE: HIV 1 RNA Qualitative: NEGATIVE

## 2019-02-17 LAB — VITAMIN B12: Vitamin B-12: 587 pg/mL (ref 232–1245)

## 2019-02-17 LAB — HIV 1/2 AB DIFFERENTIATION
HIV 1 Ab: NEGATIVE
HIV 2 Ab: NEGATIVE
NOTE (HIV CONF MULTIP: NEGATIVE

## 2019-02-17 LAB — RPR: RPR Ser Ql: NONREACTIVE

## 2019-02-17 LAB — HIV ANTIBODY (ROUTINE TESTING W REFLEX): HIV Screen 4th Generation wRfx: REACTIVE — AB

## 2019-02-19 ENCOUNTER — Other Ambulatory Visit: Payer: Self-pay | Admitting: Family Medicine

## 2019-02-19 DIAGNOSIS — R75 Inconclusive laboratory evidence of human immunodeficiency virus [HIV]: Secondary | ICD-10-CM

## 2019-02-19 NOTE — Progress Notes (Signed)
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 This is an indeterminate result may indicate false positive vs early infection. Will recommend retest in 3 months to confirm accuracy. Please have patient schedule lab appointment in 3 months to have this test done. Will place future order now.

## 2019-02-21 ENCOUNTER — Ambulatory Visit (HOSPITAL_COMMUNITY)
Admission: RE | Admit: 2019-02-21 | Discharge: 2019-02-21 | Disposition: A | Discharge status: Home / Self Care | Payer: Medicare Other | Source: Ambulatory Visit | Attending: Family Medicine | Admitting: Family Medicine

## 2019-02-21 ENCOUNTER — Other Ambulatory Visit: Payer: Self-pay

## 2019-02-21 DIAGNOSIS — R413 Other amnesia: Secondary | ICD-10-CM | POA: Diagnosis not present

## 2019-02-21 NOTE — Progress Notes (Signed)
Patient informed of results and lab appt scheduled for 05/23/19 to have repeat HIV screen.

## 2019-02-22 ENCOUNTER — Other Ambulatory Visit: Payer: Self-pay | Admitting: *Deleted

## 2019-02-23 MED ORDER — METFORMIN HCL 500 MG PO TABS: ORAL_TABLET | ORAL | 3 refills | Status: DC

## 2019-03-08 ENCOUNTER — Telehealth: Payer: Self-pay | Admitting: *Deleted

## 2019-03-08 NOTE — Telephone Encounter (Signed)
LM for patient.  Reminded him of his appt tomorrow with Dr. Ouida Sills.  Made a note on appt line for staff to come find me when he checks in tomorrow so I can make his GERI appt and also give him the paperwork.  Jazmin Hartsell,CMA

## 2019-03-08 NOTE — Telephone Encounter (Signed)
-----   Message from Valerie Roys, [place] sent at 02/14/2019  9:54 AM EDT ----- [facility] for memory loss.

## 2019-03-09 ENCOUNTER — Ambulatory Visit: Payer: Medicare Other | Admitting: Student in an Organized Health Care Education/Training Program

## 2019-03-09 NOTE — Telephone Encounter (Signed)
We have attempted to contact the patient multiple times and leaving voicemails.  Also attempted x1 to reach patient's POC, sister without an answer. Patient failed to appear to his appointment today and is typically known to be adherent with his appointments.  Called [place] emergency line for a welfare check at [phone] today. They stated they would go out to see him within the hour and will contact us at the clinic with findings.

## 2019-03-09 NOTE — Telephone Encounter (Signed)
Will forward to MD.  [employer] called back and they were able to locate patient.  Will see if she can get him to reschedule for her or is he ok with waiting until [place] clinic in November. Jazmin Hartsell,CMA

## 2019-03-09 NOTE — Telephone Encounter (Signed)
[facility] Police called back and they went to patient's home but he didn't answer.  PCP made aware.  Shawnette Augello,CMA

## 2019-03-12 ENCOUNTER — Encounter: Payer: Self-pay | Admitting: Family Medicine

## 2019-05-01 ENCOUNTER — Other Ambulatory Visit: Payer: Self-pay

## 2019-05-01 DIAGNOSIS — L508 Other urticaria: Secondary | ICD-10-CM

## 2019-05-01 MED ORDER — CETIRIZINE HCL 10 MG PO TABS: 10.0000 mg | ORAL_TABLET | Freq: Two times a day (BID) | ORAL | 2 refills | Status: DC

## 2019-05-23 ENCOUNTER — Other Ambulatory Visit: Payer: Medicare Other

## 2019-07-17 ENCOUNTER — Telehealth: Payer: Self-pay | Admitting: *Deleted

## 2019-07-17 NOTE — Telephone Encounter (Signed)
Tried to contact pt to go over screening questions prior to visit and phone said call could not be completed at this time. [name], CMA

## 2019-07-18 ENCOUNTER — Other Ambulatory Visit: Payer: Self-pay

## 2019-07-18 ENCOUNTER — Ambulatory Visit (INDEPENDENT_AMBULATORY_CARE_PROVIDER_SITE_OTHER): Payer: Medicare PPO | Admitting: Student in an Organized Health Care Education/Training Program

## 2019-07-18 ENCOUNTER — Encounter: Payer: Self-pay | Admitting: Student in an Organized Health Care Education/Training Program

## 2019-07-18 VITALS — BP 108/56 | HR 83 | Wt 142.8 lb

## 2019-07-18 DIAGNOSIS — R220 Localized swelling, mass and lump, head: Secondary | ICD-10-CM | POA: Diagnosis not present

## 2019-07-18 DIAGNOSIS — E1165 Type 2 diabetes mellitus with hyperglycemia: Secondary | ICD-10-CM

## 2019-07-18 DIAGNOSIS — Z794 Long term (current) use of insulin: Secondary | ICD-10-CM | POA: Diagnosis not present

## 2019-07-18 DIAGNOSIS — Z23 Encounter for immunization: Secondary | ICD-10-CM | POA: Diagnosis not present

## 2019-07-18 DIAGNOSIS — R413 Other amnesia: Secondary | ICD-10-CM | POA: Diagnosis not present

## 2019-07-18 DIAGNOSIS — Z5320 Procedure and treatment not carried out because of patient's decision for unspecified reasons: Secondary | ICD-10-CM | POA: Insufficient documentation

## 2019-07-18 DIAGNOSIS — Z114 Encounter for screening for human immunodeficiency virus [HIV]: Secondary | ICD-10-CM | POA: Diagnosis not present

## 2019-07-18 LAB — POCT GLYCOSYLATED HEMOGLOBIN (HGB A1C): HbA1c, POC (controlled diabetic range): 6 % (ref 0.0–7.0)

## 2019-07-18 MED ORDER — FLUTICASONE PROPIONATE 50 MCG/ACT NA SUSP: 2.0000 | Freq: Every day | NASAL | 6 refills | Status: DC

## 2019-07-18 NOTE — Assessment & Plan Note (Signed)
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 Have attempted to confirm testing for several months but patient has missed several appointments. Offered to retest today and patient refused passionately stating he does not have no diseases like that. I assured him that I believe him and that sometimes the tests can give a false reading so I just want to confirm that he's negative but he continued to decline.

## 2019-07-18 NOTE — Assessment & Plan Note (Signed)
A1c is 6.0 which is below goal range for patient's age. Will discontinue metformin at this time and recheck in 3 months

## 2019-07-18 NOTE — Assessment & Plan Note (Signed)
Mild swelling to under eyes with no impact on vision likely a normal part of aging but could be worsened by allergies.  Prescribed flonase.  Return if not improved or if begins to bother the patient more

## 2019-07-18 NOTE — Progress Notes (Signed)
   CHIEF COMPLAINT / HPI: eye swelling  Patient otherwise feeling well complains of multiple weeks of left eye swelling. Denies injury to the area. No drainage, no pain, no pruritus, no erythema or increased redness. Has not tried anything to alleviate. Denies headache, visual changes.   PERTINENT  PMH / PSH: allergies   OBJECTIVE: BP (!) 108/56   Pulse 83   Wt 142 lb 12.8 oz (64.8 kg)   SpO2 98%   BMI 19.64 kg/m   General: NAD, pleasant, able to participate in exam HEENT:  Eyes- bilateral under eye puffiness with left being slightly worse than right. I would not have noticed a difference with casual glance. Palpation reveals no tenderness, masses, induration, emphysema, crepitus.  Pupillary reactions normal bilateral and EOM intact. No conjunctival injections, sclera non-icterus Moist mucous membranes. 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 Have attempted to confirm testing for several months but patient has missed several appointments. Offered to retest today and patient refused passionately stating he does not have no diseases like that. I assured him that I believe him and that sometimes the tests can give a false reading so I just want to confirm that he's negative but he continued to decline.   Swelling around both eyes Mild swelling to under eyes with no impact on vision likely a normal part of aging but could be worsened by allergies.  Prescribed flonase.  Return if not improved or if begins to bother the patient more  Diabetes mellitus A1c is 6.0 which is below goal range for patient's age. Will discontinue metformin at this time and recheck in 3 months  Memory loss Concerned for advancing dementia.  Patient has missed several recent appointments  including geriatric clinic.  His behavior today was different than what I have known in the past year.  He refused to discuss memory issues and was very focused on his under eye swelling. Declined any further discussions on any other topic. I will refer patient to CCM in hopes that his living situation can be evaluated for his safety and perhaps can attempt to get him to [facility] clinic again for memory assessment.  Would consider bringing back in soon for further workup of organic causes of his behavior changes if he is open to the idea.   Orders Placed This Encounter  Procedures  . Pneumococcal polysaccharide vaccine 23-valent greater than or equal to 2yo subcutaneous/IM  . Flu Vaccine QUAD High Dose(Fluad)  . HIV antibody (with reflex)  . Ambulatory referral to [facility] Services  . POCT glycosylated hemoglobin (Hb A1C)    Richarda Osmond, [place]

## 2019-07-18 NOTE — Assessment & Plan Note (Signed)
Concerned for advancing dementia.  Patient has missed several recent appointments including geriatric clinic.  His behavior today was different than what I have known in the past year.  He refused to discuss memory issues and was very focused on his under eye swelling. Declined any further discussions on any other topic. I will refer patient to CCM in hopes that his living situation can be evaluated for his safety and perhaps can attempt to get him to [facility] clinic again for memory assessment.  Would consider bringing back in soon for further workup of organic causes of his behavior changes if he is open to the idea.

## 2019-07-18 NOTE — Patient Instructions (Signed)
It was a pleasure to see you today!  To summarize our discussion for this visit:  We can try to treat for allergies to see if this will help with your eye swelling.   Also, we should test for HIV today as you had a positive test a few months ago and I would like to confirm if this was true or not.  You are due for flu and pneumonia vaccines today  Some additional health maintenance measures we should update are: Health Maintenance Due  Topic Date Due  . FOOT EXAM  11/08/2018  . PNA vac Low Risk Adult (2 of 2 - PPSV23) 11/08/2018  . OPHTHALMOLOGY EXAM  11/10/2018  . INFLUENZA VACCINE  01/06/2019  . URINE MICROALBUMIN  06/23/2019  .    Please return to our clinic to see me as needed.  Call the clinic at [phone] if your symptoms worsen or you have any concerns.   Thank you for allowing me to take part in your care,  Dr. Doristine Mango

## 2019-07-24 ENCOUNTER — Ambulatory Visit: Payer: Self-pay | Admitting: Licensed Clinical Social Worker

## 2019-07-24 NOTE — Chronic Care Management (AMB) (Signed)
   Social Work  Care Management Consultation  07/24/2019 Name: [name] MRN: [id number] DOB: [birth year]  [name] is a 79 y.o. year old male who sees Richarda Osmond, DO for primary care. LCSW was consulted by PCP for information /resources to assistance patient with memory issues and possible concerns with his living situation . Patient was not interviewed or contacted during this encounter however LCSW reviewed chart, notes, insurance and collaborated with CCM RN .     Recommendation: After consultation with CCM RN and reviewing the chart. It is determined patient may benefit from referral to [facility] clinic. It does not look like patient is open to discussing some of the concerns PCP wants LCSW to explore.   Also informed PCP that is she believes patient is in need of protection to contact Adult Protective Services at [place].    Intervention: Relevant information and resources discussed and shared with provider. Provider will give information to patient.   Review of patient status, including review of consultants reports, relevant laboratory and other test results, and collaboration with appropriate care team members and the patient's provider was performed as part of comprehensive patient evaluation and provision of chronic care management services.    Plan:  1. LCSW will pend this referral until patient is scheduled for [facility] clinic  2. If patient is not schedule for Geri clinic LCSW will reach out to patient after PCP contacts him and he is open to LCSW calling him to assess needs 2. No further follow up required by LCSW at this time  [name], [place] [address]   [phone] 2:17 PM

## 2019-08-02 ENCOUNTER — Telehealth: Payer: Self-pay | Admitting: *Deleted

## 2019-08-02 ENCOUNTER — Encounter: Payer: Self-pay | Admitting: Student in an Organized Health Care Education/Training Program

## 2019-08-02 NOTE — Progress Notes (Signed)
Mailed letter to patient explaining his provider's concerns and asking him to contact me once he has looked over the information pamphlet.  Will update CCM social work and likely close referral until we hear from him.  Ezana Hubbert,CMA

## 2019-08-02 NOTE — Telephone Encounter (Signed)
error 

## 2019-08-05 ENCOUNTER — Other Ambulatory Visit: Payer: Self-pay | Admitting: Student in an Organized Health Care Education/Training Program

## 2019-08-05 DIAGNOSIS — E785 Hyperlipidemia, unspecified: Secondary | ICD-10-CM

## 2019-08-09 ENCOUNTER — Other Ambulatory Visit: Payer: Self-pay | Admitting: Student in an Organized Health Care Education/Training Program

## 2019-08-17 ENCOUNTER — Other Ambulatory Visit: Payer: Self-pay | Admitting: Student in an Organized Health Care Education/Training Program

## 2019-10-11 ENCOUNTER — Other Ambulatory Visit: Payer: Self-pay | Admitting: Allergy

## 2019-10-11 DIAGNOSIS — L508 Other urticaria: Secondary | ICD-10-CM

## 2019-11-08 ENCOUNTER — Other Ambulatory Visit: Payer: Self-pay | Admitting: Student in an Organized Health Care Education/Training Program

## 2019-11-08 DIAGNOSIS — I10 Essential (primary) hypertension: Secondary | ICD-10-CM

## 2019-11-12 NOTE — Telephone Encounter (Signed)
Please ask patient to schedule a follow up visit with PCP Thanks Leeanne Rio, MD

## 2019-11-12 NOTE — Telephone Encounter (Signed)
Attempted to call pt no answer and unable to leave a voicemail. Will try again later. Deseree Kennon Holter, CMA

## 2019-11-14 NOTE — Telephone Encounter (Signed)
Attempted to call patient and there is no answer.    Called patient and LVM on his sister's line Gershon Cull to have patient call to make an appointment.  Ozella Almond, [address]

## 2019-11-26 ENCOUNTER — Ambulatory Visit: Payer: Medicare PPO | Admitting: Student in an Organized Health Care Education/Training Program

## 2019-11-28 ENCOUNTER — Ambulatory Visit: Payer: Medicare PPO | Admitting: Family Medicine

## 2019-11-28 ENCOUNTER — Other Ambulatory Visit: Payer: Self-pay

## 2019-11-28 VITALS — BP 125/70 | HR 64 | Ht 74.0 in | Wt 161.2 lb

## 2019-11-28 DIAGNOSIS — Z794 Long term (current) use of insulin: Secondary | ICD-10-CM

## 2019-11-28 DIAGNOSIS — I10 Essential (primary) hypertension: Secondary | ICD-10-CM

## 2019-11-28 DIAGNOSIS — E1165 Type 2 diabetes mellitus with hyperglycemia: Secondary | ICD-10-CM | POA: Diagnosis not present

## 2019-11-28 NOTE — Assessment & Plan Note (Addendum)
Well controlled on current medications. Currently on finasteride (which may have some help to his blood pressure) and HCTZ - BMP today to check kidney function since he is on a diuretic

## 2019-11-28 NOTE — Progress Notes (Signed)
    SUBJECTIVE:   CHIEF COMPLAINT / HPI:    Headaches/Memory Patient presents today for follow up. He states he has "headaches" but they do not hurt his head all the time. He endorses trauma to his head many years in the past and thinks that is why he is having "trouble remembering things like he should". Overall, he feels more forgetful. He lives alone, has family in the area that do call him and check on him. He still drives, has no difficulty getting to the bathroom, still pays his own bills, still cooks his own food, and does his own laundry. Overall, he feels very independent and is not scared of being alone. He also talk about a "knot" on the left side of his head on his temple area but states it does not hurt and does not bother him. He states the headaches he is having are typical and unchanged and he has had them for years.  T2DM Well controlled with diet. No longer on Metformin as it was discontinued at last visit with A1c of 6.0%. Will repeat today but patient says his diet is good and he is eating 3 meals a day every day. Typically has a big breakfeast with eggs, bacon, sausage, and toast. Eats a sandwich for lunch, and typically cooks his own dinner. He is staying away from sweets and snacks.  HTN Patient denies vision changes, dizziness, difficulty walking, no chest pain, no difficulty breathing. He is taking his medications as prescribed.   PERTINENT  PMH / PSH: HTN, T2DM, HLD, Hx of anemia  OBJECTIVE:   BP 125/70   Pulse 64   Ht 6\' 2"  (1.88 m)   Wt 161 lb 3.2 oz (73.1 kg)   SpO2 98%   BMI 20.70 kg/m   Gen: NAD Cardiac: RRR, soft systolic murmur Resp: CTAB  ASSESSMENT/PLAN:   Diabetes mellitus Well controlled with diet at last visit and was taken off Metformin - Repeat A1c today and continue with diet control if it is still below goal  Essential hypertension Well controlled on current medications. Currently on finasteride (which may have some help to his blood  pressure) and HCTZ - BMP today to check kidney function since he is on a diuretic  Headache/Memory Unclear what is actually going on as patient sometimes says he has pain but then states his head doesn't hurt, he has difficulty remembering things. Concern for dementia was brought up by PCP but he declines Geriatric clinic. He appears appropriate today and answers all questions appropriately and is doing all his ADLs. - Tylenol for headaches when he is having pain.  Murmur Heard a slight soft diastolic murmur on exam today with no previous documentation that he has a murmur. No symptoms and overall he states he has never had chest pain or SOB or dizziness or syncope - If still present at next visit I would get an echo - F/u in one month   Nuala Alpha, [place]

## 2019-11-28 NOTE — Patient Instructions (Signed)
It was great to meet you today! Thank you for letting me participate in your care!  Today, we discussed your headaches and I recommend taking Tylenol. I am getting some blood work from you today and if anything is abnormal I will call you.  Be well, Harolyn Rutherford, DO PGY-3, [facility]

## 2019-11-28 NOTE — Assessment & Plan Note (Signed)
Well controlled with diet at last visit and was taken off Metformin - Repeat A1c today and continue with diet control if it is still below goal

## 2019-11-29 LAB — BASIC METABOLIC PANEL
BUN/Creatinine Ratio: 7 — ABNORMAL LOW (ref 10–24)
BUN: 9 mg/dL (ref 8–27)
CO2: 21 mmol/L (ref 20–29)
Calcium: 9.5 mg/dL (ref 8.6–10.2)
Chloride: 108 mmol/L — ABNORMAL HIGH (ref 96–106)
Creatinine, Ser: 1.29 mg/dL — ABNORMAL HIGH (ref 0.76–1.27)
GFR calc Af Amer: 60 mL/min/{1.73_m2} (ref >59)
GFR calc non Af Amer: 52 mL/min/{1.73_m2} — ABNORMAL LOW (ref >59)
Glucose: 146 mg/dL — ABNORMAL HIGH (ref 65–99)
Potassium: 4 mmol/L (ref 3.5–5.2)
Sodium: 140 mmol/L (ref 134–144)

## 2019-11-29 LAB — HEMOGLOBIN A1C
Est. average glucose Bld gHb Est-mCnc: 151 mg/dL
Hgb A1c MFr Bld: 6.9 % — ABNORMAL HIGH (ref 4.8–5.6)

## 2019-12-06 ENCOUNTER — Other Ambulatory Visit: Payer: Self-pay

## 2019-12-06 ENCOUNTER — Ambulatory Visit (INDEPENDENT_AMBULATORY_CARE_PROVIDER_SITE_OTHER): Payer: Medicare PPO | Admitting: Family Medicine

## 2019-12-06 VITALS — BP 142/80 | HR 101 | Ht 74.0 in | Wt 162.4 lb

## 2019-12-06 DIAGNOSIS — D509 Iron deficiency anemia, unspecified: Secondary | ICD-10-CM | POA: Diagnosis not present

## 2019-12-06 DIAGNOSIS — G44219 Episodic tension-type headache, not intractable: Secondary | ICD-10-CM | POA: Diagnosis not present

## 2019-12-06 DIAGNOSIS — R238 Other skin changes: Secondary | ICD-10-CM | POA: Diagnosis not present

## 2019-12-06 DIAGNOSIS — R413 Other amnesia: Secondary | ICD-10-CM

## 2019-12-06 NOTE — Patient Instructions (Signed)
Thank you for coming to see me today. It was a pleasure to see you.   I am obtaining some labs to further look into the cause for your scalp irritation.  You can try head and shoulders for your scalp.  If you feel any pain you can try Tylenol every 6 hours as needed.  Please follow-up with your PCP Dr. Ouida Sills in 1 week for further evaluation.  We are checking some labs today, I will call you if they are abnormal will send you a MyChart message or a letter if they are normal.  If you do not hear about your labs in the next 2 weeks please let us know.  If you have any questions or concerns, please do not hesitate to call the office at [phone].  Take Care,  Dr. Mina Marble, DO Resident Physician [place] [phone]

## 2019-12-06 NOTE — Progress Notes (Signed)
Subjective:   Patient ID: [name]    DOB: 1940/07/06, 79 y.o. male   MRN: [id number]  [name] is a 79 y.o. male with a history of hypertension, diabetes, chronic urticaria, osteoarthritis, B12 deficiency, iron deficiency anemia, hyperlipidemia, insomnia, memory loss, weight loss here for abnormal scalp sensation.  Abnormal Scalp Sensation: Patient presenting today endorsing intermittent left temple pain and a sensation of "something crawling around in my scalp".  He notes that it sometimes feels like numbness and tingling along the scalp.  It comes and goes.  He is unable to determine how long it lasts.  He has not done anything to treat this.  He notes that the intermittent left temple pain will radiate around to the back of his head into the other side.  Denies any vision changes, weakness, slurred speech, facial droop, itchiness of the scalp, fever, chills, nausea, vomiting.  He also denies any peripheral neuropathy.  Denies any rashes.  Per chart review there is been concern for memory loss and possible worsening of dementia.  He had a CT done in 2020 that showed age-related volume loss.  Review of Systems:  Per HPI.   Objective:   BP (!) 142/80   Pulse (!) 101   Ht 6\' 2"  (1.88 m)   Wt 162 lb 6.4 oz (73.7 kg)   SpO2 97%   BMI 20.85 kg/m  Vitals and nursing note reviewed.  General: Pleasant but uncooperative older male, sitting comfortably in exam chair, well nourished, well developed, in no acute distress with non-toxic appearance HEENT: normocephalic, atraumatic, moist mucous membranes, oropharynx without erythema or exudate, ears normal bilaterally, no rash appreciated in external ear canal bilaterally, skull nontender to palpation, no reproduction of symptoms with tapping of right and left temple  Neck: supple, normal ROM Resp: Breathing comfortably on room air, speaking in full sentences Abdomen: soft, non-tender, non-distended, no masses or organomegaly  palpable, normoactive bowel sounds Skin: warm, dry, scalp with areas of flaking but no signs of rash, small 0.5 x 0.5 cm nodule at right temple that is mildly tender to palpation, no erythema, edema, or discharge  Extremities: warm and well perfused MSK: strength intact,  Gait stabilized with cane Neuro: pupils are pinpoint and nonreactive, EOMI, CN II-XII grossly intact, uvula midline, smile symmetric, finger to nose normal  CT head without contrast 02/22/19: Age related volume loss. Brain parenchyma appears unremarkable. No mass or hemorrhage. Areas of paranasal sinus disease noted.  Assessment & Plan:   Episodic tension-type headache, not intractable Suspect this is likely a tension headache with possible associated manifestation of neuropathy-like symptoms .  No other etiology could be identified on physical exam.  Labs obtained to rule out other etiologies which were all WNL including CBC, CMP, CBG, and B12. HIV pending.  Some concern for development of worsening dementia as he appeared periodically confused and I had to repeat myself many times throughout exam.  CT head in 2020 notable for age-related volume loss, otherwise unremarkable.  Appears PCP has attempted to schedule patient with geriatric clinic but he has refused.  I also believe he would benefit from further evaluation.  Will defer to PCP.  ANEMIA-IRON DEFICIENCY  History of normocytic anemia with Hgb stable at 11.3 (baseline appears to be 11-12).  Appears patient was on iron supplement in the past but this is no longer on his medication list.  Can consider anemia panel to reevaluate.  Will defer to PCP.  Orders Placed This Encounter  Procedures  .  Comprehensive metabolic panel  . CBC  . RPR  . Vitamin B12  . HIV antibody (with reflex)  . TSH   Mina Marble, DO PGY-2, [facility] 12/07/2019 12:39 PM

## 2019-12-07 ENCOUNTER — Emergency Department (HOSPITAL_COMMUNITY)
Admission: EM | Admit: 2019-12-07 | Discharge: 2019-12-07 | Disposition: A | Discharge status: Home / Self Care | Payer: Medicare PPO | Attending: Emergency Medicine | Admitting: Emergency Medicine

## 2019-12-07 ENCOUNTER — Emergency Department (HOSPITAL_COMMUNITY): Payer: Medicare PPO

## 2019-12-07 ENCOUNTER — Other Ambulatory Visit: Payer: Self-pay

## 2019-12-07 ENCOUNTER — Encounter (HOSPITAL_COMMUNITY): Payer: Self-pay | Admitting: Emergency Medicine

## 2019-12-07 DIAGNOSIS — Z87891 Personal history of nicotine dependence: Secondary | ICD-10-CM | POA: Diagnosis not present

## 2019-12-07 DIAGNOSIS — R9431 Abnormal electrocardiogram [ECG] [EKG]: Secondary | ICD-10-CM | POA: Insufficient documentation

## 2019-12-07 DIAGNOSIS — Z79899 Other long term (current) drug therapy: Secondary | ICD-10-CM | POA: Insufficient documentation

## 2019-12-07 DIAGNOSIS — R519 Headache, unspecified: Secondary | ICD-10-CM | POA: Insufficient documentation

## 2019-12-07 DIAGNOSIS — Z7984 Long term (current) use of oral hypoglycemic drugs: Secondary | ICD-10-CM | POA: Diagnosis not present

## 2019-12-07 DIAGNOSIS — E119 Type 2 diabetes mellitus without complications: Secondary | ICD-10-CM | POA: Diagnosis not present

## 2019-12-07 DIAGNOSIS — G44219 Episodic tension-type headache, not intractable: Secondary | ICD-10-CM | POA: Insufficient documentation

## 2019-12-07 DIAGNOSIS — I10 Essential (primary) hypertension: Secondary | ICD-10-CM | POA: Insufficient documentation

## 2019-12-07 MED ORDER — ACETAMINOPHEN 500 MG PO TABS
1000.0000 mg | ORAL_TABLET | Freq: Once | ORAL | Status: DC
  Filled 2019-12-07: qty 2

## 2019-12-07 NOTE — ED Provider Notes (Signed)
Medical screening examination/treatment/procedure(s) were conducted as a shared visit with non-physician practitioner(s) and myself.  I personally evaluated the patient during the encounter.    Patient has significant difficulty describing his symptoms.  He reports it feels "like something is running around in his head.".  He denies that it is a painful sensation.  He cannot really describe the quality.  He indicates sometimes it is at his temples and then moves around.  He denies that he currently has the symptom.  Sometimes he uses the term pain but other times denies the sensation as painful.  He denies there is any associated visual changes.  He does not endorse photophobia although he is consistently wearing sunglasses.  He does not seem uncomfortable when I asked him to remove the sunglasses.  He points out a small subcutaneous nodule on the right temple.  He then seems to indicate that from there he gets a sensation around different parts of his head.  I did note earwax bilaterally on exam but patient denies he has any ear pain or that that bothers him at all.  He denies any nausea or vomiting.  He cannot seem to identify how long this problem has existed.  Patient is alert and nontoxic.  No appearance of acute distress.  Lamination of face and scalp does not reveal any suspicious lesions or areas of focal tenderness.  No tenderness along the temples.  Bilateral TMs have cerumen impaction but the ear canals and pinna are normal without lesions or swelling.  Neck is supple.  Patient denies any neck pain with range of motion.  No anterior soft tissue swellings.  Normal range of motion of jaw.  Airway widely patent.  Pupils are symmetric about 3 mm and extraocular motions are intact.  Movements are coordinated purposeful symmetric.  No focal motor deficits.  Patient is a challenging historian.  He is alert and in no distress.  He is slightly frustrated by the amount of questions asked to determine  quality, severity and duration of symptoms.  Given patient's age and possible communication limitations, will proceed with a screening CT head.  No acute findings on exam to suggest infectious etiology or temporal arteritis.  If CT head unremarkable, will suggest some symptomatic treatment with Tylenol and outpatient use of Cerumenex with close follow-up with PCP.   Charlesetta Shanks, MD 12/07/19 [phone]

## 2019-12-07 NOTE — ED Provider Notes (Signed)
  Physical Exam  BP (!) 171/79   Pulse (!) 54   Temp 99 F (37.2 C) (Oral)   Resp 18   Ht 6\' 2"  (1.88 m)   Wt 73.5 kg   SpO2 94%   BMI 20.80 kg/m   Physical Exam  Agree with physical exam completed by prior team. Please see their note for full exam.   ED Course/Procedures     Procedures  CT Head IMPRESSION: 1. No CT evidence for acute intracranial abnormality. 2. Atrophy.  MDM  Medical Decision Making: Care of patient assumed from off going team at 3:37 PM. Agree with history, physical exam and plan. See their note for further details. Briefly, 79 y.o. male:  Pt p/w Headache/scalp paraesthesias. - Endorses gradual worsening of pain. Unilateral, radiates temple-to-palpation. Not TTP.  - history limited - Seen PCP yesterday w labs wo significant findings.  - No focal findings on neuro exam - Changes noted on EKG, no CP today. Previous provider has discussed findings with patient who will follow up with PCP as outpatient.   Current plan is as follows:  - F/u CTH to r/o intracranial process  Anticipate discharge w follow up to PCP, ambulatory referral placed for outpatient neurology  On reassessment pt afebrile, HDS, NAD. Pt CTH reviewed and not significant for any acute intracranial abnormality. Pt updated on study results. Discussed plan for follow up with PCP as well as outpatient neurology (ambulatory referral placed). Patient in agreement with plan. Discharged home in stable condition.   Vitals:   12/07/19 1003 12/07/19 1415 12/07/19 1820  BP: (!) 154/72 (!) 171/79 (!) 168/88  Pulse: (!) 105 (!) 54   Resp: 16 18   Temp: 99 F (37.2 C)  98.8 F (37.1 C)  TempSrc: Oral    SpO2: 99% 94%   Weight: 73.5 kg    Height: 6\' 2"  (1.88 m)            Kennyth Lose, MD 12/07/19 2250    Maudie Flakes, MD 12/08/19 0000

## 2019-12-07 NOTE — ED Triage Notes (Signed)
Pt. Stated, ive had a knot in the temple for over a month.

## 2019-12-07 NOTE — ED Notes (Signed)
Pt returned from CT °

## 2019-12-07 NOTE — Discharge Instructions (Addendum)
Please follow-up with your PCP or neurology for reevaluation of the discomfort you are feeling.  You can take Tylenol every 6 hours as needed for these symptoms.  Do not exceed 4000 mg of Tylenol daily.  Your EKG was a little bit abnormal.  Please follow-up with your PCP or cardiologist for reevaluation.  Return to the emergency department if any concerning signs or symptoms develop such as severe headaches, vision changes, loss of consciousness, fevers, neck stiffness, chest pain, shortness of breath.

## 2019-12-07 NOTE — ED Provider Notes (Signed)
[facility] EMERGENCY DEPARTMENT Provider Note   CSN: [id number] Arrival date & time: 12/07/19  1001     History Chief Complaint  Patient presents with  . Headache    [name] is a 79 y.o. male with history of B12 deficiency, BPH, diabetes, hypertension, hyperlipidemia, thyroid disorder presenting for evaluation of acute onset, persistent scalp discomfort.  He tells me that his symptoms have been ongoing for "a while" but cannot tell me exactly how long his symptoms have been present.  He does tell me that the symptoms have been worsening lately which prompted him to present to the ED.  He is a poor historian overall.  He describes a discomfort similar to a pins-and-needles sensation that at times is left-sided and radiates to the right temple and vice versa.  Denies vision changes, nausea, vomiting, fevers, neck stiffness, numbness or tingling of the extremities.  He was seen by his PCP for the symptoms yesterday and lab work was drawn.  The recommendation was for follow-up with PCP within 1 week.  He has not tried anything for his symptoms and states "I am not sure what I could have tried for it".  Denies recent injury, chest pain, shortness of breath.  The history is provided by the patient.       Past Medical History:  Diagnosis Date  . Allergic rhinitis   . Anemia   . Angio-edema   . B12 deficiency   . BPH (benign prostatic hyperplasia)    [place]  . Diabetes ([place])   . Gout   . Hyperlipidemia   . Hypertension   . Mallory-Weiss tear   . Thyroid disorder   . 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 Urticaria     Patient Active Problem List   Diagnosis Date Noted  . Episodic tension-type headache, not intractable 12/07/2019  . Swelling around both eyes 07/18/2019  . Weight loss 02/06/2019  . B12 deficiency 07/21/2017  . Osteoarthritis 05/12/2017  . Hyperlipidemia 12/20/2016  . Memory loss 07/06/2016  . Insomnia 01/09/2015  . Diabetes mellitus  ([place]) 06/05/2014  . 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 ANEMIA-IRON DEFICIENCY 07/07/2009  . Essential hypertension 04/21/2007  . Chronic urticaria 04/21/2007    Past Surgical History:  Procedure Laterality Date  . ADENOIDECTOMY    . CHOLECYSTECTOMY  1960  . COLON SURGERY     right hemi-colectomy  . COLONOSCOPY  multiple  . ESOPHAGOGASTRODUODENOSCOPY    . TONSILLECTOMY         Family History  Problem Relation Age of Onset  . Diabetes Other        nephew  . Colon cancer Neg Hx     Social History   Tobacco Use  . Smoking status: Former Smoker    Packs/day: 1.00    Years: 30.00    Pack years: 30.00    Types: Cigarettes    Quit date: 06/07/2001    Years since quitting: 18.5  . Smokeless tobacco: Former Systems developer    Types: Chew    Quit date: 06/07/2001  Vaping Use  . Vaping Use: Never used  Substance Use Topics  . Alcohol use: No    Alcohol/week: 0.0 standard drinks    Comment: quit about 10 years ago   . Drug use: No    Home Medications Prior to Admission medications   Medication Sig Start Date End Date Taking? Authorizing Provider  doxazosin (CARDURA) 8 MG tablet Take 8 mg by mouth daily. 10/25/19  Yes [provider]  famotidine (PEPCID) 20 MG tablet TAKE 1 TABLET BY MOUTH TWICE A DAY Patient taking differently: Take 20 mg by mouth 2 (two) times daily.  10/11/19  Yes Padgett, Rae Halsted, MD  finasteride (PROSCAR) 5 MG tablet Take 5 mg by mouth daily. 10/01/17  Yes [provider]  Blood Glucose Monitoring Suppl (ONETOUCH VERIO) w/Device KIT 1 Device by Does not apply route as needed. 08/09/18   Mullis, Kiersten P, DO  cetirizine (ZYRTEC) 10 MG tablet Take 1 tablet (10 mg total) by mouth 2 (two) times daily. 05/01/19   Kennith Gain, MD  fluticasone (FLONASE) 50 MCG/ACT nasal spray Place 2 sprays into both nostrils daily. 07/18/19   Doristine Mango L, DO  glucose blood (ONETOUCH VERIO) test strip Use as instructed 08/09/18   Mullis,  Kiersten P, DO  hydrochlorothiazide (HYDRODIURIL) 25 MG tablet TAKE 1 TABLET BY MOUTH EVERY DAY 11/12/19   Leeanne Rio, MD  Lancet Devices (ONE TOUCH DELICA LANCING DEV) MISC Use to test blood sugar once a day. Dx code: E11.9 08/09/18   Leeanne Rio, MD  [facility] DELICA LANCETS 99B MISC Use to test blood sugar once a day. Dx code: E11.9 08/09/18   Leeanne Rio, MD  pravastatin (PRAVACHOL) 80 MG tablet TAKE 1 TABLET BY MOUTH EVERY DAY 08/06/19   Doristine Mango L, DO  vitamin B-12 (CYANOCOBALAMIN) 1000 MCG tablet Take 1 tablet (1,000 mcg total) by mouth daily. 10/07/17   Mikell, Jeani Sow, MD    Allergies    Ace inhibitors and Valsartan  Review of Systems   Review of Systems  Constitutional: Negative for chills and fever.  Respiratory: Negative for shortness of breath.   Cardiovascular: Negative for chest pain.  Gastrointestinal: Negative for abdominal pain, nausea and vomiting.  Neurological: Positive for headaches. Negative for syncope, weakness, light-headedness and numbness.  All other systems reviewed and are negative.   Physical Exam Updated Vital Signs BP (!) 171/79   Pulse (!) 54   Temp 99 F (37.2 C) (Oral)   Resp 18   Ht '6\' 2"'$  (1.88 m)   Wt 73.5 kg   SpO2 94%   BMI 20.80 kg/m   Physical Exam Vitals and nursing note reviewed.  Constitutional:      General: He is not in acute distress.    Appearance: He is well-developed.  HENT:     Head: Normocephalic and atraumatic.     Comments: No Battle's signs, no raccoon's eyes, no rhinorrhea.  No tenderness to palpation of the scalp. No erythema or induration  Eyes:     General:        Right eye: No discharge.        Left eye: No discharge.     Extraocular Movements: Extraocular movements intact.     Conjunctiva/sclera: Conjunctivae normal.     Pupils: Pupils are equal, round, and reactive to light.  Neck:     Vascular: No JVD.     Trachea: No tracheal deviation.  Cardiovascular:     Rate and  Rhythm: Normal rate.  Pulmonary:     Effort: Pulmonary effort is normal.     Breath sounds: Normal breath sounds.  Abdominal:     General: There is no distension.     Palpations: Abdomen is soft.  Skin:    General: Skin is warm.     Findings: No erythema.  Neurological:     Mental Status: He is alert and oriented to person, place,  and time.     GCS: GCS eye subscore is 4.     Cranial Nerves: No cranial nerve deficit.     Comments: Mental Status:  Alert, thought content appropriate, able to give a coherent history. Speech fluent without evidence of aphasia. Able to follow 2 step commands without difficulty.  Cranial Nerves:  II:  Peripheral visual fields grossly normal, pupils equal, round, reactive to light III,IV, VI: ptosis not present, extra-ocular motions intact bilaterally  V,VII: smile symmetric, facial light touch sensation equal VIII: hearing grossly normal to voice  X: uvula elevates symmetrically  XI: bilateral shoulder shrug symmetric and strong XII: midline tongue extension without fassiculations Motor:  Normal tone. 5/5 strength of BUE and BLE major muscle groups including strong and equal grip strength and dorsiflexion/plantar flexion Sensory: light touch normal in all extremities.   Psychiatric:        Behavior: Behavior normal.     ED Results / Procedures / Treatments   Labs (all labs ordered are listed, but only abnormal results are displayed) Labs Reviewed - No data to display  EKG EKG Interpretation  Date/Time:  Friday December 07 2019 12:53:11 EDT Ventricular Rate:  59 PR Interval:    QRS Duration: 127 QT Interval:  473 QTC Calculation: 469 R Axis:   -61 Text Interpretation: Atrial fibrillation Nonspecific IVCD with LAD Abnormal T, consider ischemia, diffuse leads agree. diffuse t wave changes as compared to 2011 comparison Confirmed by Charlesetta Shanks [phone]) on 12/07/2019 3:02:21 PM   Radiology No results found.  Procedures Procedures (including  critical care time)  Medications Ordered in ED Medications  acetaminophen (TYLENOL) tablet 1,000 mg (1,000 mg Oral Not Given 12/07/19 1458)    ED Course  I have reviewed the triage vital signs and the nursing notes.  Pertinent labs & imaging results that were available during my care of the patient were reviewed by me and considered in my medical decision making (see chart for details).    MDM Rules/Calculators/A&P                          Patient presenting for evaluation of what appears to be scalp discomfort.  He is a poor historian and has difficulty describing his symptoms but his symptoms have been persistent and progressively worsening.  He reports that they have gotten to a point that he wanted to seek further evaluation in the ED.  He is afebrile, mildly hypertensive in the ED.  He is nontoxic in appearance.  No fever or neck stiffness to suggest meningitis.  The pain on my assessment is not reproducible on palpation though I did consider acceptable neuralgia as the cause of symptoms.  Neurologic examination normal with no focal deficits.  He had lab work done yesterday by his PCP which is reassuring.  Will give Tylenol for headache.  Patient was seen and evaluated by Dr. Johnney Killian who recommended obtaining CT scan to rule out mass or other abnormality that could potentially explain his symptoms.  An EKG was obtained to assess QT interval in the event we were to give him a migraine cocktail; EKG shows nonspecific T wave inversions.  Most recent EKG on file is from 2011.  His EKG does look different however the patient has no complaint of chest pain and his presentation today is inconsistent with cardiac disease.  I informed him of his abnormal EKG and he will plan to follow-up with his PCP for reevaluation of this.  4:00 PM Care signed out to oncoming provider Dr. Alric Ran.  Plan to follow-up on head CT.  If this shows no concerning findings and the patient will be stable for discharge home  with outpatient follow-up with neurology.  I discussed this with the patient and he is agreeable to tentative plan and patient will likely be stable for discharge home pending reassuring imaging.  Final Clinical Impression(s) / ED Diagnoses Final diagnoses:  Nonintractable episodic headache, unspecified headache type  Abnormal EKG    Rx / DC Orders ED Discharge Orders         Ordered    Ambulatory referral to Neurology     Discontinue  Reprint    Comments: An appointment is requested in approximately: 4 weeks   12/07/19 1517           Renita Papa, PA-C 12/07/19 1613    Charlesetta Shanks, MD 12/10/19 2005

## 2019-12-07 NOTE — Assessment & Plan Note (Addendum)
History of normocytic anemia with Hgb stable at 11.3 (baseline appears to be 11-12).  Appears patient was on iron supplement in the past but this is no longer on his medication list.  Can consider anemia panel to reevaluate.  Will defer to PCP.

## 2019-12-07 NOTE — ED Notes (Signed)
Patient Alert and oriented to baseline. Stable and ambulatory to baseline. Patient verbalized understanding of the discharge instructions.  Patient belongings were taken by the patient.   

## 2019-12-07 NOTE — Assessment & Plan Note (Addendum)
Suspect this is likely a tension headache with possible associated manifestation of neuropathy-like symptoms .  No other etiology could be identified on physical exam.  Labs obtained to rule out other etiologies which were all WNL including CBC, CMP, CBG, and B12. HIV pending.  Some concern for development of worsening dementia as he appeared periodically confused and I had to repeat myself many times throughout exam.  CT head in 2020 notable for age-related volume loss, otherwise unremarkable.  Appears PCP has attempted to schedule patient with geriatric clinic but he has refused.  I also believe he would benefit from further evaluation.  Will defer to PCP.

## 2019-12-10 LAB — CBC
Hematocrit: 32.9 % — ABNORMAL LOW (ref 37.5–51.0)
Hemoglobin: 11.3 g/dL — ABNORMAL LOW (ref 13.0–17.7)
MCH: 29.7 pg (ref 26.6–33.0)
MCHC: 34.3 g/dL (ref 31.5–35.7)
MCV: 86 fL (ref 79–97)
Platelets: 207 10*3/uL (ref 150–450)
RBC: 3.81 x10E6/uL — ABNORMAL LOW (ref 4.14–5.80)
RDW: 12.1 % (ref 11.6–15.4)
WBC: 3.6 10*3/uL (ref 3.4–10.8)

## 2019-12-10 LAB — COMPREHENSIVE METABOLIC PANEL
ALT: 9 IU/L (ref 0–44)
AST: 14 IU/L (ref 0–40)
Albumin/Globulin Ratio: 1.3 (ref 1.2–2.2)
Albumin: 4 g/dL (ref 3.7–4.7)
Alkaline Phosphatase: 56 IU/L (ref 48–121)
BUN/Creatinine Ratio: 7 — ABNORMAL LOW (ref 10–24)
BUN: 9 mg/dL (ref 8–27)
Bilirubin Total: 0.4 mg/dL (ref 0.0–1.2)
CO2: 22 mmol/L (ref 20–29)
Calcium: 9.7 mg/dL (ref 8.6–10.2)
Chloride: 108 mmol/L — ABNORMAL HIGH (ref 96–106)
Creatinine, Ser: 1.24 mg/dL (ref 0.76–1.27)
GFR calc Af Amer: 63 mL/min/{1.73_m2} (ref >59)
GFR calc non Af Amer: 55 mL/min/{1.73_m2} — ABNORMAL LOW (ref >59)
Globulin, Total: 3.1 g/dL (ref 1.5–4.5)
Glucose: 164 mg/dL — ABNORMAL HIGH (ref 65–99)
Potassium: 3.8 mmol/L (ref 3.5–5.2)
Sodium: 141 mmol/L (ref 134–144)
Total Protein: 7.1 g/dL (ref 6.0–8.5)

## 2019-12-10 LAB — RNA QUALITATIVE: HIV 1 RNA Qualitative: NEGATIVE

## 2019-12-10 LAB — HIV 1/2 AB DIFFERENTIATION
HIV 1 Ab: NEGATIVE
HIV 2 Ab: NEGATIVE
NOTE (HIV CONF MULTIP: NEGATIVE

## 2019-12-10 LAB — VITAMIN B12: Vitamin B-12: 244 pg/mL (ref 232–1245)

## 2019-12-10 LAB — RPR: RPR Ser Ql: NONREACTIVE

## 2019-12-10 LAB — HIV ANTIBODY (ROUTINE TESTING W REFLEX): HIV Screen 4th Generation wRfx: REACTIVE — AB

## 2019-12-14 ENCOUNTER — Telehealth: Payer: Self-pay | Admitting: Student in an Organized Health Care Education/Training Program

## 2019-12-14 NOTE — Telephone Encounter (Signed)
Patient came into the office to let Dr. Ouida Sills know that he was seen in the ED and they diagnosed him with Anemia.

## 2020-02-17 NOTE — Progress Notes (Signed)
[facility]    Provider:  Dr Jaynee Eagles Requesting Provider: Rodell Perna PA (ED) Primary Care Provider:  Richarda Osmond, DO  CC:  Follow up from Emergency Room for headache now resolved (Patient came at 1130, his appointment was 9am, we were able to see him)  HPI:  [name] is a 79 y.o. male here as requested by Rodell Perna PA for headaches. PMHx thyroid disorder, Mallory-Weiss tear, hypertension, hyperlipidemia, gout, diabetes, B12 deficiency, anemia, episodic tension type headache, insomnia, memory loss.  Patient was referred after emergency room visit December 07, 2019, records were reviewed: He presented on December 07, 2019 with "a knot in the temple for over a month", he was evaluated by physician and family medicine who suspected it was likely a tension type headache with associated manifestations of neuropathy-like symptoms, labs were ordered to rule out other etiologies which were within normal limits including CBC, CMP, CBG, B12, HIV, there was some concern for development of worsening dementia as he appeared periodically confused, CT had in 2020 notable for age-related volume loss otherwise unremarkable, appears PCP had attempted to schedule patient with a geriatric clinic but he refused, CBC did show normocytic anemia with hemoglobin stable at 11.3 which was chronic, symptoms have been going on for "a while", poor historian overall, also described a discomfort similar to pins-and-needles sensation left side which radiates to the right temple and vice versa.  Physical exam showed normal head, eyes, ears, nose, throat, neck, cardiovascular, pulmonary, skin, cranial nerves were intact, normal tone and strength and sensation.  He was nontoxic-appearing without fever or neck stiffness and he actually denied pain or associated visual changes.  They recommended Tylenol.  CT scan was unremarkable.  He is here today at 1130am, his appointment was 9am but we were able to see him. He  was seen in the ED for some vague headache symptoms and he stated it was not painful and had not tried anything, CT was non acute, they recommended tylenol. He says his head does not hurt, everything is ok now, all the symptoms are resolved. He denies any problems, no memory loss, he thinks the hospital must have exaggerated about his memory, no memory problems, he denies any issues. He says he stayed overnight, he is a very poor historian, he said 'she needs to go back to school", he lives alone, not getting lost, no problems driving, no accidents, he doesn't remember like he used to but he denies any significant memory loss, he denies any vision changes, no jaw pain, no tingling or numbness in the face, no weakness. No other focal neurologic deficits, associated symptoms, inciting events or modifiable factors.  Reviewed notes, labs and imaging from outside physicians, which showed:   CT head 12/07/2019: pesonally reviewed images and agree and would add their is chronic microvascular ischemic changes.  Brain: No acute territorial infarction, hemorrhage, or intracranial mass. Mild atrophy. Stable ventricle size.  Vascular: No hyperdense vessels. Scattered carotid vascular calcification  Skull: Normal. Negative for fracture or focal lesion.  Sinuses/Orbits: Mild mucosal thickening in the ethmoid sinuses.  Other: None  IMPRESSION: 1. No CT evidence for acute intracranial abnormality. 2. Atrophy.  hiv1 and hiv2 ab neg(reflex), rpr NR, b12, hgba1c 6.9, B12 244 (recheck)  Review of Systems: Patient complains of symptoms per HPI as well as the following symptoms: denies any ROS. Pertinent negatives and positives per HPI. All others negative.   Social History   Socioeconomic History  . Marital status: Divorced  Spouse name: Not on file  . Number of children: 3  . Years of education: 97  . Highest education level: High school graduate  Occupational History  . Occupation: retired     Comment: Administrator   Tobacco Use  . Smoking status: Former Smoker    Packs/day: 1.00    Years: 30.00    Pack years: 30.00    Types: Cigarettes    Quit date: 06/07/2001    Years since quitting: 18.7  . Smokeless tobacco: Former Systems developer    Types: Chew    Quit date: 06/07/2001  Vaping Use  . Vaping Use: Never used  Substance and Sexual Activity  . Alcohol use: No    Alcohol/week: 0.0 standard drinks    Comment: quit about 10 years ago   . Drug use: No  . Sexual activity: Yes    Partners: Female  Other Topics Concern  . Not on file  Social History Narrative   Lives in [place] alone   [place]: hangs out socially    Patient enjoys walking around the community to stay active.      Right handed   Caffeine: none    Social Determinants of Health   Financial Resource Strain:   . Difficulty of Paying Living Expenses: Not on file  Food Insecurity:   . Worried About Charity fundraiser in the Last Year: Not on file  . Ran Out of Food in the Last Year: Not on file  Transportation Needs:   . Lack of Transportation (Medical): Not on file  . Lack of Transportation (Non-Medical): Not on file  Physical Activity:   . Days of Exercise per Week: Not on file  . Minutes of Exercise per Session: Not on file  Stress:   . Feeling of Stress : Not on file  Social Connections:   . Frequency of Communication with Friends and Family: Not on file  . Frequency of Social Gatherings with Friends and Family: Not on file  . Attends Religious Services: Not on file  . Active Member of Clubs or Organizations: Not on file  . Attends Archivist Meetings: Not on file  . Marital Status: Not on file  Intimate Partner Violence:   . Fear of Current or Ex-Partner: Not on file  . Emotionally Abused: Not on file  . Physically Abused: Not on file  . Sexually Abused: Not on file    Family History  Problem Relation Age of Onset  . Diabetes Other        nephew  . Colon cancer Neg Hx     Past  Medical History:  Diagnosis Date  . Allergic rhinitis   . Anemia   . Angio-edema   . B12 deficiency   . BPH (benign prostatic hyperplasia)    [place]  . Diabetes ([place])   . Gout   . Hyperlipidemia   . Hypertension   . Mallory-Weiss tear   . Thyroid disorder   . 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 Urticaria     Patient Active Problem List   Diagnosis Date Noted  . Episodic tension-type headache, not intractable 12/07/2019  . Swelling around both eyes 07/18/2019  . Weight loss 02/06/2019  . B12 deficiency 07/21/2017  . Osteoarthritis 05/12/2017  . Hyperlipidemia 12/20/2016  . Memory loss 07/06/2016  . Insomnia 01/09/2015  . Diabetes mellitus ([place]) 06/05/2014  . 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 ANEMIA-IRON DEFICIENCY 07/07/2009  . Essential hypertension 04/21/2007  . Chronic  urticaria 04/21/2007    Past Surgical History:  Procedure Laterality Date  . ADENOIDECTOMY    . CHOLECYSTECTOMY  1960  . COLON SURGERY     right hemi-colectomy  . COLONOSCOPY  multiple  . ESOPHAGOGASTRODUODENOSCOPY    . TONSILLECTOMY      Current Outpatient Medications  Medication Sig Dispense Refill  . Blood Glucose Monitoring Suppl (ONETOUCH VERIO) w/Device KIT 1 Device by Does not apply route as needed. 1 kit 0  . cetirizine (ZYRTEC) 10 MG tablet Take 1 tablet (10 mg total) by mouth 2 (two) times daily. 180 tablet 2  . doxazosin (CARDURA) 8 MG tablet Take 8 mg by mouth daily.    . famotidine (PEPCID) 20 MG tablet TAKE 1 TABLET BY MOUTH TWICE A DAY (Patient taking differently: Take 20 mg by mouth 2 (two) times daily. ) 180 tablet 1  . finasteride (PROSCAR) 5 MG tablet Take 5 mg by mouth daily.  11  . fluticasone (FLONASE) 50 MCG/ACT nasal spray Place 2 sprays into both nostrils daily. 16 g 6  . glucose blood (ONETOUCH VERIO) test strip Use as instructed 100 each 12  . hydrochlorothiazide (HYDRODIURIL) 25 MG tablet TAKE 1 TABLET BY MOUTH EVERY DAY (Patient taking differently:  Take 25 mg by mouth daily. ) 90 tablet 0  . Lancet Devices (ONE TOUCH DELICA LANCING DEV) MISC Use to test blood sugar once a day. Dx code: E11.9 1 each 0  . ONETOUCH DELICA LANCETS 01U MISC Use to test blood sugar once a day. Dx code: E11.9 100 each 12  . pravastatin (PRAVACHOL) 80 MG tablet TAKE 1 TABLET BY MOUTH EVERY DAY (Patient taking differently: Take 80 mg by mouth daily. ) 90 tablet 0  . vitamin B-12 (CYANOCOBALAMIN) 1000 MCG tablet Take 1 tablet (1,000 mcg total) by mouth daily. 30 tablet 4   No current facility-administered medications for this visit.    Allergies as of 02/18/2020 - Review Complete 02/18/2020  Allergen Reaction Noted  . Ace inhibitors Swelling and Other (See Comments) 04/13/2017  . Valsartan Swelling and Other (See Comments) 04/13/2017    Vitals: BP (!) 158/78 (BP Location: Right Arm, Patient Position: Sitting)   Pulse 93   Ht _0  (1.88 m)   Wt 157 lb (71.2 kg)   BMI 20.16 kg/m  Last Weight:  Wt Readings from Last 1 Encounters:  02/18/20 157 lb (71.2 kg)   Last Height:   Ht Readings from Last 1 Encounters:  02/18/20 _1  (1.88 m)     Physical exam: Exam: Gen: NAD, thin                     CV: RRR, no MRG. No Carotid Bruits. No peripheral edema, warm, nontender Eyes: Conjunctivae clear without exudates or hemorrhage Head: no pain on palpation of the temples, some raised papules under the skin may be veins or maybe something in the dermis but soft,compressible.  Neuro: Detailed Neurologic Exam  Speech:    Speech is normal; fluent with impaired comprehension.  Cognition:    The patient is oriented to person, place    recent and remote memory impaired;     language fluent;     Impaired attention, concentration,     fund of knowledge Cranial Nerves:    The pupils are equal, round, and reactive to light. Attempted, could not visualize fundi due to small pupils. Visual fields are full to finger confrontation. Extraocular movements are intact.  Trigeminal sensation is intact and  the muscles of mastication are normal. The face is symmetric. The palate elevates in the midline. Hearing intact to voice. Voice is normal. Shoulder shrug is normal. The tongue has normal motion without fasciculations.   Coordination:    No dysmetria or ataxia noted  Gait:    Uses a cane, slightly narrow and decreased clearance  Motor Observation:    No asymmetry, no atrophy, and no involuntary movements noted. Tone:    No cogwheeling.    Posture:    Posture is normal.    Strength:    Strength is intact in the upper and lower limbs, no focal weakness     Sensation: intact to LT     Reflex Exam:  DTR's: Ajs 1+, biceps 2+, patellars 2+ right trace left (reports surgical)   Toes:    The toes are equivocal bilaterally.   Clonus:    Clonus is absent.    Assessment/Plan:  [name] is a 79 y.o. male here as requested by Rodell Perna PA for headaches from the ED. PMHx thyroid disorder, Mallory-Weiss tear, hypertension, hyperlipidemia, gout, diabetes, B12 deficiency, anemia, episodic tension type headache, insomnia, memory loss.  - He is here today at 1130am, his appointment was 9am but we were able to see him. He was seen in the ED for some vague headache symptoms, going on "a while", poor historian overall as per the ED and also poor historian here today as well, ED recommended Tylenol.  CT scan showed nothing acute.He reports the symptoms he was seen in the ED for are resolved.   - very poor historian, appeared confused in the ED, also showed up wrong time and dates for his appointment in our office, I question dementia and tried to approach memory loss but he denies any significant memory loss and denies any workup for memory changes. He has a hx of B12 deficiency will get some blood work on that today.He agreed I can call his sister, she says he has lost memory but she is not around him a lot, I expressed my concerns for dementia and asked her to  follow up with her brother and that we are here if she wants to come back with him for a memory/dementia evaluation.  His  CT showed chronic microvascular changes discussed an MRi of the brain may see the extent better and we may consider vascular causes. I recommend he come with family for a follow up on dementia, sister says she will call us after she discusses with her brother and see [name] if needed.   - follow up with primary care   Orders Placed This Encounter  Procedures  . B12 and Folate Panel  . Methylmalonic acid, serum  . Vitamin B1  . Homocysteine   To prevent or relieve headaches, try the following: Cool Compress. Lie down and place a cool compress on your head.  Avoid headache triggers. If certain foods or odors seem to have triggered your migraines in the past, avoid them. A headache diary might help you identify triggers.  Include physical activity in your daily routine. Try a daily walk or other moderate aerobic exercise.  Manage stress. Find healthy ways to cope with the stressors, such as delegating tasks on your to-do list.   Eat regularly. Eating regularly scheduled meals and maintaining a healthy diet might help prevent headaches. Also, drink plenty of fluids.  Follow a regular sleep schedule. Sleep deprivation might contribute to headaches   Proceed to emergency room if you experience  new or worsening symptoms or symptoms do not resolve, if you have new neurologic symptoms or if headache is severe, or for any concerning symptom.    Cc:  Richarda Osmond, DO  Sarina Ill, MD  [facility] [address] [place], [place] [address]  Phone [phone] Fax [phone]

## 2020-02-18 ENCOUNTER — Telehealth: Payer: Self-pay | Admitting: Neurology

## 2020-02-18 ENCOUNTER — Encounter: Payer: Self-pay | Admitting: Neurology

## 2020-02-18 ENCOUNTER — Other Ambulatory Visit: Payer: Self-pay

## 2020-02-18 ENCOUNTER — Ambulatory Visit: Payer: Medicare PPO | Admitting: Neurology

## 2020-02-18 VITALS — BP 158/78 | HR 93 | Ht 74.0 in | Wt 157.0 lb

## 2020-02-18 DIAGNOSIS — R519 Headache, unspecified: Secondary | ICD-10-CM | POA: Diagnosis not present

## 2020-02-18 DIAGNOSIS — R413 Other amnesia: Secondary | ICD-10-CM | POA: Diagnosis not present

## 2020-02-18 NOTE — Patient Instructions (Addendum)
Blood work today General Headache Without Cause A headache is pain or discomfort that is felt around the head or neck area. There are many causes and types of headaches. In some cases, the cause may not be found. Follow these instructions at home: Watch your condition for any changes. Let your doctor know about them. Take these steps to help with your condition: Managing pain      Take over-the-counter and prescription medicines only as told by your doctor.  Lie down in a dark, quiet room when you have a headache.  If told, put ice on your head and neck area: ? Put ice in a plastic bag. ? Place a towel between your skin and the bag. ? Leave the ice on for 20 minutes, 2-3 times per day.  If told, put heat on the affected area. Use the heat source that your doctor recommends, such as a moist heat pack or a heating pad. ? Place a towel between your skin and the heat source. ? Leave the heat on for 20-30 minutes. ? Remove the heat if your skin turns bright red. This is very important if you are unable to feel pain, heat, or cold. You may have a greater risk of getting burned.  Keep lights dim if bright lights bother you or make your headaches worse. Eating and drinking  Eat meals on a regular schedule.  If you drink alcohol: ? Limit how much you use to:  0-1 drink a day for women.  0-2 drinks a day for men. ? Be aware of how much alcohol is in your drink. In the [place] one drink equals one 12 oz bottle of beer (355 mL), one 5 oz glass of wine (148 mL), or one 1 oz glass of hard liquor (44 mL).  Stop drinking caffeine, or reduce how much caffeine you drink. General instructions   Keep a journal to find out if certain things bring on headaches. For example, write down: ? What you eat and drink. ? How much sleep you get. ? Any change to your diet or medicines.  Get a massage or try other ways to relax.  Limit stress.  Sit up straight. Do not tighten (tense) your  muscles.  Do not use any products that contain nicotine or tobacco. This includes cigarettes, e-cigarettes, and chewing tobacco. If you need help quitting, ask your doctor.  Exercise regularly as told by your doctor.  Get enough sleep. This often means 7-9 hours of sleep each night.  Keep all follow-up visits as told by your doctor. This is important. Contact a doctor if:  Your symptoms are not helped by medicine.  You have a headache that feels different than the other headaches.  You feel sick to your stomach (nauseous) or you throw up (vomit).  You have a fever. Get help right away if:  Your headache gets very bad quickly.  Your headache gets worse after a lot of physical activity.  You keep throwing up.  You have a stiff neck.  You have trouble seeing.  You have trouble speaking.  You have pain in the eye or ear.  Your muscles are weak or you lose muscle control.  You lose your balance or have trouble walking.  You feel like you will pass out (faint) or you pass out.  You are mixed up (confused).  You have a seizure. Summary  A headache is pain or discomfort that is felt around the head or neck area.  There are  many causes and types of headaches. In some cases, the cause may not be found.  Keep a journal to help find out what causes your headaches. Watch your condition for any changes. Let your doctor know about them.  Contact a doctor if you have a headache that is different from usual, or if your headache is not helped by medicine.  Get help right away if your headache gets very bad, you throw up, you have trouble seeing, you lose your balance, or you have a seizure. This information is not intended to replace advice given to you by your health care provider. Make sure you discuss any questions you have with your health care provider. Document Revised: 12/12/2017 Document Reviewed: 12/12/2017 Elsevier Patient Education  [place].

## 2020-02-18 NOTE — Telephone Encounter (Signed)
Patient was referred her from ED and no-showed appointment today. After reviewing his ED notes doesn't sound like he needed to be referred to neurology/specialist anyway, he can just follow up with pcp about his headache which appears mild and painless per notes, imaging was negative for anything acute in the ED, they suggested Tylenol, if patient calls to reschedule please let me know so I can call him otp first prior to any appointments since he no-showed.  Thanks, Dr. Jaynee Eagles

## 2020-02-22 LAB — METHYLMALONIC ACID, SERUM: Methylmalonic Acid: 74 nmol/L (ref 0–378)

## 2020-02-22 LAB — B12 AND FOLATE PANEL
Folate: 9.7 ng/mL (ref >3.0)
Vitamin B-12: 238 pg/mL (ref 232–1245)

## 2020-02-22 LAB — VITAMIN B1: Thiamine: 73 nmol/L (ref 66.5–200.0)

## 2020-02-22 LAB — HOMOCYSTEINE: Homocysteine: 18.7 umol/L (ref 0.0–19.2)

## 2020-02-26 ENCOUNTER — Telehealth: Payer: Self-pay | Admitting: *Deleted

## 2020-02-26 NOTE — Telephone Encounter (Signed)
-----   Message from Melvenia Beam, MD sent at 02/23/2020  8:14 PM EDT ----- Labs are in the norrmal range but b12 is a little on the low side I would recommend taking a multivitamin daily with B12 in it thanks

## 2020-02-26 NOTE — Telephone Encounter (Signed)
I called the pt and advised his labs are normal except his B12 is a little low. Pt aware that Dr Jaynee Eagles recommends he purchase an OTC multivitamin w/ B12 in it and take this daily. The pt's questions were answered. He was advised that if he goes to the pharmacy they can help him find the correct one. The pt declined for me to call his sister Lesleigh Noe and said he would go to the pharmacy today.

## 2020-03-24 ENCOUNTER — Ambulatory Visit: Payer: Medicare PPO | Admitting: Student in an Organized Health Care Education/Training Program

## 2020-04-09 ENCOUNTER — Other Ambulatory Visit: Payer: Self-pay | Admitting: Allergy

## 2020-04-09 DIAGNOSIS — L508 Other urticaria: Secondary | ICD-10-CM

## 2020-04-17 ENCOUNTER — Telehealth: Payer: Self-pay | Admitting: Student in an Organized Health Care Education/Training Program

## 2020-04-17 NOTE — Telephone Encounter (Signed)
Pt walked in requesting to be referred  to a foot doctor for his foot / pain in toes  Ph# [phone]

## 2020-04-19 ENCOUNTER — Other Ambulatory Visit: Payer: Self-pay | Admitting: Allergy

## 2020-04-19 DIAGNOSIS — L508 Other urticaria: Secondary | ICD-10-CM

## 2020-05-17 DIAGNOSIS — E119 Type 2 diabetes mellitus without complications: Secondary | ICD-10-CM | POA: Diagnosis not present

## 2020-07-29 ENCOUNTER — Encounter (HOSPITAL_COMMUNITY): Payer: Self-pay | Admitting: Emergency Medicine

## 2020-07-29 ENCOUNTER — Emergency Department (HOSPITAL_COMMUNITY)
Admission: EM | Admit: 2020-07-29 | Discharge: 2020-07-29 | Disposition: A | Discharge status: Home / Self Care | Payer: Medicare PPO | Attending: Emergency Medicine | Admitting: Emergency Medicine

## 2020-07-29 ENCOUNTER — Other Ambulatory Visit: Payer: Self-pay

## 2020-07-29 DIAGNOSIS — I10 Essential (primary) hypertension: Secondary | ICD-10-CM | POA: Diagnosis not present

## 2020-07-29 DIAGNOSIS — Z87891 Personal history of nicotine dependence: Secondary | ICD-10-CM | POA: Diagnosis not present

## 2020-07-29 DIAGNOSIS — E119 Type 2 diabetes mellitus without complications: Secondary | ICD-10-CM | POA: Diagnosis not present

## 2020-07-29 DIAGNOSIS — R350 Frequency of micturition: Secondary | ICD-10-CM | POA: Diagnosis present

## 2020-07-29 DIAGNOSIS — N390 Urinary tract infection, site not specified: Secondary | ICD-10-CM | POA: Diagnosis not present

## 2020-07-29 DIAGNOSIS — Z79899 Other long term (current) drug therapy: Secondary | ICD-10-CM | POA: Insufficient documentation

## 2020-07-29 LAB — URINALYSIS, MICROSCOPIC (REFLEX): WBC, UA: >50 WBC/hpf (ref 0–5)

## 2020-07-29 LAB — URINALYSIS, ROUTINE W REFLEX MICROSCOPIC
Bilirubin Urine: NEGATIVE
Glucose, UA: NEGATIVE mg/dL
Ketones, ur: NEGATIVE mg/dL
Nitrite: NEGATIVE
Protein, ur: NEGATIVE mg/dL
Specific Gravity, Urine: 1.01 (ref 1.005–1.030)
pH: 6 (ref 5.0–8.0)

## 2020-07-29 MED ORDER — CEPHALEXIN 500 MG PO CAPS: 500.0000 mg | ORAL_CAPSULE | Freq: Four times a day (QID) | ORAL | 0 refills | Status: AC

## 2020-07-29 MED ORDER — CEFTRIAXONE SODIUM 1 G IJ SOLR
1.0000 g | Freq: Once | INTRAMUSCULAR | Status: AC
  Administered 2020-07-29: 1 g via INTRAMUSCULAR
  Filled 2020-07-29: qty 10

## 2020-07-29 MED ORDER — LIDOCAINE HCL (PF) 1 % IJ SOLN
INTRAMUSCULAR | Status: AC
  Administered 2020-07-29: 2.1 mL
  Filled 2020-07-29: qty 5

## 2020-07-29 NOTE — ED Triage Notes (Signed)
Pt states he has been unable to urinate x 1 week however [facility] staff witnessed him urinating in sink.  Pt left from triage room to go back to bathroom while waiting to be triaged.  Urine cup given to pt for specimen.

## 2020-07-29 NOTE — ED Notes (Signed)
Pt insisting to speak to PA, stating that he cannot urinate, PA at bedside to explain to pt that we are treating a UTI

## 2020-07-29 NOTE — ED Notes (Signed)
Pt standing in hallway asking for bathroom. Pt ambulated to bathroom to urinate.

## 2020-07-29 NOTE — ED Provider Notes (Signed)
[facility] EMERGENCY DEPARTMENT Provider Note   CSN: [id number] Arrival date & time: 07/29/20  1027     History Chief Complaint  Patient presents with  . Urinary Retention    [name] is a 80 y.o. male.  80 year old male presents with complaint of urinary frequency and dysuria for the past few days.  Patient denies abdominal pain, difficulty emptying his bladder, fevers, chills, vomiting.  No other complaints or concerns today.        Past Medical History:  Diagnosis Date  . Allergic rhinitis   . Anemia   . Angio-edema   . B12 deficiency   . BPH (benign prostatic hyperplasia)    Nesi  . Diabetes ([place])   . Gout   . Hyperlipidemia   . Hypertension   . Mallory-Weiss tear   . Thyroid disorder   . 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 Urticaria     Patient Active Problem List   Diagnosis Date Noted  . Episodic tension-type headache, not intractable 12/07/2019  . Swelling around both eyes 07/18/2019  . Weight loss 02/06/2019  . B12 deficiency 07/21/2017  . Osteoarthritis 05/12/2017  . Hyperlipidemia 12/20/2016  . Memory loss 07/06/2016  . Insomnia 01/09/2015  . Diabetes mellitus ([place]) 06/05/2014  . 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 ANEMIA-IRON DEFICIENCY 07/07/2009  . Essential hypertension 04/21/2007  . Chronic urticaria 04/21/2007    Past Surgical History:  Procedure Laterality Date  . ADENOIDECTOMY    . CHOLECYSTECTOMY  1960  . COLON SURGERY     right hemi-colectomy  . COLONOSCOPY  multiple  . ESOPHAGOGASTRODUODENOSCOPY    . TONSILLECTOMY         Family History  Problem Relation Age of Onset  . Diabetes Other        nephew  . Colon cancer Neg Hx     Social History   Tobacco Use  . Smoking status: Former Smoker    Packs/day: 1.00    Years: 30.00    Pack years: 30.00    Types: Cigarettes    Quit date: 06/07/2001    Years since quitting: 19.1  . Smokeless tobacco: Former Systems developer    Types: Chew     Quit date: 06/07/2001  Vaping Use  . Vaping Use: Never used  Substance Use Topics  . Alcohol use: No    Alcohol/week: 0.0 standard drinks    Comment: quit about 10 years ago   . Drug use: No    Home Medications Prior to Admission medications   Medication Sig Start Date End Date Taking? Authorizing Provider  cephALEXin (KEFLEX) 500 MG capsule Take 1 capsule (500 mg total) by mouth 4 (four) times daily for 7 days. 07/29/20 08/05/20 Yes Tacy Learn, PA-C  Blood Glucose Monitoring Suppl (ONETOUCH VERIO) w/Device KIT 1 Device by Does not apply route as needed. 08/09/18   Mullis, Kiersten P, DO  cetirizine (ZYRTEC) 10 MG tablet Take 1 tablet (10 mg total) by mouth 2 (two) times daily. 05/01/19   Kennith Gain, MD  doxazosin (CARDURA) 8 MG tablet Take 8 mg by mouth daily. 10/25/19   [provider]  famotidine (PEPCID) 20 MG tablet TAKE 1 TABLET BY MOUTH TWICE A DAY Patient taking differently: Take 20 mg by mouth daily.  10/11/19   Kennith Gain, MD  finasteride (PROSCAR) 5 MG tablet Take 5 mg by mouth daily. 10/01/17   [provider]  fluticasone (FLONASE) 50 MCG/ACT nasal spray Place 2  sprays into both nostrils daily. 07/18/19   Doristine Mango L, DO  glucose blood (ONETOUCH VERIO) test strip Use as instructed 08/09/18   Mullis, Kiersten P, DO  hydrochlorothiazide (HYDRODIURIL) 25 MG tablet TAKE 1 TABLET BY MOUTH EVERY DAY Patient taking differently: Take 25 mg by mouth daily.  11/12/19   Leeanne Rio, MD  Lancet Devices (ONE TOUCH DELICA LANCING DEV) MISC Use to test blood sugar once a day. Dx code: E11.9 08/09/18   Leeanne Rio, MD  [facility] DELICA LANCETS 97L MISC Use to test blood sugar once a day. Dx code: E11.9 08/09/18   Leeanne Rio, MD  pravastatin (PRAVACHOL) 80 MG tablet TAKE 1 TABLET BY MOUTH EVERY DAY Patient taking differently: Take 80 mg by mouth daily.  08/06/19   Anderson, Chelsey L, DO  vitamin B-12 (CYANOCOBALAMIN) 1000 MCG  tablet Take 1 tablet (1,000 mcg total) by mouth daily. 10/07/17   Mikell, Jeani Sow, MD    Allergies    Ace inhibitors and Valsartan  Review of Systems   Review of Systems  Constitutional: Negative for chills and fever.  Gastrointestinal: Negative for abdominal pain, nausea and vomiting.  Genitourinary: Positive for dysuria and frequency. Negative for decreased urine volume.  Musculoskeletal: Negative for back pain.  Skin: Negative for rash and wound.  Allergic/Immunologic: Positive for immunocompromised state.  Neurological: Negative for weakness.  Psychiatric/Behavioral: Negative for confusion.  All other systems reviewed and are negative.   Physical Exam Updated Vital Signs BP (!) 163/95   Pulse 73   Temp 98.4 F (36.9 C) (Oral)   Resp 15   SpO2 96%   Physical Exam Vitals and nursing note reviewed.  Constitutional:      General: He is not in acute distress.    Appearance: He is well-developed and well-nourished. He is not diaphoretic.  HENT:     Head: Normocephalic and atraumatic.  Cardiovascular:     Rate and Rhythm: Normal rate and regular rhythm.  Pulmonary:     Effort: Pulmonary effort is normal.     Breath sounds: Normal breath sounds.  Abdominal:     General: There is no distension.     Palpations: Abdomen is soft.     Tenderness: There is no abdominal tenderness. There is no right CVA tenderness or left CVA tenderness.  Musculoskeletal:     Right lower leg: No edema.     Left lower leg: No edema.  Skin:    General: Skin is warm and dry.     Findings: No erythema or rash.  Neurological:     Mental Status: He is alert and oriented to person, place, and time.  Psychiatric:        Mood and Affect: Mood and affect normal.        Behavior: Behavior normal.     ED Results / Procedures / Treatments   Labs (all labs ordered are listed, but only abnormal results are displayed) Labs Reviewed  URINALYSIS, ROUTINE W REFLEX MICROSCOPIC - Abnormal; Notable  for the following components:      Result Value   APPearance CLOUDY (*)    Hgb urine dipstick SMALL (*)    Leukocytes,Ua LARGE (*)    All other components within normal limits  URINALYSIS, MICROSCOPIC (REFLEX) - Abnormal; Notable for the following components:   Bacteria, UA MANY (*)    All other components within normal limits  URINE CULTURE    EKG None  Radiology No results found.  Procedures Procedures  Medications Ordered in ED Medications  cefTRIAXone (ROCEPHIN) injection 1 g (has no administration in time range)    ED Course  I have reviewed the triage vital signs and the nursing notes.  Pertinent labs & imaging results that were available during my care of the patient were reviewed by me and considered in my medical decision making (see chart for details).  Clinical Course as of 07/29/20 1445  Tue Jul 29, 1962  5319 80 year old male with complaint of dysuria and frequency for the past few days.  Denies systemic symptoms.  On exam, patient is well-appearing, no CVA tenderness, abdomen is soft and nontender.  Urinalysis with small hemoglobin, large leukocytes with greater than 50 white blood cells and many bacteria.  Urine sent out for culture.  Patient be given IM Rocephin while in the emergency room and discharged on Keflex.  Recommend he recheck with his doctor in 1 week, given strict return to ER precautions. [LM]    Clinical Course User Index [LM] Roque Lias   MDM Rules/Calculators/A&P                          Final Clinical Impression(s) / ED Diagnoses Final diagnoses:  Urinary tract infection in male    Rx / DC Orders ED Discharge Orders         Ordered    cephALEXin (KEFLEX) 500 MG capsule  4 times daily        07/29/20 1443           Roque Lias 07/29/20 1445    Charlesetta Shanks, MD 07/30/20 [phone]

## 2020-07-29 NOTE — Discharge Instructions (Addendum)
Take antibiotics as prescribed and complete the full course. Follow-up with your doctor, call to schedule appointment in 1 week. Return to the emergency room for any worsening or concerning symptoms.

## 2020-07-29 NOTE — ED Notes (Signed)
Got patient into a gown patient is resting with call bell in reach

## 2020-07-30 LAB — URINE CULTURE: Culture: NO GROWTH

## 2020-07-31 ENCOUNTER — Emergency Department (HOSPITAL_COMMUNITY)
Admission: EM | Admit: 2020-07-31 | Discharge: 2020-08-01 | Disposition: A | Discharge status: Home / Self Care | Payer: Medicare PPO | Attending: Emergency Medicine | Admitting: Emergency Medicine

## 2020-07-31 ENCOUNTER — Other Ambulatory Visit: Payer: Self-pay

## 2020-07-31 ENCOUNTER — Encounter (HOSPITAL_COMMUNITY): Payer: Self-pay | Admitting: Emergency Medicine

## 2020-07-31 ENCOUNTER — Emergency Department (HOSPITAL_COMMUNITY): Payer: Medicare PPO

## 2020-07-31 DIAGNOSIS — Z87891 Personal history of nicotine dependence: Secondary | ICD-10-CM | POA: Diagnosis not present

## 2020-07-31 DIAGNOSIS — F0390 Unspecified dementia without behavioral disturbance: Secondary | ICD-10-CM | POA: Diagnosis not present

## 2020-07-31 DIAGNOSIS — R413 Other amnesia: Secondary | ICD-10-CM | POA: Insufficient documentation

## 2020-07-31 DIAGNOSIS — I10 Essential (primary) hypertension: Secondary | ICD-10-CM | POA: Insufficient documentation

## 2020-07-31 DIAGNOSIS — R4182 Altered mental status, unspecified: Secondary | ICD-10-CM | POA: Diagnosis present

## 2020-07-31 DIAGNOSIS — E119 Type 2 diabetes mellitus without complications: Secondary | ICD-10-CM | POA: Diagnosis not present

## 2020-07-31 DIAGNOSIS — R451 Restlessness and agitation: Secondary | ICD-10-CM | POA: Diagnosis not present

## 2020-07-31 DIAGNOSIS — Z79899 Other long term (current) drug therapy: Secondary | ICD-10-CM | POA: Diagnosis not present

## 2020-07-31 DIAGNOSIS — R410 Disorientation, unspecified: Secondary | ICD-10-CM | POA: Diagnosis not present

## 2020-07-31 NOTE — ED Notes (Signed)
Pt attempting to urinate in the sink. Redirected and assisted back to bed, urinal provided.
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
## 2020-07-31 NOTE — ED Triage Notes (Signed)
Pt from home family states increasing confusion over last month that worsten over last few days. Son states pt found today standing in rain bear foot and confused also could not recognize daughter on arrival of EMS tonight, pt has recently dx with UTI

## 2020-07-31 NOTE — ED Notes (Signed)
Pt told this Probation officer that he needed to use the restroom. This Probation officer offered patient a urinal but patient requested to walk to the restroom. With little assistance from this writer, pt walked to the restroom. Pt instructed to pee inside of the hats that were placed in the toilet. Pt stated ok. This writer went to check on patient in the restroom, pt was just standing over the toilet looking into the hats. No urine in hats. Pt was just standing in bathroom looking as if he was wondering where he was

## 2020-08-01 DIAGNOSIS — I10 Essential (primary) hypertension: Secondary | ICD-10-CM | POA: Diagnosis not present

## 2020-08-01 LAB — COMPREHENSIVE METABOLIC PANEL
ALT: 25 U/L (ref 0–44)
AST: 61 U/L — ABNORMAL HIGH (ref 15–41)
Albumin: 4 g/dL (ref 3.5–5.0)
Alkaline Phosphatase: 53 U/L (ref 38–126)
Anion gap: 14 (ref 5–15)
BUN: 21 mg/dL (ref 8–23)
CO2: 20 mmol/L — ABNORMAL LOW (ref 22–32)
Calcium: 9.9 mg/dL (ref 8.9–10.3)
Chloride: 100 mmol/L (ref 98–111)
Creatinine, Ser: 1.63 mg/dL — ABNORMAL HIGH (ref 0.61–1.24)
GFR, Estimated: 42 mL/min — ABNORMAL LOW (ref >60)
Glucose, Bld: 106 mg/dL — ABNORMAL HIGH (ref 70–99)
Potassium: 4.3 mmol/L (ref 3.5–5.1)
Sodium: 134 mmol/L — ABNORMAL LOW (ref 135–145)
Total Bilirubin: 0.8 mg/dL (ref 0.3–1.2)
Total Protein: 8.3 g/dL — ABNORMAL HIGH (ref 6.5–8.1)

## 2020-08-01 LAB — URINALYSIS, ROUTINE W REFLEX MICROSCOPIC
Bilirubin Urine: NEGATIVE
Glucose, UA: NEGATIVE mg/dL
Ketones, ur: NEGATIVE mg/dL
Nitrite: NEGATIVE
Protein, ur: NEGATIVE mg/dL
Specific Gravity, Urine: 1.01 (ref 1.005–1.030)
pH: 6 (ref 5.0–8.0)

## 2020-08-01 LAB — CBC WITH DIFFERENTIAL/PLATELET
Abs Immature Granulocytes: 0.07 10*3/uL (ref 0.00–0.07)
Basophils Absolute: 0 10*3/uL (ref 0.0–0.1)
Basophils Relative: 1 %
Eosinophils Absolute: 0 10*3/uL (ref 0.0–0.5)
Eosinophils Relative: 0 %
HCT: 40.2 % (ref 39.0–52.0)
Hemoglobin: 12.9 g/dL — ABNORMAL LOW (ref 13.0–17.0)
Immature Granulocytes: 1 %
Lymphocytes Relative: 19 %
Lymphs Abs: 1.1 10*3/uL (ref 0.7–4.0)
MCH: 28.5 pg (ref 26.0–34.0)
MCHC: 32.1 g/dL (ref 30.0–36.0)
MCV: 88.7 fL (ref 80.0–100.0)
Monocytes Absolute: 0.6 10*3/uL (ref 0.1–1.0)
Monocytes Relative: 10 %
Neutro Abs: 4 10*3/uL (ref 1.7–7.7)
Neutrophils Relative %: 69 %
Platelets: 193 10*3/uL (ref 150–400)
RBC: 4.53 MIL/uL (ref 4.22–5.81)
RDW: 12.8 % (ref 11.5–15.5)
WBC: 5.8 10*3/uL (ref 4.0–10.5)
nRBC: 0 % (ref 0.0–0.2)

## 2020-08-01 LAB — URINALYSIS, MICROSCOPIC (REFLEX)

## 2020-08-01 LAB — CBG MONITORING, ED: Glucose-Capillary: 109 mg/dL — ABNORMAL HIGH (ref 70–99)

## 2020-08-01 MED ORDER — SODIUM CHLORIDE 0.9 % IV BOLUS
500.0000 mL | Freq: Once | INTRAVENOUS | Status: AC
  Administered 2020-08-01: 500 mL via INTRAVENOUS

## 2020-08-01 MED ORDER — HALOPERIDOL LACTATE 5 MG/ML IJ SOLN
2.0000 mg | Freq: Once | INTRAMUSCULAR | Status: AC
  Administered 2020-08-01: 2 mg via INTRAMUSCULAR
  Filled 2020-08-01: qty 1

## 2020-08-01 MED ORDER — LORAZEPAM 2 MG/ML IJ SOLN
2.0000 mg | Freq: Once | INTRAMUSCULAR | Status: AC
  Administered 2020-08-01: 2 mg via INTRAMUSCULAR
  Filled 2020-08-01: qty 1

## 2020-08-01 NOTE — ED Notes (Signed)
Pt pulled out IV, bleeding is noted. Pt verbally abusive to staff and attempting to leave, security called, meds ordered by provider.

## 2020-08-01 NOTE — ED Notes (Signed)
Called daughter and made aware of plan to d/c home with Home Health, daughter agrees with this plan.

## 2020-08-01 NOTE — ED Notes (Signed)
Patient Urinated on self/bed Blue scrubs given Clean sheets

## 2020-08-01 NOTE — ED Notes (Signed)
Patient ambulating around unit looking for exit. Patient states he is getting out of here and he isn't a prisioner, patient placed soiled clothes of his own back on. MD notified and patient redirected back to room.

## 2020-08-01 NOTE — Discharge Planning (Signed)
Arvilla Salada J. Clydene Laming, RN, BSN, [place] [phone] RNCM spoke with pt at bedside regarding discharge planning for [name]. Offered pt medicare.gov list of home health agencies to choose from.  Pt chose [facility] to render services. Michael of [facility]  notified. Patient made aware that [facility]  will be in contact in 24-48 hours.  No DME needs identified at this time.

## 2020-08-01 NOTE — ED Provider Notes (Signed)
Per Electrical engineer, patient not a candidate for nursing home.  Will be discharged home with home health.   Luna Fuse, MD 08/01/20 1049

## 2020-08-01 NOTE — ED Notes (Signed)
Per MD and social worker patient okay for discharge home with daughter

## 2020-08-01 NOTE — ED Notes (Signed)
Daughter will be here in about 10 mins will have patient ready

## 2020-08-01 NOTE — Progress Notes (Signed)
..   Transition of Care [facility]) - Emergency Department Mini Assessment   Patient Details  Name: [name] MRN: [id number] Date of Birth: 1941/04/05  Transition of Care [facility]) CM/SW Contact:    [name], LCSWA Phone Number: 08/01/2020, 10:22 AM   Clinical Narrative: [facility] CM/CSW spoke with [name]/pts son [phone].  Venancio stated pt needs HH.  Calvert also asked that RN gives him a call with an update.  Cydnee Fuquay [name], MSW, LCSW-A Pronouns:  She, Her, Hers                  [facility] ED Transitions of CareClinical Social Worker [email or web address] [phone]   ED Mini Assessment: What brought you to the Emergency Department? : EMS  Barriers to Discharge: No Barriers Identified     Means of departure: Not know  Interventions which prevented an admission or readmission: SNF [place] or Services    Patient Contact and Communications Key Contact 1: [place] with: [place] Found Contact Date: 08/01/20,     Contact Phone Number: [phone] Call outcome: Patient is in need of [place].  Kacin would like RN to contact him with an update.  Patient states their goals for this hospitalization and ongoing recovery are:: Pt wants to go home. CMS Medicare.gov Compare Post Acute Care list provided to:: Patient Choice offered to / list presented to : Patient  Admission diagnosis:  AMS; possible UTI Patient Active Problem List   Diagnosis Date Noted  . Episodic tension-type headache, not intractable 12/07/2019  . Swelling around both eyes 07/18/2019  . Weight loss 02/06/2019  . B12 deficiency 07/21/2017  . Osteoarthritis 05/12/2017  . Hyperlipidemia 12/20/2016  . Memory loss 07/06/2016  . Insomnia 01/09/2015  . Diabetes mellitus ([place]) 06/05/2014  . 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 ANEMIA-IRON DEFICIENCY 07/07/2009  . Essential hypertension 04/21/2007  . Chronic  urticaria 04/21/2007   PCP:  Richarda Osmond, DO Pharmacy:   [id number] - [place], [place] [address] [place] [address] Phone: [phone] Fax: [phone]

## 2020-08-02 LAB — URINE CULTURE: Culture: NO GROWTH

## 2020-08-04 ENCOUNTER — Telehealth: Payer: Self-pay | Admitting: *Deleted

## 2020-08-04 NOTE — Telephone Encounter (Signed)
TOC CSW received call from pt's dtr, [place]. Stating HH has not started and she wanted to follow up. Contacted [facility] and they are scheduled to start today with [facility] or tomorrow. CM contacted dtr to update her on [facility]. Jonnie Finner RN CCM, [facility] ED TOC CM [phone]  Pt's dtr, [name] had questions on how she could assist her father at home. Gave permission to submit referral to [facility]. States she does not know what to do. He was independent but has increasing memory loss that has her concerned. Wanted information on PCP, neurologist, [facility] agency and insurance.   [facility] # [phone] [name] [phone] Referral was sent on 12/2019-please call to arrange appt, it should be good for a year.  Primary Care Physician- Dr. Doristine Mango # [phone] [address] [place] [address] He has to see the PCP within 1-2 weeks for his Home Health to continue to come out. Please call to arrange appt for hospital follow up.   Elder Sports coach -attorney will help with Legal Guardianship or power of attorney #[phone] [address] [place], [place] [address] Will discuss with you and brothers, legal guardianship. This would be better than Power of [place] paperwork. But it is imperative that you get this started as soon as possible. There are a lot of telemarketers that scam the elderly when they have memory problems.   [employer] Medicare -aide/personal care  [phone] Will sometimes cover for him to have an aide in the home for 3-4 hours a day (you have to call for this service).   [facility] # [phone] -His [place] is covered by Mcarthur Rossetti, RN and Physical Therapy.

## 2020-08-06 LAB — CULTURE, BLOOD (ROUTINE X 2)
Culture: NO GROWTH
Special Requests: ADEQUATE

## 2020-08-08 DIAGNOSIS — N401 Enlarged prostate with lower urinary tract symptoms: Secondary | ICD-10-CM | POA: Diagnosis not present

## 2020-08-08 DIAGNOSIS — I10 Essential (primary) hypertension: Secondary | ICD-10-CM | POA: Diagnosis not present

## 2020-08-08 DIAGNOSIS — L508 Other urticaria: Secondary | ICD-10-CM | POA: Diagnosis not present

## 2020-08-08 DIAGNOSIS — D509 Iron deficiency anemia, unspecified: Secondary | ICD-10-CM | POA: Diagnosis not present

## 2020-08-08 DIAGNOSIS — G4700 Insomnia, unspecified: Secondary | ICD-10-CM | POA: Diagnosis not present

## 2020-08-08 DIAGNOSIS — E119 Type 2 diabetes mellitus without complications: Secondary | ICD-10-CM | POA: Diagnosis not present

## 2020-08-08 DIAGNOSIS — J309 Allergic rhinitis, unspecified: Secondary | ICD-10-CM | POA: Diagnosis not present

## 2020-08-08 DIAGNOSIS — M109 Gout, unspecified: Secondary | ICD-10-CM | POA: Diagnosis not present

## 2020-08-08 DIAGNOSIS — M1990 Unspecified osteoarthritis, unspecified site: Secondary | ICD-10-CM | POA: Diagnosis not present

## 2020-08-11 ENCOUNTER — Ambulatory Visit: Payer: Medicare PPO | Admitting: Student in an Organized Health Care Education/Training Program

## 2020-08-11 ENCOUNTER — Other Ambulatory Visit: Payer: Self-pay

## 2020-08-11 ENCOUNTER — Encounter: Payer: Self-pay | Admitting: Student in an Organized Health Care Education/Training Program

## 2020-08-11 VITALS — BP 136/72 | HR 81 | Wt 150.2 lb

## 2020-08-11 DIAGNOSIS — Z794 Long term (current) use of insulin: Secondary | ICD-10-CM

## 2020-08-11 DIAGNOSIS — E1165 Type 2 diabetes mellitus with hyperglycemia: Secondary | ICD-10-CM

## 2020-08-11 DIAGNOSIS — I10 Essential (primary) hypertension: Secondary | ICD-10-CM | POA: Diagnosis not present

## 2020-08-11 DIAGNOSIS — R011 Cardiac murmur, unspecified: Secondary | ICD-10-CM

## 2020-08-11 DIAGNOSIS — Z8659 Personal history of other mental and behavioral disorders: Secondary | ICD-10-CM

## 2020-08-11 LAB — POCT GLYCOSYLATED HEMOGLOBIN (HGB A1C): Hemoglobin A1C: 5.6 % (ref 4.0–5.6)
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
## 2020-08-11 NOTE — Patient Instructions (Signed)
It was a pleasure to see you today!  To summarize our discussion for this visit:  We are going to try to get back on track for care.   I would recommend following up with [facility] clinic. They should reach out to schedule that appointment.   We are getting blood work today and can discuss which medications need to be added back on at that time of results.  We are going to get an echo to evaluate his heart function.   Please give me a call or message if you have any other questions or concerns.   Some additional health maintenance measures we should update are: Health Maintenance Due  Topic Date Due  . COVID-19 Vaccine (1) Never done  . FOOT EXAM  11/08/2018  . OPHTHALMOLOGY EXAM  11/10/2018  . INFLUENZA VACCINE  01/06/2020  .    Please return to our clinic to see me in about a month.  Call the clinic at [phone] if your symptoms worsen or you have any concerns.   Thank you for allowing me to take part in your care,  Dr. Doristine Mango

## 2020-08-11 NOTE — Assessment & Plan Note (Signed)
Murmur on exam with possible new LE edema.  No h/o echo on chart review R/f for heart disease Ordering an echo.

## 2020-08-11 NOTE — Assessment & Plan Note (Signed)
Has not been adherent with medications. Shows mild weight loss Will check A1c, lipids, CMP today and treat as needed

## 2020-08-11 NOTE — Assessment & Plan Note (Signed)
Well controlled today without medication CMP today. Recheck BP at follow up. Will hold treatment for now.

## 2020-08-11 NOTE — Assessment & Plan Note (Signed)
I have personally seen cognitive decline in patient over my time as his PCP. Daughter is seeing steep decline recently.  Patient does not share these concerns.  Daughter was unaware of his medical conditions and care up until this point.  She is very involved in his care at this time and they have agreed to dementia evaluation. I believe he would be a great candidate for geri clinic for comprehensive evaluation and care. Patient had recent labs to evaluate for reversible causes which were normal. Looks like TSH was not resulted so will add on today. PHQ2 negative today. Patient has appointment with PT this week.

## 2020-08-12 DIAGNOSIS — G4700 Insomnia, unspecified: Secondary | ICD-10-CM | POA: Diagnosis not present

## 2020-08-12 DIAGNOSIS — I10 Essential (primary) hypertension: Secondary | ICD-10-CM | POA: Diagnosis not present

## 2020-08-12 DIAGNOSIS — D509 Iron deficiency anemia, unspecified: Secondary | ICD-10-CM | POA: Diagnosis not present

## 2020-08-12 DIAGNOSIS — E119 Type 2 diabetes mellitus without complications: Secondary | ICD-10-CM | POA: Diagnosis not present

## 2020-08-12 DIAGNOSIS — M109 Gout, unspecified: Secondary | ICD-10-CM | POA: Diagnosis not present

## 2020-08-12 DIAGNOSIS — J309 Allergic rhinitis, unspecified: Secondary | ICD-10-CM | POA: Diagnosis not present

## 2020-08-12 DIAGNOSIS — M1990 Unspecified osteoarthritis, unspecified site: Secondary | ICD-10-CM | POA: Diagnosis not present

## 2020-08-12 DIAGNOSIS — N401 Enlarged prostate with lower urinary tract symptoms: Secondary | ICD-10-CM | POA: Diagnosis not present

## 2020-08-12 DIAGNOSIS — L508 Other urticaria: Secondary | ICD-10-CM | POA: Diagnosis not present

## 2020-08-12 LAB — COMPREHENSIVE METABOLIC PANEL
ALT: 25 IU/L (ref 0–44)
AST: 29 IU/L (ref 0–40)
Albumin/Globulin Ratio: 1 — ABNORMAL LOW (ref 1.2–2.2)
Albumin: 3.5 g/dL — ABNORMAL LOW (ref 3.7–4.7)
Alkaline Phosphatase: 48 IU/L (ref 44–121)
BUN/Creatinine Ratio: 10 (ref 10–24)
BUN: 13 mg/dL (ref 8–27)
Bilirubin Total: 0.3 mg/dL (ref 0.0–1.2)
CO2: 23 mmol/L (ref 20–29)
Calcium: 9.4 mg/dL (ref 8.6–10.2)
Chloride: 104 mmol/L (ref 96–106)
Creatinine, Ser: 1.24 mg/dL (ref 0.76–1.27)
Globulin, Total: 3.4 g/dL (ref 1.5–4.5)
Glucose: 89 mg/dL (ref 65–99)
Potassium: 3.6 mmol/L (ref 3.5–5.2)
Sodium: 140 mmol/L (ref 134–144)
Total Protein: 6.9 g/dL (ref 6.0–8.5)
eGFR: 59 mL/min/{1.73_m2} — ABNORMAL LOW (ref >59)

## 2020-08-12 LAB — LIPID PANEL
Chol/HDL Ratio: 5.1 ratio — ABNORMAL HIGH (ref 0.0–5.0)
Cholesterol, Total: 195 mg/dL (ref 100–199)
HDL: 38 mg/dL — ABNORMAL LOW (ref >39)
LDL Chol Calc (NIH): 147 mg/dL — ABNORMAL HIGH (ref 0–99)
Triglycerides: 55 mg/dL (ref 0–149)
VLDL Cholesterol Cal: 10 mg/dL (ref 5–40)

## 2020-08-13 DIAGNOSIS — G4700 Insomnia, unspecified: Secondary | ICD-10-CM | POA: Diagnosis not present

## 2020-08-13 DIAGNOSIS — N401 Enlarged prostate with lower urinary tract symptoms: Secondary | ICD-10-CM | POA: Diagnosis not present

## 2020-08-13 DIAGNOSIS — L508 Other urticaria: Secondary | ICD-10-CM | POA: Diagnosis not present

## 2020-08-13 DIAGNOSIS — J309 Allergic rhinitis, unspecified: Secondary | ICD-10-CM | POA: Diagnosis not present

## 2020-08-13 DIAGNOSIS — M1990 Unspecified osteoarthritis, unspecified site: Secondary | ICD-10-CM | POA: Diagnosis not present

## 2020-08-13 DIAGNOSIS — E119 Type 2 diabetes mellitus without complications: Secondary | ICD-10-CM | POA: Diagnosis not present

## 2020-08-13 DIAGNOSIS — D509 Iron deficiency anemia, unspecified: Secondary | ICD-10-CM | POA: Diagnosis not present

## 2020-08-13 DIAGNOSIS — I10 Essential (primary) hypertension: Secondary | ICD-10-CM | POA: Diagnosis not present

## 2020-08-13 DIAGNOSIS — M109 Gout, unspecified: Secondary | ICD-10-CM | POA: Diagnosis not present

## 2020-08-13 LAB — TSH: TSH: 1.15 u[IU]/mL (ref 0.450–4.500)

## 2020-08-13 LAB — SPECIMEN STATUS REPORT

## 2020-08-15 ENCOUNTER — Other Ambulatory Visit: Payer: Self-pay | Admitting: Student in an Organized Health Care Education/Training Program

## 2020-08-15 DIAGNOSIS — E785 Hyperlipidemia, unspecified: Secondary | ICD-10-CM

## 2020-08-15 MED ORDER — PRAVASTATIN SODIUM 80 MG PO TABS: 80.0000 mg | ORAL_TABLET | Freq: Every day | ORAL | 0 refills | Status: DC

## 2020-08-15 NOTE — Progress Notes (Signed)
Called patient's daughter to inform her of his lab results. Refilling cholesterol medication. And confirmed pharmacy. Informed her of his geri clinic appt and his echo apt

## 2020-08-18 ENCOUNTER — Telehealth: Payer: Self-pay | Admitting: *Deleted

## 2020-08-18 NOTE — Telephone Encounter (Signed)
Spoke with daughter regarding geriatric appt.  Patient is scheduled for 10-09-20 at 1:45pm and paperwork has been mailed and CCM referral placed.  Daughter would like to speak with PCP due to have a concern from the last visit.  Patient has been taking 2 instead of 1 pill of his cholesterol medication and told the daughter it is due to him having "foot pain".  Daughter has since taken the pills home with her and is only taking 1 pill to him a day so he doesn't take more than prescribed.  She would like to know what he can take or do about pain/swelling in his left foot.  Please send to RED team if updates are needed.  Gianlucas Evenson,CMA

## 2020-08-18 NOTE — Telephone Encounter (Signed)
Called daughter and asked her to make appointment to discuss foot pain. She agreed and will call clinic

## 2020-08-21 DIAGNOSIS — I10 Essential (primary) hypertension: Secondary | ICD-10-CM | POA: Diagnosis not present

## 2020-08-21 DIAGNOSIS — M1990 Unspecified osteoarthritis, unspecified site: Secondary | ICD-10-CM | POA: Diagnosis not present

## 2020-08-21 DIAGNOSIS — J309 Allergic rhinitis, unspecified: Secondary | ICD-10-CM | POA: Diagnosis not present

## 2020-08-21 DIAGNOSIS — M109 Gout, unspecified: Secondary | ICD-10-CM | POA: Diagnosis not present

## 2020-08-21 DIAGNOSIS — L508 Other urticaria: Secondary | ICD-10-CM | POA: Diagnosis not present

## 2020-08-21 DIAGNOSIS — N401 Enlarged prostate with lower urinary tract symptoms: Secondary | ICD-10-CM | POA: Diagnosis not present

## 2020-08-21 DIAGNOSIS — G4700 Insomnia, unspecified: Secondary | ICD-10-CM | POA: Diagnosis not present

## 2020-08-21 DIAGNOSIS — E119 Type 2 diabetes mellitus without complications: Secondary | ICD-10-CM | POA: Diagnosis not present

## 2020-08-21 DIAGNOSIS — D509 Iron deficiency anemia, unspecified: Secondary | ICD-10-CM | POA: Diagnosis not present

## 2020-08-23 ENCOUNTER — Inpatient Hospital Stay (HOSPITAL_COMMUNITY)
Admission: EM | Admit: 2020-08-23 | Discharge: 2020-08-27 | DRG: 602 | Disposition: A | Discharge status: Skilled Nursing Facility | Payer: Medicare PPO | Attending: Family Medicine | Admitting: Family Medicine

## 2020-08-23 ENCOUNTER — Encounter (HOSPITAL_COMMUNITY): Payer: Self-pay | Admitting: Emergency Medicine

## 2020-08-23 ENCOUNTER — Emergency Department (HOSPITAL_COMMUNITY): Payer: Medicare PPO

## 2020-08-23 ENCOUNTER — Emergency Department (HOSPITAL_BASED_OUTPATIENT_CLINIC_OR_DEPARTMENT_OTHER): Payer: Medicare PPO

## 2020-08-23 DIAGNOSIS — Z8719 Personal history of other diseases of the digestive system: Secondary | ICD-10-CM | POA: Diagnosis not present

## 2020-08-23 DIAGNOSIS — Z79899 Other long term (current) drug therapy: Secondary | ICD-10-CM | POA: Diagnosis not present

## 2020-08-23 DIAGNOSIS — Z87891 Personal history of nicotine dependence: Secondary | ICD-10-CM | POA: Diagnosis not present

## 2020-08-23 DIAGNOSIS — I5022 Chronic systolic (congestive) heart failure: Secondary | ICD-10-CM

## 2020-08-23 DIAGNOSIS — N1831 Chronic kidney disease, stage 3a: Secondary | ICD-10-CM | POA: Diagnosis present

## 2020-08-23 DIAGNOSIS — Z9049 Acquired absence of other specified parts of digestive tract: Secondary | ICD-10-CM | POA: Diagnosis not present

## 2020-08-23 DIAGNOSIS — I130 Hypertensive heart and chronic kidney disease with heart failure and stage 1 through stage 4 chronic kidney disease, or unspecified chronic kidney disease: Secondary | ICD-10-CM | POA: Diagnosis present

## 2020-08-23 DIAGNOSIS — F0390 Unspecified dementia without behavioral disturbance: Secondary | ICD-10-CM | POA: Diagnosis present

## 2020-08-23 DIAGNOSIS — R4189 Other symptoms and signs involving cognitive functions and awareness: Secondary | ICD-10-CM | POA: Diagnosis not present

## 2020-08-23 DIAGNOSIS — R079 Chest pain, unspecified: Secondary | ICD-10-CM | POA: Diagnosis not present

## 2020-08-23 DIAGNOSIS — J90 Pleural effusion, not elsewhere classified: Secondary | ICD-10-CM | POA: Diagnosis not present

## 2020-08-23 DIAGNOSIS — M109 Gout, unspecified: Secondary | ICD-10-CM | POA: Diagnosis present

## 2020-08-23 DIAGNOSIS — I10 Essential (primary) hypertension: Secondary | ICD-10-CM | POA: Diagnosis not present

## 2020-08-23 DIAGNOSIS — D61818 Other pancytopenia: Secondary | ICD-10-CM | POA: Diagnosis present

## 2020-08-23 DIAGNOSIS — R41841 Cognitive communication deficit: Secondary | ICD-10-CM | POA: Diagnosis not present

## 2020-08-23 DIAGNOSIS — Z833 Family history of diabetes mellitus: Secondary | ICD-10-CM

## 2020-08-23 DIAGNOSIS — Z86010 Personal history of colonic polyps: Secondary | ICD-10-CM

## 2020-08-23 DIAGNOSIS — Z888 Allergy status to other drugs, medicaments and biological substances status: Secondary | ICD-10-CM

## 2020-08-23 DIAGNOSIS — N179 Acute kidney failure, unspecified: Secondary | ICD-10-CM | POA: Diagnosis present

## 2020-08-23 DIAGNOSIS — L0390 Cellulitis, unspecified: Secondary | ICD-10-CM | POA: Diagnosis present

## 2020-08-23 DIAGNOSIS — M2012 Hallux valgus (acquired), left foot: Secondary | ICD-10-CM | POA: Diagnosis not present

## 2020-08-23 DIAGNOSIS — M7989 Other specified soft tissue disorders: Secondary | ICD-10-CM | POA: Diagnosis not present

## 2020-08-23 DIAGNOSIS — M6281 Muscle weakness (generalized): Secondary | ICD-10-CM | POA: Diagnosis not present

## 2020-08-23 DIAGNOSIS — I351 Nonrheumatic aortic (valve) insufficiency: Secondary | ICD-10-CM | POA: Diagnosis present

## 2020-08-23 DIAGNOSIS — Z9114 Patient's other noncompliance with medication regimen: Secondary | ICD-10-CM | POA: Diagnosis not present

## 2020-08-23 DIAGNOSIS — I110 Hypertensive heart disease with heart failure: Secondary | ICD-10-CM | POA: Diagnosis not present

## 2020-08-23 DIAGNOSIS — N400 Enlarged prostate without lower urinary tract symptoms: Secondary | ICD-10-CM | POA: Diagnosis present

## 2020-08-23 DIAGNOSIS — E785 Hyperlipidemia, unspecified: Secondary | ICD-10-CM | POA: Diagnosis present

## 2020-08-23 DIAGNOSIS — J9811 Atelectasis: Secondary | ICD-10-CM | POA: Diagnosis present

## 2020-08-23 DIAGNOSIS — Z609 Problem related to social environment, unspecified: Secondary | ICD-10-CM | POA: Diagnosis not present

## 2020-08-23 DIAGNOSIS — I429 Cardiomyopathy, unspecified: Secondary | ICD-10-CM | POA: Diagnosis present

## 2020-08-23 DIAGNOSIS — M79673 Pain in unspecified foot: Secondary | ICD-10-CM

## 2020-08-23 DIAGNOSIS — E876 Hypokalemia: Secondary | ICD-10-CM | POA: Diagnosis present

## 2020-08-23 DIAGNOSIS — Z20822 Contact with and (suspected) exposure to covid-19: Secondary | ICD-10-CM | POA: Diagnosis present

## 2020-08-23 DIAGNOSIS — R609 Edema, unspecified: Secondary | ICD-10-CM

## 2020-08-23 DIAGNOSIS — Z602 Problems related to living alone: Secondary | ICD-10-CM | POA: Diagnosis not present

## 2020-08-23 DIAGNOSIS — L03116 Cellulitis of left lower limb: Secondary | ICD-10-CM | POA: Diagnosis present

## 2020-08-23 DIAGNOSIS — M79606 Pain in leg, unspecified: Secondary | ICD-10-CM | POA: Diagnosis present

## 2020-08-23 DIAGNOSIS — E1122 Type 2 diabetes mellitus with diabetic chronic kidney disease: Secondary | ICD-10-CM | POA: Diagnosis present

## 2020-08-23 DIAGNOSIS — I5021 Acute systolic (congestive) heart failure: Secondary | ICD-10-CM | POA: Diagnosis present

## 2020-08-23 DIAGNOSIS — E538 Deficiency of other specified B group vitamins: Secondary | ICD-10-CM | POA: Diagnosis present

## 2020-08-23 DIAGNOSIS — R7989 Other specified abnormal findings of blood chemistry: Secondary | ICD-10-CM | POA: Diagnosis not present

## 2020-08-23 DIAGNOSIS — R404 Transient alteration of awareness: Secondary | ICD-10-CM | POA: Diagnosis not present

## 2020-08-23 DIAGNOSIS — I509 Heart failure, unspecified: Secondary | ICD-10-CM | POA: Diagnosis not present

## 2020-08-23 DIAGNOSIS — R778 Other specified abnormalities of plasma proteins: Secondary | ICD-10-CM | POA: Diagnosis present

## 2020-08-23 DIAGNOSIS — M1990 Unspecified osteoarthritis, unspecified site: Secondary | ICD-10-CM | POA: Diagnosis present

## 2020-08-23 DIAGNOSIS — M2550 Pain in unspecified joint: Secondary | ICD-10-CM | POA: Diagnosis not present

## 2020-08-23 DIAGNOSIS — Z7401 Bed confinement status: Secondary | ICD-10-CM | POA: Diagnosis not present

## 2020-08-23 DIAGNOSIS — E119 Type 2 diabetes mellitus without complications: Secondary | ICD-10-CM | POA: Diagnosis not present

## 2020-08-23 HISTORY — DX: Cellulitis, unspecified: L03.90

## 2020-08-23 HISTORY — DX: Other specified abnormalities of plasma proteins: R77.8

## 2020-08-23 HISTORY — DX: Other specified abnormal findings of blood chemistry: R79.89

## 2020-08-23 LAB — BASIC METABOLIC PANEL
Anion gap: 7 (ref 5–15)
BUN: 11 mg/dL (ref 8–23)
CO2: 26 mmol/L (ref 22–32)
Calcium: 9.2 mg/dL (ref 8.9–10.3)
Chloride: 103 mmol/L (ref 98–111)
Creatinine, Ser: 1.17 mg/dL (ref 0.61–1.24)
GFR, Estimated: >60 mL/min (ref >60)
Glucose, Bld: 112 mg/dL — ABNORMAL HIGH (ref 70–99)
Potassium: 3.3 mmol/L — ABNORMAL LOW (ref 3.5–5.1)
Sodium: 136 mmol/L (ref 135–145)

## 2020-08-23 LAB — CBC
HCT: 33.3 % — ABNORMAL LOW (ref 39.0–52.0)
Hemoglobin: 10.9 g/dL — ABNORMAL LOW (ref 13.0–17.0)
MCH: 28.9 pg (ref 26.0–34.0)
MCHC: 32.7 g/dL (ref 30.0–36.0)
MCV: 88.3 fL (ref 80.0–100.0)
Platelets: 203 10*3/uL (ref 150–400)
RBC: 3.77 MIL/uL — ABNORMAL LOW (ref 4.22–5.81)
RDW: 12.5 % (ref 11.5–15.5)
WBC: 3.5 10*3/uL — ABNORMAL LOW (ref 4.0–10.5)
nRBC: 0 % (ref 0.0–0.2)

## 2020-08-23 LAB — TROPONIN I (HIGH SENSITIVITY)
Troponin I (High Sensitivity): 114 ng/L (ref <18)
Troponin I (High Sensitivity): 122 ng/L (ref <18)

## 2020-08-23 LAB — LACTIC ACID, PLASMA: Lactic Acid, Venous: 1.2 mmol/L (ref 0.5–1.9)

## 2020-08-23 LAB — URIC ACID: Uric Acid, Serum: 6.2 mg/dL (ref 3.7–8.6)

## 2020-08-23 LAB — BRAIN NATRIURETIC PEPTIDE: B Natriuretic Peptide: 985.6 pg/mL — ABNORMAL HIGH (ref 0.0–100.0)

## 2020-08-23 MED ORDER — ACETAMINOPHEN 650 MG RE SUPP: 650.0000 mg | Freq: Four times a day (QID) | RECTAL | Status: DC | PRN

## 2020-08-23 MED ORDER — SODIUM CHLORIDE 0.9 % IV SOLN
2.0000 g | Freq: Once | INTRAVENOUS | Status: AC
  Administered 2020-08-23: 2 g via INTRAVENOUS
  Filled 2020-08-23: qty 20

## 2020-08-23 MED ORDER — MORPHINE SULFATE (PF) 2 MG/ML IV SOLN
2.0000 mg | Freq: Once | INTRAVENOUS | Status: AC
Start: 2020-08-23 — End: 2020-08-23
  Administered 2020-08-23: 2 mg via INTRAVENOUS
  Filled 2020-08-23: qty 1

## 2020-08-23 MED ORDER — PRAVASTATIN SODIUM 40 MG PO TABS
80.0000 mg | ORAL_TABLET | Freq: Every day | ORAL | Status: DC
  Administered 2020-08-23 – 2020-08-25 (×3): 80 mg via ORAL
  Filled 2020-08-23 (×3): qty 2

## 2020-08-23 MED ORDER — ACETAMINOPHEN 325 MG PO TABS
650.0000 mg | ORAL_TABLET | Freq: Four times a day (QID) | ORAL | Status: DC | PRN
  Filled 2020-08-23: qty 2

## 2020-08-23 MED ORDER — CEPHALEXIN 500 MG PO CAPS: 500.0000 mg | ORAL_CAPSULE | Freq: Four times a day (QID) | ORAL | Status: DC

## 2020-08-23 MED ORDER — ACETAMINOPHEN 500 MG PO TABS
1000.0000 mg | ORAL_TABLET | Freq: Once | ORAL | Status: AC
  Administered 2020-08-23: 1000 mg via ORAL
  Filled 2020-08-23: qty 2

## 2020-08-23 MED ORDER — ENOXAPARIN SODIUM 40 MG/0.4ML ~~LOC~~ SOLN
40.0000 mg | SUBCUTANEOUS | Status: DC
  Administered 2020-08-23 – 2020-08-26 (×4): 40 mg via SUBCUTANEOUS
  Filled 2020-08-23 (×4): qty 0.4

## 2020-08-23 MED ORDER — FUROSEMIDE 10 MG/ML IJ SOLN
40.0000 mg | Freq: Once | INTRAMUSCULAR | Status: AC
  Administered 2020-08-23: 40 mg via INTRAVENOUS
  Filled 2020-08-23: qty 4

## 2020-08-23 NOTE — ED Notes (Signed)
Provider at bedside

## 2020-08-23 NOTE — ED Triage Notes (Signed)
Pt's complaint on arrival was chest pain.  However, he denies chest pain at present.  States he is here for swelling and pain to bilateral lower extremities.  Denies SOB.

## 2020-08-23 NOTE — ED Provider Notes (Signed)
[place] [facility] EMERGENCY DEPARTMENT Provider Note   CSN: [id number] Arrival date & time: 08/23/20  1120     History Chief Complaint  Patient presents with  . Leg Swelling    [name] is a 80 y.o. male.  HPI Patient reports has been getting increasing swelling of his legs.  He reports it has been going on for at least several weeks.  They do not improve with elevation.  He reports that it is getting painful to walk.  Reports the left leg is more painful than the right.  He denies he has any medications to take for fluid retention.  Patient had initially endorsed some chest pain to triage intake.  This time he denies any chest pain.  He reports he had some a few days ago but it did not last long.  He denies feeling significantly short of breath.  No fevers no chills.  No cough    Past Medical History:  Diagnosis Date  . Allergic rhinitis   . Anemia   . Angio-edema   . B12 deficiency   . BPH (benign prostatic hyperplasia)    [place]  . Diabetes ([place])   . Gout   . Hyperlipidemia   . Hypertension   . Mallory-Weiss tear   . Thyroid disorder   . 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 Urticaria     Patient Active Problem List   Diagnosis Date Noted  . History of dementia 08/11/2020  . Murmur, cardiac 08/11/2020  . Episodic tension-type headache, not intractable 12/07/2019  . Swelling around both eyes 07/18/2019  . Weight loss 02/06/2019  . B12 deficiency 07/21/2017  . Osteoarthritis 05/12/2017  . Hyperlipidemia 12/20/2016  . Memory loss 07/06/2016  . Insomnia 01/09/2015  . Diabetes mellitus ([place]) 06/05/2014  . 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 ANEMIA-IRON DEFICIENCY 07/07/2009  . Essential hypertension 04/21/2007  . Chronic urticaria 04/21/2007    Past Surgical History:  Procedure Laterality Date  . ADENOIDECTOMY    . CHOLECYSTECTOMY  1960  . COLON SURGERY     right hemi-colectomy  . COLONOSCOPY  multiple  .  ESOPHAGOGASTRODUODENOSCOPY    . TONSILLECTOMY         Family History  Problem Relation Age of Onset  . Diabetes Other        nephew  . Colon cancer Neg Hx     Social History   Tobacco Use  . Smoking status: Former Smoker    Packs/day: 1.00    Years: 30.00    Pack years: 30.00    Types: Cigarettes    Quit date: 06/07/2001    Years since quitting: 19.2  . Smokeless tobacco: Former Systems developer    Types: Chew    Quit date: 06/07/2001  Vaping Use  . Vaping Use: Never used  Substance Use Topics  . Alcohol use: No    Alcohol/week: 0.0 standard drinks    Comment: quit about 10 years ago   . Drug use: No    Home Medications Prior to Admission medications   Medication Sig Start Date End Date Taking? Authorizing Provider  pravastatin (PRAVACHOL) 80 MG tablet Take 1 tablet (80 mg total) by mouth daily. 08/15/20  Yes Anderson, Chelsey L, DO    Allergies    Ace inhibitors and Valsartan  Review of Systems   Review of Systems 10 systems reviewed and negative except as per HPI Physical Exam Updated Vital Signs BP 139/76   Pulse (!) 57   Temp  98.4 F (36.9 C)   Resp 14   SpO2 98%   Physical Exam Constitutional:      Comments: Alert nontoxic.  No respiratory distress at rest.  HENT:     Mouth/Throat:     Pharynx: Oropharynx is clear.  Eyes:     Extraocular Movements: Extraocular movements intact.  Cardiovascular:     Rate and Rhythm: Normal rate and regular rhythm.  Pulmonary:     Effort: Pulmonary effort is normal.     Breath sounds: Normal breath sounds.  Abdominal:     General: There is no distension.     Palpations: Abdomen is soft.     Tenderness: There is no abdominal tenderness. There is no guarding.  Musculoskeletal:     Comments: 2+ pitting edema bilateral lower extremities.  Warm to touch erythema left foot and ankle.  Skin:    General: Skin is warm and dry.  Neurological:     General: No focal deficit present.     Mental Status: He is oriented to person,  place, and time.     Coordination: Coordination normal.  Psychiatric:        Mood and Affect: Mood normal.         ED Results / Procedures / Treatments   Labs (all labs ordered are listed, but only abnormal results are displayed) Labs Reviewed  BASIC METABOLIC PANEL - Abnormal; Notable for the following components:      Result Value   Potassium 3.3 (*)    Glucose, Bld 112 (*)    All other components within normal limits  CBC - Abnormal; Notable for the following components:   WBC 3.5 (*)    RBC 3.77 (*)    Hemoglobin 10.9 (*)    HCT 33.3 (*)    All other components within normal limits  BRAIN NATRIURETIC PEPTIDE - Abnormal; Notable for the following components:   B Natriuretic Peptide 985.6 (*)    All other components within normal limits  TROPONIN I (HIGH SENSITIVITY) - Abnormal; Notable for the following components:   Troponin I (High Sensitivity) 114 (*)    All other components within normal limits  TROPONIN I (HIGH SENSITIVITY) - Abnormal; Notable for the following components:   Troponin I (High Sensitivity) 122 (*)    All other components within normal limits  LACTIC ACID, PLASMA  URIC ACID  LACTIC ACID, PLASMA    EKG EKG Interpretation  Date/Time:  Saturday August 23 2020 11:34:37 EDT Ventricular Rate:  89 PR Interval:  178 QRS Duration: 118 QT Interval:  398 QTC Calculation: 484 R Axis:   -34 Text Interpretation: Normal sinus rhythm Left axis deviation Non-specific intra-ventricular conduction delay Minimal voltage criteria for LVH, may be normal variant ( Cornell product ) Nonspecific T wave abnormality Abnormal ECG no STEMI. anterior early repolarization appears to be evolving ischemic changes from  08/01/2020 Confirmed by Charlesetta Shanks [phone]) on 08/23/2020 11:41:40 AM   Radiology DG Chest 2 View  Result Date: 08/23/2020 CLINICAL DATA:  Chest pain, leg swelling for 3 days EXAM: CHEST - 2 VIEW COMPARISON:  Chest radiograph dated 01/30/2010 FINDINGS: The  heart is enlarged. There is a small left pleural effusion with associated atelectasis. The right lung is clear with no pleural effusion. There is no pneumothorax. The visualized skeletal structures are unremarkable. IMPRESSION: Small left pleural effusion with associated atelectasis. Electronically Signed   By: Zerita Boers M.D.   On: 08/23/2020 12:53   VAS [name] LOWER EXTREMITY VENOUS (DVT) (ONLY  [facility])  Result Date: 08/23/2020  Lower Venous DVT Study Indications: Swelling.  Risk Factors: None identified. Comparison Study: No prior studies. Performing Technologist: Oliver Hum RVT  Examination Guidelines: A complete evaluation includes B-mode imaging, spectral Doppler, color Doppler, and power Doppler as needed of all accessible portions of each vessel. Bilateral testing is considered an integral part of a complete examination. Limited examinations for reoccurring indications may be performed as noted. The reflux portion of the exam is performed with the patient in reverse Trendelenburg.  +---------+---------------+---------+-----------+----------+--------------+ RIGHT    CompressibilityPhasicitySpontaneityPropertiesThrombus Aging +---------+---------------+---------+-----------+----------+--------------+ CFV      Full           Yes      Yes                                 +---------+---------------+---------+-----------+----------+--------------+ SFJ      Full                                                        +---------+---------------+---------+-----------+----------+--------------+ FV Prox  Full                                                        +---------+---------------+---------+-----------+----------+--------------+ FV Mid   Full                                                        +---------+---------------+---------+-----------+----------+--------------+ FV DistalFull                                                         +---------+---------------+---------+-----------+----------+--------------+ PFV      Full                                                        +---------+---------------+---------+-----------+----------+--------------+ POP      Full           Yes      Yes                                 +---------+---------------+---------+-----------+----------+--------------+ PTV      Full                                                        +---------+---------------+---------+-----------+----------+--------------+ PERO     Full                                                        +---------+---------------+---------+-----------+----------+--------------+   +---------+---------------+---------+-----------+----------+--------------+  LEFT     CompressibilityPhasicitySpontaneityPropertiesThrombus Aging +---------+---------------+---------+-----------+----------+--------------+ CFV      Full           Yes      Yes                                 +---------+---------------+---------+-----------+----------+--------------+ SFJ      Full                                                        +---------+---------------+---------+-----------+----------+--------------+ FV Prox  Full                                                        +---------+---------------+---------+-----------+----------+--------------+ FV Mid   Full                                                        +---------+---------------+---------+-----------+----------+--------------+ FV DistalFull                                                        +---------+---------------+---------+-----------+----------+--------------+ PFV      Full                                                        +---------+---------------+---------+-----------+----------+--------------+ POP      Full           Yes      Yes                                  +---------+---------------+---------+-----------+----------+--------------+ PTV      Full                                                        +---------+---------------+---------+-----------+----------+--------------+ PERO     Full                                                        +---------+---------------+---------+-----------+----------+--------------+     Summary: RIGHT: - There is no evidence of deep vein thrombosis in the lower extremity.  - No cystic structure found in the popliteal fossa.  LEFT: - There is no evidence of deep vein thrombosis in the lower extremity.  - No  cystic structure found in the popliteal fossa.  *See table(s) above for measurements and observations.    Preliminary     Procedures Procedures   Medications Ordered in ED Medications  cefTRIAXone (ROCEPHIN) 2 g in sodium chloride 0.9 % 100 mL IVPB (has no administration in time range)  furosemide (LASIX) injection 40 mg (has no administration in time range)  morphine 2 MG/ML injection 2 mg (2 mg Intravenous Given 08/23/20 1503)  acetaminophen (TYLENOL) tablet 1,000 mg (1,000 mg Oral Given 08/23/20 1520)    ED Course  I have reviewed the triage vital signs and the nursing notes.  Pertinent labs & imaging results that were available during my care of the patient were reviewed by me and considered in my medical decision making (see chart for details).    MDM Rules/Calculators/A&P                         Patient presents with lower extremity swelling.  Left lower extremity slightly more swollen and foot is erythematous.  DVT studies are negative.  At this time suspect peripheral edema with cellulitis of the left lower extremity.  Patient does have mild elevation in troponin.  Also some BNP elevation.  He does not appear dyspneic at this time.  However findings are suggestive of volume overload and possible demand ischemia.  Patient will need admission for IV antibiotics for cellulitis and continued  monitoring and rule out MI,  although clinically he does not appear to have ACS with no chest pain, not endorsing anginal equivalent symptoms and no ischemic changes to EKG at this time.  Patient will be admitted to family medical service  Final Clinical Impression(s) / ED Diagnoses Final diagnoses:  Peripheral edema  Cellulitis of left lower extremity  Troponin level elevated    Rx / DC Orders ED Discharge Orders    None       Charlesetta Shanks, MD 08/23/20 2204

## 2020-08-23 NOTE — ED Notes (Signed)
The pt is watching tv

## 2020-08-23 NOTE — ED Notes (Signed)
Swollen lt leg  Pt appears not to know  How long it has been swollen

## 2020-08-23 NOTE — ED Notes (Signed)
The lt foot is red and swollen also

## 2020-08-23 NOTE — H&P (Signed)
[facility] Admission History and Physical Service Pager: [phone]  Patient name: [name] Medical record number: [id number] Date of birth: 1941/05/17 Age: 80 y.o. Gender: male  Primary Care Provider: Richarda Osmond, DO Consultants: None Code Status: Full code Preferred Emergency Contact:  Contact Information    Name Relation Home Work [place], [place] Daughter   [phone]   Deandrea, Rion   [phone]      Chief Complaint:  legs swelling and pain  Assessment and Plan: [name] is a 80 y.o. male presenting with chest pain (resolved) . PMH is significant for tubulovillous adenoma, Mallory-Weiss tear, hypertension, hyperlipidemia, gout, diabetes, BPH, B12 deficiency.  Left lower extremity cellulitis Patient presents with left foot erythema in addition to bilateral lower extremity edema.  Patient reports that he has had pain in his left foot for several weeks.  He denies any recent fever or illness.  Patient states that he has not been taking anything to help with the pain in his foot.  He denies any recent trauma. - admit to medical telemetry, attending Dr. Andria Frames  -Patient received dose of ceftriaxone in ED -We will treat cellulitis with CTXx1  - transition to Keflex 500mg  QID for 6 days as long as patient remains afebrile overnight  - VS per floor   Elevated Troponin, elevated BNP Troponin noted to be 114 and increased to 122 on admission. Non specific T wave changes on EKG without ST elevation.  - repeat EKG  - trend troponin  - if continues to trend up, will consider cardiology consult  - in setting Bilateral LE edema, will complete echo while inpatient   Bilateral lower extremity edema 3+ bilateral LE edema. No noted history of heart failure nor hepatic disease, however, has elevated BNP of 985 on admission. DVT U/S negative for bilateral LE extremities. Patient noted to have EKG changes on ED EKG.  N Creatinine within normal limits on admission.  Patient noted to have low white blood cell count of 3.5. -telemetry ordered  -lasix  -echocardiogram ordered  -repeat EKG  -trend troponin levels  -monitor UOP on lasix  -PT/OT - AM CMP   Dementia  Patient noted to have multiple visits with PCP and daughter with concerns about patient's memory and ability to manage his medications at home.  On admission, patient oriented to self and reports living independently ambulating independently.  -We will plan to discuss with family and have patient follow-up with PCP for outpatient management   Leukopenia  WBC 3.5 on admission and PCP notes reporting some weight loss.  - AM CBC   Diabetes, well controlled Last hemoglobin A1c 5.6 on 08/12/2018 PCP noted that patient has not been compliant with medications at home.  Glucose on admission 112.   Hypertension Blood pressure elevated to max of 171/92.  Most recent blood pressure 156/83.  No home medications are recorded for hypertension. -Monitor blood pressure with vitals -Recommend starting amlodipine 5 mg if blood pressure does not correct with pain management  Hyperlipidemia Last LDL measured on 08/11/2020.  Elevated at 147, HDL 38, triglycerides 55. Home meds include pravastatin  - continue pravastatin   History of gout Uric acid 6.2 on admission. No home medications in patient's chart for maintenance therapy.  - will monitor for improvement throughout hospitalization    FEN/GI: Heart healthy diet Prophylaxis: lovenox   Disposition: admit to medical telemetry   History of Present Illness:  [name] is a 80 y.o. male presenting  with bilateral lower extremity edema and left foot cellulitis in the setting of elevated troponin and elevated BNP.  Patient reports that he has been having foot pain and swelling for the last few weeks.  He states that the pain was increasingly severe to the point that he was unable to put on his shoes.   Patient denies recent illness, fevers, GI upset, nausea/vomiting, chest pain or difficulty breathing.  Patient reports that pain has improved since being in the ED.  Patient states that he lives independently and takes multiple medications for problems that he is unable to list at this time.  Review Of Systems: Per HPI with the following additions:   Review of Systems  Constitutional: Negative for fever.  HENT: Negative for rhinorrhea and sore throat.   Respiratory: Negative for cough and shortness of breath.   Cardiovascular: Positive for leg swelling. Negative for chest pain.  Gastrointestinal: Negative for abdominal pain, constipation, diarrhea, nausea and vomiting.  Genitourinary: Negative for dysuria and hematuria.  Musculoskeletal: Positive for myalgias.  Skin: Positive for color change.  Neurological: Negative for headaches.     Patient Active Problem List   Diagnosis Date Noted  . Cellulitis 08/23/2020  . Elevated troponin 08/23/2020  . History of dementia 08/11/2020  . Murmur, cardiac 08/11/2020  . Episodic tension-type headache, not intractable 12/07/2019  . Swelling around both eyes 07/18/2019  . Weight loss 02/06/2019  . B12 deficiency 07/21/2017  . Osteoarthritis 05/12/2017  . Hyperlipidemia 12/20/2016  . Memory loss 07/06/2016  . Insomnia 01/09/2015  . Diabetes mellitus ([place]) 06/05/2014  . 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 ANEMIA-IRON DEFICIENCY 07/07/2009  . Essential hypertension 04/21/2007  . Chronic urticaria 04/21/2007    Past Medical History: Past Medical History:  Diagnosis Date  . Allergic rhinitis   . Anemia   . Angio-edema   . B12 deficiency   . BPH (benign prostatic hyperplasia)    [place]  . Diabetes ([place])   . Gout   . Hyperlipidemia   . Hypertension   . Mallory-Weiss tear   . Thyroid disorder   . 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 Urticaria     Past Surgical History: Past Surgical History:  Procedure Laterality Date   . ADENOIDECTOMY    . CHOLECYSTECTOMY  1960  . COLON SURGERY     right hemi-colectomy  . COLONOSCOPY  multiple  . ESOPHAGOGASTRODUODENOSCOPY    . TONSILLECTOMY      Social History: Social History   Tobacco Use  . Smoking status: Former Smoker    Packs/day: 1.00    Years: 30.00    Pack years: 30.00    Types: Cigarettes    Quit date: 06/07/2001    Years since quitting: 19.2  . Smokeless tobacco: Former Systems developer    Types: Chew    Quit date: 06/07/2001  Vaping Use  . Vaping Use: Never used  Substance Use Topics  . Alcohol use: No    Alcohol/week: 0.0 standard drinks    Comment: quit about 10 years ago   . Drug use: No   Additional social history: Patient lives independently, noted to have some issues with dementia Please also refer to relevant sections of EMR.  Family History: Family History  Problem Relation Age of Onset  . Diabetes Other        nephew  . Colon cancer Neg Hx     Allergies and Medications: Allergies  Allergen Reactions  . Ace Inhibitors Swelling and Other (See Comments)  Site of swelling not known by the patient  . Valsartan Swelling and Other (See Comments)    Site of swelling not known by the patient   No current facility-administered medications on file prior to encounter.   Current Outpatient Medications on File Prior to Encounter  Medication Sig Dispense Refill  . pravastatin (PRAVACHOL) 80 MG tablet Take 1 tablet (80 mg total) by mouth daily. 90 tablet 0    Objective: BP (!) 156/83 (BP Location: Right Arm)   Pulse 66   Temp 98.4 F (36.9 C) (Oral)   Resp 16   Ht 6\' 2"  (1.88 m)   Wt 66 kg   SpO2 99%   BMI 18.68 kg/m  Exam: General: Elderly male sitting upright in bed in no acute distress Eyes: No scleral icterus, extraocular muscles intact bilaterally Cardiovascular: Regular rate and rhythm, is appreciated, unable to appreciate pulses deceptively dorsalis pedis and posterior tibial secondary to edema Respiratory: Clear to  auscultation bilaterally without wheezing or crackles Gastrointestinal: Soft, nontender do not appreciate any hepatomegaly MSK: Bilateral lower extremity edema, 3+, erythema on dorsal aspect of left foot is to manipulation of feet Derm: No skin breakdown or ulceration appreciated on bilateral lower extremities Neuro: Alert, oriented to self  Labs and Imaging: CBC BMET  Recent Labs  Lab 08/23/20 1142  WBC 3.5*  HGB 10.9*  HCT 33.3*  PLT 203   Recent Labs  Lab 08/23/20 1142  NA 136  K 3.3*  CL 103  CO2 26  BUN 11  CREATININE 1.17  GLUCOSE 112*  CALCIUM 9.2     EKG: Difficult to discern P waves on original EKG, will repeat  DG Chest 2 View  Result Date: 08/23/2020 CLINICAL DATA:  Chest pain, leg swelling for 3 days EXAM: CHEST - 2 VIEW COMPARISON:  Chest radiograph dated 01/30/2010 FINDINGS: The heart is enlarged. There is a small left pleural effusion with associated atelectasis. The right lung is clear with no pleural effusion. There is no pneumothorax. The visualized skeletal structures are unremarkable. IMPRESSION: Small left pleural effusion with associated atelectasis. Electronically Signed   By: Zerita Boers M.D.   On: 08/23/2020 12:53   VAS [name] LOWER EXTREMITY VENOUS (DVT) (ONLY [facility] & [facility])  Result Date: 08/23/2020  Lower Venous DVT Study Indications: Swelling.  Risk Factors: None identified. Comparison Study: No prior studies. Performing Technologist: Oliver Hum RVT  Examination Guidelines: A complete evaluation includes B-mode imaging, spectral Doppler, color Doppler, and power Doppler as needed of all accessible portions of each vessel. Bilateral testing is considered an integral part of a complete examination. Limited examinations for reoccurring indications may be performed as noted. The reflux portion of the exam is performed with the patient in reverse Trendelenburg.  +---------+---------------+---------+-----------+----------+--------------+ RIGHT     CompressibilityPhasicitySpontaneityPropertiesThrombus Aging +---------+---------------+---------+-----------+----------+--------------+ CFV      Full           Yes      Yes                                 +---------+---------------+---------+-----------+----------+--------------+ SFJ      Full                                                        +---------+---------------+---------+-----------+----------+--------------+ FV  Prox  Full                                                        +---------+---------------+---------+-----------+----------+--------------+ FV Mid   Full                                                        +---------+---------------+---------+-----------+----------+--------------+ FV DistalFull                                                        +---------+---------------+---------+-----------+----------+--------------+ PFV      Full                                                        +---------+---------------+---------+-----------+----------+--------------+ POP      Full           Yes      Yes                                 +---------+---------------+---------+-----------+----------+--------------+ PTV      Full                                                        +---------+---------------+---------+-----------+----------+--------------+ PERO     Full                                                        +---------+---------------+---------+-----------+----------+--------------+   +---------+---------------+---------+-----------+----------+--------------+ LEFT     CompressibilityPhasicitySpontaneityPropertiesThrombus Aging +---------+---------------+---------+-----------+----------+--------------+ CFV      Full           Yes      Yes                                 +---------+---------------+---------+-----------+----------+--------------+ SFJ      Full                                                         +---------+---------------+---------+-----------+----------+--------------+ FV Prox  Full                                                        +---------+---------------+---------+-----------+----------+--------------+  FV Mid   Full                                                        +---------+---------------+---------+-----------+----------+--------------+ FV DistalFull                                                        +---------+---------------+---------+-----------+----------+--------------+ PFV      Full                                                        +---------+---------------+---------+-----------+----------+--------------+ POP      Full           Yes      Yes                                 +---------+---------------+---------+-----------+----------+--------------+ PTV      Full                                                        +---------+---------------+---------+-----------+----------+--------------+ PERO     Full                                                        +---------+---------------+---------+-----------+----------+--------------+     Summary: RIGHT: - There is no evidence of deep vein thrombosis in the lower extremity.  - No cystic structure found in the popliteal fossa.  LEFT: - There is no evidence of deep vein thrombosis in the lower extremity.  - No cystic structure found in the popliteal fossa.  *See table(s) above for measurements and observations.    Preliminary      Eulis Foster, MD 08/23/2020, 11:49 PM PGY-2, [place] Intern pager: [phone], text pages welcome

## 2020-08-23 NOTE — ED Notes (Signed)
Trop 114.  Dr. Ashok Cordia notified.

## 2020-08-23 NOTE — Progress Notes (Signed)
Bilateral lower extremity venous duplex has been completed. Preliminary results can be found in CV Proc through chart review.  Results were given to Dr. Johnney Killian.  08/23/20 3:13 PM [name] RVT

## 2020-08-24 ENCOUNTER — Observation Stay (HOSPITAL_BASED_OUTPATIENT_CLINIC_OR_DEPARTMENT_OTHER): Payer: Medicare PPO

## 2020-08-24 ENCOUNTER — Observation Stay (HOSPITAL_COMMUNITY): Payer: Medicare PPO

## 2020-08-24 DIAGNOSIS — R4189 Other symptoms and signs involving cognitive functions and awareness: Secondary | ICD-10-CM | POA: Diagnosis not present

## 2020-08-24 DIAGNOSIS — R600 Localized edema: Secondary | ICD-10-CM

## 2020-08-24 DIAGNOSIS — R609 Edema, unspecified: Secondary | ICD-10-CM

## 2020-08-24 DIAGNOSIS — I10 Essential (primary) hypertension: Secondary | ICD-10-CM

## 2020-08-24 DIAGNOSIS — R079 Chest pain, unspecified: Secondary | ICD-10-CM

## 2020-08-24 DIAGNOSIS — Z602 Problems related to living alone: Secondary | ICD-10-CM

## 2020-08-24 DIAGNOSIS — M2012 Hallux valgus (acquired), left foot: Secondary | ICD-10-CM | POA: Diagnosis not present

## 2020-08-24 DIAGNOSIS — L03116 Cellulitis of left lower limb: Secondary | ICD-10-CM | POA: Diagnosis not present

## 2020-08-24 DIAGNOSIS — M7989 Other specified soft tissue disorders: Secondary | ICD-10-CM | POA: Diagnosis not present

## 2020-08-24 DIAGNOSIS — Z609 Problem related to social environment, unspecified: Secondary | ICD-10-CM

## 2020-08-24 DIAGNOSIS — R7989 Other specified abnormal findings of blood chemistry: Secondary | ICD-10-CM | POA: Diagnosis not present

## 2020-08-24 LAB — ECHOCARDIOGRAM COMPLETE
AR max vel: 4.07 cm2
AV Area VTI: 3.67 cm2
AV Area mean vel: 3.82 cm2
AV Mean grad: 2 mmHg
AV Peak grad: 4 mmHg
Ao pk vel: 1 m/s
Area-P 1/2: 4.17 cm2
Height: 74 in
S' Lateral: 4.5 cm
Weight: 2275.15 oz

## 2020-08-24 LAB — COMPREHENSIVE METABOLIC PANEL
ALT: 15 U/L (ref 0–44)
AST: 23 U/L (ref 15–41)
Albumin: 2.8 g/dL — ABNORMAL LOW (ref 3.5–5.0)
Alkaline Phosphatase: 33 U/L — ABNORMAL LOW (ref 38–126)
Anion gap: 7 (ref 5–15)
BUN: 12 mg/dL (ref 8–23)
CO2: 26 mmol/L (ref 22–32)
Calcium: 9 mg/dL (ref 8.9–10.3)
Chloride: 103 mmol/L (ref 98–111)
Creatinine, Ser: 1.25 mg/dL — ABNORMAL HIGH (ref 0.61–1.24)
GFR, Estimated: 58 mL/min — ABNORMAL LOW (ref >60)
Glucose, Bld: 145 mg/dL — ABNORMAL HIGH (ref 70–99)
Potassium: 2.8 mmol/L — ABNORMAL LOW (ref 3.5–5.1)
Sodium: 136 mmol/L (ref 135–145)
Total Bilirubin: 0.7 mg/dL (ref 0.3–1.2)
Total Protein: 6.5 g/dL (ref 6.5–8.1)

## 2020-08-24 LAB — TROPONIN I (HIGH SENSITIVITY)
Troponin I (High Sensitivity): 117 ng/L (ref <18)
Troponin I (High Sensitivity): 122 ng/L (ref <18)

## 2020-08-24 LAB — CBC
HCT: 32.8 % — ABNORMAL LOW (ref 39.0–52.0)
Hemoglobin: 10.3 g/dL — ABNORMAL LOW (ref 13.0–17.0)
MCH: 27.8 pg (ref 26.0–34.0)
MCHC: 31.4 g/dL (ref 30.0–36.0)
MCV: 88.4 fL (ref 80.0–100.0)
Platelets: 170 10*3/uL (ref 150–400)
RBC: 3.71 MIL/uL — ABNORMAL LOW (ref 4.22–5.81)
RDW: 12.4 % (ref 11.5–15.5)
WBC: 3.4 10*3/uL — ABNORMAL LOW (ref 4.0–10.5)
nRBC: 0 % (ref 0.0–0.2)

## 2020-08-24 LAB — GLUCOSE, CAPILLARY
Glucose-Capillary: 91 mg/dL (ref 70–99)
Glucose-Capillary: 94 mg/dL (ref 70–99)
Glucose-Capillary: 106 mg/dL — ABNORMAL HIGH (ref 70–99)
Glucose-Capillary: 133 mg/dL — ABNORMAL HIGH (ref 70–99)

## 2020-08-24 MED ORDER — INSULIN ASPART 100 UNIT/ML ~~LOC~~ SOLN: 0.0000 [IU] | Freq: Three times a day (TID) | SUBCUTANEOUS | Status: DC

## 2020-08-24 MED ORDER — POTASSIUM CHLORIDE 20 MEQ PO PACK
40.0000 meq | PACK | Freq: Every day | ORAL | Status: AC
  Administered 2020-08-24 – 2020-08-26 (×3): 40 meq via ORAL
  Filled 2020-08-24 (×3): qty 2

## 2020-08-24 MED ORDER — FUROSEMIDE 10 MG/ML IJ SOLN
40.0000 mg | Freq: Once | INTRAMUSCULAR | Status: AC
  Administered 2020-08-24: 40 mg via INTRAVENOUS
  Filled 2020-08-24: qty 4

## 2020-08-24 MED ORDER — RAMELTEON 8 MG PO TABS
8.0000 mg | ORAL_TABLET | Freq: Every day | ORAL | Status: DC
  Administered 2020-08-24 – 2020-08-26 (×3): 8 mg via ORAL
  Filled 2020-08-24 (×5): qty 1

## 2020-08-24 MED ORDER — FUROSEMIDE 10 MG/ML IJ SOLN: 40.0000 mg | Freq: Every day | INTRAMUSCULAR | Status: DC

## 2020-08-24 MED ORDER — POTASSIUM CHLORIDE 20 MEQ PO PACK
40.0000 meq | PACK | Freq: Every day | ORAL | Status: DC
  Filled 2020-08-24: qty 2

## 2020-08-24 MED ORDER — CEPHALEXIN 500 MG PO CAPS
500.0000 mg | ORAL_CAPSULE | Freq: Four times a day (QID) | ORAL | Status: DC
  Administered 2020-08-25 – 2020-08-27 (×11): 500 mg via ORAL
  Filled 2020-08-24 (×11): qty 1

## 2020-08-24 NOTE — Progress Notes (Signed)
Bed alarm activated, pt trying to get OOB without assistance. Pt denies needs, pt easily redirected and returned back bed by nursing

## 2020-08-24 NOTE — Evaluation (Signed)
Occupational Therapy Evaluation Patient Details Name: [name] MRN: [id number] DOB: 01-13-1941 Today's Date: 08/24/2020    History of Present Illness 80 y.o. male presenting with chest pain (resolved), left foot erythema in addition to bilateral lower extremity edema. +cellulitis,  elevated troponin and BNP; doppler negative for DVT bil;  LE; X-ray of Lt foot showed moderate soft tissue swelling distal foot, and hallux valgus with moderate 1st MTP degeneration. PMH is significant for tubulovillous adenoma, Mallory-Weiss tear, hypertension, hyperlipidemia, gout, diabetes, BPH, B12 deficiency.   Clinical Impression   Pt admitted with above. He demonstrates the below listed deficits and will benefit from continued OT to maximize safety and independence with BADLs.  Pt presents to OT with pain Lt LE, impaired balance, decreased activity tolerance, and significant cognitive impairment - he scored 24/28 on the Short Blessed Test which is indicative of significant impairment.  He is able to perform ADLs with min A, and functional mobility with min A, but did demonstrates LOB requiring assist to recover and prevent fall.   Per his report, and chart review, he lives alone.  He reports he is fully independent.  Recommend SNF level rehab to maximize his independence with ADLs and functional mobility with transition to either ALF or LTC if family unable to provide 24 hour assist.  Will follow acutely.       Follow Up Recommendations  SNF;Supervision/Assistance - 24 hour    Equipment Recommendations  None recommended by OT    Recommendations for Other Services       Precautions / Restrictions Precautions Precautions: Fall      Mobility Bed Mobility Overal bed mobility: Needs Assistance Bed Mobility: Sit to Supine;Supine to Sit     Supine to sit: Supervision Sit to supine: Supervision        Transfers Overall transfer level: Needs assistance Equipment used: Straight  cane Transfers: Sit to/from Stand;Stand Pivot Transfers Sit to Stand: Min guard Stand pivot transfers: Min assist       General transfer comment: mild LOB with turns requiring assist for balance    Balance Overall balance assessment: Needs assistance Sitting-balance support: Feet supported Sitting balance-Leahy Scale: Good     Standing balance support: Single extremity supported;During functional activity Standing balance-Leahy Scale: Poor Standing balance comment: requires UE support                           ADL either performed or assessed with clinical judgement   ADL Overall ADL's : Needs assistance/impaired Eating/Feeding: Independent   Grooming: Wash/dry hands;Wash/dry face;Oral care;Min guard;Standing   Upper Body Bathing: Set up;Supervision/ safety;Sitting   Lower Body Bathing: Minimal assistance;Sit to/from stand   Upper Body Dressing : Set up;Supervision/safety;Sitting   Lower Body Dressing: Minimal assistance;Sit to/from stand   Toilet Transfer: Minimal assistance;Ambulation;Comfort height toilet;Grab bars   Toileting- Clothing Manipulation and Hygiene: Minimal assistance;Sit to/from stand       Functional mobility during ADLs: Minimal assistance [facility])       Vision Patient Visual Report: No change from baseline       Perception Perception Perception Tested?: Yes   Praxis Praxis Praxis tested?: Within functional limits    Pertinent Vitals/Pain Pain Assessment: Faces Faces Pain Scale: Hurts little more Pain Location: Lt foot Pain Descriptors / Indicators: Aching Pain Intervention(s): Monitored during session     Hand Dominance Right   Extremity/Trunk Assessment Upper Extremity Assessment Upper Extremity Assessment: Overall WFL for tasks assessed   Lower Extremity Assessment  Lower Extremity Assessment: Defer to PT evaluation   Cervical / Trunk Assessment Cervical / Trunk Assessment: Normal   Communication  Communication Communication: HOH   Cognition   Behavior During Therapy: WFL for tasks assessed/performed Overall Cognitive Status: No family/caregiver present to determine baseline cognitive functioning                                 General Comments: Per chart review, pt has had cognitive dysfunction with concern for dementia.  This date he demonstrates deficits with attention (selective) - demonstrates significant difficulty with alternating and divided attention; deficits with problem solving, memory, and sequencing.  The Short Blessed Test was administered.  He scorerd 24/28 which is indicative of significant cognitive deficit   General Comments       Exercises     Shoulder Instructions      Home Living Family/patient expects to be discharged to:: Skilled nursing facility Living Arrangements: Alone   Type of Home: Apartment Home Access: Stairs to enter Entrance Stairs-Number of Steps: 1   Home Layout: One level     Bathroom Shower/Tub: Tub/shower unit         Home Equipment: Kasandra Knudsen - single point   Additional Comments: Pt reports he lives alone.  He reports he has 2 sons and a daughter but they don't assist him.  He denies other family members, however, there is mention of a sister and brother in the chart review.  Pt is a poor historian, and no family present      Prior Functioning/Environment Level of Independence: Independent with assistive device(s)        Comments: Pt reports he lives alone.  He denies assistance or that family check on him.  He reports he drives, grocery shops and does his own meal prep.  He is a poor historian and no family present        OT Problem List: Decreased activity tolerance;Impaired balance (sitting and/or standing);Decreased cognition;Decreased safety awareness;Decreased knowledge of use of DME or AE;Pain      OT Treatment/Interventions: Self-care/ADL training;DME and/or AE instruction;Therapeutic  activities;Cognitive remediation/compensation;Patient/family education;Balance training    OT Goals(Current goals can be found in the care plan section) Acute Rehab OT Goals Patient Stated Goal: for Lt LE to get back to normal OT Goal Formulation: With patient Time For Goal Achievement: 09/07/20 Potential to Achieve Goals: Good ADL Goals Pt Will Perform Grooming: with supervision;standing Pt Will Perform Lower Body Bathing: with supervision;sit to/from stand Pt Will Perform Lower Body Dressing: with supervision;sit to/from stand Pt Will Transfer to Toilet: with supervision;ambulating;regular height toilet;bedside commode;grab bars Pt Will Perform Toileting - Clothing Manipulation and hygiene: with supervision;sit to/from stand  OT Frequency: Min 2X/week   Barriers to D/C: Decreased caregiver support  lives alone       Co-evaluation              AM-PAC OT "6 Clicks" Daily Activity     Outcome Measure Help from another person eating meals?: None Help from another person taking care of personal grooming?: A Little Help from another person toileting, which includes using toliet, bedpan, or urinal?: A Little Help from another person bathing (including washing, rinsing, drying)?: A Little Help from another person to put on and taking off regular upper body clothing?: A Little Help from another person to put on and taking off regular lower body clothing?: A Little 6 Click Score: 19   End  of Session Equipment Utilized During Treatment: Other (comment) [facility]) Nurse Communication: Mobility status  Activity Tolerance: Patient limited by pain Patient left: in bed;with call bell/phone within reach;with bed alarm set  OT Visit Diagnosis: Unsteadiness on feet (R26.81);Cognitive communication deficit (R41.841)                Time: 2951-8841 OT Time Calculation (min): 21 min Charges:  OT General Charges $OT Visit: 1 Visit OT Evaluation $OT Eval Moderate Complexity: 1 Mod  Nilsa Nutting.,  OTR/L Acute Rehabilitation Services Pager [phone] Office [phone]   Lucille Passy M 08/24/2020, 9:20 AM

## 2020-08-24 NOTE — Progress Notes (Addendum)
Family Medicine Teaching Service Daily Progress Note Intern Pager: [phone]  Patient name: [name] Medical record number: [id number] Date of birth: 1940/07/10 Age: 80 y.o. Gender: male  Primary Care Provider: Richarda Osmond, DO Consultants: None Code Status: Full  Pt Overview and Major Events to Date:  3/19 : admitted with elevated trop, cellulitis and BLE  Assessment and Plan: Kunaal Walkins is a 80 y.o. male presenting with chest pain (resolved), elevated troponin and BNP found to have HFrEF, and LLE cellulitis . PMH is significant for tubulovillous adenoma, Mallory-Weiss tear, hypertension, hyperlipidemia, gout, diabetes, BPH, B12 deficiency.  Left lower extremity cellulitis Patient presents with left foot erythema in addition to bilateral lower extremity edema.  -S/p CTXx2  - transition to Keflex 500mg  QID for 6 days (3/20-)  HFrEF  Bilateral lower extremity edema  EF 20-35%, G1DD. Mild/Moderate aortic regurgitation and moderate aortic dilation.  3+ bilateral LE edema likely due to heart failure. S/p lasix 40mg  yesterday. UOP 3,171mL. Wt 140.65, down from 145.5 on admission.  - Cardiology consult in am, appreciate recommendations - Strict I's &Os, daily weights   - lasix 40 once   Dementia  On admission, patient oriented to self and reports living independently ambulating independently.  -We will plan to discuss with family and have patient follow-up with PCP for outpatient management  Leukopenia  WBC 3.5 on admission and PCP notes reporting some weight loss.  - AM CBC   Diabetes, well controlled CBG this am 120. Last hemoglobin A1c 5.6 on 08/12/2018 PCP noted that patient has not been compliant with medications at home.  Glucose on admission 112. - Con't to monitor   Hypertension Blood pressure elevated to max of 171/92.  Most recent blood pressure 156/83.  No home medications are recorded for hypertension. -Monitor blood pressure with  vitals -Recommend starting amlodipine 5 mg if blood pressure does not correct with pain management  Hyperlipidemia Last LDL measured on 08/11/2020.  Elevated at 147, HDL 38, triglycerides 55. Home meds include pravastatin  - continue pravastatin   History of gout Uric acid 6.2 on admission. No home medications in patient's chart for maintenance therapy.  - will monitor for improvement throughout hospitalization   FEN/GI: Heart healthy diet Prophylaxis: lovenox   Disposition: admit to medical telemetry   Subjective:  No acute events overnight. Patient sitting up in bed states he feels well. He tells me his name, where he is, and why he is in the hospital. He states he was living independently at home prior to coming to the hospital and hopes to go home after discharge. Denied any concerns or complaints.   Objective: Temp:  [98 F (36.7 C)-98.6 F (37 C)] 98.6 F (37 C) (03/20 1613) Pulse Rate:  [63-81] 71 (03/20 1613) Resp:  [14-20] 20 (03/20 1613) BP: (115-147)/(57-88) 147/88 (03/20 1613) SpO2:  [96 %-100 %] 100 % (03/20 1613) Weight:  [64.5 kg] 64.5 kg (03/20 0358) Physical Exam: General: alert, sitting comfortably in bed, NAD Cardiovascular: RRR no murmurs Respiratory: CTAB normal WOB Abdomen: soft, non distended. Non tender Extremities: trace edema bilaterally   Laboratory: Recent Labs  Lab 08/23/20 1142 08/24/20 0029  WBC 3.5* 3.4*  HGB 10.9* 10.3*  HCT 33.3* 32.8*  PLT 203 170   Recent Labs  Lab 08/23/20 1142 08/24/20 0029  NA 136 136  K 3.3* 2.8*  CL 103 103  CO2 26 26  BUN 11 12  CREATININE 1.17 1.25*  CALCIUM 9.2 9.0  PROT  --  6.5  BILITOT  --  0.7  ALKPHOS  --  33*  ALT  --  15  AST  --  23  GLUCOSE 112* 145*     Imaging/Diagnostic Tests: ECHOCARDIOGRAM COMPLETE  Result Date: 08/24/2020    ECHOCARDIOGRAM REPORT   Patient Name:   [name] Date of Exam: 08/24/2020 Medical Rec #:  [id number]       Height:       74.0 in Accession #:     [id number]      Weight:       142.2 lb Date of Birth:  01-May-1941       BSA:          1.880 m Patient Age:    18 years        BP:           136/83 mmHg Patient Gender: M               HR:           76 bpm. Exam Location:  Inpatient Procedure: 2D Echo, Cardiac Doppler and Color Doppler Indications:    Elevated troponin  History:        Patient has no prior history of Echocardiogram examinations.  Sonographer:    Merrie Roof RDCS Referring Phys: [place]  1. Left ventricular ejection fraction, by estimation, is 20 to 25%. The left ventricle has severely decreased function. The left ventricle demonstrates regional wall motion abnormalities (see scoring diagram/findings for description). Left ventricular diastolic parameters are consistent with Grade I diastolic dysfunction (impaired relaxation).  2. Right ventricular systolic function is mildly reduced. The right ventricular size is normal. Tricuspid regurgitation signal is inadequate for assessing PA pressure.  3. Left atrial size was moderately dilated.  4. There is a trivial pericardial effusion posterior to the left ventricle.  5. The mitral valve is grossly normal. Mild mitral valve regurgitation.  6. The aortic valve is tricuspid. Aortic valve regurgitation is mild to moderate. Aortic valve mean gradient measures 2.0 mmHg.  7. Aortic dilatation noted. There is moderate dilatation of the aortic root, measuring 47 mm. There is mild dilatation of the ascending aorta, measuring 42 mm.  8. The inferior vena cava is normal in size with greater than 50% respiratory variability, suggesting right atrial pressure of 3 mmHg. FINDINGS  Left Ventricle: Left ventricular ejection fraction, by estimation, is 20 to 25%. The left ventricle has severely decreased function. The left ventricle demonstrates regional wall motion abnormalities. The left ventricular internal cavity size was normal  in size. There is borderline left ventricular hypertrophy. Left  ventricular diastolic parameters are consistent with Grade I diastolic dysfunction (impaired relaxation).  LV Wall Scoring: The inferior septum, entire inferior wall, and mid inferolateral segment are akinetic. The entire anterior wall, antero-lateral wall, anterior septum, basal inferolateral segment, apical lateral segment, and apex are hypokinetic. Right Ventricle: The right ventricular size is normal. No increase in right ventricular wall thickness. Right ventricular systolic function is mildly reduced. Tricuspid regurgitation signal is inadequate for assessing PA pressure. Left Atrium: Left atrial size was moderately dilated. Right Atrium: Right atrial size was normal in size. Pericardium: Trivial pericardial effusion is present. The pericardial effusion is posterior to the left ventricle. Mitral Valve: The mitral valve is grossly normal. There is mild calcification of the mitral valve leaflet(s). Mild mitral valve regurgitation. Tricuspid Valve: The tricuspid valve is grossly normal. Tricuspid valve regurgitation is trivial. Aortic Valve: The aortic valve is tricuspid. Aortic valve  regurgitation is mild to moderate. Aortic valve mean gradient measures 2.0 mmHg. Aortic valve peak gradient measures 4.0 mmHg. Aortic valve area, by VTI measures 3.67 cm. Pulmonic Valve: The pulmonic valve was grossly normal. Pulmonic valve regurgitation is trivial. Aorta: Aortic dilatation noted. There is moderate dilatation of the aortic root, measuring 47 mm. There is mild dilatation of the ascending aorta, measuring 42 mm. Venous: The inferior vena cava is normal in size with greater than 50% respiratory variability, suggesting right atrial pressure of 3 mmHg. IAS/Shunts: No atrial level shunt detected by color flow Doppler.  LEFT VENTRICLE PLAX 2D LVIDd:         4.70 cm  Diastology LVIDs:         4.50 cm  LV e' medial:    4.03 cm/s LV PW:         1.10 cm  LV E/e' medial:  11.3 LV IVS:        0.90 cm  LV e' lateral:   5.77 cm/s  LVOT diam:     2.90 cm  LV E/e' lateral: 7.9 LV SV:         73 LV SV Index:   39 LVOT Area:     6.61 cm                          3D Volume EF:                         3D EF:        33 %                         LV EDV:       200 ml                         LV ESV:       134 ml                         LV SV:        66 ml RIGHT VENTRICLE          IVC RV Basal diam:  2.90 cm  IVC diam: 1.80 cm LEFT ATRIUM              Index       RIGHT ATRIUM           Index LA diam:        3.70 cm  1.97 cm/m  RA Area:     14.90 cm LA Vol (A2C):   120.0 ml 63.82 ml/m RA Volume:   36.50 ml  19.41 ml/m LA Vol (A4C):   70.1 ml  37.28 ml/m LA Biplane Vol: 97.3 ml  51.75 ml/m  AORTIC VALVE AV Area (Vmax):    4.07 cm AV Area (Vmean):   3.82 cm AV Area (VTI):     3.67 cm AV Vmax:           99.50 cm/s AV Vmean:          68.200 cm/s AV VTI:            0.200 m AV Peak Grad:      4.0 mmHg AV Mean Grad:      2.0 mmHg LVOT Vmax:         61.30 cm/s LVOT Vmean:  39.400 cm/s LVOT VTI:          0.111 m LVOT/AV VTI ratio: 0.55  AORTA Ao Root diam: 4.70 cm Ao Asc diam:  4.20 cm MITRAL VALVE MV Area (PHT): 4.17 cm    SHUNTS MV Decel Time: 182 msec    Systemic VTI:  0.11 m MV E velocity: 45.40 cm/s  Systemic Diam: 2.90 cm MV A velocity: 68.10 cm/s MV E/A ratio:  0.67 Rozann Lesches MD Electronically signed by Rozann Lesches MD Signature Date/Time: 08/24/2020/5:07:45 PM    Final     Shary Key, DO 08/24/2020, 9:56 PM PGY-1, [place] Intern pager: [phone], text pages welcome

## 2020-08-24 NOTE — Progress Notes (Signed)
  Echocardiogram 2D Echocardiogram has been performed.  Merrie Roof F 08/24/2020, 1:01 PM

## 2020-08-24 NOTE — Evaluation (Signed)
Physical Therapy Evaluation Patient Details Name: [name] MRN: [id number] DOB: [birth year] Today's Date: 08/24/2020   History of Present Illness  80 y.o. male presenting with chest pain (resolved), left foot erythema in addition to bilateral lower extremity edema. +cellulitis,  elevated troponin and BNP; doppler negative for DVT bil;  LE; X-ray of Lt foot showed moderate soft tissue swelling distal foot, and hallux valgus with moderate 1st MTP degeneration. PMH is significant for tubulovillous adenoma, Mallory-Weiss (esophageal) tear, hypertension, hyperlipidemia, gout, diabetes, BPH, B12 deficiency.  Clinical Impression   Pt admitted with above diagnosis. Patient living alone PTA, however per chart has been having issues with memory. Patient currently with antalgic gait due to LLE pain which is impacting his balance and he required min assist to prevent backwards fall x 1 during session.  Pt currently with functional limitations due to the deficits listed below (see PT Problem List). Pt will benefit from skilled PT to increase their independence and safety with mobility to allow discharge to the venue listed below.       Follow Up Recommendations SNF;Supervision/Assistance - 24 hour; home only if family can provide 24/7 supervision (which per previous records they cannot)    Equipment Recommendations  Rolling walker with 5" wheels    Recommendations for Other Services       Precautions / Restrictions Precautions Precautions: Fall      Mobility  Bed Mobility Overal bed mobility: Needs Assistance Bed Mobility: Sit to Supine;Supine to Sit     Supine to sit: Supervision Sit to supine: Supervision        Transfers Overall transfer level: Needs assistance Equipment used: Straight cane Transfers: Sit to/from Stand Sit to Stand: Min assist Stand pivot transfers: Min assist       General transfer comment: leaning posterior on sit to stand with legs braced against  bedframe  Ambulation/Gait Ambulation/Gait assistance: Min assist Gait Distance (Feet): 180 Feet Assistive device: Straight cane Gait Pattern/deviations: Step-through pattern;Step-to pattern;Decreased stride length;Decreased stance time - left;Decreased weight shift to left;Antalgic        Stairs            Wheelchair Mobility    Modified Rankin (Stroke Patients Only)       Balance Overall balance assessment: Needs assistance Sitting-balance support: Feet supported Sitting balance-Leahy Scale: Good     Standing balance support: Single extremity supported;During functional activity Standing balance-Leahy Scale: Poor Standing balance comment: requires UE support     Tandem Stance - Right Leg:  (min assist to attain position; held x 10 sec) Tandem Stance - Left Leg:  (min assist to attain position and unabel to hold without assist) [place] - Eyes Opened:  (minguard assist with incr sway) [place] - Eyes Closed:  (unable--immediately leans to his left and opens eyes)                 Pertinent Vitals/Pain Pain Assessment: Faces Faces Pain Scale: Hurts little more Pain Location: Lt foot Pain Descriptors / Indicators: Aching Pain Intervention(s): Limited activity within patient's tolerance;Monitored during session    Home Living Family/patient expects to be discharged to:: Skilled nursing facility Living Arrangements: Alone   Type of Home: Apartment Home Access: Stairs to enter   Entrance Stairs-Number of Steps: 1 Home Layout: One level Home Equipment: Cane - single point Additional Comments: Pt reports he lives alone.  He reports he has 2 sons and a daughter but they don't assist him.  He denies other family members, however, there is mention  of a sister and brother in the chart review.  Pt is a poor historian, and no family present    Prior Function Level of Independence: Independent with assistive device(s)         Comments: Pt reports he lives  alone.  He denies assistance or that family check on him.  He reports he drives, grocery shops and does his own meal prep.  He is a poor historian and no family present     Hand Dominance   Dominant Hand: Right    Extremity/Trunk Assessment   Upper Extremity Assessment Upper Extremity Assessment: Defer to OT evaluation    Lower Extremity Assessment Lower Extremity Assessment: LLE deficits/detail LLE Deficits / Details: edematous foot and ankle with pt reporting +soreness/pain with walking    Cervical / Trunk Assessment Cervical / Trunk Assessment: Normal  Communication   Communication: HOH  Cognition Arousal/Alertness: Awake/alert Behavior During Therapy: WFL for tasks assessed/performed Overall Cognitive Status: No family/caregiver present to determine baseline cognitive functioning                                 General Comments: Per chart review, pt has had cognitive dysfunction with concern for dementia.  This date he demonstrates deficits with attention (selective) - demonstrates significant difficulty with alternating and divided attention; deficits with problem solving, memory, and sequencing.      General Comments      Exercises     Assessment/Plan    PT Assessment Patient needs continued PT services  PT Problem List Decreased activity tolerance;Decreased balance;Decreased mobility;Decreased cognition;Decreased knowledge of use of DME;Decreased safety awareness;Pain       PT Treatment Interventions DME instruction;Gait training;Functional mobility training;Therapeutic activities;Therapeutic exercise;Balance training;Cognitive remediation;Patient/family education    PT Goals (Current goals can be found in the Care Plan section)  Acute Rehab PT Goals Patient Stated Goal: for Lt LE to get back to normal PT Goal Formulation: With patient Time For Goal Achievement: 09/07/20 Potential to Achieve Goals: Good    Frequency Min 3X/week   Barriers to  discharge Decreased caregiver support      Co-evaluation               AM-PAC PT "6 Clicks" Mobility  Outcome Measure Help needed turning from your back to your side while in a flat bed without using bedrails?: None Help needed moving from lying on your back to sitting on the side of a flat bed without using bedrails?: None Help needed moving to and from a bed to a chair (including a wheelchair)?: A Little Help needed standing up from a chair using your arms (e.g., wheelchair or bedside chair)?: A Little Help needed to walk in hospital room?: A Little Help needed climbing 3-5 steps with a railing? : A Little 6 Click Score: 20    End of Session Equipment Utilized During Treatment: Gait belt Activity Tolerance: Patient limited by pain Patient left: in chair;with call bell/phone within reach;with chair alarm set Nurse Communication: Mobility status PT Visit Diagnosis: Unsteadiness on feet (R26.81);Difficulty in walking, not elsewhere classified (R26.2)    Time: 0258-5277 PT Time Calculation (min) (ACUTE ONLY): 17 min   Charges:   PT Evaluation $PT Eval Low Complexity: 1 Low           Arby Barrette, PT Pager [phone]   Rexanne Mano 08/24/2020, 11:01 AM

## 2020-08-24 NOTE — TOC Initial Note (Addendum)
Transition of Care [facility]) - Initial/Assessment Note    Patient Details  Name: [name] MRN: [id number] Date of Birth: 1940/11/25  [facility]) CM/SW Contact:    Oretha Milch, LCSW Phone Number: 08/24/2020, 12:50 PM  Clinical Narrative: CSW received consult for SNF/HH follow-up pending PT/OT recommendations. CSW noted PT recommendation for skilled nursing care. CSW met with patient and inquired into status. Patient was noted to have some memory impairment but presented as orientated during evaluation. CSW informed patient of PT recommendations to build strength up to support independent living. CSW noted patient kept asking if it had anything to do with his heart and CSW inquired into his ability to be independent at home. Ultimately, CSW noted patient preferred to go home and felt his ambulation was more due to his age rather than needing PT and declined SNF and HH follow-up. CSW encouraged patient and noted he affirmed he felt he would be fine. CSW noted patient did report he will reach out if he changes his mind and accepted information on SNF and [place] treatment. Please reach out to [facility] for additional discharge supports/needs.         1:02p: CSW updated treatment team of denial of SNF or HH and that to continue forward would require a capacity evaluation/physician determination he is unable to make his own decisions.        Expected Discharge Plan: Home/Self Care Barriers to Discharge: Continued Medical Work up,Other (comment) (Declining recommendation for SNF)   Patient Goals and CMS Choice Patient states their goals for this hospitalization and ongoing recovery are:: "I feel I do fine. I want to make sure I'm okay and go home."   Choice offered to / list presented to : Patient  Expected Discharge Plan and Services Expected Discharge Plan: Home/Self Care     Post Acute Care Choice: NA Living arrangements for the past 2 months: Single Family Home                                       Prior Living Arrangements/Services Living arrangements for the past 2 months: Single Family Home Lives with:: Self Patient language and need for interpreter reviewed:: Yes Do you feel safe going back to the place where you live?: Yes      Need for Family Participation in Patient Care: No (Comment) Care giver support system in place?: No (comment)   Criminal Activity/Legal Involvement Pertinent to Current Situation/Hospitalization: No - Comment as needed  Activities of Daily Living Home Assistive Devices/Equipment: (P) Cane (specify quad or straight) (Straight) ADL Screening (condition at time of admission) Patient's cognitive ability adequate to safely complete daily activities?: (P) Yes Is the patient deaf or have difficulty hearing?: (P) No Does the patient have difficulty seeing, even when wearing glasses/contacts?: (P) No Does the patient have difficulty concentrating, remembering, or making decisions?: (P) No Patient able to express need for assistance with ADLs?: (P) Yes Does the patient have difficulty dressing or bathing?: (P) No Independently performs ADLs?: (P) Yes (appropriate for developmental age) Does the patient have difficulty walking or climbing stairs?: (P) Yes Weakness of Legs: (P) Both Weakness of Arms/Hands: (P) None  Permission Sought/Granted Permission sought to share information with : Facility Art therapist granted to share information with : No              Emotional Assessment Appearance:: Appears stated age Attitude/Demeanor/Rapport:  Engaged Affect (typically observed): Anxious,Apprehensive Orientation: : Oriented to Self,Oriented to Place,Oriented to  Time,Oriented to Situation (Some noted memory impairment, not noted upon assessment) Alcohol / Substance Use: Not Applicable Psych Involvement: No (comment)  Admission diagnosis:  Cellulitis [L03.90] Peripheral edema [R60.9] Elevated troponin  [R77.8] Troponin level elevated [R77.8] Cellulitis of left lower extremity [L03.116] Patient Active Problem List   Diagnosis Date Noted  . Cellulitis 08/23/2020  . Elevated troponin 08/23/2020  . History of dementia 08/11/2020  . Murmur, cardiac 08/11/2020  . Episodic tension-type headache, not intractable 12/07/2019  . Swelling around both eyes 07/18/2019  . Weight loss 02/06/2019  . B12 deficiency 07/21/2017  . Osteoarthritis 05/12/2017  . Hyperlipidemia 12/20/2016  . Memory loss 07/06/2016  . Insomnia 01/09/2015  . Diabetes mellitus ([place]) 06/05/2014  . 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 ANEMIA-IRON DEFICIENCY 07/07/2009  . Essential hypertension 04/21/2007  . Chronic urticaria 04/21/2007   PCP:  Richarda Osmond, DO Pharmacy:   [id number] [place], [id number]: [phone]: [phone]    Social Determinants of Health (SDOH) Interventions    Readmission Risk Interventions No flowsheet data found.

## 2020-08-24 NOTE — Progress Notes (Addendum)
Family Medicine Teaching Service Daily Progress Note Intern Pager: [phone]  Patient name: [name] Medical record number: [id number] Date of birth: 09-26-1940 Age: 80 y.o. Gender: male  Primary Care Provider: Richarda Osmond, DO Consultants: TOC Code Status: FULL   Pt Overview and Major Events to Date:  3/19 : admitted with elevated trop, cellulitis and BLE   Assessment and Plan:  [name] is a 80 year old male p/w bilateral LE edema, elevated troponin, elevated BNP and left foot cellulitis with hx of HTN, HLD, dementia, and DM.   Left lower extremity cellulitis Patient afebrile overnight.  Pain is improved subjectively.  Patient appears to have decreased redness in his foot and decreased tenderness to palpation.  Lower extremity ultrasound negative for DVT on admission. -Patient is status post 1 dose ceftriaxone -Plan to transition to oral Keflex 500 if patient remains afebrile today -Continue to monitor fever curve - left foot xray   Elevated troponin, elevated BNP, resolved Patient lower extremity edema greatly improved from admission.  Patient with 1.4 L urine output overnight with 40 IV Lasix.  Echocardiogram ordered, will follow up as available to assess for potential causes of lower extremity edema. Troponin trended downward from 1 22-1 17. -We will stop trending troponin -We will repeat EKG -Continue cardiac monitoring -will plan for today's lasix dose to be last dose    Diabetes, stable and controlled Diet controlled Blood glucose increased to 145 on BMP today. -Very sensitive sliding scale -CBG monitoring  Hypokalemia  K 3.3. on admission and down to 2.8 on recheck this AM.  - will replenish with PO K, need monitoring as patient on Lasix - monitor BMP   AKI Most likely pre-renal, given elevated BNP patient may have HF contributing to decreased renal perfusion as well as bilateral LE.  - monitor Cr daily  - on lasix for edema, good UOP  overnight   Dementia We will have patient evaluated by physical therapy and Occupational Therapy -We will appreciate recommendations for disposition  upon discharge  Hyperlipidemia Continue home pravastatin  FEN/GI: heart health diet  PPx: lovenox    Status is: Observation  The patient remains OBS appropriate and will d/c before 2 midnights.  Dispo: The patient is from: Pending recommendations from PT/OT              Anticipated d/c is to: SNF              Patient currently is not medically stable to d/c.   Difficult to place patient No  Subjective:  Patient reports that his foot pain has improved greatly.  Patient denies feeling much better.  He has no new complaints this morning.  He continues to have no chest pain and no difficulty breathing.  Objective: Temp:  [98 F (36.7 C)-98.6 F (37 C)] 98.6 F (37 C) (03/20 0358) Pulse Rate:  [57-88] 69 (03/20 0358) Resp:  [12-20] 14 (03/20 0358) BP: (115-171)/(67-107) 125/67 (03/20 0358) SpO2:  [96 %-99 %] 98 % (03/20 0358) Weight:  [64.5 kg-66 kg] 64.5 kg (03/20 0358)  Physical Exam: General: Elderly male, calmly sitting upright in bed, watching television Cardiovascular: Rhythm appears to be regular rhythm and rate, do not appreciate any murmurs Respiratory: Clear to auscultation, no wheezing, no crackles, breathing comfortably on room air Abdomen: Abdomen is soft and nontender to palpation Extremities: Improve lower extremity edema bilaterally, decreased tenderness and redness in left foot  Laboratory: Recent Labs  Lab 08/23/20 1142 08/24/20 0029  WBC 3.5*  3.4*  HGB 10.9* 10.3*  HCT 33.3* 32.8*  PLT 203 170   Recent Labs  Lab 08/23/20 1142 08/24/20 0029  NA 136 136  K 3.3* 2.8*  CL 103 103  CO2 26 26  BUN 11 12  CREATININE 1.17 1.25*  CALCIUM 9.2 9.0  PROT  --  6.5  BILITOT  --  0.7  ALKPHOS  --  33*  ALT  --  15  AST  --  23  GLUCOSE 112* 145*    Imaging/Diagnostic Tests: No new imaging    Eulis Foster, MD 08/24/2020, 6:43 AM PGY-2, [facility] Intern pager: [phone], text pages welcome

## 2020-08-25 ENCOUNTER — Other Ambulatory Visit: Payer: Self-pay

## 2020-08-25 DIAGNOSIS — E785 Hyperlipidemia, unspecified: Secondary | ICD-10-CM | POA: Diagnosis present

## 2020-08-25 DIAGNOSIS — E876 Hypokalemia: Secondary | ICD-10-CM | POA: Diagnosis present

## 2020-08-25 DIAGNOSIS — Z9114 Patient's other noncompliance with medication regimen: Secondary | ICD-10-CM | POA: Diagnosis not present

## 2020-08-25 DIAGNOSIS — N1831 Chronic kidney disease, stage 3a: Secondary | ICD-10-CM | POA: Diagnosis present

## 2020-08-25 DIAGNOSIS — Z888 Allergy status to other drugs, medicaments and biological substances status: Secondary | ICD-10-CM | POA: Diagnosis not present

## 2020-08-25 DIAGNOSIS — I351 Nonrheumatic aortic (valve) insufficiency: Secondary | ICD-10-CM | POA: Diagnosis present

## 2020-08-25 DIAGNOSIS — I5021 Acute systolic (congestive) heart failure: Secondary | ICD-10-CM | POA: Diagnosis present

## 2020-08-25 DIAGNOSIS — F0390 Unspecified dementia without behavioral disturbance: Secondary | ICD-10-CM | POA: Diagnosis present

## 2020-08-25 DIAGNOSIS — Z8719 Personal history of other diseases of the digestive system: Secondary | ICD-10-CM | POA: Diagnosis not present

## 2020-08-25 DIAGNOSIS — Z87891 Personal history of nicotine dependence: Secondary | ICD-10-CM | POA: Diagnosis not present

## 2020-08-25 DIAGNOSIS — I5022 Chronic systolic (congestive) heart failure: Secondary | ICD-10-CM

## 2020-08-25 DIAGNOSIS — Z602 Problems related to living alone: Secondary | ICD-10-CM | POA: Diagnosis not present

## 2020-08-25 DIAGNOSIS — Z9049 Acquired absence of other specified parts of digestive tract: Secondary | ICD-10-CM | POA: Diagnosis not present

## 2020-08-25 DIAGNOSIS — M79606 Pain in leg, unspecified: Secondary | ICD-10-CM | POA: Diagnosis present

## 2020-08-25 DIAGNOSIS — L03116 Cellulitis of left lower limb: Secondary | ICD-10-CM | POA: Diagnosis present

## 2020-08-25 DIAGNOSIS — I429 Cardiomyopathy, unspecified: Secondary | ICD-10-CM | POA: Diagnosis present

## 2020-08-25 DIAGNOSIS — Z20822 Contact with and (suspected) exposure to covid-19: Secondary | ICD-10-CM | POA: Diagnosis present

## 2020-08-25 DIAGNOSIS — N179 Acute kidney failure, unspecified: Secondary | ICD-10-CM | POA: Diagnosis present

## 2020-08-25 DIAGNOSIS — E1122 Type 2 diabetes mellitus with diabetic chronic kidney disease: Secondary | ICD-10-CM | POA: Diagnosis present

## 2020-08-25 DIAGNOSIS — Z609 Problem related to social environment, unspecified: Secondary | ICD-10-CM | POA: Diagnosis not present

## 2020-08-25 DIAGNOSIS — Z86010 Personal history of colonic polyps: Secondary | ICD-10-CM | POA: Diagnosis not present

## 2020-08-25 DIAGNOSIS — E538 Deficiency of other specified B group vitamins: Secondary | ICD-10-CM | POA: Diagnosis present

## 2020-08-25 DIAGNOSIS — I130 Hypertensive heart and chronic kidney disease with heart failure and stage 1 through stage 4 chronic kidney disease, or unspecified chronic kidney disease: Secondary | ICD-10-CM | POA: Diagnosis present

## 2020-08-25 DIAGNOSIS — J9811 Atelectasis: Secondary | ICD-10-CM | POA: Diagnosis present

## 2020-08-25 DIAGNOSIS — N400 Enlarged prostate without lower urinary tract symptoms: Secondary | ICD-10-CM | POA: Diagnosis present

## 2020-08-25 DIAGNOSIS — Z79899 Other long term (current) drug therapy: Secondary | ICD-10-CM | POA: Diagnosis not present

## 2020-08-25 DIAGNOSIS — M109 Gout, unspecified: Secondary | ICD-10-CM | POA: Diagnosis present

## 2020-08-25 DIAGNOSIS — R4189 Other symptoms and signs involving cognitive functions and awareness: Secondary | ICD-10-CM | POA: Diagnosis not present

## 2020-08-25 DIAGNOSIS — D61818 Other pancytopenia: Secondary | ICD-10-CM | POA: Diagnosis present

## 2020-08-25 LAB — GLUCOSE, CAPILLARY
Glucose-Capillary: 120 mg/dL — ABNORMAL HIGH (ref 70–99)
Glucose-Capillary: 131 mg/dL — ABNORMAL HIGH (ref 70–99)
Glucose-Capillary: 99 mg/dL (ref 70–99)
Glucose-Capillary: 104 mg/dL — ABNORMAL HIGH (ref 70–99)

## 2020-08-25 LAB — BASIC METABOLIC PANEL
Anion gap: 6 (ref 5–15)
BUN: 13 mg/dL (ref 8–23)
CO2: 30 mmol/L (ref 22–32)
Calcium: 9.5 mg/dL (ref 8.9–10.3)
Chloride: 101 mmol/L (ref 98–111)
Creatinine, Ser: 1.37 mg/dL — ABNORMAL HIGH (ref 0.61–1.24)
GFR, Estimated: 52 mL/min — ABNORMAL LOW (ref >60)
Glucose, Bld: 112 mg/dL — ABNORMAL HIGH (ref 70–99)
Potassium: 3.5 mmol/L (ref 3.5–5.1)
Sodium: 137 mmol/L (ref 135–145)

## 2020-08-25 MED ORDER — ASPIRIN 81 MG PO CHEW
81.0000 mg | CHEWABLE_TABLET | Freq: Once | ORAL | Status: AC
  Administered 2020-08-25: 81 mg via ORAL
  Filled 2020-08-25: qty 1

## 2020-08-25 MED ORDER — FUROSEMIDE 10 MG/ML IJ SOLN
40.0000 mg | Freq: Once | INTRAMUSCULAR | Status: AC
  Administered 2020-08-25: 40 mg via INTRAVENOUS
  Filled 2020-08-25: qty 4

## 2020-08-25 MED ORDER — METOPROLOL SUCCINATE ER 25 MG PO TB24
25.0000 mg | ORAL_TABLET | Freq: Every day | ORAL | Status: DC
  Administered 2020-08-25 – 2020-08-27 (×3): 25 mg via ORAL
  Filled 2020-08-25 (×3): qty 1

## 2020-08-25 MED ORDER — ATORVASTATIN CALCIUM 80 MG PO TABS
80.0000 mg | ORAL_TABLET | Freq: Every day | ORAL | Status: DC
  Administered 2020-08-25 – 2020-08-26 (×2): 80 mg via ORAL
  Filled 2020-08-25 (×2): qty 1

## 2020-08-25 NOTE — Consult Note (Addendum)
Cardiology Consultation:   Patient ID: [name] MRN: [id number]; DOB: 30-Dec-1940  Admit date: 08/23/2020 Date of Consult: 08/25/2020  PCP:  Richarda Osmond, [facility]  Cardiologist:  New to [facility] Provider:  No care team member to display Electrophysiologist:  None   Patient Profile:   [name] is a 80 y.o. male with a PMH of HTN, HLD, DM type 2, Gout, BPH, and dementia, who is being seen today for the evaluation of acute CHF at the request of Dr. Andria Frames.  History of Present Illness:   Mr. Schar presented to the ED 08/23/20 with complaints of LE edema and left foot cellulitis. He reported issues with leg swelling and left foot pain for the past several weeks, which has progressed to the point that he is unable to put his shoes on prompting him to present to the ED for further evaluation.   He has no prior cardiac history and does not follow with cardiology outpatient. He saw his PCP 08/11/20 and was noted to have a murmur and LE edema on exam, for which an echocardiogram was ordered but not yet completed. He has no prior cardiac imaging or ischemic evaluations available to review in epic.   At the time of this evaluation, he reports improve in his LE edema since admission. He tells me he lives alone and is independent in his ADLs. Up until recently he was doing fine, however noticed increased LE edema over the past few weeks. He denies any recent chest pain, SOB, orthopnea, PND, weight gain, abdominal bloating, dizziness, lightheadedness, syncope, or palpitations. When discussing his echocardiogram results, he became agitated stating his heart has felt fine and he didn't understand why I was telling him he was having problems. He continued to sound surprised every time I mentioned his weak heart muscle suggesting he was not regaining the information, though he was A&O x3 at the time of my evaluation.   Hospital course:  BP intermittently elevated, otherwise VSS. Labs notable for K 3.3>2.8>3.5 after repletion, Cr 1.17>1.37, pancytopenic with Hgb 10.3, PLT 170. HsTrop 114>122>117, BNP 985. EKG showed sinus rhythm, rate 89 bpm, non-specific IVCD, non-specific ST-T wave abnormalities, overall no significant change from previous. CXR showed small left pleural effusion with associated atelectasis. XR foot showed moderate soft tissue swelling without evidence of osteomyelitis. He was admitted to medicine and started on IV>po antibiotics for cellulitis. He was started on IV lasix 40mg  daily. Echocardiogram 08/24/20 showed EF 20-25%, G1DD, with RWMA in the anterior, antero-lateral, anteroseptal, basal inferolateral, apical lateral, and apex hypokinesis, mildly reduced RV systolic function, moderate LAE, trivial pericardial effusion, mild MR, mild-moderate AI, and aortic root dilation 60mm. Cardiology asked to evaluate for acute combined CHF.  Past Medical History:  Diagnosis Date  . Allergic rhinitis   . Anemia   . Angio-edema   . B12 deficiency   . BPH (benign prostatic hyperplasia)    [place]  . Diabetes ([place])   . Gout   . Hyperlipidemia   . Hypertension   . Mallory-Weiss tear   . Thyroid disorder   . 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 Urticaria     Past Surgical History:  Procedure Laterality Date  . ADENOIDECTOMY    . CHOLECYSTECTOMY  1960  . COLON SURGERY     right hemi-colectomy  . COLONOSCOPY  multiple  . ESOPHAGOGASTRODUODENOSCOPY    . TONSILLECTOMY       Home Medications:  Prior  to Admission medications   Medication Sig Start Date End Date Taking? Authorizing Provider  pravastatin (PRAVACHOL) 80 MG tablet Take 1 tablet (80 mg total) by mouth daily. 08/15/20  Yes Richarda Osmond, DO    Inpatient Medications: Scheduled Meds: . cephALEXin  500 mg Oral Q6H  . enoxaparin (LOVENOX) injection  40 mg Subcutaneous Q24H  . insulin aspart  0-6 Units Subcutaneous TID WC  . potassium chloride  40  mEq Oral Daily  . pravastatin  80 mg Oral Daily  . ramelteon  8 mg Oral QHS   Continuous Infusions:  PRN Meds: acetaminophen **OR** acetaminophen  Allergies:    Allergies  Allergen Reactions  . Ace Inhibitors Swelling and Other (See Comments)    Site of swelling not known by the patient  . Valsartan Swelling and Other (See Comments)    Site of swelling not known by the patient    Social History:   Social History   Socioeconomic History  . Marital status: Divorced    Spouse name: Not on file  . Number of children: 3  . Years of education: 71  . Highest education level: High school graduate  Occupational History  . Occupation: retired    Comment: Administrator   Tobacco Use  . Smoking status: Former Smoker    Packs/day: 1.00    Years: 30.00    Pack years: 30.00    Types: Cigarettes    Quit date: 06/07/2001    Years since quitting: 19.2  . Smokeless tobacco: Former Systems developer    Types: Chew    Quit date: 06/07/2001  Vaping Use  . Vaping Use: Never used  Substance and Sexual Activity  . Alcohol use: No    Alcohol/week: 0.0 standard drinks    Comment: quit about 10 years ago   . Drug use: No  . Sexual activity: Yes    Partners: Female  Other Topics Concern  . Not on file  Social History Narrative   Lives in [place] alone   [place]: hangs out socially    Patient enjoys walking around the community to stay active.      Right handed   Caffeine: none    Social Determinants of Health   Financial Resource Strain: Not on file  Food Insecurity: Not on file  Transportation Needs: Not on file  Physical Activity: Not on file  Stress: Not on file  Social Connections: Not on file  Intimate Partner Violence: Not on file    Family History:    Family History  Problem Relation Age of Onset  . Diabetes Other        nephew  . Colon cancer Neg Hx      ROS:  Please see the history of present illness.   All other ROS reviewed and negative.     Physical Exam/Data:    Vitals:   08/24/20 1613 08/24/20 2249 08/25/20 0439 08/25/20 1220  BP: (!) 147/88 (!) 146/89 (!) 143/81 (!) 127/94  Pulse: 71 79 66 80  Resp: 20 18 18 16   Temp: 98.6 F (37 C) 98.2 F (36.8 C) 98.3 F (36.8 C) 97.7 F (36.5 C)  TempSrc: Oral Oral Oral Oral  SpO2: 100% 100% 98% 100%  Weight:   63.8 kg   Height:        Intake/Output Summary (Last 24 hours) at 08/25/2020 1254 Last data filed at 08/25/2020 0559 Gross per 24 hour  Intake 480 ml  Output 2650 ml  Net -2170 ml  Last 3 Weights 08/25/2020 08/24/2020 08/23/2020  Weight (lbs) 140 lb 10.5 oz 142 lb 3.2 oz 145 lb 8.1 oz  Weight (kg) 63.8 kg 64.5 kg 66 kg     Body mass index is 18.06 kg/m.  General:  Thin elderly gentleman, laying in bed in NAD HEENT: sclera anicteric Neck: no JVD Vascular: No carotid bruits; distal pulses 2+ bilaterally Cardiac:  normal S1, S2; RRR; +murmur, no rubs or gallops Lungs:  clear to auscultation bilaterally, no wheezing, rhonchi or rales  Abd: soft, nontender, no hepatomegaly  Ext: trace-1+ LLE edema, no RLE edema Musculoskeletal:  No deformities, BUE and BLE strength normal and equal Skin: warm and dry  Neuro:  CNs 2-12 intact, no focal abnormalities noted Psych:  Normal affect   EKG:  The EKG was personally reviewed and demonstrates:  sinus rhythm, rate 89 bpm, non-specific IVCD, non-specific ST-T wave abnormalities, overall no significant change from previous. Telemetry:  Telemetry was personally reviewed and demonstrates:  Sinus rhythm with occasional PVCs  Relevant CV Studies: Echocardiogram 08/24/20: 1. Left ventricular ejection fraction, by estimation, is 20 to 25%. The  left ventricle has severely decreased function. The left ventricle  demonstrates regional wall motion abnormalities (see scoring  diagram/findings for description). Left ventricular  diastolic parameters are consistent with Grade I diastolic dysfunction  (impaired relaxation).  2. Right ventricular systolic  function is mildly reduced. The right  ventricular size is normal. Tricuspid regurgitation signal is inadequate  for assessing PA pressure.  3. Left atrial size was moderately dilated.  4. There is a trivial pericardial effusion posterior to the left  ventricle.  5. The mitral valve is grossly normal. Mild mitral valve regurgitation.  6. The aortic valve is tricuspid. Aortic valve regurgitation is mild to  moderate. Aortic valve mean gradient measures 2.0 mmHg.  7. Aortic dilatation noted. There is moderate dilatation of the aortic  root, measuring 47 mm. There is mild dilatation of the ascending aorta,  measuring 42 mm.  8. The inferior vena cava is normal in size with greater than 50%  respiratory variability, suggesting right atrial pressure of 3 mmHg.   Laboratory Data:  High Sensitivity Troponin:   Recent Labs  Lab 08/23/20 1142 08/23/20 1505 08/23/20 2331 08/24/20 0041  TROPONINIHS 114* 122* 122* 117*     Chemistry Recent Labs  Lab 08/23/20 1142 08/24/20 0029 08/25/20 0343  NA 136 136 137  K 3.3* 2.8* 3.5  CL 103 103 101  CO2 26 26 30   GLUCOSE 112* 145* 112*  BUN 11 12 13   CREATININE 1.17 1.25* 1.37*  CALCIUM 9.2 9.0 9.5  GFRNONAA >60 58* 52*  ANIONGAP 7 7 6     Recent Labs  Lab 08/24/20 0029  PROT 6.5  ALBUMIN 2.8*  AST 23  ALT 15  ALKPHOS 33*  BILITOT 0.7   Hematology Recent Labs  Lab 08/23/20 1142 08/24/20 0029  WBC 3.5* 3.4*  RBC 3.77* 3.71*  HGB 10.9* 10.3*  HCT 33.3* 32.8*  MCV 88.3 88.4  MCH 28.9 27.8  MCHC 32.7 31.4  RDW 12.5 12.4  PLT 203 170   BNP Recent Labs  Lab 08/23/20 1142  BNP 985.6*    DDimer No results for input(s): DDIMER in the last 168 hours.   Radiology/Studies:  DG Chest 2 View  Result Date: 08/23/2020 CLINICAL DATA:  Chest pain, leg swelling for 3 days EXAM: CHEST - 2 VIEW COMPARISON:  Chest radiograph dated 01/30/2010 FINDINGS: The heart is enlarged. There is a small left  pleural effusion with  associated atelectasis. The right lung is clear with no pleural effusion. There is no pneumothorax. The visualized skeletal structures are unremarkable. IMPRESSION: Small left pleural effusion with associated atelectasis. Electronically Signed   By: Zerita Boers M.D.   On: 08/23/2020 12:53   DG Foot 2 Views Left  Result Date: 08/24/2020 CLINICAL DATA:  80 year old male with left foot swelling and pain. EXAM: LEFT FOOT - 2 VIEW COMPARISON:  None. FINDINGS: Moderate soft tissue swelling in the distal left foot. No soft tissue gas. No radiopaque foreign body identified. Hallux valgus and metatarsus primus varus with moderate 1st MTP joint space loss and degeneration. No acute fracture or dislocation. No cortical osteolysis. Other joint spaces appear normal for age. IMPRESSION: 1. Moderate soft tissue swelling in the distal left foot with no soft tissue gas or radiographic evidence of osteomyelitis. 2. Hallux valgus with moderate 1st MTP degeneration. Electronically Signed   By: Genevie Ann M.D.   On: 08/24/2020 08:36   ECHOCARDIOGRAM COMPLETE  Result Date: 08/24/2020    ECHOCARDIOGRAM REPORT   Patient Name:   [name] Date of Exam: 08/24/2020 Medical Rec #:  [id number]       Height:       74.0 in Accession #:    [id number]      Weight:       142.2 lb Date of Birth:  08/18/1940       BSA:          1.880 m Patient Age:    89 years        BP:           136/83 mmHg Patient Gender: M               HR:           76 bpm. Exam Location:  Inpatient Procedure: 2D Echo, Cardiac Doppler and Color Doppler Indications:    Elevated troponin  History:        Patient has no prior history of Echocardiogram examinations.  Sonographer:    Merrie Roof RDCS Referring Phys: [place]  1. Left ventricular ejection fraction, by estimation, is 20 to 25%. The left ventricle has severely decreased function. The left ventricle demonstrates regional wall motion abnormalities (see scoring diagram/findings for  description). Left ventricular diastolic parameters are consistent with Grade I diastolic dysfunction (impaired relaxation).  2. Right ventricular systolic function is mildly reduced. The right ventricular size is normal. Tricuspid regurgitation signal is inadequate for assessing PA pressure.  3. Left atrial size was moderately dilated.  4. There is a trivial pericardial effusion posterior to the left ventricle.  5. The mitral valve is grossly normal. Mild mitral valve regurgitation.  6. The aortic valve is tricuspid. Aortic valve regurgitation is mild to moderate. Aortic valve mean gradient measures 2.0 mmHg.  7. Aortic dilatation noted. There is moderate dilatation of the aortic root, measuring 47 mm. There is mild dilatation of the ascending aorta, measuring 42 mm.  8. The inferior vena cava is normal in size with greater than 50% respiratory variability, suggesting right atrial pressure of 3 mmHg. FINDINGS  Left Ventricle: Left ventricular ejection fraction, by estimation, is 20 to 25%. The left ventricle has severely decreased function. The left ventricle demonstrates regional wall motion abnormalities. The left ventricular internal cavity size was normal  in size. There is borderline left ventricular hypertrophy. Left ventricular diastolic parameters are consistent with Grade I diastolic dysfunction (impaired relaxation).  LV  Wall Scoring: The inferior septum, entire inferior wall, and mid inferolateral segment are akinetic. The entire anterior wall, antero-lateral wall, anterior septum, basal inferolateral segment, apical lateral segment, and apex are hypokinetic. Right Ventricle: The right ventricular size is normal. No increase in right ventricular wall thickness. Right ventricular systolic function is mildly reduced. Tricuspid regurgitation signal is inadequate for assessing PA pressure. Left Atrium: Left atrial size was moderately dilated. Right Atrium: Right atrial size was normal in size. Pericardium:  Trivial pericardial effusion is present. The pericardial effusion is posterior to the left ventricle. Mitral Valve: The mitral valve is grossly normal. There is mild calcification of the mitral valve leaflet(s). Mild mitral valve regurgitation. Tricuspid Valve: The tricuspid valve is grossly normal. Tricuspid valve regurgitation is trivial. Aortic Valve: The aortic valve is tricuspid. Aortic valve regurgitation is mild to moderate. Aortic valve mean gradient measures 2.0 mmHg. Aortic valve peak gradient measures 4.0 mmHg. Aortic valve area, by VTI measures 3.67 cm. Pulmonic Valve: The pulmonic valve was grossly normal. Pulmonic valve regurgitation is trivial. Aorta: Aortic dilatation noted. There is moderate dilatation of the aortic root, measuring 47 mm. There is mild dilatation of the ascending aorta, measuring 42 mm. Venous: The inferior vena cava is normal in size with greater than 50% respiratory variability, suggesting right atrial pressure of 3 mmHg. IAS/Shunts: No atrial level shunt detected by color flow Doppler.  LEFT VENTRICLE PLAX 2D LVIDd:         4.70 cm  Diastology LVIDs:         4.50 cm  LV e' medial:    4.03 cm/s LV PW:         1.10 cm  LV E/e' medial:  11.3 LV IVS:        0.90 cm  LV e' lateral:   5.77 cm/s LVOT diam:     2.90 cm  LV E/e' lateral: 7.9 LV SV:         73 LV SV Index:   39 LVOT Area:     6.61 cm                          3D Volume EF:                         3D EF:        33 %                         LV EDV:       200 ml                         LV ESV:       134 ml                         LV SV:        66 ml RIGHT VENTRICLE          IVC RV Basal diam:  2.90 cm  IVC diam: 1.80 cm LEFT ATRIUM              Index       RIGHT ATRIUM           Index LA diam:        3.70 cm  1.97 cm/m  RA Area:     14.90 cm LA Vol (A2C):  120.0 ml 63.82 ml/m RA Volume:   36.50 ml  19.41 ml/m LA Vol (A4C):   70.1 ml  37.28 ml/m LA Biplane Vol: 97.3 ml  51.75 ml/m  AORTIC VALVE AV Area (Vmax):    4.07  cm AV Area (Vmean):   3.82 cm AV Area (VTI):     3.67 cm AV Vmax:           99.50 cm/s AV Vmean:          68.200 cm/s AV VTI:            0.200 m AV Peak Grad:      4.0 mmHg AV Mean Grad:      2.0 mmHg LVOT Vmax:         61.30 cm/s LVOT Vmean:        39.400 cm/s LVOT VTI:          0.111 m LVOT/AV VTI ratio: 0.55  AORTA Ao Root diam: 4.70 cm Ao Asc diam:  4.20 cm MITRAL VALVE MV Area (PHT): 4.17 cm    SHUNTS MV Decel Time: 182 msec    Systemic VTI:  0.11 m MV E velocity: 45.40 cm/s  Systemic Diam: 2.90 cm MV A velocity: 68.10 cm/s MV E/A ratio:  0.67 Rozann Lesches MD Electronically signed by Rozann Lesches MD Signature Date/Time: 08/24/2020/5:07:45 PM    Final    VAS [name] LOWER EXTREMITY VENOUS (DVT) (ONLY [facility] & [facility])  Result Date: 08/24/2020  Lower Venous DVT Study Indications: Swelling.  Risk Factors: None identified. Comparison Study: No prior studies. Performing Technologist: Oliver Hum RVT  Examination Guidelines: A complete evaluation includes B-mode imaging, spectral Doppler, color Doppler, and power Doppler as needed of all accessible portions of each vessel. Bilateral testing is considered an integral part of a complete examination. Limited examinations for reoccurring indications may be performed as noted. The reflux portion of the exam is performed with the patient in reverse Trendelenburg.  +---------+---------------+---------+-----------+----------+--------------+ RIGHT    CompressibilityPhasicitySpontaneityPropertiesThrombus Aging +---------+---------------+---------+-----------+----------+--------------+ CFV      Full           Yes      Yes                                 +---------+---------------+---------+-----------+----------+--------------+ SFJ      Full                                                        +---------+---------------+---------+-----------+----------+--------------+ FV Prox  Full                                                         +---------+---------------+---------+-----------+----------+--------------+ FV Mid   Full                                                        +---------+---------------+---------+-----------+----------+--------------+ FV DistalFull                                                        +---------+---------------+---------+-----------+----------+--------------+  PFV      Full                                                        +---------+---------------+---------+-----------+----------+--------------+ POP      Full           Yes      Yes                                 +---------+---------------+---------+-----------+----------+--------------+ PTV      Full                                                        +---------+---------------+---------+-----------+----------+--------------+ PERO     Full                                                        +---------+---------------+---------+-----------+----------+--------------+   +---------+---------------+---------+-----------+----------+--------------+ LEFT     CompressibilityPhasicitySpontaneityPropertiesThrombus Aging +---------+---------------+---------+-----------+----------+--------------+ CFV      Full           Yes      Yes                                 +---------+---------------+---------+-----------+----------+--------------+ SFJ      Full                                                        +---------+---------------+---------+-----------+----------+--------------+ FV Prox  Full                                                        +---------+---------------+---------+-----------+----------+--------------+ FV Mid   Full                                                        +---------+---------------+---------+-----------+----------+--------------+ FV DistalFull                                                         +---------+---------------+---------+-----------+----------+--------------+ PFV      Full                                                        +---------+---------------+---------+-----------+----------+--------------+   POP      Full           Yes      Yes                                 +---------+---------------+---------+-----------+----------+--------------+ PTV      Full                                                        +---------+---------------+---------+-----------+----------+--------------+ PERO     Full                                                        +---------+---------------+---------+-----------+----------+--------------+     Summary: RIGHT: - There is no evidence of deep vein thrombosis in the lower extremity.  - No cystic structure found in the popliteal fossa.  LEFT: - There is no evidence of deep vein thrombosis in the lower extremity.  - No cystic structure found in the popliteal fossa.  *See table(s) above for measurements and observations. Electronically signed by Ruta Hinds MD on 08/24/2020 at 11:21:02 AM.    Final      Assessment and Plan:   1. Acute combined CHF: patient presented with LE edema and foot pain. BNP elevated to 985.  HsTrop with low flat trend not c/w ACS (010>272>536). EKG shows non-specific ST-T wave abnormalities and IVCD, though overall unchanged from previous.  CXR with small left pleural effusion. Echo showed EF 20-25%, G1DD, RWMA, mildly reduced RV systolic function, mild MR, mild-moderate AI. He was started on IV lasix 40mg  daily with UOP net -2.5L in the past 24 hours and -3.6L this admission. Weight is down to 140lbs from 145lbs this admission. He has no prior echo's or ischemic evaluations. Given confusion and likely dementia, suspect he is a poor candidate for invasive ischemic evaluation. He has documented allergy to ACEi and valsartan  - Can likely transition to po lasix tomorrow - Will start metoprolol succinate   - Consider addition of hydralazine and imdur  - Will have pharmacy check coverage for SGLT2-inhibitors - Continue to monitor strict I&Os and daily weights.  - Continue to monitor electrolytes closely and replete as needed to maintain K>4, Mg >2  2. Suspected CAD: patient found to have EF 20-25% on echo this admission with RWMA. HsTrop with low flat trend in the 110s. EKG with non-specific ST-T wave abnormalities. He is a poor candidate for invasive ischemic evaluation given dementia and concerns for medication compliance.  - Will start aspirin 81mg  daily - Will transition to high-intensity statin for improved cholesterol control - Will start BBlocker  3. HTN: BP intermittently elevated. Not on medications at home.  - Anticipate management in the context of #1  4. HLD: LDL 147 08/11/20, recently started on pravastatin.  - Will transition to atorvastatin 80mg  daily  5. DM type 2: A1C 5.6 08/11/20. Does not appear to have been on medications prior to admission.  - Consider starting SGLT-2 inhibitor prior to discharge - will have pharmacy check coverage  6. CKD stage 3a: Cr baseline appears to be 1.2. Cr 1.17  on admission, now up to 1.37.  - Continue to monitor closely with diuresis.  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 Agree with above.   80 year old male with underlying memory impairment here with newly discovered ejection fraction of 20  to 25%.  Flat troponin of 120.  BNP 985.  Currently appears very comfortable, in bed.  Denies any shortness of breath fevers chills nausea vomiting syncope.  Made a few words.  Pleasant.  Acute systolic heart failure -Agree with transition tomorrow likely to p.o. Lasix. -Starting metoprolol succinate. -Considering addition of hydralazine and isosorbide. -Can check coverage for SGLT2. -Would not proceed with invasive therapy given lack of anginal symptoms and memory impairment. -Continue with attempted goal-directed medical therapy. -Dilation of aortic root 47 mm.  We will likely repeat echocardiogram next year.  Would not likely be invasive candidate once again.  Continue with adequate blood pressure control.  Candee Furbish, MD

## 2020-08-25 NOTE — Progress Notes (Addendum)
Family Medicine Teaching Service Daily Progress Note Intern Pager: [phone]  Patient name: [name] Medical record number: [id number] Date of birth: 1940/10/27 Age: 80 y.o. Gender: male  Primary Care Provider: Richarda Osmond, DO Consultants: Cardiology (s/o) Code Status: Full  Pt Overview and Major Events to Date:  3/19 : admitted with elevated trop, cellulitis and BLE  Assessment and Plan: [name] is a 80 y.o. male presenting with chest pain (resolved), elevated troponin and BNP found to have HFrEF, and LLE cellulitis . PMH is significant for tubulovillous adenoma, Mallory-Weiss tear, hypertension, hyperlipidemia, gout, diabetes, BPH, B12 deficiency.  Left lower extremity cellulitis Patient presents with left foot erythema in addition to bilateral lower extremity edema.  -S/p CTXx2  - Con't Keflex 500mg  QID for 6 days (3/20-)  HFrEF  Bilateral lower extremity edema  EF 20-35%, G1DD. Mild/Moderate aortic regurgitation and moderate aortic dilation. Initial 3+ bilateral LE edema (improving) likely due to heart failure. S/p lasix 40mg  yesterday. UOP 2473mL. Wt 135.1 down from 140.65, and 145.5 on admission.  - Cardiology consult, appreciate continued care and recommendations:  - transition to po lasix- consider 20mg  daily  - start metoprolol succinate. May not need hydralazine and imdur with normal Bps   - pharmacy to check coverage for SGLT2i  - monitor electrolytes to keep K>4, Mg >2  - signed off 3/22 - Strict I's &Os, daily weights    Dementia  On admission, patient oriented to self and reports living independently ambulating independently.  -We will plan to discuss with family and have patient follow-up with PCP for outpatient management  Leukopenia  WBC 3.5 on admission and PCP notes reporting some weight loss.  - AM CBC   Diabetes, well controlled CBG this am 97. Last hemoglobin A1c 5.6 on 08/12/2018 PCP noted that patient has not been compliant  with medications at home.  Glucose on admission 112. - Con't to monitor   Hypertension Blood pressure elevated to max of 171/92.  Most recent blood pressure 117/84.  No home medications are recorded for hypertension. -Monitor blood pressure with vitals -Patient with reported allergies to Acei and Valsartan (edema). Consider starting amlodipine 5 mg if blood pressure does not correct with pain management  Hyperlipidemia Last LDL measured on 08/11/2020.  Elevated at 147, HDL 38, triglycerides 55. Home meds include pravastatin  - Switch to Atorvastatin 80mg   History of gout Uric acid 6.2 on admission. No home medications in patient's chart for maintenance therapy.  - will monitor for improvement throughout hospitalization   FEN/GI: Heart healthy diet Prophylaxis: lovenox    Disposition: med-tele   Subjective:  No acute events overnight. Patient seemed more confused this morning, stating he did not remember seeing the cardiologist yesterday, and also did not remember seeing myself yesterday. Alert and oriented to his name and location but refused to answer the year stating my questions stating "Im not crazy." When asked about his allergies to medication he said he was not sure. He was also confused about his heart, not seeming to know that his heart function was decreased.   Objective: Temp:  [97.7 F (36.5 C)-98.3 F (36.8 C)] 97.7 F (36.5 C) (03/21 1220) Pulse Rate:  [66-80] 80 (03/21 1220) Resp:  [16-18] 16 (03/21 1220) BP: (127-146)/(81-94) 127/94 (03/21 1220) SpO2:  [98 %-100 %] 100 % (03/21 1220) Weight:  [63.8 kg] 63.8 kg (03/21 0439) Physical Exam: General: sitting up in bed, slightly confused, NAD Cardiovascular: RRR no murmurs Respiratory: CTAB normal WOB Abdomen: soft, non  distended, non tender Extremities: warm, dry. Trace edema   Laboratory: Recent Labs  Lab 08/23/20 1142 08/24/20 0029  WBC 3.5* 3.4*  HGB 10.9* 10.3*  HCT 33.3* 32.8*  PLT 203 170    Recent Labs  Lab 08/23/20 1142 08/24/20 0029 08/25/20 0343  NA 136 136 137  K 3.3* 2.8* 3.5  CL 103 103 101  CO2 26 26 30   BUN 11 12 13   CREATININE 1.17 1.25* 1.37*  CALCIUM 9.2 9.0 9.5  PROT  --  6.5  --   BILITOT  --  0.7  --   ALKPHOS  --  33*  --   ALT  --  15  --   AST  --  23  --   GLUCOSE 112* 145* 112*    Imaging/Diagnostic Tests: None new  Shary Key, DO 08/25/2020, 4:31 PM PGY-1, [place] Intern pager: [phone], text pages welcome

## 2020-08-25 NOTE — NC FL2 (Signed)
[place] LEVEL OF CARE SCREENING TOOL     IDENTIFICATION  Patient Name: [name] Birthdate: 14-Jul-1940 Sex: male Admission Date (Current Location): 08/23/2020  [facility] and [place] Number:  Herbalist and Address:  [facility] [place], [place] [address], [place],  [address]      Provider Number: [id number]  Attending Physician Name and Address:  Zenia Resides, MD  Relative Name and Phone Number:       Current Level of Care: Hospital Recommended Level of Care: [place] Prior Approval Number:    Date Approved/Denied:   PASRR Number: [id number]  Discharge Plan: Home    Current Diagnoses: Patient Active Problem List   Diagnosis Date Noted  . Peripheral edema   . Cognitive decline   . Lives alone   . High risk social situation   . Cellulitis 08/23/2020  . Elevated troponin 08/23/2020  . History of dementia 08/11/2020  . Murmur, cardiac 08/11/2020  . Episodic tension-type headache, not intractable 12/07/2019  . Swelling around both eyes 07/18/2019  . Weight loss 02/06/2019  . B12 deficiency 07/21/2017  . Osteoarthritis 05/12/2017  . Hyperlipidemia 12/20/2016  . Memory loss 07/06/2016  . Insomnia 01/09/2015  . Diabetes mellitus ([place]) 06/05/2014  . 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 ANEMIA-IRON DEFICIENCY 07/07/2009  . Essential hypertension 04/21/2007  . Chronic urticaria 04/21/2007    Orientation RESPIRATION BLADDER Height & Weight     Self,Time,Place  Normal Incontinent Weight: 140 lb 10.5 oz (63.8 kg) Height:  6\' 2"  (188 cm)  BEHAVIORAL SYMPTOMS/MOOD NEUROLOGICAL BOWEL NUTRITION STATUS      Incontinent Diet (See discharge summary)  AMBULATORY STATUS COMMUNICATION OF NEEDS Skin   Limited Assist Verbally Normal                       Personal Care Assistance Level of Assistance  Bathing,Feeding,Dressing Bathing Assistance: Limited assistance Feeding assistance:  Independent Dressing Assistance: Limited assistance     Functional Limitations Info  Sight,Hearing,Speech Sight Info: Adequate Hearing Info: Adequate Speech Info: Adequate    SPECIAL CARE FACTORS FREQUENCY  OT (By licensed OT),PT (By licensed PT)     PT Frequency: 5x a week OT Frequency: 5x a week            Contractures Contractures Info: Not present    Additional Factors Info  Code Status,Allergies Code Status Info: Full Allergies Info: Ace Inhibitors   Valsartan           Current Medications (08/25/2020):  This is the current hospital active medication list Current Facility-Administered Medications  Medication Dose Route Frequency Provider Last Rate Last Admin  . acetaminophen (TYLENOL) tablet 650 mg  650 mg Oral Q6H PRN Simmons-Robinson, Makiera, MD       Or  . acetaminophen (TYLENOL) suppository 650 mg  650 mg Rectal Q6H PRN Simmons-Robinson, Makiera, MD      . cephALEXin (KEFLEX) capsule 500 mg  500 mg Oral Q6H Simmons-Robinson, Makiera, MD   500 mg at 08/25/20 1131  . enoxaparin (LOVENOX) injection 40 mg  40 mg Subcutaneous Q24H Simmons-Robinson, Makiera, MD   40 mg at 08/24/20 2137  . insulin aspart (novoLOG) injection 0-6 Units  0-6 Units Subcutaneous TID WC Simmons-Robinson, Makiera, MD      . potassium chloride (KLOR-CON) packet 40 mEq  40 mEq Oral Daily Simmons-Robinson, Makiera, MD   40 mEq at 08/25/20 0852  . pravastatin (PRAVACHOL) tablet 80 mg  80  mg Oral Daily Simmons-Robinson, Makiera, MD   80 mg at 08/25/20 0851  . ramelteon (ROZEREM) tablet 8 mg  8 mg Oral QHS Espinoza, Alejandra, DO   8 mg at 08/24/20 2223     Discharge Medications: Please see discharge summary for a list of discharge medications.  Relevant Imaging Results:  Relevant Lab Results:   Additional Information SSN [id number]  Emeterio Reeve, [place]

## 2020-08-25 NOTE — TOC Progression Note (Signed)
Transition of Care [facility]) - Progression Note    Patient Details  Name: [name] MRN: [id number] Date of Birth: 18-Feb-1941  Transition [facility] CM/SW [place], [place] Phone Number: 08/25/2020, 12:17 PM  Clinical Narrative:     CSW spoke to pt at bedside concerning SNF. Pt stated tht he was open to going to snf for a little while. Pt gave CSW permission to fax out to facilities. Pt gave CSW permission to speak to daughter about bed choices.    Expected Discharge Plan: Home/Self Care Barriers to Discharge: Continued Medical Work up,Other (comment) (Declining recommendation for SNF)  Expected Discharge Plan and Services Expected Discharge Plan: Home/Self Care     Post Acute Care Choice: NA Living arrangements for the past 2 months: Single Family Home                                       Social Determinants of Health (SDOH) Interventions    Readmission Risk Interventions No flowsheet data found.  Emeterio Reeve, Latanya Presser, [place] Social Worker [phone]

## 2020-08-25 NOTE — Hospital Course (Addendum)
[name] is a 80 y.o. male presenting with chest pain (resolved), elevated troponin and BNP found to have HFrEF, and LLE cellulitis . PMH is significant for tubulovillous adenoma, Mallory-Weiss tear, hypertension, hyperlipidemia, gout, diabetes, BPH, B12 deficiency.  Left lower extremity cellulitis Patient presented with left foot erythema in addition to bilateral lower extremity edema.  He received 2 doses ceftriaxone and then was transitioned to oral Keflex 500mg  QID for 6 days.   HFrEF  Blateral lower extremity edema  Troponin noted to be 114 and increased to 122 on admission. Non specific T wave changes on EKG without ST elevation. EF 20-35%, G1DD. Mild/Moderate aortic regurgitation and moderate aortic dilation.  3+ bilateral LE edema likely due to heart failure.  Patient was started on Lasix, with good urinary output.  Cardiology was consulted who recommended transitioning to oral Lasix, starting metoprolol, aspirin, and statin. Patient was normotensive and cardiology did not recommend adding hydralazine and Imdur due to concern for blood pressure tolerance. He remained hemodynamically stable during his hospitalization. Cardiology recommended SGLT2i for mortality benefit as patient cannot take ACE/ARB due to blood pressure (including entresto). Jardiance 10 mg daly started.   Follow Up Follow up with [facility] for dementia evaluation on Oct 09, 2020 at 1:30 PM Continue on lasix 40 mg PO for HFrEF Patient should continue keflex for cellulitis and end on 08/30/20. Please start SGLT2i, jardiance 10 mg daily, at discharge.

## 2020-08-26 ENCOUNTER — Ambulatory Visit: Payer: Medicare PPO | Admitting: Student in an Organized Health Care Education/Training Program

## 2020-08-26 DIAGNOSIS — I5022 Chronic systolic (congestive) heart failure: Secondary | ICD-10-CM | POA: Diagnosis not present

## 2020-08-26 DIAGNOSIS — Z609 Problem related to social environment, unspecified: Secondary | ICD-10-CM | POA: Diagnosis not present

## 2020-08-26 DIAGNOSIS — R4189 Other symptoms and signs involving cognitive functions and awareness: Secondary | ICD-10-CM | POA: Diagnosis not present

## 2020-08-26 DIAGNOSIS — L03116 Cellulitis of left lower limb: Secondary | ICD-10-CM | POA: Diagnosis not present

## 2020-08-26 LAB — BASIC METABOLIC PANEL
Anion gap: 5 (ref 5–15)
BUN: 14 mg/dL (ref 8–23)
CO2: 29 mmol/L (ref 22–32)
Calcium: 9.3 mg/dL (ref 8.9–10.3)
Chloride: 101 mmol/L (ref 98–111)
Creatinine, Ser: 1.25 mg/dL — ABNORMAL HIGH (ref 0.61–1.24)
GFR, Estimated: 58 mL/min — ABNORMAL LOW (ref >60)
Glucose, Bld: 110 mg/dL — ABNORMAL HIGH (ref 70–99)
Potassium: 3.4 mmol/L — ABNORMAL LOW (ref 3.5–5.1)
Sodium: 135 mmol/L (ref 135–145)

## 2020-08-26 LAB — GLUCOSE, CAPILLARY
Glucose-Capillary: 97 mg/dL (ref 70–99)
Glucose-Capillary: 92 mg/dL (ref 70–99)
Glucose-Capillary: 105 mg/dL — ABNORMAL HIGH (ref 70–99)
Glucose-Capillary: 147 mg/dL — ABNORMAL HIGH (ref 70–99)

## 2020-08-26 MED ORDER — ASPIRIN 81 MG PO CHEW
81.0000 mg | CHEWABLE_TABLET | Freq: Every day | ORAL | Status: DC
  Administered 2020-08-26 – 2020-08-27 (×2): 81 mg via ORAL
  Filled 2020-08-26 (×2): qty 1

## 2020-08-26 MED ORDER — FUROSEMIDE 40 MG PO TABS
40.0000 mg | ORAL_TABLET | Freq: Once | ORAL | Status: AC
  Administered 2020-08-26: 40 mg via ORAL
  Filled 2020-08-26: qty 1

## 2020-08-26 MED ORDER — FUROSEMIDE 20 MG PO TABS
20.0000 mg | ORAL_TABLET | Freq: Every day | ORAL | Status: DC
  Administered 2020-08-26 – 2020-08-27 (×2): 20 mg via ORAL
  Filled 2020-08-26 (×2): qty 1

## 2020-08-26 NOTE — Progress Notes (Signed)
Pharmacist Heart Failure Core Measure Documentation  Assessment: [name] has an EF documented as 20-25% on 08/24/20 by ECHO.  Rationale: Heart failure patients with left ventricular systolic dysfunction (LVSD) and an EF < 40% should be prescribed an angiotensin converting enzyme inhibitor (ACEI) or angiotensin receptor blocker (ARB) at discharge unless a contraindication is documented in the medical record.  This patient is not currently on an ACEI or ARB for HF.  This note is being placed in the record in order to provide documentation that a contraindication to the use of these agents is present for this encounter.  ACE Inhibitor or Angiotensin Receptor Blocker is contraindicated (specify all that apply)  [x]   ACEI allergy AND ARB allergy []   Angioedema []   Moderate or severe aortic stenosis []   Hyperkalemia []   Hypotension []   Renal artery stenosis []   Worsening renal function, preexisting renal disease or dysfunction

## 2020-08-26 NOTE — Progress Notes (Signed)
Progress Note  Patient Name: [name] Date of Encounter: 08/26/2020  [facility] Cardiologist: Candee Furbish, MD   Subjective   Feels better. When asked where he lives he says he lives here now.   Inpatient Medications    Scheduled Meds: . atorvastatin  80 mg Oral QHS  . cephALEXin  500 mg Oral Q6H  . enoxaparin (LOVENOX) injection  40 mg Subcutaneous Q24H  . insulin aspart  0-6 Units Subcutaneous TID WC  . metoprolol succinate  25 mg Oral Daily  . ramelteon  8 mg Oral QHS   Continuous Infusions:  PRN Meds: acetaminophen **OR** acetaminophen   Vital Signs    Vitals:   08/25/20 1220 08/25/20 1823 08/25/20 2322 08/26/20 0425  BP: (!) 127/94 (!) 152/88 119/73 117/84  Pulse: 80 87 64 75  Resp: 16 16 15 16   Temp: 97.7 F (36.5 C) 97.9 F (36.6 C) 98.8 F (37.1 C) 98.9 F (37.2 C)  TempSrc: Oral Oral Oral Oral  SpO2: 100% 98% 98% 95%  Weight:    61.3 kg  Height:        Intake/Output Summary (Last 24 hours) at 08/26/2020 1287 Last data filed at 08/26/2020 0601 Gross per 24 hour  Intake 300 ml  Output 2400 ml  Net -2100 ml   Last 3 Weights 08/26/2020 08/25/2020 08/24/2020  Weight (lbs) 135 lb 1.6 oz 140 lb 10.5 oz 142 lb 3.2 oz  Weight (kg) 61.281 kg 63.8 kg 64.5 kg      Telemetry    No adverse rhythm - Personally Reviewed  ECG    No new - Personally Reviewed  Physical Exam   GEN: No acute distress.   Neck: No JVD Cardiac: RRR, no murmurs, rubs, or gallops.  Respiratory: Clear to auscultation bilaterally. GI: Soft, nontender, non-distended  MS: No edema; No deformity. Neuro:  Nonfocal  Psych: Normal affect   Labs    High Sensitivity Troponin:   Recent Labs  Lab 08/23/20 1142 08/23/20 1505 08/23/20 2331 08/24/20 0041  TROPONINIHS 114* 122* 122* 117*      Chemistry Recent Labs  Lab 08/24/20 0029 08/25/20 0343 08/26/20 0019  NA 136 137 135  K 2.8* 3.5 3.4*  CL 103 101 101  CO2 26 30 29   GLUCOSE 145* 112* 110*  BUN 12 13 14    CREATININE 1.25* 1.37* 1.25*  CALCIUM 9.0 9.5 9.3  PROT 6.5  --   --   ALBUMIN 2.8*  --   --   AST 23  --   --   ALT 15  --   --   ALKPHOS 33*  --   --   BILITOT 0.7  --   --   GFRNONAA 58* 52* 58*  ANIONGAP 7 6 5      Hematology Recent Labs  Lab 08/23/20 1142 08/24/20 0029  WBC 3.5* 3.4*  RBC 3.77* 3.71*  HGB 10.9* 10.3*  HCT 33.3* 32.8*  MCV 88.3 88.4  MCH 28.9 27.8  MCHC 32.7 31.4  RDW 12.5 12.4  PLT 203 170    BNP Recent Labs  Lab 08/23/20 1142  BNP 985.6*     DDimer No results for input(s): DDIMER in the last 168 hours.   Radiology    ECHOCARDIOGRAM COMPLETE  Result Date: 08/24/2020    ECHOCARDIOGRAM REPORT   Patient Name:   [name] Date of Exam: 08/24/2020 Medical Rec #:  [id number]       Height:       74.0 in Accession #:  [id number]      Weight:       142.2 lb Date of Birth:  [birth year]       BSA:          1.880 m Patient Age:    80 years        BP:           136/83 mmHg Patient Gender: M               HR:           76 bpm. Exam Location:  Inpatient Procedure: 2D Echo, Cardiac Doppler and Color Doppler Indications:    Elevated troponin  History:        Patient has no prior history of Echocardiogram examinations.  Sonographer:    Merrie Roof RDCS Referring Phys: [place]  1. Left ventricular ejection fraction, by estimation, is 20 to 25%. The left ventricle has severely decreased function. The left ventricle demonstrates regional wall motion abnormalities (see scoring diagram/findings for description). Left ventricular diastolic parameters are consistent with Grade I diastolic dysfunction (impaired relaxation).  2. Right ventricular systolic function is mildly reduced. The right ventricular size is normal. Tricuspid regurgitation signal is inadequate for assessing PA pressure.  3. Left atrial size was moderately dilated.  4. There is a trivial pericardial effusion posterior to the left ventricle.  5. The mitral valve is grossly normal.  Mild mitral valve regurgitation.  6. The aortic valve is tricuspid. Aortic valve regurgitation is mild to moderate. Aortic valve mean gradient measures 2.0 mmHg.  7. Aortic dilatation noted. There is moderate dilatation of the aortic root, measuring 47 mm. There is mild dilatation of the ascending aorta, measuring 42 mm.  8. The inferior vena cava is normal in size with greater than 50% respiratory variability, suggesting right atrial pressure of 3 mmHg. FINDINGS  Left Ventricle: Left ventricular ejection fraction, by estimation, is 20 to 25%. The left ventricle has severely decreased function. The left ventricle demonstrates regional wall motion abnormalities. The left ventricular internal cavity size was normal  in size. There is borderline left ventricular hypertrophy. Left ventricular diastolic parameters are consistent with Grade I diastolic dysfunction (impaired relaxation).  LV Wall Scoring: The inferior septum, entire inferior wall, and mid inferolateral segment are akinetic. The entire anterior wall, antero-lateral wall, anterior septum, basal inferolateral segment, apical lateral segment, and apex are hypokinetic. Right Ventricle: The right ventricular size is normal. No increase in right ventricular wall thickness. Right ventricular systolic function is mildly reduced. Tricuspid regurgitation signal is inadequate for assessing PA pressure. Left Atrium: Left atrial size was moderately dilated. Right Atrium: Right atrial size was normal in size. Pericardium: Trivial pericardial effusion is present. The pericardial effusion is posterior to the left ventricle. Mitral Valve: The mitral valve is grossly normal. There is mild calcification of the mitral valve leaflet(s). Mild mitral valve regurgitation. Tricuspid Valve: The tricuspid valve is grossly normal. Tricuspid valve regurgitation is trivial. Aortic Valve: The aortic valve is tricuspid. Aortic valve regurgitation is mild to moderate. Aortic valve mean  gradient measures 2.0 mmHg. Aortic valve peak gradient measures 4.0 mmHg. Aortic valve area, by VTI measures 3.67 cm. Pulmonic Valve: The pulmonic valve was grossly normal. Pulmonic valve regurgitation is trivial. Aorta: Aortic dilatation noted. There is moderate dilatation of the aortic root, measuring 47 mm. There is mild dilatation of the ascending aorta, measuring 42 mm. Venous: The inferior vena cava is normal in size with greater than 50% respiratory variability,  suggesting right atrial pressure of 3 mmHg. IAS/Shunts: No atrial level shunt detected by color flow Doppler.  LEFT VENTRICLE PLAX 2D LVIDd:         4.70 cm  Diastology LVIDs:         4.50 cm  LV e' medial:    4.03 cm/s LV PW:         1.10 cm  LV E/e' medial:  11.3 LV IVS:        0.90 cm  LV e' lateral:   5.77 cm/s LVOT diam:     2.90 cm  LV E/e' lateral: 7.9 LV SV:         73 LV SV Index:   39 LVOT Area:     6.61 cm                          3D Volume EF:                         3D EF:        33 %                         LV EDV:       200 ml                         LV ESV:       134 ml                         LV SV:        66 ml RIGHT VENTRICLE          IVC RV Basal diam:  2.90 cm  IVC diam: 1.80 cm LEFT ATRIUM              Index       RIGHT ATRIUM           Index LA diam:        3.70 cm  1.97 cm/m  RA Area:     14.90 cm LA Vol (A2C):   120.0 ml 63.82 ml/m RA Volume:   36.50 ml  19.41 ml/m LA Vol (A4C):   70.1 ml  37.28 ml/m LA Biplane Vol: 97.3 ml  51.75 ml/m  AORTIC VALVE AV Area (Vmax):    4.07 cm AV Area (Vmean):   3.82 cm AV Area (VTI):     3.67 cm AV Vmax:           99.50 cm/s AV Vmean:          68.200 cm/s AV VTI:            0.200 m AV Peak Grad:      4.0 mmHg AV Mean Grad:      2.0 mmHg LVOT Vmax:         61.30 cm/s LVOT Vmean:        39.400 cm/s LVOT VTI:          0.111 m LVOT/AV VTI ratio: 0.55  AORTA Ao Root diam: 4.70 cm Ao Asc diam:  4.20 cm MITRAL VALVE MV Area (PHT): 4.17 cm    SHUNTS MV Decel Time: 182 msec    Systemic VTI:   0.11 m MV E velocity: 45.40 cm/s  Systemic Diam: 2.90 cm MV A velocity: 68.10 cm/s MV E/A ratio:  0.67  Rozann Lesches MD Electronically signed by Rozann Lesches MD Signature Date/Time: 08/24/2020/5:07:45 PM    Final      Patient Profile     80 y.o. male with memory impairment with newly discovered EF 39 to 25%, cardiomyopathy, acute systolic heart failure with flat troponin of 120 and BNP of 985.  Assessment & Plan    Acute systolic heart failure/cardiomyopathy -EF 20 to 25%, appears well compensated -Continue with metoprolol succinate 25 mg a day -Consider Lasix 20 mg a day. -Last 2 blood pressures within excellent range 1 95-0 19 systolic. May not need hydral/imdur   Moderate aortic root dilatation -Continue to monitor clinically.  We will set up a follow up appt.   Will sign off. Please call with ?  For questions or updates, please contact [place] Please consult [email or web address] for contact info under        Signed, Candee Furbish, MD  08/26/2020, 9:18 AM

## 2020-08-26 NOTE — TOC Benefit Eligibility Note (Signed)
Patient Teacher, English as a foreign language completed.    The patient is currently admitted and upon discharge could be taking Jardiance 10 mg.  The current 30 day co-pay is, $9.85.   The patient is currently admitted and upon discharge could be taking Farxiga 10 mg.  The current 30 day co-pay is, $9.85.   The patient is insured through [employer], [place] Patient Advocate Specialist [place] Team Direct Number: [phone]  Fax: [phone]

## 2020-08-26 NOTE — Discharge Summary (Signed)
[facility] Discharge Summary  Patient name: [name] Medical record number: [id number] Date of birth: 1941/03/04 Age: 80 y.o. Gender: male Date of Admission: 08/23/2020  Date of Discharge: 08/27/20 Admitting Physician: Gifford Shave, MD  Primary Care Provider: Richarda Osmond, DO Consultants: Cardiology  Indication for Hospitalization: Chest pain and cellulitis   Discharge Diagnoses/Problem List:  Left lower extremity cellulitis  Heart failure with reduced ejection fraction Hypertension Dementia  Hyperlipidemia   Disposition:  SNF  Discharge Condition: Stable   Discharge Exam:   Temp:  [98.5 F (36.9 C)-98.9 F (37.2 C)] 98.9 F (37.2 C) (03/23 0534) Pulse Rate:  [54-63] 61 (03/23 0534) Resp:  [16-18] 16 (03/23 0534) BP: (101-113)/(65-67) 112/66 (03/23 0534) SpO2:  [99 %-100 %] 99 % (03/23 0534) Weight:  [61.2 kg] 61.2 kg (03/23 0534) Physical Exam: General: well appearing, NAD Cardiovascular: RRR no murmurs  Respiratory: CTAB Normal WOB Abdomen: soft, non-distended, non-tender  Extremities: warm, dry, well perfused   Brief Hospital Course:  [name] is a 80 y.o. male presenting with chest pain (resolved), elevated troponin and BNP found to have HFrEF, and LLE cellulitis . PMH is significant for tubulovillous adenoma, Mallory-Weiss tear, hypertension, hyperlipidemia, gout, diabetes, BPH, B12 deficiency.  Left lower extremity cellulitis Patient presented with left foot erythema in addition to bilateral lower extremity edema.  He received 2 doses ceftriaxone and then was transitioned to oral Keflex 500mg  QID for 6 days.   HFrEF  Blateral lower extremity edema  Troponin noted to be 114 and peaked to 122 on admission. BNP elevated to 985. Non specific T wave changes on EKG without ST elevation. EF 20-35%, G1DD. Mild/Moderate aortic regurgitation and moderate aortic dilation.  3+ bilateral LE edema likely due to heart  failure.  Patient was started on Lasix, with good urinary output.  Cardiology was consulted who recommended transitioning to oral Lasix, starting metoprolol, aspirin, and statin. He was started on Jardiance at discharge. Patient was normotensive and cardiology did not recommend adding hydralazine and Imdur due to concern for blood pressure tolerance. He remained hemodynamically stable during his hospitalization.   Issues for Follow Up:   - Follow up with your PCP at the [facility] and check BMP and volume status with new Lasix dose. Also f/u with Geriatrics clinic for dementia eval  - Follow up with Cardiology outpatient   Significant Procedures: None   Significant Labs and Imaging:  Recent Labs  Lab 08/23/20 1142 08/24/20 0029  WBC 3.5* 3.4*  HGB 10.9* 10.3*  HCT 33.3* 32.8*  PLT 203 170   Recent Labs  Lab 08/23/20 1142 08/24/20 0029 08/25/20 0343 08/26/20 0019 08/27/20 0104  NA 136 136 137 135 134*  K 3.3* 2.8* 3.5 3.4* 4.1  CL 103 103 101 101 99  CO2 26 26 30 29 30   GLUCOSE 112* 145* 112* 110* 106*  BUN 11 12 13 14 17   CREATININE 1.17 1.25* 1.37* 1.25* 1.39*  CALCIUM 9.2 9.0 9.5 9.3 9.4  ALKPHOS  --  33*  --   --   --   AST  --  23  --   --   --   ALT  --  15  --   --   --   ALBUMIN  --  2.8*  --   --   --     Results/Tests Pending at Time of Discharge: None   Discharge Medications:  Allergies as of 08/27/2020      Reactions  Ace Inhibitors Swelling, Other (See Comments)   Site of swelling not known by the patient   Valsartan Swelling, Other (See Comments)   Site of swelling not known by the patient      Medication List    STOP taking these medications   pravastatin 80 MG tablet Commonly known as: PRAVACHOL     TAKE these medications   aspirin 81 MG chewable tablet Chew 1 tablet (81 mg total) by mouth daily. Start taking on: August 28, 2020   atorvastatin 80 MG tablet Commonly known as: LIPITOR Take 1 tablet (80 mg total) by mouth at  bedtime.   cephALEXin 500 MG capsule Commonly known as: KEFLEX Take 1 capsule (500 mg total) by mouth every 6 (six) hours for 3 days.   empagliflozin 10 MG Tabs tablet Commonly known as: Jardiance Take 1 tablet (10 mg total) by mouth daily before breakfast.   furosemide 40 MG tablet Commonly known as: Lasix Take 1 tablet (40 mg total) by mouth daily.   metoprolol succinate 25 MG 24 hr tablet Commonly known as: TOPROL-XL Take 1 tablet (25 mg total) by mouth daily. Start taking on: August 28, 2020       Discharge Instructions: Please refer to Patient Instructions section of EMR for full details.  Patient was counseled important signs and symptoms that should prompt return to medical care, changes in medications, dietary instructions, activity restrictions, and follow up appointments.   Follow-Up Appointments:  Contact information for follow-up providers    McDiarmid, Blane Ohara, MD. Go on 10/09/2020.   Specialty: Family Medicine Why: Please arrive at 1:15 PM for a 1:30 PM appt Contact information: [place] [address] [phone]        Richardson Dopp T, [place]. Go on 09/29/2020.   Specialties: Cardiology, Physician Assistant Why: Please go to 2:15 PM appt Contact information: [address] [place] [address] [phone]        Doristine Mango L, DO. Go on 09/11/2020.   Specialty: Family Medicine Why: Please arrive at 1:15 PM for a 1:30 PM appt Contact information: [address] [place] [address] [phone]        Jerline Pain, MD .   Specialty: Cardiology Contact information: [id number] [place] [address] [phone]            Contact information for after-discharge care    Destination    [facility] Preferred SNF .   Service: Skilled Nursing Contact information: [address] [place], [place], DO 08/27/2020, 12:12 PM PGY-1, [place]

## 2020-08-26 NOTE — TOC Progression Note (Addendum)
Transition of Care [facility]) - Progression Note    Patient Details  Name: [name] MRN: [id number] Date of Birth: [birth year]  Transition of Care [facility]) CM/SW [place], [place] Phone Number: 08/26/2020, 12:14 PM  Clinical Narrative:     CSW gave SNF bed offers to pt at bedside. Pt gave CSW permission to call his daughter [name] to also give bed offers. [place] sated that she has seen a decline in pts memory and worries because he lives home alone. [place] stated she hopes to get him into an ALF/ILF after SNF.   [place] plans to visit pt in room and discuss the SNF bed offers. CSW informed [facility] [place] that they need to pick a bed today so they can continue to SNF process.   2:20pm- Pt and daughter chose [facility] care. CSW confirmed they can take him tomorrow. CSW requested covid test from MD. CSW started insurance [place], reference number [id number].   Expected Discharge Plan: Home/Self Care Barriers to Discharge: Continued Medical Work up,Other (comment) (Declining recommendation for SNF)  Expected Discharge Plan and Services Expected Discharge Plan: Home/Self Care     Post Acute Care Choice: NA Living arrangements for the past 2 months: Single Family Home                                       Social Determinants of Health (SDOH) Interventions    Readmission Risk Interventions No flowsheet data found.  Emeterio Reeve, Latanya Presser, [place] Social Worker [phone]

## 2020-08-26 NOTE — Progress Notes (Signed)
Physical Therapy Treatment Patient Details Name: [name] MRN: [id number] DOB: [birth year] Today's Date: 08/26/2020    History of Present Illness 80 y.o. male presenting with chest pain (resolved), left foot erythema in addition to bilateral lower extremity edema. Pt admitted on 08/23/20 with cellulitis. Doppler negative for DVT bil;  LE; X-ray of Lt foot showed moderate soft tissue swelling distal foot, and hallux valgus with moderate 1st MTP degeneration. PMH is significant for tubulovillous adenoma, Mallory-Weiss (esophageal) tear, hypertension, hyperlipidemia, gout, diabetes, BPH, B12 deficiency.    PT Comments    Pt received in bed, willing to participate in PT. Pt generally min A for steadying during OOB mobility with single point cane. Unable to change speeds, due to feeling unsteady walking faster than normal, and had difficulty maintaining straight pathway during gait with head turns. Pt would benefit from B UE during ambulation to provide more stability and increase steadiness. Pt impulsive at time and demonstrated decreased problem solving and sequencing. Pt left in chair with all needs met, call bell within reach, and chair alarm active. Will continue to follow acutely.   Follow Up Recommendations  SNF;Supervision/Assistance - 24 hour     Equipment Recommendations  Rolling walker with 5" wheels    Recommendations for Other Services       Precautions / Restrictions Precautions Precautions: Fall Restrictions Weight Bearing Restrictions: No    Mobility  Bed Mobility Overal bed mobility: Needs Assistance Bed Mobility: Supine to Sit     Supine to sit: Supervision     General bed mobility comments: no physical assist given, pt impulsively trying to get OOB, cueing to prevent mismanagement of lines    Transfers Overall transfer level: Needs assistance Equipment used: Straight cane Transfers: Sit to/from Stand Sit to Stand: Min assist         General  transfer comment: Bracing legs against bedframe to stand, min A for steadying  Ambulation/Gait Ambulation/Gait assistance: Min assist Gait Distance (Feet): 250 Feet (250 total, including 120 ft for higher level balance activities (gait with head turns, changing speed)) Assistive device: Straight cane Gait Pattern/deviations: Step-through pattern;Decreased stride length;Decreased stance time - left;Decreased weight shift to left Gait velocity: decreased   General Gait Details: Min A for steadying   Stairs             Wheelchair Mobility    Modified Rankin (Stroke Patients Only)       Balance Overall balance assessment: Needs assistance Sitting-balance support: Feet supported Sitting balance-Leahy Scale: Good     Standing balance support: During functional activity;Single extremity supported Standing balance-Leahy Scale: Poor Standing balance comment: requires UE support                 Standardized Balance Assessment Standardized Balance Assessment : Dynamic Gait Index   Dynamic Gait Index Level Surface: Moderate Impairment Change in Gait Speed: Moderate Impairment Gait with Horizontal Head Turns: Moderate Impairment Gait with Vertical Head Turns: Moderate Impairment      Cognition Arousal/Alertness: Awake/alert Behavior During Therapy: WFL for tasks assessed/performed Overall Cognitive Status: No family/caregiver present to determine baseline cognitive functioning                                 General Comments: Per chart review, pt has had cognitive dysfunction with concern for dementia. Demonstrated decreased attention, safety awareness, problem solving, and sequencing. Slightly impulsive at times      Exercises  General Comments General comments (skin integrity, edema, etc.): Was able to complete half of DGI before pt reported that he needed to return to his room in order to urinate.      Pertinent Vitals/Pain Pain  Assessment: Faces Faces Pain Scale: Hurts a little bit Pain Location: Lt foot Pain Descriptors / Indicators: Aching Pain Intervention(s): Monitored during session;Repositioned    Home Living                      Prior Function            PT Goals (current goals can now be found in the care plan section) Acute Rehab PT Goals Patient Stated Goal: for Lt LE to get back to normal    Frequency    Min 3X/week      PT Plan      Co-evaluation              AM-PAC PT "6 Clicks" Mobility   Outcome Measure  Help needed turning from your back to your side while in a flat bed without using bedrails?: None Help needed moving from lying on your back to sitting on the side of a flat bed without using bedrails?: None Help needed moving to and from a bed to a chair (including a wheelchair)?: A Little Help needed standing up from a chair using your arms (e.g., wheelchair or bedside chair)?: A Little Help needed to walk in hospital room?: A Little Help needed climbing 3-5 steps with a railing? : A Little 6 Click Score: 20    End of Session Equipment Utilized During Treatment: Gait belt Activity Tolerance: Other (comment) (Needed to urinate) Patient left: in chair;with call bell/phone within reach;with chair alarm set Nurse Communication: Mobility status PT Visit Diagnosis: Unsteadiness on feet (R26.81);Difficulty in walking, not elsewhere classified (R26.2)     Time:  -     Charges:                        Rosita Kea, SPT

## 2020-08-27 DIAGNOSIS — E785 Hyperlipidemia, unspecified: Secondary | ICD-10-CM | POA: Diagnosis not present

## 2020-08-27 DIAGNOSIS — R404 Transient alteration of awareness: Secondary | ICD-10-CM | POA: Diagnosis not present

## 2020-08-27 DIAGNOSIS — R778 Other specified abnormalities of plasma proteins: Secondary | ICD-10-CM | POA: Diagnosis not present

## 2020-08-27 DIAGNOSIS — Z609 Problem related to social environment, unspecified: Secondary | ICD-10-CM | POA: Diagnosis not present

## 2020-08-27 DIAGNOSIS — Z7401 Bed confinement status: Secondary | ICD-10-CM | POA: Diagnosis not present

## 2020-08-27 DIAGNOSIS — I10 Essential (primary) hypertension: Secondary | ICD-10-CM | POA: Diagnosis not present

## 2020-08-27 DIAGNOSIS — I5022 Chronic systolic (congestive) heart failure: Secondary | ICD-10-CM | POA: Diagnosis not present

## 2020-08-27 DIAGNOSIS — L03116 Cellulitis of left lower limb: Secondary | ICD-10-CM | POA: Diagnosis not present

## 2020-08-27 DIAGNOSIS — Z602 Problems related to living alone: Secondary | ICD-10-CM | POA: Diagnosis not present

## 2020-08-27 DIAGNOSIS — R4189 Other symptoms and signs involving cognitive functions and awareness: Secondary | ICD-10-CM | POA: Diagnosis not present

## 2020-08-27 DIAGNOSIS — F0390 Unspecified dementia without behavioral disturbance: Secondary | ICD-10-CM | POA: Diagnosis not present

## 2020-08-27 DIAGNOSIS — E119 Type 2 diabetes mellitus without complications: Secondary | ICD-10-CM | POA: Diagnosis not present

## 2020-08-27 DIAGNOSIS — R41841 Cognitive communication deficit: Secondary | ICD-10-CM | POA: Diagnosis not present

## 2020-08-27 DIAGNOSIS — M2550 Pain in unspecified joint: Secondary | ICD-10-CM | POA: Diagnosis not present

## 2020-08-27 DIAGNOSIS — L0390 Cellulitis, unspecified: Secondary | ICD-10-CM | POA: Diagnosis not present

## 2020-08-27 DIAGNOSIS — M6281 Muscle weakness (generalized): Secondary | ICD-10-CM | POA: Diagnosis not present

## 2020-08-27 LAB — BASIC METABOLIC PANEL
Anion gap: 5 (ref 5–15)
BUN: 17 mg/dL (ref 8–23)
CO2: 30 mmol/L (ref 22–32)
Calcium: 9.4 mg/dL (ref 8.9–10.3)
Chloride: 99 mmol/L (ref 98–111)
Creatinine, Ser: 1.39 mg/dL — ABNORMAL HIGH (ref 0.61–1.24)
GFR, Estimated: 51 mL/min — ABNORMAL LOW (ref >60)
Glucose, Bld: 106 mg/dL — ABNORMAL HIGH (ref 70–99)
Potassium: 4.1 mmol/L (ref 3.5–5.1)
Sodium: 134 mmol/L — ABNORMAL LOW (ref 135–145)

## 2020-08-27 LAB — GLUCOSE, CAPILLARY
Glucose-Capillary: 101 mg/dL — ABNORMAL HIGH (ref 70–99)
Glucose-Capillary: 99 mg/dL (ref 70–99)

## 2020-08-27 LAB — SARS CORONAVIRUS 2 (TAT 6-24 HRS): SARS Coronavirus 2: NEGATIVE

## 2020-08-27 MED ORDER — EMPAGLIFLOZIN 10 MG PO TABS: 10.0000 mg | ORAL_TABLET | Freq: Every day | ORAL | 0 refills | Status: DC

## 2020-08-27 MED ORDER — CEPHALEXIN 500 MG PO CAPS: 500.0000 mg | ORAL_CAPSULE | Freq: Four times a day (QID) | ORAL | 0 refills | Status: AC

## 2020-08-27 MED ORDER — METOPROLOL SUCCINATE ER 25 MG PO TB24: 25.0000 mg | ORAL_TABLET | Freq: Every day | ORAL | 0 refills | Status: DC

## 2020-08-27 MED ORDER — FUROSEMIDE 40 MG PO TABS: 40.0000 mg | ORAL_TABLET | Freq: Every day | ORAL | 0 refills | Status: DC

## 2020-08-27 MED ORDER — ATORVASTATIN CALCIUM 80 MG PO TABS: 80.0000 mg | ORAL_TABLET | Freq: Every day | ORAL | 0 refills | Status: DC

## 2020-08-27 MED ORDER — ASPIRIN 81 MG PO CHEW: 81.0000 mg | CHEWABLE_TABLET | Freq: Every day | ORAL | 0 refills | Status: DC

## 2020-08-27 NOTE — Discharge Instructions (Signed)
You were hospitalized at [facility] due to worsening of your heart failure.We are so glad you are feeling better. Please also be sure to follow-up with our clinic to see your PCP at your earliest convenience as well as our geriatric clinic. Please be sure to check your discharge summary for updates on your medication- starting Jardiance, Lipitor, Lasix, and Metoprolol and continuing Aspirin, Keflex (until 3/26). Thank you for allowing [name] to take care of you.  Take care, [facility] family medicine team

## 2020-08-27 NOTE — Progress Notes (Signed)
RN called [facility] and gave report to [place], RN pt belongings have been packed and IV has been removed AVS in packet.

## 2020-08-27 NOTE — Progress Notes (Signed)
Physical Therapy Treatment Patient Details Name: [name] MRN: [id number] DOB: 09/08/1940 Today's Date: 08/27/2020    History of Present Illness 80 y.o. male presenting with chest pain (resolved), left foot erythema in addition to bilateral lower extremity edema. Pt admitted on 08/23/20 with cellulitis. Doppler negative for DVT bil;  LE; X-ray of Lt foot showed moderate soft tissue swelling distal foot, and hallux valgus with moderate 1st MTP degeneration. PMH is significant for tubulovillous adenoma, Mallory-Weiss (esophageal) tear, hypertension, hyperlipidemia, gout, diabetes, BPH, B12 deficiency.    PT Comments    Pt received in bed, willing to participate in PT. Due to assistance needed for steadying with single point cane, trialed RW. Pt steadier during ambulation with and at min guard level. Cueing needed to stay close to walker and for RW navigation with turns, but no physical assist given. Pt still needed min A to steady himself upon standing. Pt following cues better this morning and less impulsive. Pt left in chair with all needs met, call bell within reach, RN aware of status, and chair alarm active. Will continue to follow acutely.   Follow Up Recommendations  SNF;Supervision/Assistance - 24 hour     Equipment Recommendations  Rolling walker with 5" wheels    Recommendations for Other Services       Precautions / Restrictions Precautions Precautions: Fall Restrictions Weight Bearing Restrictions: No    Mobility  Bed Mobility Overal bed mobility: Needs Assistance Bed Mobility: Supine to Sit     Supine to sit: Supervision     General bed mobility comments: no physical assist given    Transfers Overall transfer level: Needs assistance Equipment used: Rolling walker (2 wheeled) Transfers: Sit to/from Stand   Stand pivot transfers: Min assist       General transfer comment: Min A for steadying upon standing, cueing for hand  placement  Ambulation/Gait Ambulation/Gait assistance: Min guard Gait Distance (Feet): 160 Feet Assistive device: Rolling walker (2 wheeled) Gait Pattern/deviations: Step-through pattern;Decreased stride length;Decreased stance time - left;Decreased weight shift to left Gait velocity: decreased   General Gait Details: Min guard for safety, no physical assist given, cueing to maintain RW at appropriate distance   Stairs             Wheelchair Mobility    Modified Rankin (Stroke Patients Only)       Balance Overall balance assessment: Needs assistance Sitting-balance support: Feet supported Sitting balance-Leahy Scale: Good     Standing balance support: During functional activity;Single extremity supported Standing balance-Leahy Scale: Poor Standing balance comment: reliant on UE support                            Cognition Arousal/Alertness: Awake/alert Behavior During Therapy: WFL for tasks assessed/performed Overall Cognitive Status: No family/caregiver present to determine baseline cognitive functioning                                 General Comments: Per chart review, pt has had cognitive dysfunction with concern for dementia. Less impulsive today      Exercises      General Comments        Pertinent Vitals/Pain Pain Assessment: Faces Faces Pain Scale: Hurts a little bit Pain Location: Lt foot Pain Descriptors / Indicators: Aching Pain Intervention(s): Monitored during session;Repositioned    Home Living                        Prior Function            PT Goals (current goals can now be found in the care plan section) Acute Rehab PT Goals Patient Stated Goal: for Lt LE to get back to normal    Frequency    Min 3X/week      PT Plan      Co-evaluation              AM-PAC PT "6 Clicks" Mobility   Outcome Measure  Help needed turning from your back to your side while in a flat bed without using  bedrails?: None Help needed moving from lying on your back to sitting on the side of a flat bed without using bedrails?: None Help needed moving to and from a bed to a chair (including a wheelchair)?: A Little Help needed standing up from a chair using your arms (e.g., wheelchair or bedside chair)?: A Little Help needed to walk in hospital room?: A Little Help needed climbing 3-5 steps with a railing? : A Little 6 Click Score: 20    End of Session Equipment Utilized During Treatment: Gait belt Activity Tolerance: Patient tolerated treatment well (Needed to urinate) Patient left: in chair;with call bell/phone within reach;with chair alarm set Nurse Communication: Mobility status PT Visit Diagnosis: Unsteadiness on feet (R26.81);Difficulty in walking, not elsewhere classified (R26.2)     Time:  -     Charges:                        Anne Gallagher, SPT   

## 2020-08-27 NOTE — TOC Transition Note (Addendum)
Transition of Care [facility]) - CM/SW Discharge Note   Patient Details  Name: [name] MRN: [id number] Date of Birth: 06-Aug-1940  Transition of Care [facility]) CM/SW Contact:  Emeterio Reeve, [place] Phone Number: 08/27/2020, 12:32 PM   Clinical Narrative:     Patient will DC to: [facility] Anticipated DC date: 08/27/20 Family notified: daughter Quarry manager by: Corey Harold     Per MD patient ready for DC to [facility]. RN, patient, patient's family, and facility notified of DC. Discharge Summary and FL2 sent to facility. DC packet on chart. Ambulance transport requested for patient.    RN to call report to [phone].  CSW will sign off for now as social work intervention is no longer needed. Please consult [name] again if new needs arise.  Final next level of care: Skilled Nursing Facility Barriers to Discharge: Barriers Resolved   Patient Goals and CMS Choice Patient states their goals for this hospitalization and ongoing recovery are:: "I feel I do fine. I want to make sure I'm okay and go home."   Choice offered to / list presented to : Patient  Discharge Placement              Patient chooses bed at: [facility] Patient to be transferred to facility by: [facility] Name of family member notified: Daughter, [name] Patient and family notified of of transfer: 08/27/20  Discharge Plan and Services     Post Acute Care Choice: NA                               Social Determinants of Health (SDOH) Interventions     Readmission Risk Interventions No flowsheet data found.   Emeterio Reeve, Latanya Presser, [place] Social Worker [phone]

## 2020-08-28 DIAGNOSIS — I5022 Chronic systolic (congestive) heart failure: Secondary | ICD-10-CM | POA: Diagnosis not present

## 2020-08-28 DIAGNOSIS — L03116 Cellulitis of left lower limb: Secondary | ICD-10-CM | POA: Diagnosis not present

## 2020-08-28 DIAGNOSIS — I10 Essential (primary) hypertension: Secondary | ICD-10-CM | POA: Diagnosis not present

## 2020-09-01 ENCOUNTER — Ambulatory Visit (HOSPITAL_COMMUNITY): Payer: Medicare PPO

## 2020-09-07 DIAGNOSIS — G4700 Insomnia, unspecified: Secondary | ICD-10-CM | POA: Diagnosis not present

## 2020-09-07 DIAGNOSIS — N401 Enlarged prostate with lower urinary tract symptoms: Secondary | ICD-10-CM | POA: Diagnosis not present

## 2020-09-07 DIAGNOSIS — M1990 Unspecified osteoarthritis, unspecified site: Secondary | ICD-10-CM | POA: Diagnosis not present

## 2020-09-07 DIAGNOSIS — D509 Iron deficiency anemia, unspecified: Secondary | ICD-10-CM | POA: Diagnosis not present

## 2020-09-07 DIAGNOSIS — J309 Allergic rhinitis, unspecified: Secondary | ICD-10-CM | POA: Diagnosis not present

## 2020-09-07 DIAGNOSIS — M109 Gout, unspecified: Secondary | ICD-10-CM | POA: Diagnosis not present

## 2020-09-07 DIAGNOSIS — L508 Other urticaria: Secondary | ICD-10-CM | POA: Diagnosis not present

## 2020-09-07 DIAGNOSIS — I10 Essential (primary) hypertension: Secondary | ICD-10-CM | POA: Diagnosis not present

## 2020-09-07 DIAGNOSIS — E119 Type 2 diabetes mellitus without complications: Secondary | ICD-10-CM | POA: Diagnosis not present

## 2020-09-09 DIAGNOSIS — M1990 Unspecified osteoarthritis, unspecified site: Secondary | ICD-10-CM | POA: Diagnosis not present

## 2020-09-09 DIAGNOSIS — G4700 Insomnia, unspecified: Secondary | ICD-10-CM | POA: Diagnosis not present

## 2020-09-09 DIAGNOSIS — I10 Essential (primary) hypertension: Secondary | ICD-10-CM | POA: Diagnosis not present

## 2020-09-09 DIAGNOSIS — N401 Enlarged prostate with lower urinary tract symptoms: Secondary | ICD-10-CM | POA: Diagnosis not present

## 2020-09-09 DIAGNOSIS — D509 Iron deficiency anemia, unspecified: Secondary | ICD-10-CM | POA: Diagnosis not present

## 2020-09-09 DIAGNOSIS — M109 Gout, unspecified: Secondary | ICD-10-CM | POA: Diagnosis not present

## 2020-09-09 DIAGNOSIS — L508 Other urticaria: Secondary | ICD-10-CM | POA: Diagnosis not present

## 2020-09-09 DIAGNOSIS — E119 Type 2 diabetes mellitus without complications: Secondary | ICD-10-CM | POA: Diagnosis not present

## 2020-09-09 DIAGNOSIS — J309 Allergic rhinitis, unspecified: Secondary | ICD-10-CM | POA: Diagnosis not present

## 2020-09-11 ENCOUNTER — Ambulatory Visit (INDEPENDENT_AMBULATORY_CARE_PROVIDER_SITE_OTHER): Payer: Medicare PPO | Admitting: Student in an Organized Health Care Education/Training Program

## 2020-09-11 DIAGNOSIS — M109 Gout, unspecified: Secondary | ICD-10-CM | POA: Diagnosis not present

## 2020-09-11 DIAGNOSIS — Z5329 Procedure and treatment not carried out because of patient's decision for other reasons: Secondary | ICD-10-CM

## 2020-09-11 DIAGNOSIS — L508 Other urticaria: Secondary | ICD-10-CM | POA: Diagnosis not present

## 2020-09-11 DIAGNOSIS — E119 Type 2 diabetes mellitus without complications: Secondary | ICD-10-CM | POA: Diagnosis not present

## 2020-09-11 DIAGNOSIS — G4700 Insomnia, unspecified: Secondary | ICD-10-CM | POA: Diagnosis not present

## 2020-09-11 DIAGNOSIS — N401 Enlarged prostate with lower urinary tract symptoms: Secondary | ICD-10-CM | POA: Diagnosis not present

## 2020-09-11 DIAGNOSIS — M1990 Unspecified osteoarthritis, unspecified site: Secondary | ICD-10-CM | POA: Diagnosis not present

## 2020-09-11 DIAGNOSIS — D509 Iron deficiency anemia, unspecified: Secondary | ICD-10-CM | POA: Diagnosis not present

## 2020-09-11 DIAGNOSIS — I10 Essential (primary) hypertension: Secondary | ICD-10-CM | POA: Diagnosis not present

## 2020-09-11 DIAGNOSIS — J309 Allergic rhinitis, unspecified: Secondary | ICD-10-CM | POA: Diagnosis not present

## 2020-09-11 NOTE — Progress Notes (Signed)
Patient no-show for hospital follow up.  Called patient's daughter and left voicemail to let her be aware of the appointment missed and to please reschedule at her earliest convenience.

## 2020-09-15 ENCOUNTER — Telehealth: Payer: Self-pay | Admitting: Student in an Organized Health Care Education/Training Program

## 2020-09-15 NOTE — Telephone Encounter (Signed)
Patients daughter Pattricia Boss is calling and would like for Dr. Ouida Sills to call her. She has some questions concerning his hospital follow up.   The best call back is [phone]

## 2020-09-17 DIAGNOSIS — E119 Type 2 diabetes mellitus without complications: Secondary | ICD-10-CM | POA: Diagnosis not present

## 2020-09-17 DIAGNOSIS — M109 Gout, unspecified: Secondary | ICD-10-CM | POA: Diagnosis not present

## 2020-09-17 DIAGNOSIS — M1990 Unspecified osteoarthritis, unspecified site: Secondary | ICD-10-CM | POA: Diagnosis not present

## 2020-09-17 DIAGNOSIS — N401 Enlarged prostate with lower urinary tract symptoms: Secondary | ICD-10-CM | POA: Diagnosis not present

## 2020-09-17 DIAGNOSIS — D509 Iron deficiency anemia, unspecified: Secondary | ICD-10-CM | POA: Diagnosis not present

## 2020-09-17 DIAGNOSIS — G4700 Insomnia, unspecified: Secondary | ICD-10-CM | POA: Diagnosis not present

## 2020-09-17 DIAGNOSIS — L508 Other urticaria: Secondary | ICD-10-CM | POA: Diagnosis not present

## 2020-09-17 DIAGNOSIS — J309 Allergic rhinitis, unspecified: Secondary | ICD-10-CM | POA: Diagnosis not present

## 2020-09-17 DIAGNOSIS — I10 Essential (primary) hypertension: Secondary | ICD-10-CM | POA: Diagnosis not present

## 2020-09-28 NOTE — Progress Notes (Signed)
Cardiology Office Note:    Date:  09/29/2020   ID:  [name], DOB 02/03/1941, MRN [id number]  PCP:  Richarda Osmond, [place]  Cardiologist:  Candee Furbish, MD  Electrophysiologist:  None       Referring MD: Richarda Osmond, DO   Chief Complaint:  Hospitalization Follow-up (CHF)    Patient Profile:    [name] is a 80 y.o. 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  He was seen by Dr. Marlou Porch.  Echocardiogram demonstrated EF 20-25 with wall motion abnormalities.  His hs-Trop levels were mildly elevated without significant trend/delta.  He was not felt to be an invasive evaluation candidate given dementia and lack of anginal symptoms.  His aortic root was noted to be dilated at 47 mm and this is also to be followed clinically.  He returns for f/u.    He is here today with his daughter, [name].  She notes he was DC to [facility] for 1 week post DC.  He lives alone.  She has been trying to help with his medications.  She thinks he may double up sometimes on his dosages.  He has not had chest pain, shortness of breath, syncope, orthopnea.  His leg edema is improved.      Past Medical History:  Diagnosis  Date  . Allergic rhinitis   . Anemia   . Angio-edema   . B12 deficiency   . BPH (benign prostatic hyperplasia)    N[place]  . Diabetes ([name])   . Gout   . Hyperlipidemia   . Hypertension   . Mallory-Weiss tear   . Thyroid disorder   . 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 Urticaria     Current Medications: Current Meds  Medication Sig  . aspirin 81 MG chewable tablet Chew 1 tablet (81 mg total) by mouth daily.  Marland Kitchen atorvastatin (LIPITOR) 80 MG tablet Take 1 tablet (80 mg total) by mouth at bedtime.  . empagliflozin (JARDIANCE) 10 MG TABS tablet Take 1 tablet (10 mg total) by mouth daily before breakfast.  . furosemide (LASIX) 40 MG tablet Take 1 tablet (40 mg total) by mouth daily.  . metoprolol succinate (TOPROL-XL) 25 MG 24 hr tablet Take 1 tablet (25 mg total) by mouth daily.  . [DISCONTINUED] atorvastatin (LIPITOR) 80 MG tablet Take 1 tablet (80 mg total) by mouth at bedtime.  . [DISCONTINUED] empagliflozin (JARDIANCE) 10 MG TABS tablet Take 1 tablet (10 mg total) by mouth daily before breakfast.  . [DISCONTINUED] furosemide (LASIX) 40 MG tablet Take 1 tablet (40 mg total) by mouth daily.  . [DISCONTINUED] metoprolol succinate (TOPROL-XL) 25 MG 24 hr tablet Take 1 tablet (25  mg total) by mouth daily.     Allergies:   Ace inhibitors and Valsartan   Social History   Tobacco Use  . Smoking status: Current Every Day Smoker    Packs/day: 1.00    Years: 30.00    Pack years: 30.00    Types: Cigarettes  . Smokeless tobacco: Former Systems developer    Types: Chew    Quit date: 06/07/2001  Vaping Use  . Vaping Use: Never used  Substance Use Topics  . Alcohol use: No    Alcohol/week: 0.0 standard drinks    Comment: quit about 10 years ago   . Drug use: No     Family Hx: The patient's family history includes Diabetes in an other family member. There is no history of Colon cancer.  ROS   EKGs/Labs/Other Test Reviewed:    EKG:  EKG is  not ordered today.  The ekg ordered today  demonstrates n/a  Recent Labs: 08/11/2020: TSH 1.150 08/23/2020: B Natriuretic Peptide 985.6 08/24/2020: ALT 15; Hemoglobin 10.3; Platelets 170 08/27/2020: BUN 17; Creatinine, Ser 1.39; Potassium 4.1; Sodium 134   Recent Lipid Panel Lab Results  Component Value Date/Time   CHOL 195 08/11/2020 10:33 AM   TRIG 55 08/11/2020 10:33 AM   HDL 38 (L) 08/11/2020 10:33 AM   CHOLHDL 5.1 (H) 08/11/2020 10:33 AM   CHOLHDL 6.1 08/02/2014 02:23 PM   LDLCALC 147 (H) 08/11/2020 10:33 AM      Risk Assessment/Calculations:      Physical Exam:    VS:  BP (!) 98/52   Pulse 62   Ht 6\' 2"  (1.88 m)   Wt 128 lb 9.6 oz (58.3 kg)   SpO2 94%   BMI 16.51 kg/m     Wt Readings from Last 3 Encounters:  09/29/20 128 lb 9.6 oz (58.3 kg)  08/27/20 134 lb 14.4 oz (61.2 kg)  08/11/20 150 lb 3.2 oz (68.1 kg)     Constitutional:      Appearance: Healthy appearance. Not in distress.  Neck:     Thyroid: Thyromegaly (R lobe enlarged >> L w/ tracheal deviation to the L) present.     Vascular: JVD normal.  Pulmonary:     Effort: Pulmonary effort is normal.     Breath sounds: No wheezing. No rales.  Cardiovascular:     Normal rate. Regular rhythm. Normal S1. Normal S2.     Murmurs: There is no murmur.  Edema:    Peripheral edema absent.  Abdominal:     Palpations: Abdomen is soft. There is no hepatomegaly.  Skin:    General: Skin is warm and dry.  Neurological:     General: No focal deficit present.     Mental Status: Alert and oriented to person, place and time.     Cranial Nerves: Cranial nerves are intact.          ASSESSMENT & PLAN:    1. HFrEF (heart failure with reduced ejection fraction) (HCC) EF 20-25 by echocardiogram 3/22.  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  He is not currently having anginal symptoms.  Overall, his volume status appears stable.  I reviewed his hospital records.  It was felt that he is not a good candidate for invasive ischemic  evaluation given his baseline dementia.  He would not be able to take dual antiplatelet therapy should he require PCI.  His blood pressure will not be able to tolerate the addition of hydralazine or nitrates.  Continue current dose  of empagliflozin, metoprolol succinate, furosemide.  Obtain follow-up BMET today.  Follow-up in 6 weeks.  We have provided his daughter with the name of our social worker and the heart failure clinic to see if she can give her some assistance in getting care for the patient.  2. Dilated aortic root (HCC) 47 mm on echocardiogram.  I will review further with Dr. Marlou Porch whether or not we should attempt to get a CT scan.  Blood pressure is well controlled.  3. Essential hypertension The patient's blood pressure is controlled on his current regimen.  Continue current therapy.   4. Mixed hyperlipidemia Continue atorvastatin.  5. Goiter He has a fairly large goiter on exam.  In review of his chart, he had recorder documented in 2010.  He will need follow-up with primary care to have this reevaluated.       Dispo:  Return in about 6 weeks (around 11/10/2020) for Routine Follow Up with Dr. Marlou Porch, in person.   Medication Adjustments/Labs and Tests Ordered: Current medicines are reviewed at length with the patient today.  Concerns regarding medicines are outlined above.  Tests Ordered: Orders Placed This Encounter  Procedures  . Basic Metabolic Panel (BMET)   Medication Changes: Meds ordered this encounter  Medications  . metoprolol succinate (TOPROL-XL) 25 MG 24 hr tablet    Sig: Take 1 tablet (25 mg total) by mouth daily.    Dispense:  90 tablet    Refill:  3  . furosemide (LASIX) 40 MG tablet    Sig: Take 1 tablet (40 mg total) by mouth daily.    Dispense:  90 tablet    Refill:  3  . empagliflozin (JARDIANCE) 10 MG TABS tablet    Sig: Take 1 tablet (10 mg total) by mouth daily before breakfast.    Dispense:  90 tablet    Refill:  3  . atorvastatin (LIPITOR)  80 MG tablet    Sig: Take 1 tablet (80 mg total) by mouth at bedtime.    Dispense:  90 tablet    Refill:  3    Signed, Richardson Dopp, PA-C  09/29/2020 [facility] [place], [place], [place]  [address] Phone: [phone]; Fax: [phone]

## 2020-09-29 ENCOUNTER — Ambulatory Visit: Payer: Medicare PPO | Admitting: Physician Assistant

## 2020-09-29 ENCOUNTER — Other Ambulatory Visit: Payer: Self-pay

## 2020-09-29 ENCOUNTER — Encounter: Payer: Self-pay | Admitting: Physician Assistant

## 2020-09-29 VITALS — BP 98/52 | HR 62 | Ht 74.0 in | Wt 128.6 lb

## 2020-09-29 DIAGNOSIS — I77810 Thoracic aortic ectasia: Secondary | ICD-10-CM | POA: Diagnosis not present

## 2020-09-29 DIAGNOSIS — E049 Nontoxic goiter, unspecified: Secondary | ICD-10-CM | POA: Diagnosis not present

## 2020-09-29 DIAGNOSIS — I5020 Unspecified systolic (congestive) heart failure: Secondary | ICD-10-CM | POA: Diagnosis not present

## 2020-09-29 DIAGNOSIS — I10 Essential (primary) hypertension: Secondary | ICD-10-CM

## 2020-09-29 DIAGNOSIS — E782 Mixed hyperlipidemia: Secondary | ICD-10-CM | POA: Diagnosis not present

## 2020-09-29 MED ORDER — FUROSEMIDE 40 MG PO TABS: 40.0000 mg | ORAL_TABLET | Freq: Every day | ORAL | 3 refills | Status: DC

## 2020-09-29 MED ORDER — ATORVASTATIN CALCIUM 80 MG PO TABS: 80.0000 mg | ORAL_TABLET | Freq: Every day | ORAL | 3 refills | Status: DC

## 2020-09-29 MED ORDER — EMPAGLIFLOZIN 10 MG PO TABS: 10.0000 mg | ORAL_TABLET | Freq: Every day | ORAL | 3 refills | Status: DC

## 2020-09-29 MED ORDER — METOPROLOL SUCCINATE ER 25 MG PO TB24: 25.0000 mg | ORAL_TABLET | Freq: Every day | ORAL | 3 refills | Status: DC

## 2020-09-29 NOTE — Patient Instructions (Addendum)
Medication Instructions:  Your physician recommends that you continue on your current medications as directed. Please refer to the Current Medication list given to you today.  1.  All medications were filled today # 90 to requested pharmacy.  *If you need a refill on your cardiac medications before your next appointment, please call your pharmacy*   Lab Work: Your physician recommends that you lab work today. BMET.   If you have labs (blood work) drawn today and your tests are completely normal, you will receive your results only by: Marland Kitchen MyChart Message (if you have MyChart) OR . A paper copy in the mail If you have any lab test that is abnormal or we need to change your treatment, we will call you to review the results.   Testing/Procedures: -None   Follow-Up: At [facility] [place], you and your health needs are our priority.  As part of our continuing mission to provide you with exceptional heart care, we have created designated Provider Care Teams.  These Care Teams include your primary Cardiologist (physician) and Advanced Practice Providers (APPs -  Physician Assistants and Nurse Practitioners) who all work together to provide you with the care you need, when you need it.  We recommend signing up for the patient portal called "MyChart".  Sign up information is provided on this After Visit Summary.  MyChart is used to connect with patients for Virtual Visits (Telemedicine).  Patients are able to view lab/test results, encounter notes, upcoming appointments, etc.  Non-urgent messages can be sent to your provider as well.   To learn more about what you can do with MyChart, go to [email or web address].    Your next appointment:   6 week(s)  The format for your next appointment:   In Person  Provider:   Candee Furbish, MD   Other Instructions Please follow up with PCP on large goiter.  Daughter was given work note today.    [place] please call Raquel Sarna, social worker to  advise on patients condition at [phone], tell Kennyth Lose your name and the patient's name and date of birth.

## 2020-09-30 ENCOUNTER — Other Ambulatory Visit: Payer: Self-pay

## 2020-09-30 ENCOUNTER — Telehealth (HOSPITAL_COMMUNITY): Payer: Self-pay | Admitting: Licensed Clinical Social Worker

## 2020-09-30 ENCOUNTER — Other Ambulatory Visit: Payer: Self-pay | Admitting: *Deleted

## 2020-09-30 DIAGNOSIS — I509 Heart failure, unspecified: Secondary | ICD-10-CM

## 2020-09-30 LAB — BASIC METABOLIC PANEL
BUN/Creatinine Ratio: 17 (ref 10–24)
BUN: 28 mg/dL — ABNORMAL HIGH (ref 8–27)
CO2: 22 mmol/L (ref 20–29)
Calcium: 9.7 mg/dL (ref 8.6–10.2)
Chloride: 103 mmol/L (ref 96–106)
Creatinine, Ser: 1.69 mg/dL — ABNORMAL HIGH (ref 0.76–1.27)
Glucose: 122 mg/dL — ABNORMAL HIGH (ref 65–99)
Potassium: 3.8 mmol/L (ref 3.5–5.2)
Sodium: 140 mmol/L (ref 134–144)
eGFR: 40 mL/min/{1.73_m2} — ABNORMAL LOW (ref >59)
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
## 2020-10-01 ENCOUNTER — Telehealth: Payer: Self-pay | Admitting: *Deleted

## 2020-10-01 ENCOUNTER — Other Ambulatory Visit: Payer: Self-pay | Admitting: *Deleted

## 2020-10-01 DIAGNOSIS — I5020 Unspecified systolic (congestive) heart failure: Secondary | ICD-10-CM

## 2020-10-01 DIAGNOSIS — I77810 Thoracic aortic ectasia: Secondary | ICD-10-CM

## 2020-10-01 MED ORDER — FUROSEMIDE 40 MG PO TABS
20.0000 mg | ORAL_TABLET | Freq: Every day | ORAL | 3 refills | Status: DC
Start: 2020-10-01 — End: 2021-09-28

## 2020-10-01 NOTE — Telephone Encounter (Signed)
   Telephone encounter was:  Successful.  10/01/2020 Name: [name] MRN: [id number] DOB: 1940-09-08  [name] is a 80 y.o. year old male who is a primary care patient of Richarda Osmond, DO . The community resource team was consulted for assistance with [place] guide performed the following interventions: Patient provided with information about care guide support team and interviewed to confirm resource needs.  Follow Up Plan:  No further follow up planned at this time. The patient has been provided with needed resources. Daughter says someone took application for moms meals and I provided my call back information if she does not here from [place], Care Management  [phone] [address] , [place] [address] Email : [email or web address]

## 2020-10-01 NOTE — Chronic Care Management (AMB) (Signed)
  Care Management   Note  10/01/2020 Name: [name] MRN: [id number] DOB: 1940-11-04  [name] is a 80 y.o. year old male who is a primary care patient of Richarda Osmond, DO. I reached out to [name] by phone today in response to a referral sent by [name]'s PCP, Richarda Osmond, DO.    [name] was given information about care management services today including:  1. Care management services include personalized support from designated clinical staff supervised by his physician, including individualized plan of care and coordination with other care providers 2. 24/7 contact phone numbers for assistance for urgent and routine care needs. 3. The patient may stop care management services at any time by phone call to the office staff.  Patient agreed to services and verbal consent obtained.   Follow up plan: Telephone appointment with care management team member scheduled for:  Licensed Clinical Social Worker 10/07/2020 RNCM 10/10/2020  [place] [facility]

## 2020-10-07 ENCOUNTER — Ambulatory Visit: Payer: Medicare PPO | Admitting: Licensed Clinical Social Worker

## 2020-10-07 ENCOUNTER — Encounter: Payer: Self-pay | Admitting: Licensed Clinical Social Worker

## 2020-10-07 DIAGNOSIS — Z8659 Personal history of other mental and behavioral disorders: Secondary | ICD-10-CM

## 2020-10-07 DIAGNOSIS — Z0189 Encounter for other specified special examinations: Secondary | ICD-10-CM

## 2020-10-07 NOTE — Chronic Care Management (AMB) (Signed)
Care Management Clinical Social Work  [facility]    10/07/2020 Name: [name] MRN: [id number] DOB: Jun 30, 1940  [name] is a 80 y.o. year old male who sees Richarda Osmond, DO for primary care.    Engaged with patient's daughter by telephone for initial visit in response to a referral from Dr. McDiarmid for social work care coordination services to assess needs, support and barriers to care prior to [name] clinic appointment  Consent to Services:  [name] daughter was given information about Care Management services today including:  1. Care Management services includes personalized support from designated clinical staff supervised by his physician, including individualized plan of care and coordination with other care providers 2. 24/7 contact phone numbers for assistance for urgent and routine care needs. 3. The patient may stop case management services at any time by phone call to the office staff.  Patient's daughter agreed to services and consent obtained.    (Look under patient's history for social information from assessment) Assessment:Patient's daughter [name] provided all information during this encounter. Patient continues to experience difficulty with decline in memory.., self care, IADL's etc.  Daughter is unsure if patient has Medicaid, she called DSS but they would not tell her. States they are suppose to mail the card to the house, he applied for Medicaid when he was at rehab for skilled care ( however this maybe skilled level or facility Medicaid). See Care Plan below for interventions and patient self-care actives. Recent life changes : Patient is now receiving Mom's Meals. Recommendation: Patient may benefit from, and daughter is in agreement to f/u with insurance provider to see if patient has enhanced benefits, as well as explore guardianship to see if this is an option. ( see noted from [place], Marlinda Mike 09/30/20 at Heart failure clinic)     Follow up Plan: Patient would like continued follow-up.  CCM LCSW will follow up with patient 's daughter after [facility] clinic appointment. will provide additional resocuces at that time.. Patient will call office if needed prior to next encounter.   Review of patient past medical history, allergies, medications, and health status, including review of relevant consultants reports was performed today as part of a comprehensive evaluation and provision of chronic care management and care coordination services.  SDOH(Social Determinants of Health) assessments and interventions performed:  SDOH Interventions   Flowsheet Row Most Recent Value  SDOH Interventions   Housing Interventions --  [information provide by [place]]      Advanced Directives Status: Not ready or willing to discuss.  Care Plan  Conditions to be addressed/monitored: Dementia; Level of care concerns and Memory Deficits  Care Plan : General Social Work (Adult)  Updates made by [name] Cane, LCSW since 10/07/2020 12:00 AM  Problem: Caregiver Stress   Goal: Caregiver Coping Optimized   Start Date: 10/07/2020  This Visit's Progress: On track  Priority: High  Current barriers:   .  Level of care concerns, Medication procurement, and Memory Deficits Clinical Goals: Patient's daughter will work with LCSW to address needs related to caregiver resources for patient  Clinical Interventions:  . Collaboration with Richarda Osmond, DO regarding development and update of comprehensive plan of care as evidenced by provider attestation and co-signature . Inter-disciplinary care team collaboration (see longitudinal plan of care) . Assessment of needs, barriers , agencies contacted, as well as how impacting  . Review various resources, discussed options and provided patient's daughter information about Department of Social Services ([employer], [place],  and utilities assistance), Transportation provided by insurance provider, Enhanced  Benefits connected with insurance provider, Private pay options home health needs, and Meals on wheels. Also discussed HPOA and guardianship.   . Additional information and packet for petition for guardianship papers will be given to daughter during [place] clinic) . Collaborated with Tressia Danas at heart failure clinic as patient's daughter has spoke to her about assist with getting a medication dispenser with an alarm . Other interventions provided:Active listening / Reflection utilized , Emotional Supportive Provided, Problem Solving /Task Center , and Caregiver stress acknowledged  Patient Goals/Self-Care Activities: Over the next 30 days . Call Department of Social Services [phone] to apply for and inquire about Medicaid . Call your insurance provider to discuss transportation options . Call your insurance provider for more information about your Enhanced Benefits  . Keep appointment with provider on 10/09/2020  for [facility]       Allergies  Allergen Reactions  . Ace Inhibitors Swelling and Other (See Comments)    Site of swelling not known by the patient  . Valsartan Swelling and Other (See Comments)    Site of swelling not known by the patient   Outpatient Encounter Medications as of 10/07/2020  Medication Sig  . aspirin 81 MG chewable tablet Chew 1 tablet (81 mg total) by mouth daily.  Marland Kitchen atorvastatin (LIPITOR) 80 MG tablet Take 1 tablet (80 mg total) by mouth at bedtime.  . empagliflozin (JARDIANCE) 10 MG TABS tablet Take 1 tablet (10 mg total) by mouth daily before breakfast.  . furosemide (LASIX) 40 MG tablet Take 0.5 tablets (20 mg total) by mouth daily.  . metoprolol succinate (TOPROL-XL) 25 MG 24 hr tablet Take 1 tablet (25 mg total) by mouth daily.   No facility-administered encounter medications on file as of 10/07/2020.   Patient Active Problem List   Diagnosis Date Noted  . Chronic HFrEF (heart failure with reduced ejection fraction) ([place])   . Peripheral edema   .  Cognitive decline   . Lives alone   . High risk social situation   . Cellulitis 08/23/2020  . Elevated troponin 08/23/2020  . History of dementia 08/11/2020  . Murmur, cardiac 08/11/2020  . Episodic tension-type headache, not intractable 12/07/2019  . Swelling around both eyes 07/18/2019  . Weight loss 02/06/2019  . B12 deficiency 07/21/2017  . Osteoarthritis 05/12/2017  . Hyperlipidemia 12/20/2016  . Memory loss 07/06/2016  . Insomnia 01/09/2015  . Diabetes mellitus ([place]) 06/05/2014  . 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 ANEMIA-IRON DEFICIENCY 07/07/2009  . Essential hypertension 04/21/2007  . Chronic urticaria 04/21/2007   [name], [place] / [place]   [phone] 2:48 PM

## 2020-10-07 NOTE — Patient Instructions (Signed)
Licensed Clinical Social Worker Visit Information  Goals we discussed today:  Goals Addressed            This Visit's Progress   . Caregiver Coping Optimized       Patient Goals/Self-Care Activities:  . Call Department of Social Services [phone] to apply for and inquire about Medicaid . Call your insurance provider to discuss transportation options . Call your insurance provider for more information about your Enhanced Benefits  . Keep appointment with provider on 10/09/2020 for [place] provided: Yes: caregiver information  [name] was given information about Care Management services today including:  1. Care Management services include personalized support from designated clinical staff supervised by his physician, including individualized plan of care and coordination with other care providers 2. 24/7 contact phone numbers for assistance for urgent and routine care needs. 3. The patient may stop Care Management services at any time by phone call to the office staff.  Patient's daughter verbally agreed to assistance and services provided by embedded care coordination/care management team today.  Patient verbalizes understanding of instructions provided today and agrees to view in [place].   Follow up plan: LCSW will f/u with patient's daughter after [facility] clinic appointment   [name], [place] [facility] Coord[facility]  [phone]

## 2020-10-08 ENCOUNTER — Encounter: Payer: Self-pay | Admitting: Family Medicine

## 2020-10-08 DIAGNOSIS — Z9114 Patient's other noncompliance with medication regimen: Secondary | ICD-10-CM | POA: Insufficient documentation

## 2020-10-08 DIAGNOSIS — R636 Underweight: Secondary | ICD-10-CM | POA: Insufficient documentation

## 2020-10-08 DIAGNOSIS — I255 Ischemic cardiomyopathy: Secondary | ICD-10-CM

## 2020-10-08 DIAGNOSIS — F17200 Nicotine dependence, unspecified, uncomplicated: Secondary | ICD-10-CM | POA: Insufficient documentation

## 2020-10-08 DIAGNOSIS — I5020 Unspecified systolic (congestive) heart failure: Secondary | ICD-10-CM

## 2020-10-08 DIAGNOSIS — I77810 Thoracic aortic ectasia: Secondary | ICD-10-CM | POA: Insufficient documentation

## 2020-10-08 HISTORY — DX: Unspecified systolic (congestive) heart failure: I25.5

## 2020-10-08 HISTORY — DX: Unspecified systolic (congestive) heart failure: I50.20

## 2020-10-09 ENCOUNTER — Other Ambulatory Visit: Payer: Self-pay

## 2020-10-09 ENCOUNTER — Ambulatory Visit: Payer: Medicare PPO | Admitting: Family Medicine

## 2020-10-09 VITALS — BP 122/72 | HR 61 | Ht 74.0 in | Wt 125.0 lb

## 2020-10-09 DIAGNOSIS — E049 Nontoxic goiter, unspecified: Secondary | ICD-10-CM | POA: Diagnosis not present

## 2020-10-09 DIAGNOSIS — G309 Alzheimer's disease, unspecified: Secondary | ICD-10-CM

## 2020-10-09 DIAGNOSIS — F0391 Unspecified dementia with behavioral disturbance: Secondary | ICD-10-CM | POA: Diagnosis not present

## 2020-10-09 DIAGNOSIS — R2689 Other abnormalities of gait and mobility: Secondary | ICD-10-CM | POA: Diagnosis not present

## 2020-10-09 NOTE — Progress Notes (Signed)
I have interviewed and examined the patient with Dr [name].  I have discussed the case with Dr. Ouida [name].   I agree with their documentation and management in their Geriatrics Consultation note.   I interviewed [name], Mr. [name] daughter and primary caretaker separqately from [name].  [name] reports that she is the only family member who will physically help [name].  He is estranged from his son after [name] was verbally degrading to him.  [name] does have a sister with whom he will talk on the phone. She is unable to assist in [name]'s care.  [name] lives alone in supported senior housing.   [name] believes there has been significant, steady decline in her father's memory and changes in his behaviors over the last several years.  He has difficulty remembering conversation with [name], often calling several times a day and late at night asking the same questions.  He has gotten lost in his bank, forgetting where he was and the reason he was there.  He wanders the isles at his grocery store, seeming to have difficulty with the task of shopping.  He has randomly taken his pills from his pill box, which his [name] arranges for him.  He forgets dates and appointments.  [name] must keep reminding him.  He has made duplicate payments to his landlords of his monthly rent.  He has expressed belief that there are "boys" who come into his home and eat his food and drink his water.  He will not eat the foods he believes the "boys" have touched.  He does not cook.  He purchases canned foods, like Spam, Vienna sausages, Pork and Beans.  [employer] has recently started delievery of meals to [name], but [name] is concenred that he has just been putting them in the refrigerator and not eating them.  He has repeatedly answered the door naked when his daughter knocks.   He becomes agitated quickly if his daughter tries to help him.  He is incontinent  of urine and his bed linen are often soiled.  He no longer takes baths, rather his washes at the sink.  There was a recent episode of [name] being found lost in his own apartment parking lot asking for help to get home. He had been diagnosed with a Urinary tract infection a couple days previously.   [name] was evaluated by Dr [name] (Neurology) for intractable headaches last September. She had concerns that [name] may have dementia at that time.  Dr [name] spoke with family members encouraging them to bring [name] in to see her again for evaluation of this possible condition.   [name] reports that her father is dependent for food preparation, laundry, transportation, medication administration, and handling his finances.  He is independent in his abilities to dress, bath, feed himself, ambulation and transfers.     Assessment and Plan  Dementia, mild-to-moderate, with behavioral disturbances - [name] meets the DSM-V criteria for Dementia with behavioral disturbances.  His behavioral disturbances include irritability, paranoid beliefs, disinhibition, and resistance to care.  - [name] has not given power of attorney over his property to [name] which makes it difficult for her to assist him in managing his finances.  He does not have advanced directives including health care power of attorney.  I believe [name] currently  has the mental capacity to make decisions in his own  best interests with respect to financial and healthcare should he choose to execute personal and property powers of attorney and advanced directives.  I am not certain how long this capacity will persists as his dementing neurologic process progresses.  - I would recommend a brain MRI given the rapid progression of [name]'s cognitive decline.  -[name] signed permission for a referral on her behalf to the Alzheimer's Association, [place] for education and support.  - Ms [name] Lanius, LCSW of our care management team is working with [name] as well.    I have reviewed our pharmacy team's note and agree with their findings and recommendations.    [name] will have follow up soon with Dr [name].    59 minutes face to face where spent in total with counseling / coordination of care took more than 50% of the total time. Counseling involved discussion differential diagnosis, testing, prognosis, adherence, risk reduction, benefits of treatment, instructions, compliance.  Care was coordinated with our Education officer, museum and Pharmacy team who also saw patient during his visit.  I discussed his case with the pharmacy team.

## 2020-10-09 NOTE — Assessment & Plan Note (Signed)
patient failed 4 stage STEADI balance test.

## 2020-10-09 NOTE — Progress Notes (Signed)
Provider:  Lissa Morales, MD Location:      Place of Service:     PCP: Richarda Osmond, DO Patient Care Team: Richarda Osmond, DO as PCP - General Jerline Pain, MD as PCP - Cardiology (Cardiology) Kennith Gain, MD as Consulting Physician (Allergy) Maurine Cane, LCSW as Social Worker (Licensed Clinical Social Worker) [name], RN as [facility]  Extended Emergency Contact Information Primary Emergency Contact: [name] Mobile Phone: [phone] Relation: Daughter Secondary Emergency Contact: Kail[place], [place] Mobile Phone: [phone] Relation: Son  Code Status: DNR Goals of Care: Advanced Directive information Advanced Directives 10/09/2020  Does Patient Have a Medical Advance Directive? No  Would patient like information on creating a medical advance directive? No - Patient declined   [facility]:   Patient is accompanied by child- daughter Primary caregiver:  patient Patient's Currently living arrangement:  patient and spouse Patient information was obtained from historical medical records, imaging reports: head CT, lab reports and office notes  past medical records. History/Exam limitations:  mental status and confusion Primary Care Provider:   Doristine Mango, DO Referring provider:  Doristine Mango, DO Reason for referral:  Cognition assessment as well as safety assessment in regard to living situation and medications administration.  ----------------------------------------------------------------------------------------------------------------------------------------------------------------------------------------------------------------------------------------------------------------   HPI by problems:  Chief Complaint  Patient presents with  . Dementia    Compared to 5 to 10 years ago, how is the patient at:  Problems with Judgment, e.g., problem making decisions,  bad financial decisions, problems with thinking?  yes  Less interested in hobbies or previously enjoyed activities?  yes  Problem remembering things about family and friends e.g. names,  occupations, birthdays, addresses?  yes  Problem remembering conversations or news events a few days later?  yes  Problem remembering what day and month it is? yes  Problem with losing things?  yes  Problem learning to use a new gadget or machine around the house, e.g., cell phones, computer, microwave, remote control?  yes  Problem with handling money for shopping?  yes  Problem handling financial matters, e.g. their pension, checking, credit cards, dealing with the bank?  yes  Problem with getting lost in familiar places?  yes  Geriatric Depression Scale:  3 / 15   Outpatient Encounter Medications as of 10/09/2020  Medication Sig  . aspirin 81 MG chewable tablet Chew 1 tablet (81 mg total) by mouth daily.  Marland Kitchen atorvastatin (LIPITOR) 80 MG tablet Take 1 tablet (80 mg total) by mouth at bedtime.  . empagliflozin (JARDIANCE) 10 MG TABS tablet Take 1 tablet (10 mg total) by mouth daily before breakfast.  . furosemide (LASIX) 40 MG tablet Take 0.5 tablets (20 mg total) by mouth daily.  . metoprolol succinate (TOPROL-XL) 25 MG 24 hr tablet Take 1 tablet (25 mg total) by mouth daily.   No facility-administered encounter medications on file as of 10/09/2020.   Past Surgical History:  Procedure Laterality Date  . ADENOIDECTOMY    . CHOLECYSTECTOMY  1960  . COLON SURGERY     right hemi-colectomy  . COLONOSCOPY  multiple  . ESOPHAGOGASTRODUODENOSCOPY    . TONSILLECTOMY     Social History   Socioeconomic History  . Marital status: Divorced    Spouse name: Not on file  . Number of children: 3  . Years of education: 20  . Highest education level: High school graduate  Occupational History  . Occupation: retired    Comment: Administrator  Tobacco Use  . Smoking status: Current Every Day  Smoker    Packs/day: 1.00    Years: 30.00    Pack years: 30.00    Types: Cigarettes  . Smokeless tobacco: Former Systems developer    Types: Chew    Quit date: 06/07/2001  Vaping Use  . Vaping Use: Never used  Substance and Sexual Activity  . Alcohol use: No    Alcohol/week: 0.0 standard drinks    Comment: quit about 10 years ago   . Drug use: No  . Sexual activity: Yes    Partners: Female  Other Topics Concern  . Not on file  Social History Narrative   Lives in [place] alone   [place]: hangs out socially    Patient enjoys walking around the community to stay active.      Right handed   Caffeine: none       Social Information ( copy and paste under history) updated 10/07/20   patient lives alone  . Retired Sports coach . Primary family support persons is daughter, [name]    Difficulty affording medication: No    Strengths:Supportive family/friends      Family Members: Recruitment consultant (Dgt), Assunta Found (Son), Vevelyn Royals (Sister)      Basic Activities of Daily Living    Dressing:Self-care   Eating: Partial assistance receives Mom's Meals; able to cook basic things   Ambulation: Partial assistance uses a cane   Toileting: Partial assistance uses depends   Bathing: Self-care; daughter unsure or how well he is doing in this area       Instrumental Activities of Daily Living   Shopping: Partial assistance; daughter helps   House/Yard Work: Partial assistance; daughter helps   Administration of medications: Total assistance; Daughter sets up medication; calls or comes by to make sure he takes it   Finances: Partial assistance; will not allow daughter to help she is unsure how he is paying everything   Telephone: Partial assistance; has land line phone but usually doesn't answer it   Transportation: Total assistance; Daughter takes to all appointments       Caregivers in home: No   Social Determinants of Health   Financial Resource Strain: Medium Risk  . Difficulty of  Paying Living Expenses: Somewhat hard  Food Insecurity: No Food Insecurity  . Worried About Charity fundraiser in the Last Year: Never true  . Ran Out of Food in the Last Year: Never true  Transportation Needs: No Transportation Needs  . Lack of Transportation (Medical): No  . Lack of Transportation (Non-Medical): No  Physical Activity: Not on file  Stress: Not on file  Social Connections: Not on file   Cardiovascular Risk Factors: Carotid Disease, History of Smoking, Hypertension, Lipids and Smoker  Educational History: 12 years formal education Personal History of Seizures: No - Personal History of Stroke: No - Personal History of Head Trauma: No - not recently Personal History of Psychiatric Disorders: No -  Family History of Dementia: No -  Basic Activities of Daily Living  Dressing: Self-care Eating: Self-care Ambulation: Self-care Toileting: Self-care Bathing: Self-care  Instrumental Activities of Daily -  Patient states he performs these functions independently but his daughter endorses that she helps him with all of them.  Shopping: Self-care House/Yard Work: Dentist of medications: Self-care Finances: Self-care Telephone: Self-care Transportation: Self-care  Mobility Assist Devices: Cane 1- point  Caregivers in home: patient  Caregiver Stress Self-Assessment (Zarit Score):  Was not completed out of 80 Scoring:  0-20 = Little/No stress       21-40 = Mild/Moderate stress       41-60 = Moderate/Severe stress      61-80 = Severe stress   Formal Home Health Assistance  Physical Therapy: yes  Occupational Therapy: no             Home Aid / Personal Care Service: yes             Homemaker services: yes  FALLS in last five office visits:  Fall Risk  10/10/2020 10/09/2020 08/11/2020 07/18/2019 02/13/2019  Falls in the past year? 0 0 0 0 0  Number falls in past yr: - 0 0 0 -  Injury with Fall? - - 0 - -  Follow up - - - Falls evaluation completed -     Health Maintenance reviewed: Immunization History  Administered Date(s) Administered  . Fluad Quad(high Dose 65+) 07/18/2019  . Influenza, High Dose Seasonal PF 03/23/2018  . Influenza,inj,Quad PF,6+ Mos 04/13/2016, 04/13/2017  . Pneumococcal Conjugate-13 11/07/2017  . Pneumococcal Polysaccharide-23 07/18/2019  . Tdap 07/15/2017   Health Maintenance Topics with due status: Overdue     Topic Date Due   COVID-19 Vaccine Never done   FOOT EXAM 11/08/2018   OPHTHALMOLOGY EXAM 11/10/2018   URINE MICROALBUMIN 06/23/2019    Diet: Regular poor Nutritional supplements: Boost  Geriatric Syndromes: Constipation- YES Laxative use:no   Incontinence no  Nocturia: no Dizziness no   Syncope no  Balance impairment:no    Skin problems no   Visual Impairment no   Hearing impairment no Dentures problems: no Dry mouth: no  Eating impairment no  Impaired Memory or Cognition yes   Behavioral problems no   Sleep problems no   Weight loss yes Drug Misadventure: no   Joint pain: yes Joint stiffness: yes Osteoporosis: no Pressure Ulcers: no Immobility: no Ankle edema: no History of UTIs: yes   Vital Signs Weight: 125 lb (56.7 kg) Body mass index is 16.05 kg/m. Estimated Creatinine Clearance: 27.5 mL/min (A) (by C-G formula based on SCr of 1.69 mg/dL (H)). Body surface area is 1.72 meters squared. Vitals:   10/09/20 1351  BP: 122/72  Pulse: 61  SpO2: 97%  Weight: 125 lb (56.7 kg)  Height: 6\' 2"  (1.88 m)   Wt Readings from Last 3 Encounters:  10/09/20 125 lb (56.7 kg)  09/29/20 128 lb 9.6 oz (58.3 kg)  08/27/20 134 lb 14.4 oz (61.2 kg)    Hearing Screening   Method: Audiometry   125Hz  250Hz  500Hz  1000Hz  2000Hz  3000Hz  4000Hz  6000Hz  8000Hz   Right ear:   Fail Fail Fail  Fail    Left ear:   Fail Fail Fail  Fail      Visual Acuity Screening   Right eye Left eye Both eyes  Without correction: 20/25 20/25 20/25   With correction:       Physical Examination:   Physical Exam Vitals and nursing note reviewed. Exam conducted with a chaperone present.  Constitutional:      General: He is not in acute distress.    Appearance: He is not ill-appearing or toxic-appearing.  HENT:     Head: Normocephalic.     Mouth/Throat:     Mouth: Mucous membranes are moist.  Eyes:     Conjunctiva/sclera: Conjunctivae normal.     Comments: Arcus sinalis  Cardiovascular:     Rate and Rhythm: Normal rate and regular rhythm.  Pulmonary:     Effort: Pulmonary effort is normal.  Breath sounds: Normal breath sounds.  Abdominal:     General: Abdomen is flat.     Palpations: Abdomen is soft.     Tenderness: There is no abdominal tenderness.  Skin:    General: Skin is warm and dry.     Capillary Refill: Capillary refill takes less than 2 seconds.     Findings: No rash.  Neurological:     General: No focal deficit present.     Mental Status: He is alert.     Cranial Nerves: No cranial nerve deficit or dysarthria.     Sensory: No sensory deficit.     Motor: Weakness and atrophy present.     Coordination: Romberg sign positive. Finger-Nose-Finger Test normal.     Gait: Gait abnormal and tandem walk abnormal.     Deep Tendon Reflexes: Reflexes are normal and symmetric.  Psychiatric:        Mood and Affect: Mood normal.        Behavior: Behavior normal.    Mini-Mental State Examination or Montreal Cognitive Assessment:  Patient did  require additional cues or prompts to complete tasks. Patient was mostly cooperative and attentive to testing tasks Patient did  appear motivated to perform well Score 6/30  6CIT Screen 12/05/2018  What Year? 0 points  What month? 0 points  What time? 0 points  Count back from 20 0 points  Months in reverse 0 points  Repeat phrase 0 points  Total Score 0    Labs  Lab Results  Component Value Date   VITAMINB12 238 02/18/2020    Lab Results  Component Value Date   FOLATE 9.7 02/18/2020    Lab Results  Component  Value Date   TSH 1.150 08/11/2020    No results found for: RPR  Lab Results  Component Value Date   HIV Reactive (A) 12/06/2019      Chemistry      Component Value Date/Time   NA 140 09/29/2020 1528   K 3.8 09/29/2020 1528   CL 103 09/29/2020 1528   CO2 22 09/29/2020 1528   BUN 28 (H) 09/29/2020 1528   CREATININE 1.69 (H) 09/29/2020 1528   CREATININE 1.06 04/21/2016 1442      Component Value Date/Time   CALCIUM 9.7 09/29/2020 1528   ALKPHOS 33 (L) 08/24/2020 0029   AST 23 08/24/2020 0029   ALT 15 08/24/2020 0029   BILITOT 0.7 08/24/2020 0029   BILITOT 0.3 08/11/2020 1033       Estimated Creatinine Clearance: 27.5 mL/min (A) (by C-G formula based on SCr of 1.69 mg/dL (H)).   Lab Results  Component Value Date   HGBA1C 5.6 08/11/2020     @10RELATIVEDAYS @  Hearing Screening   Method: Audiometry   125Hz  250Hz  500Hz  1000Hz  2000Hz  3000Hz  4000Hz  6000Hz  8000Hz   Right ear:   Fail Fail Fail  Fail    Left ear:   Fail Fail Fail  Fail      Visual Acuity Screening   Right eye Left eye Both eyes  Without correction: 20/25 20/25 20/25   With correction:      Lab Results  Component Value Date   WBC 3.4 (L) 08/24/2020   HGB 10.3 (L) 08/24/2020   HCT 32.8 (L) 08/24/2020   MCV 88.4 08/24/2020   PLT 170 08/24/2020    No results found for this or any previous visit (from the past 24 hour(s)).  Imaging Head CT: Brain: There is mild cerebral atrophy with widening of the extra-axial spaces and  ventricular dilatation. There are areas of decreased attenuation within the white matter tracts of the supratentorial brain, consistent with microvascular disease changes.  Brain MRI: ordered at this visit.  Assessment and Plan: Please see individual consultation notes from physical therapy, pharmacy and social work for today.    Problem List Items Addressed This Visit      Endocrine   Goiter (Multinodular) with tracheal deviation (Chronic)   Relevant Orders   US THYROID      Nervous and Auditory   Dementia with behavioral disturbance (HCC) - Primary (Chronic)     Other   Balance problem    patient failed 4 stage STEADI balance test.        Other Visit Diagnoses    Alzheimer's disease, unspecified (CODE) ([place])       Relevant Orders   MR Brain W Wo Contrast     Balance problem patient failed 4 stage STEADI balance test.    Teenager started smoking  Patient states: No problems with balance and falling No dementia  Primary Contact: [name] (Daughter) [phone] Recommended she go to alzheimer's association.  Patient to Follow up with  Dr.Ammie Warrick or [facility] in 2 week(s)  > 60 minutes face to face were spent in total with interdisciplinary discussion, patient and caretaker counseling and coordination of care took more than 20 minutes. The Geriatric interdisciplinary team meet to discuss the patient's assessment, problem list, and recommendations.  The interdisciplinary team consisted of representatives from medicine, pharmacy, physical therapy and social work. The interdisciplinary team meet with the patient and caretakers to review the team's findings, assessments, and recommendations.

## 2020-10-09 NOTE — Progress Notes (Signed)
S: Pharmacy consulted to complete medication reconciliation for geriatric clinic. Patient arrives in good spirits accompanied with his daughter. Pt brought medications with him today. Medication reconciliation completed by daughter who manages medications. Daughter reports pt has been adherent with medications since restarting in April 2022. Takes all medications in the morning. Daughter is working with heart failure clinic to purchase a medication dispenser (HF clinic willing to purchase for pt). Reports pill box is not helpful because pt may take multiple days without realizing.    Medication Issues Identified: None   Plan: 1. Continue medications as prescribed in collaboration with PCP and cardiology (heart failure team) 2. Recommend pt and daughter follow-up with heart failure clinic regarding medication dispenser   Patient seen by: Marlowe Alt PharmD Candidate, Norina Buzzard, PharmD - PGY-1 Resident. and Lorel Monaco, PharmD, BCPS - PGY2 Pharmacy Resident.

## 2020-10-09 NOTE — Patient Instructions (Addendum)
It was a pleasure to see you today!  To summarize our discussion for this visit:  We covered a lot of topics today which I will want to follow up soon but here is a summary of those  Confirm if eating meals from meals on wheels  Consider adult daycare  Consider hearing aid  Follow up on medication dispenser  Follow up with Ms Neoma Laming about guardianship process  Follow up on alzheimers association membership for daughter (support)  Add daily fiber supplement for constipation  We are ordering an ultrasound of thyroid and MRI of brain  Some additional health maintenance measures we should update are: Health Maintenance Due  Topic Date Due  . COVID-19 Vaccine (1) Never done  . FOOT EXAM  11/08/2018  . OPHTHALMOLOGY EXAM  11/10/2018  . URINE MICROALBUMIN  06/23/2019  .    Please return to our clinic to see me May 12th.  Call the clinic at [phone] if your symptoms worsen or you have any concerns.   Thank you for allowing me to take part in your care,  Dr. Doristine Mango

## 2020-10-10 ENCOUNTER — Ambulatory Visit: Payer: Medicare PPO

## 2020-10-10 ENCOUNTER — Encounter: Payer: Self-pay | Admitting: Family Medicine

## 2020-10-10 DIAGNOSIS — M2010 Hallux valgus (acquired), unspecified foot: Secondary | ICD-10-CM

## 2020-10-10 DIAGNOSIS — H9190 Unspecified hearing loss, unspecified ear: Secondary | ICD-10-CM | POA: Insufficient documentation

## 2020-10-10 DIAGNOSIS — Z636 Dependent relative needing care at home: Secondary | ICD-10-CM

## 2020-10-10 HISTORY — DX: Hallux valgus (acquired), unspecified foot: M20.10

## 2020-10-10 HISTORY — DX: Dependent relative needing care at home: Z63.6

## 2020-10-11 NOTE — Patient Instructions (Addendum)
[name]  it was nice speaking with you. Please call me directly [phone] if you have questions about the goals we discussed.  Goals Addressed            This Visit's Progress   . Track and Manage Symptoms-Heart Failure       Timeframe:  Long-Range Goal Priority:  High Start Date:   10/10/20                       Expected End Date:  01/02/21                     Follow Up Date 10/28/20    - develop a rescue plan - eat more whole grains, fruits and vegetables, lean meats and healthy fats - know when to call the doctor    Why is this important?    You will be able to handle your symptoms better if you keep track of them.   Making some simple changes to your lifestyle will help.   Eating healthy is one thing you can do to take good care of yourself.    Notes:        Patient Care Plan: RN Case Manager  Problem Identified: (Heart Failure)   Priority: High  Onset Date: 10/10/2020  Long-Range Goal: Caregiver patient will learn signs and symptoms of HF through monitoring for early detecttion to Prevented or Minimize Exacerbation   Start Date: 10/10/2020  Expected End Date: 01/02/2021  Priority: High  Current Barriers:  Marland Kitchen Knowledge deficit related to basic heart failure pathophysiology and self care management . Literacy Barriers- Daughter does not know a lot of information about  Heart Failure.  Case Manager Clinical Goal(s):  . Caregiver/patient will verbalize understanding of Heart Failure Action Plan and when to call doctor  Interventions:  . Collaboration with [name] Osmond, DO regarding development and update of comprehensive plan of care as evidenced by provider attestation and co-signature . Inter-disciplinary care team collaboration (see longitudinal plan of care) . Basic overview and discussion of pathophysiology of Heart Failure reviewed  . Provided verbal education on low sodium diet . Advised caregiver/patient to weigh each morning after emptying  bladder . Discussed importance of daily weight and advised patient to weigh and record daily- daughter stated she does not know if her fathers weighs daily . Reviewed role of diuretics in prevention of fluid overload and management of heart failure  Perform or review cognitive and/or health literacy screening with caregiver.  Sent daughter [name] educational material about heart failure  Informed daughter of [facility] calling to schedule MRI and Ultrasound . Reviewed medications with caregiver/patient and discussed importance of medication adherence-Daughter fills pill box and gives medication to her father every morning and takes the box with her so he will not take more medication than is prescribed. . Discussed plans with patient for ongoing care management follow up and provided patient with direct contact information for care management team  Review of patient status, including review of consultants reports, relevant laboratory and other test results, and medications completed.   Caregiver-Patient/Self-Care Activities: . Caregiver/Patient verbalizes understanding of plan to monitor signs an symptoms of Heart Failure . Self administers medications as prescribed . Attends all scheduled provider appointments . Calls pharmacy for medication refills . Calls provider office for new concerns or questions  Caregiver/Patient Goals: - follow rescue plan if symptoms flare-up - know when to call the doctor - call office if  I gain more than 2 pounds in one day or 5 pounds in one week - use salt in moderation - watch for swelling in feet, ankles and legs every day      [name] received Care Management services today:  1. Care Management services include personalized support from designated clinical staff supervised by his physician, including individualized plan of care and coordination with other care providers 2. 24/7 contact [phone] for assistance for urgent and  routine care needs. 3. Care Management are voluntary services and be declined at any time by calling the office.  The patient verbalized understanding of instructions provided today and declined a print copy of patient instruction materials.    Follow Up Plan: Patient would like continued follow-up.  CCM RNCM will outreach the patient within the next 2 weeks  Patient will call office if needed prior to next encounter\   [name] Arms, RN   [phone]

## 2020-10-11 NOTE — Chronic Care Management (AMB) (Signed)
Care Management    RN Visit Note  10/11/2020 Name: [name] MRN: [id number] DOB: Feb 09, 1941  Subjective: [name] is a 80 y.o. year old male who is a primary care patient of Richarda Osmond, DO. The care management team was consulted for assistance with disease management and care coordination needs.    Engaged with patient by telephone for initial visit in response to provider referral for case management and/or care coordination services.   Consent to Services:   [name] was given information about Care Management services today including:  1. Care Management services includes personalized support from designated clinical staff supervised by his physician, including individualized plan of care and coordination with other care providers 2. 24/7 contact phone numbers for assistance for urgent and routine care needs. 3. The patient may stop case management services at any time by phone call to the office staff.  Patient agreed to services and consent obtained.    Assessment: Patient is currently experiencing difficulty with Heart Failure and [name] his daughter [name] is his caregiver and is learniing how to take care of her father... See Care Plan below for interventions and patient self-care actives. Follow up Plan: Patient would like continued follow-up.  CCM RNCM will outreach the patient within the next 2 weeks  Patient will call office if needed prior to next encounter  Review of patient past medical history, allergies, medications, health status, including review of consultants reports, laboratory and other test data, was performed as part of comprehensive evaluation and provision of chronic care management services.   SDOH (Social Determinants of Health) assessments and interventions performed:    Care Plan  Allergies  Allergen Reactions  . Ace Inhibitors Swelling and Other (See Comments)    Site of swelling not known by the patient  . Valsartan  Swelling and Other (See Comments)    Site of swelling not known by the patient    Outpatient Encounter Medications as of 10/10/2020  Medication Sig  . aspirin 81 MG chewable tablet Chew 1 tablet (81 mg total) by mouth daily.  Marland Kitchen atorvastatin (LIPITOR) 80 MG tablet Take 1 tablet (80 mg total) by mouth at bedtime.  . empagliflozin (JARDIANCE) 10 MG TABS tablet Take 1 tablet (10 mg total) by mouth daily before breakfast.  . furosemide (LASIX) 40 MG tablet Take 0.5 tablets (20 mg total) by mouth daily.  . metoprolol succinate (TOPROL-XL) 25 MG 24 hr tablet Take 1 tablet (25 mg total) by mouth daily.   No facility-administered encounter medications on file as of 10/10/2020.    Patient Active Problem List   Diagnosis Date Noted  . Hallux abductovalgus with bunions 10/10/2020  . Hearing impairment 10/10/2020  . Caregiver stress 10/10/2020  . Balance problem 10/09/2020  . Underweight 10/08/2020  . Patient has difficulty administering medication 10/08/2020  . Tobacco dependence   . Aortic root dilatation ([place])   . 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 Chronic HFrEF (heart failure with reduced ejection fraction) ([place])   . Lives alone   . High risk social situation   . Episodic tension-type headache, not intractable 12/07/2019  . Weight loss 02/06/2019  . B12 deficiency 07/21/2017  . Osteoarthritis 05/12/2017  . Hyperlipidemia associated with type 2 diabetes mellitus ([place]) 12/20/2016  . Dementia with behavioral disturbance ([place]) 07/06/2016  . Insomnia 01/09/2015  . Type 2 diabetes mellitus with neurological complications ([place]) 73/22/0254  . 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  . Hypertension associated with diabetes ([place]) 04/21/2007  . Chronic urticaria 04/21/2007    Conditions to be addressed/monitored: CHF    Patient Care Plan: RN Case Manager  Problem Identified: (Heart  Failure)   Priority: High  Onset Date: 10/10/2020    Long-Range Goal: Caregiver patient will learn signs and symptoms of HF through monitoring for early detecttion to Prevented or Minimize Exacerbation   Start Date: 10/10/2020  Expected End Date: 01/02/2021  Priority: High  Note:   Current Barriers:  Marland Kitchen Knowledge deficit related to basic heart failure pathophysiology and self care management . Literacy Barriers- Daughter does not know a lot of information about  Heart Failure.  Case Manager Clinical Goal(s):  . Caregiver/patient will verbalize understanding of Heart Failure Action Plan and when to call doctor  Interventions:  . Collaboration with Richarda Osmond, DO regarding development and update of comprehensive plan of care as evidenced by provider attestation and co-signature . Inter-disciplinary care team collaboration (see longitudinal plan of care) . Basic overview and discussion of pathophysiology of Heart Failure reviewed  . Provided verbal education on low sodium diet . Advised caregiver/patient to weigh each morning after emptying bladder . Discussed importance of daily weight and advised patient to weigh and record daily- daughter stated she does not know if her fathers weighs daily . Reviewed role of diuretics in prevention of fluid overload and management of heart failure  Perform or review cognitive and/or health literacy screening with caregiver.  Sent daughter [name] educational material about heart failure  Informed daughter of  [facility] calling to schedule MRI and Ultrasound . Reviewed medications with caregiver/patient and discussed importance of medication adherence-Daughter fills pill box and gives medication to her father every morning and takes the box with her so he will not take more medication than is prescribed. . Discussed plans with patient for ongoing care management follow up and provided patient with direct contact information for care  management team  Review of patient status, including review of consultants reports, relevant laboratory and other test results, and medications completed.   Caregiver-Patient/Self-Care Activities: . Caregiver/Patient verbalizes understanding of plan to monitor signs an symptoms of Heart Failure . Self administers medications as prescribed . Attends all scheduled provider appointments . Calls pharmacy for medication refills . Calls provider office for new concerns or questions  Caregiver/Patient Goals: - follow rescue plan if symptoms flare-up - know when to call the doctor - call office if I gain more than 2 pounds in one day or 5 pounds in one week - use salt in moderation - watch for swelling in feet, ankles and legs every day    [name] RN, BSN, [place] Phone: [phone] I Fax: [phone]

## 2020-10-13 ENCOUNTER — Telehealth (HOSPITAL_COMMUNITY): Payer: Self-pay | Admitting: Licensed Clinical Social Worker

## 2020-10-13 NOTE — Telephone Encounter (Signed)
CSW contacted daughter [name] to discuss medication reminder pil box. Daughter is struggling with medications compliance with her father and asking for some support. CSW and Daughter viewed [name] and found a device that may work for medication reminders. CSW ordered and shipped to daughter's home and she will set up with patient. Daughter appreciative of the support and will contact CSW if needed. Raquel Sarna, [place], [place]

## 2020-10-15 ENCOUNTER — Other Ambulatory Visit: Payer: Medicare PPO | Admitting: *Deleted

## 2020-10-15 ENCOUNTER — Other Ambulatory Visit: Payer: Self-pay

## 2020-10-15 ENCOUNTER — Ambulatory Visit
Admission: RE | Admit: 2020-10-15 | Discharge: 2020-10-15 | Disposition: A | Discharge status: Home / Self Care | Payer: Medicare PPO | Source: Ambulatory Visit | Attending: Family Medicine | Admitting: Family Medicine

## 2020-10-15 DIAGNOSIS — I77810 Thoracic aortic ectasia: Secondary | ICD-10-CM

## 2020-10-15 DIAGNOSIS — I5020 Unspecified systolic (congestive) heart failure: Secondary | ICD-10-CM | POA: Diagnosis not present

## 2020-10-15 DIAGNOSIS — E049 Nontoxic goiter, unspecified: Secondary | ICD-10-CM | POA: Diagnosis not present

## 2020-10-16 ENCOUNTER — Ambulatory Visit: Payer: Medicare PPO | Admitting: Student in an Organized Health Care Education/Training Program

## 2020-10-16 ENCOUNTER — Encounter: Payer: Self-pay | Admitting: Student in an Organized Health Care Education/Training Program

## 2020-10-16 ENCOUNTER — Ambulatory Visit: Payer: Medicare PPO | Admitting: Licensed Clinical Social Worker

## 2020-10-16 DIAGNOSIS — E049 Nontoxic goiter, unspecified: Secondary | ICD-10-CM

## 2020-10-16 DIAGNOSIS — Z636 Dependent relative needing care at home: Secondary | ICD-10-CM

## 2020-10-16 DIAGNOSIS — R636 Underweight: Secondary | ICD-10-CM

## 2020-10-16 DIAGNOSIS — Z7189 Other specified counseling: Secondary | ICD-10-CM

## 2020-10-16 DIAGNOSIS — F17200 Nicotine dependence, unspecified, uncomplicated: Secondary | ICD-10-CM

## 2020-10-16 DIAGNOSIS — F0391 Unspecified dementia with behavioral disturbance: Secondary | ICD-10-CM | POA: Diagnosis not present

## 2020-10-16 DIAGNOSIS — Z719 Counseling, unspecified: Secondary | ICD-10-CM

## 2020-10-16 LAB — BASIC METABOLIC PANEL
BUN/Creatinine Ratio: 12 (ref 10–24)
BUN: 17 mg/dL (ref 8–27)
CO2: 21 mmol/L (ref 20–29)
Calcium: 9.3 mg/dL (ref 8.6–10.2)
Chloride: 108 mmol/L — ABNORMAL HIGH (ref 96–106)
Creatinine, Ser: 1.42 mg/dL — ABNORMAL HIGH (ref 0.76–1.27)
Glucose: 90 mg/dL (ref 65–99)
Potassium: 4.7 mmol/L (ref 3.5–5.2)
Sodium: 143 mmol/L (ref 134–144)
eGFR: 50 mL/min/{1.73_m2} — ABNORMAL LOW (ref >59)

## 2020-10-16 NOTE — Progress Notes (Signed)
    SUBJECTIVE:   CHIEF COMPLAINT / HPI:  geri follow up  POA- daughter is in contact with CCM team for assistance and has a phone appointment scheduled for later today. She is requesting medical forms in support of this.  Brain MRI- has not been scheduled yet  [facility]- daughter hasn't heard from them yet.   Thyroid US- completed as well as labs.   Food consumption- 131lbs from 125lbs last visit. Still eating processed foods primarily. Eats alone for all meals. Daughter thinks he's freezing the food from meals on wheels. Patient states he doesn't have much of an appetite.  Tobacco use- patient states that he doesn't smoke much and he will quit before our next visit. Daughter states that she does not know where he gets the cigarettes from and he smokes a lot.  OBJECTIVE:   BP 110/80   Pulse 84   Ht 6\' 2"  (1.88 m)   Wt 131 lb 3.2 oz (59.5 kg)   SpO2 98%   BMI 16.85 kg/m   Physical Exam Vitals and nursing note reviewed. Exam conducted with a chaperone present.  Constitutional:      General: He is not in acute distress.    Appearance: He is not ill-appearing, toxic-appearing or diaphoretic.  Cardiovascular:     Rate and Rhythm: Normal rate and regular rhythm.  Pulmonary:     Effort: Pulmonary effort is normal.     Breath sounds: Normal breath sounds.  Abdominal:     General: Abdomen is flat.     Palpations: Abdomen is soft.     Tenderness: There is no abdominal tenderness.  Skin:    General: Skin is warm and dry.     Capillary Refill: Capillary refill takes less than 2 seconds.  Neurological:     Mental Status: He is alert.  Psychiatric:        Mood and Affect: Mood normal.    ASSESSMENT/PLAN:   Dementia with behavioral disturbance ([place]) Reversible cause labs completed and non-contributory. Brain MRI has not been scheduled yet.  CCM assisting patient's daughter with POA process.  Patient is resistant to help and declines help in home or to go to a  daycare center which I think would be very helpful to him, especially to give him more motivation and monitoring around meals. Patient is open to talking about this again at next visit.  Tobacco dependence Spoke to patient again about cessation.   Goiter (Multinodular) with tracheal deviation [name] completed without nodule, diffusely enlarged thyroid.  TSH wnl  Underweight ~5lb weight gain since last appointment. Patient does not have an explanation. Continues to deny difficulty or pain with eating. Just denies having an appetite.      [place]

## 2020-10-16 NOTE — Patient Instructions (Signed)
Visit Information  Goals Addressed            This Visit's Progress   . Caregiver Coping Optimized   On track    Patient Goals/Self-Care Activities:  . Call insurance provider to discuss transportation options and Enhanced Benefits  . Call [facility] to follow up on caregiver resources and support . I will coordinate with your doctor to assist with getting the letter you requested . Complete Guardianship papers; contact DSS at the phone on the front of the packet with questions and consultation       Patient's daughter verbalizes understanding of instructions provided today.  She  has been provided with contact information for the care management team and has been advised to call with any health related questions or concerns.  Will f/u in 30 days or as needed.  [name], LCSW Care Management & Coordination  [phone]

## 2020-10-16 NOTE — Patient Instructions (Signed)
It was a pleasure to see you today!  To summarize our discussion for this visit:  I'm glad to hear that we are moving along with the imaging and legal processes we discussed at last visit.   Please follow up with our social work team later today to discuss what is needed going forward with POA process.   I will follow up with you after the brain MRI takes place.   I think it would be a great help to Mr. Lupe to be involved with an adult day center- I have had a good experience with [facility] [phone]  Some additional health maintenance measures we should update are: Health Maintenance Due  Topic Date Due  . COVID-19 Vaccine (1) Never done  . FOOT EXAM  11/08/2018  . OPHTHALMOLOGY EXAM  11/10/2018  . URINE MICROALBUMIN  06/23/2019  .    Call the clinic at [phone] if your symptoms worsen or you have any concerns.   Thank you for allowing me to take part in your care,  Dr. Doristine Mango

## 2020-10-16 NOTE — Chronic Care Management (AMB) (Signed)
Care Management   Clinical Social Work Note  10/16/2020 Name: [name] MRN: [id number] DOB: [birth year]  [name] is a 80 y.o. year old male who is a primary care patient of Richarda Osmond, DO. The CCM team was consulted to assist the patient with chronic disease management and/or care coordination needs related to: Level of Care Concerns and Caregiver Stress.   Collaboration with patient's daughter [name] for follow up visit in response to provider referral for social work chronic care management and care coordination services.   Consent to Services:  The patient was given information about Chronic Care Management services, agreed to services, and gave verbal consent prior to initiation of services.  Please see initial visit note for detailed documentation.   Patient agreed to services and consent obtained.   Assessment: Patient's daughter [name] provided all information during this encounter. Patient continues to experience difficulty with acceptance of memory issues and continue to try to handle all IADL.Marland KitchenMarland Kitchen See Care Plan below for interventions and patient self-care actives. Recent life changes /stressors: Patient tried to pay bills and lost money. Recommendation: Patient may benefit from, and daughter is in agreement to move forward with guardianship.  Follow up Plan: Patient's daughter would like continued follow-up.  CCM LCSW will follow up in 4 weeks.  Daughter will call office if needed prior to next encounter.    Review of patient past medical history, allergies, medications, and health status, including review of relevant consultants reports was performed today as part of a comprehensive evaluation and provision of chronic care management and care coordination services.     SDOH (Social Determinants of Health) assessments and interventions performed:    Advanced Directives Status: Not ready or willing to discuss.  CCM Care Plan  Allergies  Allergen  Reactions  . Ace Inhibitors Swelling and Other (See Comments)    Site of swelling not known by the patient  . Valsartan Swelling and Other (See Comments)    Site of swelling not known by the patient    Outpatient Encounter Medications as of 10/16/2020  Medication Sig  . aspirin 81 MG chewable tablet Chew 1 tablet (81 mg total) by mouth daily.  Marland Kitchen atorvastatin (LIPITOR) 80 MG tablet Take 1 tablet (80 mg total) by mouth at bedtime.  . empagliflozin (JARDIANCE) 10 MG TABS tablet Take 1 tablet (10 mg total) by mouth daily before breakfast.  . furosemide (LASIX) 40 MG tablet Take 0.5 tablets (20 mg total) by mouth daily.  . metoprolol succinate (TOPROL-XL) 25 MG 24 hr tablet Take 1 tablet (25 mg total) by mouth daily.   No facility-administered encounter medications on file as of 10/16/2020.    Patient Active Problem List   Diagnosis Date Noted  . Hallux abductovalgus with bunions 10/10/2020  . Hearing impairment 10/10/2020  . Caregiver stress 10/10/2020  . Balance problem 10/09/2020  . Underweight 10/08/2020  . Patient has difficulty administering medication 10/08/2020  . Tobacco dependence   . Aortic root dilatation ([place])   . 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 Chronic HFrEF (heart failure with reduced ejection fraction) ([place])   . Lives alone   . High risk social situation   . Episodic tension-type headache, not intractable 12/07/2019  . Weight loss 02/06/2019  . B12 deficiency 07/21/2017  . Osteoarthritis 05/12/2017  . Hyperlipidemia associated with type 2 diabetes mellitus ([place]) 12/20/2016  . Dementia with behavioral disturbance ([place]) 07/06/2016  . Insomnia 01/09/2015  .  Type 2 diabetes mellitus with neurological complications ([place]) 88/41/6606  . 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  . Hypertension associated with diabetes ([place]) 04/21/2007  . Chronic urticaria 04/21/2007     Conditions to be addressed/monitored: Dementia and Guardianship;  Care Plan : General Social Work (Adult)  Updates made by Maurine Cane, LCSW since 10/16/2020 12:00 AM  Problem: Caregiver Stress   Goal: Caregiver Coping Optimized   Start Date: 10/07/2020  This Visit's Progress: On track  Recent Progress: On track  Priority: High  Current barriers:  . Patient does not have HPOA  .  Level of care concerns, Medication procurement, and Memory Deficits Clinical Goals: Patient's daughter will work with LCSW to address needs related to caregiver resources for patient  Clinical Interventions:  . Inter-disciplinary care team collaboration (see longitudinal plan of care) . Assessment of needs, progress with care plan and barriers to care.  . Reminded of phone appointment with CCM RN . Review various resources, discussed options  Transportation provided by insurance provider, Enhanced Benefits connected with insurance provider, Dementia resources and support . Also reviewed progress with HPOA and guardianship.   . Daughter picked up packet for petition for guardianship during [place] clinic . Other interventions provided:Active listening / Reflection utilized , Emotional Supportive Provided, Problem Solving /Task Center , Caregiver stress acknowledged , Discussed Guardianship and reviewed process , and [place]  Patient Goals/Self-Care Activities: Over the next 30 days . Call insurance provider to discuss transportation options and Enhanced Benefits  . Call [facility] to follow up on caregiver resources and support . I will coordinate with your doctor to assist with getting the letter you requested . Complete Guardianship papers; contact DSS at the phone on the front of the packet with questions and consultation       [name], [place] / [place]   [phone] 11:56 AM

## 2020-10-17 ENCOUNTER — Telehealth: Payer: Self-pay | Admitting: Physician Assistant

## 2020-10-17 NOTE — Telephone Encounter (Signed)
Returned call, spoke w/ dtr. Recent lab work result reviewed w/ dtr who verbalized understanding.

## 2020-10-17 NOTE — Telephone Encounter (Signed)
Patient's daughter returning call for lab results. She states to call her home number: [phone]

## 2020-10-20 NOTE — Assessment & Plan Note (Signed)
Reversible cause labs completed and non-contributory. Brain MRI has not been scheduled yet.  CCM assisting patient's daughter with POA process.  Patient is resistant to help and declines help in home or to go to a daycare center which I think would be very helpful to him, especially to give him more motivation and monitoring around meals. Patient is open to talking about this again at next visit.

## 2020-10-20 NOTE — Assessment & Plan Note (Signed)
[name] completed without nodule, diffusely enlarged thyroid.  TSH wnl

## 2020-10-20 NOTE — Assessment & Plan Note (Signed)
Spoke to patient again about cessation.

## 2020-10-20 NOTE — Assessment & Plan Note (Signed)
~  5lb weight gain since last appointment. Patient does not have an explanation. Continues to deny difficulty or pain with eating. Just denies having an appetite.

## 2020-10-22 ENCOUNTER — Other Ambulatory Visit: Payer: Self-pay

## 2020-10-22 ENCOUNTER — Ambulatory Visit
Admission: RE | Admit: 2020-10-22 | Discharge: 2020-10-22 | Disposition: A | Discharge status: Home / Self Care | Payer: Medicare PPO | Source: Ambulatory Visit | Attending: Family Medicine | Admitting: Family Medicine

## 2020-10-22 DIAGNOSIS — G309 Alzheimer's disease, unspecified: Secondary | ICD-10-CM

## 2020-10-22 MED ORDER — GADOBENATE DIMEGLUMINE 529 MG/ML IV SOLN
12.0000 mL | Freq: Once | INTRAVENOUS | Status: AC | PRN
  Administered 2020-10-22: 12 mL via INTRAVENOUS

## 2020-10-28 ENCOUNTER — Telehealth: Payer: Medicare PPO

## 2020-10-28 ENCOUNTER — Telehealth: Payer: Self-pay

## 2020-10-28 NOTE — Telephone Encounter (Signed)
   RN Case Manager Care Management   Phone Outreach    10/28/2020 Name: [name] MRN: [id number] DOB: 19-Sep-1940  [name] is a 80 y.o. year old male who is a primary care patient of Richarda Osmond, DO .   Telephone outreach was unsuccessful A HIPPA compliant phone message was left for the patient providing contact information and requesting a return call.   Plan:Will route chart to Care Guide to see if patient would like to reschedule phone appointment   Review of patient status, including review of consultants reports, relevant laboratory and other test results, and collaboration with appropriate care team members and the patient's provider was performed as part of comprehensive patient evaluation and provision of care management services.    [name] RN, BSN, [facility] Care Management Coordinator [place] Phone: [phone] I Fax: [phone]

## 2020-10-29 ENCOUNTER — Telehealth: Payer: Self-pay | Admitting: *Deleted

## 2020-10-29 NOTE — Chronic Care Management (AMB) (Signed)
  Care Management   Note  10/29/2020 Name: [name] MRN: [id number] DOB: 1941/02/02  [name] is a 80 y.o. year old male who is a primary care patient of Richarda Osmond, DO and is actively engaged with the care management team. I reached out to [name] by phone today to assist with re-scheduling a follow up visit with the RN Case Manager  Follow up plan: Unsuccessful telephone outreach attempt made. A HIPAA compliant phone message was left for the patient providing contact information and requesting a return call.  The care management team will reach out to the patient again over the next 7 days.  If patient returns call to provider office, please advise to call [place] at [phone].  [facility]

## 2020-10-31 NOTE — Telephone Encounter (Signed)
Rescheduled

## 2020-10-31 NOTE — Chronic Care Management (AMB) (Signed)
  Care Management   Note  10/31/2020 Name: [name] MRN: [id number] DOB: 1941/04/22  [name] is a 80 y.o. year old male who is a primary care patient of Richarda Osmond, DO and is actively engaged with the care management team. I reached out to [name] by phone today to assist with re-scheduling a follow up visit with the RN Case Manager  Follow up plan: Telephone appointment with care management team member scheduled for:11/25/2020  [name]  Care Guide, Embedded Care Coordination [place]  Care Management

## 2020-11-04 ENCOUNTER — Encounter: Payer: Self-pay | Admitting: Family Medicine

## 2020-11-04 NOTE — Progress Notes (Signed)
Open Letter concerning capacity

## 2020-11-05 ENCOUNTER — Ambulatory Visit (INDEPENDENT_AMBULATORY_CARE_PROVIDER_SITE_OTHER): Payer: Medicare PPO | Admitting: Cardiology

## 2020-11-05 ENCOUNTER — Encounter: Payer: Self-pay | Admitting: Cardiology

## 2020-11-05 ENCOUNTER — Encounter: Payer: Self-pay | Admitting: *Deleted

## 2020-11-05 ENCOUNTER — Other Ambulatory Visit: Payer: Self-pay

## 2020-11-05 ENCOUNTER — Ambulatory Visit: Payer: Medicare PPO | Admitting: Licensed Clinical Social Worker

## 2020-11-05 VITALS — BP 100/50 | HR 54 | Ht 74.0 in | Wt 129.0 lb

## 2020-11-05 DIAGNOSIS — Z719 Counseling, unspecified: Secondary | ICD-10-CM

## 2020-11-05 DIAGNOSIS — Z636 Dependent relative needing care at home: Secondary | ICD-10-CM

## 2020-11-05 DIAGNOSIS — I5020 Unspecified systolic (congestive) heart failure: Secondary | ICD-10-CM | POA: Diagnosis not present

## 2020-11-05 DIAGNOSIS — I77810 Thoracic aortic ectasia: Secondary | ICD-10-CM | POA: Diagnosis not present

## 2020-11-05 DIAGNOSIS — I10 Essential (primary) hypertension: Secondary | ICD-10-CM

## 2020-11-05 DIAGNOSIS — Z7189 Other specified counseling: Secondary | ICD-10-CM

## 2020-11-05 NOTE — Chronic Care Management (AMB) (Signed)
Care Management   Clinical Social Work Note  11/05/2020 Name: [name] MRN: [id number] DOB: [birth year]  [name] is a 80 y.o. year old male who is a primary care patient of Richarda Osmond, DO. The CCM team was consulted to assist the patient with chronic disease management and/or care coordination needs related to: Level of Care Concerns and Caregiver Stress.   Collaboration with patient's daughter [name] for follow up visit in response to provider referral for social work chronic care management and care coordination services.   Consent to Services:  The patient was given information about Chronic Care Management services, agreed to services, and gave verbal consent prior to initiation of services.  Please see initial visit note for detailed documentation.   Patient's daughter agreed to services and consent obtained.   Assessment: Patient was accompanied by his daughter [name] who provided all /most information during this encounter..  Dr. McDiarmid provided a letter for daughter giving his opinion about patient's ability to act on his own. See Care Plan below for interventions and patient self-care actives.  Recommendation: Patient may benefit from, and daughter is in agreement to complete guardianship process.   Follow up Plan: Patient's daughter does not require or desire continued follow-up from LCSW. Will contact the office if needed, Patient may benefit from and daughter is in agreement for CCM LCSW to remain part of care team for the next 90 days.  If no needs are identified in the next 90 days, CCM LCSW will disconnect from the care team.  CCM RN will continue to provide support and consult with LCSW if needed.   Review of patient past medical history, allergies, medications, and health status, including review of relevant consultants reports was performed today as part of a comprehensive evaluation and provision of chronic care management and care coordination  services.     SDOH (Social Determinants of Health) assessments and interventions performed:    Advanced Directives Status: Not ready or willing to discuss.  CCM Care Plan  Allergies  Allergen Reactions  . Ace Inhibitors Swelling and Other (See Comments)    Site of swelling not known by the patient  . Valsartan Swelling and Other (See Comments)    Site of swelling not known by the patient    Outpatient Encounter Medications as of 11/05/2020  Medication Sig  . aspirin 81 MG chewable tablet Chew 1 tablet (81 mg total) by mouth daily.  Marland Kitchen atorvastatin (LIPITOR) 80 MG tablet Take 1 tablet (80 mg total) by mouth at bedtime.  . empagliflozin (JARDIANCE) 10 MG TABS tablet Take 1 tablet (10 mg total) by mouth daily before breakfast.  . furosemide (LASIX) 40 MG tablet Take 0.5 tablets (20 mg total) by mouth daily.  . metoprolol succinate (TOPROL-XL) 25 MG 24 hr tablet Take 1 tablet (25 mg total) by mouth daily.   No facility-administered encounter medications on file as of 11/05/2020.    Patient Active Problem List   Diagnosis Date Noted  . Hallux abductovalgus with bunions 10/10/2020  . Hearing impairment 10/10/2020  . Caregiver stress 10/10/2020  . Balance problem 10/09/2020  . Underweight 10/08/2020  . Patient has difficulty administering medication 10/08/2020  . Tobacco dependence   . Aortic root dilatation ([place])   . 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 Chronic HFrEF (heart failure with reduced ejection fraction) ([place])   . Lives alone   . High risk social situation   . Episodic tension-type  headache, not intractable 12/07/2019  . Weight loss 02/06/2019  . B12 deficiency 07/21/2017  . Osteoarthritis 05/12/2017  . Hyperlipidemia associated with type 2 diabetes mellitus ([place]) 12/20/2016  . Dementia with behavioral disturbance ([place]) 07/06/2016  . Insomnia 01/09/2015  . Type 2 diabetes mellitus with neurological complications ([place]) 90/24/0973   . 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  . Hypertension associated with diabetes ([place]) 04/21/2007  . Chronic urticaria 04/21/2007    Conditions to be addressed/monitored: Level of care concerns and Memory Deficits  Care Plan : General Social Work (Adult)  Updates made by Maurine Cane, LCSW since 11/05/2020 12:00 AM  Problem: Caregiver Stress   Goal: Caregiver Coping Optimized   Start Date: 10/07/2020  Recent Progress: On track  Priority: High  Current barriers:  . Patient does not have HPOA  .  Level of care concerns, Medication procurement, and Memory Deficits Clinical Goals: Patient's daughter will work with LCSW to address needs related to caregiver resources for patient  Clinical Interventions:  . Inter-disciplinary care team collaboration (see longitudinal plan of care) . Assessment of needs, progress with care plan and barriers to care.  . Daughter has not been able to complete packet for petition for guardianship; . Provider completed letter for caregiver to support petition for guardianship; left at front desk for pick up . Review various resources, discussed options  Transportation provided by insurance provider, Enhanced Benefits connected with insurance provider, Dementia resources and support . Also reviewed progress with HPOA and guardianship.   . Other interventions provided:Active listening; Problem Solving /Task Center , Caregiver stress acknowledged , Patient Goals/Self-Care Activities: Over the next 30 days . Call insurance provider to discuss transportation options and Enhanced Benefits  . Call [place] to follow up on caregiver resources and support . Complete Guardianship papers; contact DSS at the phone on the front of the packet with questions and consultation      [name], [place] / [place]   [phone] 1:37  PM

## 2020-11-05 NOTE — Patient Instructions (Signed)
Medication Instructions:  The current medical regimen is effective;  continue present plan and medications.  *If you need a refill on your cardiac medications before your next appointment, please call your pharmacy*  Follow-Up: At [facility], you and your health needs are our priority.  As part of our continuing mission to provide you with exceptional heart care, we have created designated Provider Care Teams.  These Care Teams include your primary Cardiologist (physician) and Advanced Practice Providers (APPs -  Physician Assistants and Nurse Practitioners) who all work together to provide you with the care you need, when you need it.  We recommend signing up for the patient portal called "MyChart".  Sign up information is provided on this After Visit Summary.  MyChart is used to connect with patients for Virtual Visits (Telemedicine).  Patients are able to view lab/test results, encounter notes, upcoming appointments, etc.  Non-urgent messages can be sent to your provider as well.   To learn more about what you can do with MyChart, go to [email or web address].    Your next appointment:   1 year(s)  The format for your next appointment:   In Person  Provider:   Mark Skains, MD   Thank you for choosing [facility]!!    

## 2020-11-05 NOTE — Patient Instructions (Signed)
Visit Information  Goals Addressed            This Visit's Progress   . Caregiver Coping Optimized   Not on track    Patient Goals/Self-Care Activities:  . Call insurance provider to discuss transportation options and Enhanced Benefits  . Call [place] [facility] to follow up on caregiver resources and support . Complete Guardianship papers; contact DSS at the phone on the front of the packet with questions and consultation       Patient's daughter verbalizes understanding of instructions provided today.  No further follow up required: by LCSW at this time  [name], [place]

## 2020-11-05 NOTE — Progress Notes (Signed)
Cardiology Office Note:    Date:  11/05/2020   ID:  [name], DOB [birth year], MRN [id number]  PCP:  Richarda Osmond, DO   [facility] Providers Cardiologist:  Candee Furbish, MD     Referring MD: Richarda Osmond, DO     History of Present Illness:    [name] is a 80 y.o. male here for follow-up of chronic systolic heart failure likely ischemic with conservative management.   (HFrEF) heart failure with reduced ejection fraction  ? Echocardiogram 3/22: EF 20-25, mild [name], mild to mod AI ? Likely ischemic - conservative mgmt due to dementia   Dilated aortic root (47 mm)  Hypertension   Hyperlipidemia   Diabetes mellitus   Gout   BPH   Dementia   MW Tear  B12 deficiency   Prior CV studies: Echocardiogram 08/24/20 EF 20-25, inf, inf-lat AK, ant, ant-lat, ant septum, inf-lat and apical lat HK, Gr 1 DD, mild reduced RVSF, mod LAE, trivial pericardial effusion, mild [name], mild to mod AI, mod dilation of aortic root (47 mm), mild dilation of ascending aorta (42 mm)  Overall has been doing quite well.  Lives by himself with daughter's assistance.  Denies any orthopnea fevers chills nausea vomiting chest pain.  His daughter assisted with historical facts.  Underlying dementia.  Past Medical History:  Diagnosis Date  . 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 Allergic rhinitis   . Anemia   . ANEMIA-IRON DEFICIENCY 07/07/2009   Qualifier: Diagnosis of  By: Carlean Purl MD, [place] Angio-edema    with ACEI and with ARB  . Aortic root dilatation ([place])   . B12 deficiency   . BPH (benign prostatic hyperplasia)    [name]  . Caregiver stress 10/10/2020   [name] (daughter) is sole and primary caregiver to [name].   . Cellulitis 08/23/2020  . Cognitive decline   . Diabetes ([place])   . Elevated troponin 08/23/2020  . Gout   . Hallux abductovalgus with bunions 10/10/2020  . Hyperlipidemia   .  Hypertension   . Mallory-Weiss tear   . Thyroid disorder   . 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 Urticaria     Past Surgical History:  Procedure Laterality Date  . ADENOIDECTOMY    . CHOLECYSTECTOMY  1960  . COLON SURGERY     right hemi-colectomy  . COLONOSCOPY  multiple  . ESOPHAGOGASTRODUODENOSCOPY    . TONSILLECTOMY      Current Medications: Current Meds  Medication Sig  . aspirin 81 MG chewable tablet Chew 1 tablet (81 mg total) by mouth daily.  Marland Kitchen atorvastatin (LIPITOR) 80 MG tablet Take 1 tablet (80 mg total) by mouth at bedtime.  . empagliflozin (JARDIANCE) 10 MG TABS tablet Take 1 tablet (10 mg total) by mouth daily before breakfast.  . furosemide (LASIX) 40 MG tablet Take 0.5 tablets (20 mg total) by mouth daily.  . metoprolol succinate (TOPROL-XL) 25 MG 24 hr tablet Take 1 tablet (25 mg total) by mouth daily.     Allergies:   Ace inhibitors and Valsartan   Social History   Socioeconomic History  . Marital status: Divorced    Spouse name: Not on file  . Number of children: 3  . Years of education: 55  . Highest education level: High school graduate  Occupational History  . Occupation: retired    Comment: Administrator   Tobacco Use  . Smoking  status: Current Every Day Smoker    Packs/day: 1.00    Years: 30.00    Pack years: 30.00    Types: Cigarettes  . Smokeless tobacco: Former Systems developer    Types: Chew    Quit date: 06/07/2001  Vaping Use  . Vaping Use: Never used  Substance and Sexual Activity  . Alcohol use: No    Alcohol/week: 0.0 standard drinks    Comment: quit about 10 years ago   . Drug use: No  . Sexual activity: Yes    Partners: Female  Other Topics Concern  . Not on file  Social History Narrative   Lives in [place] alone   [place]: hangs out socially    Patient enjoys walking around the community to stay active.      Right handed   Caffeine: none       Social Information ( copy and paste under history) updated 10/07/20   patient  lives alone  . Retired Sports coach . Primary family support persons is daughter, [name]    Difficulty affording medication: No    Strengths:Supportive family/friends      Family Members: [name] (Dgt), [name] (Son), [name] (Sister)      Basic Activities of Daily Living    Dressing:Self-care   Eating: Partial assistance receives Mom's Meals; able to cook basic things   Ambulation: Partial assistance uses a cane   Toileting: Partial assistance uses depends   Bathing: Self-care; daughter unsure or how well he is doing in this area       Instrumental Activities of Daily Living   Shopping: Partial assistance; daughter helps   House/Yard Work: Partial assistance; daughter helps   Administration of medications: Total assistance; Daughter sets up medication; calls or comes by to make sure he takes it   Finances: Partial assistance; will not allow daughter to help she is unsure how he is paying everything   Telephone: Partial assistance; has land line phone but usually doesn't answer it   Transportation: Total assistance; Daughter takes to all appointments       Caregivers in home: No   Social Determinants of Health   Financial Resource Strain: Medium Risk  . Difficulty of Paying Living Expenses: Somewhat hard  Food Insecurity: No Food Insecurity  . Worried About Charity fundraiser in the Last Year: Never true  . Ran Out of Food in the Last Year: Never true  Transportation Needs: No Transportation Needs  . Lack of Transportation (Medical): No  . Lack of Transportation (Non-Medical): No  Physical Activity: Not on file  Stress: Not on file  Social Connections: Not on file     Family History: The patient's family history includes Diabetes in an other family member. There is no history of Colon cancer.  ROS:   Please see the history of present illness.     All other systems reviewed and are negative.  EKGs/Labs/Other Studies Reviewed:    Recent  Labs: 08/11/2020: TSH 1.150 08/23/2020: B Natriuretic Peptide 985.6 08/24/2020: ALT 15; Hemoglobin 10.3; Platelets 170 10/15/2020: BUN 17; Creatinine, Ser 1.42; Potassium 4.7; Sodium 143  Recent Lipid Panel    Component Value Date/Time   CHOL 195 08/11/2020 1033   TRIG 55 08/11/2020 1033   HDL 38 (L) 08/11/2020 1033   CHOLHDL 5.1 (H) 08/11/2020 1033   CHOLHDL 6.1 08/02/2014 1423   VLDL 29 08/02/2014 1423   LDLCALC 147 (H) 08/11/2020 1033     Risk Assessment/Calculations:  Physical Exam:    VS:  BP (!) 100/50 (BP Location: Left Arm, Patient Position: Sitting, Cuff Size: Normal)   Pulse (!) 54   Ht 6\' 2"  (1.88 m)   Wt 129 lb (58.5 kg)   SpO2 97%   BMI 16.56 kg/m     Wt Readings from Last 3 Encounters:  11/05/20 129 lb (58.5 kg)  10/16/20 131 lb 3.2 oz (59.5 kg)  10/09/20 125 lb (56.7 kg)     GEN: Thin in no acute distress HEENT: Normal NECK: No JVD; No carotid bruits LYMPHATICS: No lymphadenopathy CARDIAC: RRR, no murmurs, rubs, gallops RESPIRATORY:  Clear to auscultation without rales, wheezing or rhonchi  ABDOMEN: Soft, non-tender, non-distended MUSCULOSKELETAL:  No edema; No deformity  SKIN: Warm and dry NEUROLOGIC:  Alert and oriented x 3 PSYCHIATRIC:  Normal affect   ASSESSMENT:    1. HFrEF (heart failure with reduced ejection fraction) ([place])   2. Dilated aortic root ([place])   3. Essential hypertension    PLAN:    In order of problems listed above:  Chronic systolic heart failure - EF 20 to 25% on most recent echocardiogram.  Personally reviewed.  Volume status is excellent.  Continue with current management which includes Jardiance 10 mg, atorvastatin 80 mg, furosemide 20 mg and metoprolol 25 mg.  Refills as needed.  Dilated aortic root - 47 mm on echocardiogram.  No further work-up necessary.  Conservative management.  Essential hypertension - Excellent control.  Mixed hyperlipidemia - Continue with atorvastatin.  1 year  follow-up.    Medication Adjustments/Labs and Tests Ordered: Current medicines are reviewed at length with the patient today.  Concerns regarding medicines are outlined above.  No orders of the defined types were placed in this encounter.  No orders of the defined types were placed in this encounter.   Patient Instructions  Medication Instructions:  The current medical regimen is effective;  continue present plan and medications.  *If you need a refill on your cardiac medications before your next appointment, please call your pharmacy*  Follow-Up: At [facility], you and your health needs are our priority.  As part of our continuing mission to provide you with exceptional heart care, we have created designated Provider Care Teams.  These Care Teams include your primary Cardiologist (physician) and Advanced Practice Providers (APPs -  Physician Assistants and Nurse Practitioners) who all work together to provide you with the care you need, when you need it.  We recommend signing up for the patient portal called "MyChart".  Sign up information is provided on this After Visit Summary.  MyChart is used to connect with patients for Virtual Visits (Telemedicine).  Patients are able to view lab/test results, encounter notes, upcoming appointments, etc.  Non-urgent messages can be sent to your provider as well.   To learn more about what you can do with MyChart, go to [email or web address].    Your next appointment:   1 year(s)  The format for your next appointment:   In Person  Provider:   Candee Furbish, MD   Thank you for choosing [facility]!!        Signed, Candee Furbish, MD  11/05/2020 12:37 PM    [place]

## 2020-11-25 ENCOUNTER — Ambulatory Visit: Payer: Medicare PPO | Admitting: Licensed Clinical Social Worker

## 2020-11-25 DIAGNOSIS — Z719 Counseling, unspecified: Secondary | ICD-10-CM

## 2020-11-25 DIAGNOSIS — Z7189 Other specified counseling: Secondary | ICD-10-CM

## 2020-11-25 NOTE — Patient Instructions (Signed)
Visit Information   Goals Addressed             This Visit's Progress    Caregiver Coping Optimized   Not on track    Patient Goals/Self-Care Activities:  Call insurance provider to discuss transportation options and Enhanced Benefits  Call [place] to follow up on caregiver resources and support Complete Guardianship papers;       Patient 's daughter verbalizes understanding of instructions provided today.  Telephone follow up appointment with care management team member scheduled for:12/16/2020  [name], [place] Management & Coordination  [phone]

## 2020-11-25 NOTE — Chronic Care Management (AMB) (Signed)
Care Management   Clinical Social Work Note  11/25/2020 Name: [name] MRN: [id number] DOB: 01-30-1941  [name] is a 80 y.o. year old male who is a primary care patient of Richarda Osmond, DO. The CCM team was consulted to assist the patient with chronic disease management and/or care coordination needs related to: Caregiver Stress and check in for CCM RN.  Collaboration with patient's daughter [name]  for follow up visit in response to provider referral for social work chronic care management and care coordination services.   Consent to Services: Patient's daughter agreed to services and consent obtained.   Assessment: Patient's daughter provided all information during this encounter. She reports patient is doing well at this time, no concerns from care plan goals with CCM RN or CCM LCSW, they have completed MRI and appointment with heart doctor listed in care plan for CCM RN. Marland Kitchen See Care Plan below for interventions and patient self-care actives.  Recommendation: Patient may benefit from, and daughter is in agreement to continue to follow up with guardianship process.  Follow up Plan: Patient's daughter would like continued follow-up from [facility] RN.  Appointment scheduled July 12th 2022.  Patient's daughter does not require or desire continued follow-up from LCSW. Will contact the office if needed.  LCSW will disconnect from care team at this time.   Review of patient past medical history, allergies, medications, and health status, including review of relevant consultants reports was performed today as part of a comprehensive evaluation and provision of chronic care management and care coordination services.     SDOH (Social Determinants of Health) assessments and interventions performed:    Advanced Directives Status: Not ready or willing to discuss.  CCM Care Plan  Allergies  Allergen Reactions   Ace Inhibitors Swelling and Other (See Comments)    Site of swelling  not known by the patient   Valsartan Swelling and Other (See Comments)    Site of swelling not known by the patient    Outpatient Encounter Medications as of 11/25/2020  Medication Sig   aspirin 81 MG chewable tablet Chew 1 tablet (81 mg total) by mouth daily.   atorvastatin (LIPITOR) 80 MG tablet Take 1 tablet (80 mg total) by mouth at bedtime.   empagliflozin (JARDIANCE) 10 MG TABS tablet Take 1 tablet (10 mg total) by mouth daily before breakfast.   furosemide (LASIX) 40 MG tablet Take 0.5 tablets (20 mg total) by mouth daily.   metoprolol succinate (TOPROL-XL) 25 MG 24 hr tablet Take 1 tablet (25 mg total) by mouth daily.   No facility-administered encounter medications on file as of 11/25/2020.    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)    Chronic HFrEF (heart failure with reduced ejection fraction) ([place])    Lives alone    High risk social situation    Episodic tension-type headache, not intractable 12/07/2019   Weight loss 02/06/2019   B12 deficiency 07/21/2017   Osteoarthritis 05/12/2017   Hyperlipidemia associated with type 2 diabetes mellitus ([place]) 12/20/2016   Dementia with behavioral disturbance ([place]) 07/06/2016   Insomnia 01/09/2015   Type 2 diabetes mellitus with  neurological complications ([place]) 95/97/4718   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   Hypertension associated with diabetes ([place]) 04/21/2007   Chronic urticaria 04/21/2007    Conditions to be addressed/monitored:  Memory Deficits  Care Plan : General Social Work (Adult)  Updates  made by Maurine Cane, LCSW since 11/25/2020 12:00 AM   Problem: Caregiver Stress    Goal: Caregiver Coping Optimized   Start Date: 10/07/2020  This Visit's Progress: Not on track  Recent Progress: On track  Priority: High  Current barriers:  Patient does not have HPOA   Level of care concerns, Medication procurement, and Memory Deficits Clinical Goals: Patient's daughter will work with LCSW to address needs related to caregiver resources for patient  Clinical Interventions:  Inter-disciplinary care team collaboration (see longitudinal plan of care) Assessment of needs, progress with care plan and barriers to care.  Daughter has complete packet for petition for guardianship but has not moved forward to file She has picked up the letter to support petition for guardianship that was left at front desk Review various resources, discussed options  Transportation provided by insurance provider, Enhanced Benefits connected with insurance provider, Dementia resources and support . Also reviewed progress with HPOA and guardianship.   Other interventions provided:Active listening; Problem Solving [facility] , Caregiver stress acknowledged , Patient Goals/Self-Care Activities: Over the next 30 days Call insurance provider to discuss transportation options and Enhanced Benefits  Call [place] to follow up on caregiver resources and support Complete Guardianship papers;      [name], [place] / [place]   [phone] 9:07 AM

## 2020-12-16 ENCOUNTER — Telehealth: Payer: Medicare PPO

## 2020-12-23 ENCOUNTER — Telehealth: Payer: Self-pay | Admitting: *Deleted

## 2020-12-23 NOTE — Chronic Care Management (AMB) (Signed)
  Care Management   Note  12/23/2020 Name: [name] MRN: [id number] DOB: May 20, 1941  [name] is a 80 y.o. year old male who is a primary care patient of [name], DO and is actively engaged with the care management team. I reached out to [name] by phone today to assist with re-scheduling a follow up visit with the RN Case Manager  Follow up plan: Unsuccessful telephone outreach attempt made. A HIPAA compliant phone message was left for the patient providing contact information and requesting a return call.  The care management team will reach out to the patient again over the next 7 days.  If patient returns call to provider office, please advise to call [place] at [phone].  [place] Management

## 2020-12-24 ENCOUNTER — Ambulatory Visit: Payer: Medicare PPO

## 2020-12-24 NOTE — Patient Instructions (Signed)
Visit Information  Mr. Hanf  it was nice speaking with you. Please call me directly [phone] if you have questions about the goals we discussed.   Goals Addressed             This Visit's Progress    Track and Manage Symptoms-Heart Failure       Timeframe:  Long-Range Goal Priority:  High Start Date:   10/10/20                       Expected End Date:  03/06/21              Follow Up Date 02/02/21   - develop a rescue plan - eat more whole grains, fruits and vegetables, lean meats and healthy fats - know when to call the doctor    Why is this important?   You will be able to handle your symptoms better if you keep track of them.  Making some simple changes to your lifestyle will help.  Eating healthy is one thing you can do to take good care of yourself.    Notes:         The patient verbalized understanding of instructions, educational materials, and care plan provided today and declined offer to receive copy of patient instructions, educational materials, and care plan.   Follow up Plan: Patient would like continued follow-up.  CCM RNCM will outreach the patient within the next 30 days  Patient will call office if needed prior to next encounter  [name], RN  [phone]

## 2020-12-24 NOTE — Chronic Care Management (AMB) (Signed)
  Care Management   Note  12/24/2020 Name: Siddiq Kaluzny MRN: [id number] DOB: 09/29/1940  Abran Gavigan is a 80 y.o. year old male who is a primary care patient of [name], DO and is actively engaged with the care management team. I reached out to [name] by phone today to assist with re-scheduling a follow up visit with the RN Case Manager  Follow up plan: Telephone appointment with care management team member scheduled for:12/24/20  [place] Management

## 2020-12-24 NOTE — Chronic Care Management (AMB) (Signed)
Care Management    RN Visit Note  12/24/2020 Name: [name] MRN: [id number] DOB: [birth year]  Subjective: [name] is a 80 y.o. year old male who is a primary care patient of [name] Gilding, DO. The care management team was consulted for assistance with disease management and care coordination needs.    Engaged with patient by telephone for follow up visit in response to provider referral for case management and/or care coordination services.   Consent to Services:   Mr. [name] was given information about Care Management services today including:  Care Management services includes personalized support from designated clinical staff supervised by his physician, including individualized plan of care and coordination with other care providers 24/7 contact phone numbers for assistance for urgent and routine care needs. The patient may stop case management services at any time by phone call to the office staff.  Patient agreed to services and consent obtained.   Assessment: Review of patient past medical history, allergies, medications, health status, including review of consultants reports, laboratory and other test data, was performed as part of comprehensive evaluation and provision of chronic care management services.   SDOH (Social Determinants of Health) assessments and interventions performed:    Care Plan  Allergies  Allergen Reactions   Ace Inhibitors Swelling and Other (See Comments)    Site of swelling not known by the patient   Valsartan Swelling and Other (See Comments)    Site of swelling not known by the patient    Outpatient Encounter Medications as of 12/24/2020  Medication Sig   aspirin 81 MG chewable tablet Chew 1 tablet (81 mg total) by mouth daily.   atorvastatin (LIPITOR) 80 MG tablet Take 1 tablet (80 mg total) by mouth at bedtime.   empagliflozin (JARDIANCE) 10 MG TABS tablet Take 1 tablet (10 mg total) by mouth daily before breakfast.   furosemide  (LASIX) 40 MG tablet Take 0.5 tablets (20 mg total) by mouth daily.   metoprolol succinate (TOPROL-XL) 25 MG 24 hr tablet Take 1 tablet (25 mg total) by mouth daily.   No facility-administered encounter medications on file as of 12/24/2020.    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   Hypertension associated with diabetes ([place]) 04/21/2007   Chronic urticaria 04/21/2007    Conditions to be addressed/monitored: CHF  Care Plan : RN Case Manager  Updates made by [name], RN since 12/24/2020 12:00 AM     Problem: (Heart Failure)   Priority: High  Onset Date: 10/10/2020     Long-Range Goal: Caregiver patient will learn signs and  symptoms of HF through monitoring for early detecttion to Prevented or Minimize Exacerbation   Start Date: 10/10/2020  Expected End Date: 03/06/2021  Priority: High  Note:   Current Barriers:  Knowledge deficit related to basic heart failure pathophysiology and self care management Literacy Barriers- Daughter  does not know a lot of information about  Heart Failure.  Case Manager Clinical Goal(s):  Caregiver/patient will verbalize understanding of Heart Failure Action Plan and when to call doctor  Interventions:  Collaboration with [name] Osmond, DO regarding development and update of comprehensive plan of care as evidenced by provider attestation and co-signature Inter-disciplinary care team collaboration (see longitudinal plan of care) Basic overview and discussion of pathophysiology of Heart Failure reviewed  Provided verbal education on low sodium diet Advised caregiver/patient to weigh each morning after emptying bladder Discussed importance of daily weight and advised patient to weigh and record daily- daughter stated she is going to see about getting her father a scale Reviewed role of diuretics in prevention of fluid overload and management of heart failure Perform or review cognitive and/or health literacy screening with caregiver. Sent daughter [name] educational material about heart failure-resending information she states she did not get the information Reviewed medications with caregiver/patient and discussed importance of medication adherence-Daughter has received a pill dispenser for her father and she states that it works great.  She can fill it for 28 days and keep the rest of his medications with her. She states that his doctors have stated that he is doing well and he does not have to see the heart doctor until next year.  Discussed plans with patient for ongoing care management follow up and provided patient with direct contact information for care management team Review of patient status, including review of consultants reports, relevant laboratory and other test results, and medications completed.  Caregiver-Patient/Self-Care Activities: Caregiver/Patient verbalizes understanding of plan to monitor signs an symptoms of Heart Failure Self administers medications as  prescribed Attends all scheduled provider appointments Calls pharmacy for medication refills Calls provider office for new concerns or questions  Caregiver/Patient Goals: - follow rescue plan if symptoms flare-up - know when to call the doctor - call office if I gain more than 2 pounds in one day or 5 pounds in one week - use salt in moderation - watch for swelling in feet, ankles and legs every day     Plan: Telephone follow up appointment with care management team member scheduled for:  02/02/21  [name] RN, BSN, [place] Management Coordinator [place] Phone: [phone] I Fax: [phone]

## 2021-01-13 ENCOUNTER — Telehealth: Payer: Self-pay

## 2021-01-13 NOTE — Telephone Encounter (Signed)
LVM to have pt call back to schedule AWV.   RE: confirm insurance and schedule AWV on my schedule if times are convenient for patient or other AWV schedule as template permits.   

## 2021-01-26 ENCOUNTER — Telehealth: Payer: Self-pay

## 2021-01-26 NOTE — Telephone Encounter (Signed)
Placed call to patient to schedule Annual wellness visit. LVM

## 2021-02-02 ENCOUNTER — Ambulatory Visit: Payer: Medicare PPO

## 2021-02-02 NOTE — Patient Instructions (Signed)
Visit Information  [name]  it was nice speaking with you. Please call me directly [phone] if you have questions about the goals we discussed.   Goals Addressed             This Visit's Progress    Track and Manage Symptoms-Heart Failure       Timeframe:  Long-Range Goal Priority:  High Start Date:   10/10/20                       Expected End Date:  04/06/21          Follow Up Date 03/02/21   - develop a rescue plan - eat more whole grains, fruits and vegetables, lean meats and healthy fats - know when to call the doctor    Why is this important?   You will be able to handle your symptoms better if you keep track of them.  Making some simple changes to your lifestyle will help.  Eating healthy is one thing you can do to take good care of yourself.    Notes:        The patient verbalizes understanding of the information and instructions discussed today.  Our next appointment is scheduled for  03/02/21.   Please feel free to call me or the office if you have any questions or concerns.  [name] RN, BSN, [facility] Care Management Coordinator [place] Phone: [phone] I Fax: [phone]

## 2021-02-02 NOTE — Chronic Care Management (AMB) (Signed)
Chronic Care Management   CCM RN Visit Note  02/02/2021 Name: [name] MRN: [id number] DOB: 03-Jul-1940  Subjective: [name] is a 80 y.o. year old male who is a primary care patient of [name], DO. The care management team was consulted for assistance with disease management and care coordination needs.    Engaged with patient by telephone for follow up visit in response to provider referral for case management and/or care coordination services.   Consent to Services:  The patient was given information about Chronic Care Management services, agreed to services, and gave verbal consent prior to initiation of services.  Please see initial visit note for detailed documentation.   Patient agreed to services and verbal consent obtained.    Assessment: Patient's daughter provided all information during this encounter. Patient is currently experiencing difficulty with decline with his memory.. See Care Plan below for interventions and patient self-care actives. Follow up Plan: Patient would like continued follow-up.  CCM RNCM will outreach the patient within the next 4 weeks  Patient will call office if needed prior to next encounter Review of patient past medical history, allergies, medications, health status, including review of consultants reports, laboratory and other test data, was performed as part of comprehensive evaluation and provision of chronic care management services.   SDOH (Social Determinants of Health) assessments and interventions performed:  No  CCM Care Plan  Allergies  Allergen Reactions   Ace Inhibitors Swelling and Other (See Comments)    Site of swelling not known by the patient   Valsartan Swelling and Other (See Comments)    Site of swelling not known by the patient    Outpatient Encounter Medications as of 02/02/2021  Medication Sig   aspirin 81 MG chewable tablet Chew 1 tablet (81 mg total) by mouth daily.   atorvastatin (LIPITOR) 80 MG  tablet Take 1 tablet (80 mg total) by mouth at bedtime.   empagliflozin (JARDIANCE) 10 MG TABS tablet Take 1 tablet (10 mg total) by mouth daily before breakfast.   furosemide (LASIX) 40 MG tablet Take 0.5 tablets (20 mg total) by mouth daily.   metoprolol succinate (TOPROL-XL) 25 MG 24 hr tablet Take 1 tablet (25 mg total) by mouth daily.   No facility-administered encounter medications on file as of 02/02/2021.    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 ([place])    Chronic HFrEF (heart failure with reduced ejection fraction) ([place])    Lives alone    High risk social situation    Episodic tension-type headache, not intractable 12/07/2019   Weight loss 02/06/2019   B12 deficiency 07/21/2017   Osteoarthritis 05/12/2017   Hyperlipidemia associated with type 2 diabetes mellitus ([place]) 12/20/2016   Dementia with behavioral disturbance ([place]) 07/06/2016   Insomnia 01/09/2015   Type 2 diabetes mellitus with neurological complications ([place]) [id number]   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   Hypertension associated with diabetes ([place]) 04/21/2007   Chronic urticaria 04/21/2007    Conditions to be addressed/monitored:CHF  Care Plan : RN Case Manager  Updates made by [name], RN since 02/02/2021 12:00 AM  Problem: (Heart Failure)   Priority: High  Onset Date: 10/10/2020     Long-Range Goal: Caregiver patient will learn signs and symptoms of HF through monitoring for early detecttion to Prevented or Minimize Exacerbation   Start Date: 10/10/2020  Expected End Date:  04/06/2021  Priority: High  Note:   Current Barriers:  Knowledge deficit related to basic heart failure pathophysiology and self care management Literacy Barriers- Daughter does not know a lot of information about  Heart Failure.  Case Manager Clinical Goal(s):  Caregiver/patient will verbalize understanding of Heart Failure Action Plan and when to call doctor  Interventions:  Collaboration with Richarda Osmond, DO regarding development and update of comprehensive plan of care as evidenced by provider attestation and co-signature Inter-disciplinary care team collaboration (see longitudinal plan of care) Basic overview and discussion of pathophysiology of Heart Failure reviewed  Provided verbal education on low sodium diet Advised caregiver/patient to weigh each morning after emptying bladder Discussed importance of daily weight and advised patient to weigh and record daily-  Perform or review cognitive and/or health literacy screening with caregiver. Reviewed medications with caregiver/patient and discussed importance of medication adherence-Daughter has received a pill dispenser for her father and she states that it works great.  She can fill it for 28 days and keep the rest of his medications with her. Discussed plans with patient for ongoing care management follow up and provided patient with direct contact information for care management team Review of patient status, including review of consultants reports, relevant laboratory and other test results, and medications completed. 02/02/21: Spoke with the patient's daughter and she is having some caregiver stress and needed some time to vent.  She feels that her father has started to decline in his memory.  He is calling her at all times during the day and night.  To go to the grocery store.  When she has already taken him.  When he needs to pay bills he will want to go back across town when there is the same branch close by.  She will ask  her brother to help with some of his demands and he does not want him to help only his daughter.  She is trying to work a job, take care of her family, and him. CCM RN will send information to LCSW for caregiver stress 02/02/21: She states that he is smoking more, not taking his medications on time and she has gone to his home and he will be walking down the street.  She wants to see about getting him a Physiological scientist.  CCM RN asked if he had a Medicaid card [place] states that she had been told by a Insurance account manager the he did and they would send the card but he never received it.  I asked her if one day we could call all three of [name] to get it sent and at least get the number while on the phone to try and get the process started for him if he has one. [place] will call me and let me know what day we can set up the call. CCM RN also spoke with office staff to see if there was a way that we could pull the Medicaid number and we were unable.  Caregiver-Patient/Self-Care Activities: Caregiver/Patient verbalizes understanding of plan to monitor signs an symptoms of Heart Failure Self administers medications as prescribed Attends all scheduled provider appointments Calls pharmacy for medication refills Calls provider office for new concerns or questions  Caregiver/Patient Goals: -  follow rescue plan if symptoms flare-up - know when to call the doctor - call office if I gain more than 2 pounds in one day or 5 pounds in one week - use salt in moderation - watch for swelling in feet, ankles and legs every day     [name] RN, BSN, [place] Phone: [phone] I Fax: [phone]

## 2021-02-10 NOTE — Telephone Encounter (Signed)
AWV scheduled 

## 2021-02-12 ENCOUNTER — Ambulatory Visit: Payer: Medicare PPO | Admitting: Licensed Clinical Social Worker

## 2021-02-12 ENCOUNTER — Ambulatory Visit (INDEPENDENT_AMBULATORY_CARE_PROVIDER_SITE_OTHER): Payer: Medicare PPO

## 2021-02-12 DIAGNOSIS — Z0000 Encounter for general adult medical examination without abnormal findings: Secondary | ICD-10-CM

## 2021-02-12 DIAGNOSIS — Z7189 Other specified counseling: Secondary | ICD-10-CM

## 2021-02-12 DIAGNOSIS — Z719 Counseling, unspecified: Secondary | ICD-10-CM

## 2021-02-12 NOTE — Progress Notes (Signed)
Subjective:   [name] is a 80 y.o. male who presents for Medicare Annual/Subsequent preventive examination.  I connected with  [name] on 02/12/21 by an audio only telemedicine application and verified that I am speaking with the correct person using two identifiers.   I discussed the limitations, risks, security and privacy concerns of performing an evaluation and management service by telephone and the availability of in person appointments. I also discussed with the patient that there may be a patient responsible charge related to this service. The patient expressed understanding and verbally consented to this telephonic visit.  Location of Patient: Home Location of Provider:Office  List any persons and their role that are participating in the visit with the patient: [name] (patient), [name] (patient daughter) and Aviva Signs, [place]   Review of Systems     Defer to PCP        Objective:    Today's Vitals   02/12/21 1632  PainSc: 2    There is no height or weight on file to calculate BMI.  Advanced Directives 02/12/2021 10/09/2020 08/24/2020 08/11/2020 07/31/2020 12/07/2019 02/13/2019  Does Patient Have a Medical Advance Directive? No No No No No No No  Would patient like information on creating a medical advance directive? No - Patient declined No - Patient declined No - Patient declined No - Patient declined No - Patient declined Yes (ED - Information included in AVS) No - Patient declined    Current Medications (verified) Outpatient Encounter Medications as of 02/12/2021  Medication Sig   aspirin 81 MG chewable tablet Chew 1 tablet (81 mg total) by mouth daily.   atorvastatin (LIPITOR) 80 MG tablet Take 1 tablet (80 mg total) by mouth at bedtime.   empagliflozin (JARDIANCE) 10 MG TABS tablet Take 1 tablet (10 mg total) by mouth daily before breakfast.   furosemide (LASIX) 40 MG tablet Take 0.5 tablets (20 mg total) by mouth daily.    metoprolol succinate (TOPROL-XL) 25 MG 24 hr tablet Take 1 tablet (25 mg total) by mouth daily.   No facility-administered encounter medications on file as of 02/12/2021.    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   Urticaria    Past Surgical History:  Procedure Laterality Date   ADENOIDECTOMY     CHOLECYSTECTOMY  1960   COLON SURGERY     right hemi-colectomy   COLONOSCOPY  multiple   ESOPHAGOGASTRODUODENOSCOPY     TONSILLECTOMY     Family History  Problem Relation Age of Onset   Diabetes Other  nephew   Colon cancer Neg Hx    Social History   Socioeconomic History   Marital status: Divorced    Spouse name: Not on file   Number of children: 3   Years of education: 12   Highest education level: High school graduate  Occupational History   Occupation: retired    Comment: Administrator   Tobacco Use   Smoking status: Every Day    Packs/day: 1.00    Years: 30.00    Pack years: 30.00    Types: Cigarettes   Smokeless tobacco: Current    Types: Chew    Last attempt to quit: 06/07/2001  Vaping Use   Vaping Use: Never used  Substance and Sexual Activity   Alcohol use: No    Alcohol/week: 0.0  standard drinks    Comment: quit about 10 years ago    Drug use: No   Sexual activity: Not Currently    Partners: Female  Other Topics Concern   Not on file  Social History Narrative   Lives in [place] alone   Hobbies: hangs out socially    Patient enjoys walking around the community to stay active.      Right handed   Caffeine: none       Social Information ( copy and paste under history) updated 10/07/20   patient lives alone  . Retired Sports coach . Primary family support persons is daughter, [name]    Difficulty affording medication: No    Strengths:Supportive family/friends      Family Members: Recruitment consultant (Dgt), Assunta Found (Son), Vevelyn Royals (Sister)      Basic Activities of Daily Living    Dressing:Self-care   Eating: Partial assistance receives Mom's Meals; able to cook basic things   Ambulation: Partial assistance uses a cane   Toileting: Partial assistance uses depends   Bathing: Self-care; daughter unsure or how well he is doing in this area       Instrumental Activities of Daily Living   Shopping: Partial assistance; daughter helps   House/Yard Work: Partial assistance; daughter helps   Administration of medications: Total assistance; Daughter sets up medication; calls or comes by to make sure he takes it   Finances: Partial assistance; will not allow daughter to help she is unsure how he is paying everything   Telephone: Partial assistance; has land line phone but usually doesn't answer it   Transportation: Total assistance; Daughter takes to all appointments       Caregivers in home: No   Social Determinants of Health   Financial Resource Strain: Low Risk    Difficulty of Paying Living Expenses: Not hard at all  Food Insecurity: No Food Insecurity   Worried About Charity fundraiser in the Last Year: Never true   [place] in the Last Year: Never true  Transportation Needs: No Transportation Needs   Lack of Transportation (Medical):  No   Lack of Transportation (Non-Medical): No  Physical Activity: Inactive   Days of Exercise per Week: 0 days   Minutes of Exercise per Session: 0 min  Stress: No Stress Concern Present   Feeling of Stress : Not at all  Social Connections: Socially Isolated   Frequency of Communication with Friends and Family: Once a week   Frequency of Social Gatherings with Friends and Family: Never   Attends Religious Services: Never   Marine scientist or Organizations: No   Attends Archivist Meetings: Never   Marital  Status: Divorced    Tobacco Counseling Ready to quit: No Counseling given: No   Clinical Intake:  Pre-visit preparation completed: Yes  Pain : 0-10 Pain Score: 2  Pain Type: Neuropathic pain Pain Location: Calf Pain Orientation: Right Pain Radiating Towards: Pain does radiate to the toes Pain Descriptors / Indicators: Numbness, Tingling Pain Onset: More than a month ago Pain Frequency: Intermittent Pain Relieving Factors: Nothing makes it feel better. Effect of Pain on Daily Activities: Patient states that it does not affect ADL's  Pain Relieving Factors: Nothing makes it feel better.  Nutritional Risks: Unintentional weight loss Diabetes: Yes CBG done?: No Did pt. bring in CBG monitor from home?: No  How often do you need to have someone help you when you read instructions, pamphlets, or other written materials from your doctor or pharmacy?: 1 - Never What is the last grade level you completed in school?: 12th grade  Diabetic? Yes  Interpreter Needed?: No  Information entered by :: Aviva Signs, [place]   Activities of Daily Living In your present state of health, do you have any difficulty performing the following activities: 02/12/2021 10/10/2020  Hearing? N Y  Vision? N Y  Comment - Reading  Difficulty concentrating or making decisions? N Y  Comment - patient has dementia  Walking or climbing stairs? N Y  Comment - daughter  states he uses a cane [name] slow  Dressing or bathing? N N  Doing errands, shopping? Y Y  Comment - his demetia he will get lost  Preparing Food and eating ? N Y  Using the Toilet? - N  In the past six months, have you accidently leaked urine? - N  Do you have problems with loss of bowel control? - N  Managing your Medications? - Y  Comment - Daughter fixes med hemay take too much  Managing your Finances? - Y  Comment - his Company secretary or managing your Housekeeping? - Y  Comment - his demetia  Some recent data might be hidden    Patient Care Team: [name], DO as PCP - General (Family Medicine) Jerline Pain, MD as PCP - Cardiology (Cardiology) Kennith Gain, MD as Consulting Physician (Allergy) [name], RN as Case Manager Maurine Cane, LCSW as Social Worker (Licensed Clinical Social Worker)  Indicate any recent [place] you may have received from other than [facility] providers in the past year (date may be approximate).     Assessment:   This is a routine wellness examination for [facility].  Hearing/Vision screen No results found.  Dietary issues and exercise activities discussed:     Goals Addressed   None   Depression Screen PHQ 2/9 Scores 02/12/2021 10/16/2020 08/11/2020 11/28/2019 07/18/2019 02/13/2019 12/05/2018  PHQ - 2 Score 0 3 0 0 0 0 0  PHQ- 9 Score - 10 - - - - -    Fall Risk Fall Risk  02/12/2021 10/10/2020 10/09/2020 08/11/2020 07/18/2019  Falls in the past year? 0 0 0 0 0  Number falls in past yr: 0 - 0 0 0  Injury with Fall? 0 - - 0 -  Risk for fall due to : Impaired mobility;Impaired balance/gait - - - -  Follow up Falls evaluation completed - - - Falls evaluation completed    FALL RISK PREVENTION PERTAINING TO THE HOME:  Any stairs in or around the home?  N/A If so, are there any without handrails?  N/A Home free of loose throw rugs in  walkways, pet beds, electrical cords, etc? No  Adequate lighting in your home to reduce  risk of falls? Yes   ASSISTIVE DEVICES UTILIZED TO PREVENT FALLS:  Life alert? Yes  Use of a cane, walker or w/c? Yes  Grab bars in the bathroom? Yes  Shower chair or bench in shower? No  Elevated toilet seat or a handicapped toilet? No     Cognitive Function:     6CIT Screen 02/12/2021 12/05/2018  What Year? 4 points 0 points  What month? 0 points 0 points  What time? 0 points 0 points  Count back from 20 0 points 0 points  Months in reverse 0 points 0 points  Repeat phrase 10 points 0 points  Total Score 14 0    Immunizations Immunization History  Administered Date(s) Administered   Fluad Quad(high Dose 65+) 07/18/2019   Influenza, High Dose Seasonal PF 03/23/2018   Influenza,inj,Quad PF,6+ Mos 04/13/2016, 04/13/2017   Pneumococcal Conjugate-13 11/07/2017   Pneumococcal Polysaccharide-23 07/18/2019   Tdap 07/15/2017    TDAP status: Up to date  Flu Vaccine status: Due, Education has been provided regarding the importance of this vaccine. Advised may receive this vaccine at local pharmacy or Health Dept. Aware to provide a copy of the vaccination record if obtained from local pharmacy or Health Dept. Verbalized acceptance and understanding.  Pneumococcal vaccine status: Up to date  Covid-19 vaccine status: Information provided on how to obtain vaccines.   Qualifies for Shingles Vaccine? Yes   Zostavax completed No   Shingrix Completed?: No.    Education has been provided regarding the importance of this vaccine. Patient has been advised to call insurance company to determine out of pocket expense if they have not yet received this vaccine. Advised may also receive vaccine at local pharmacy or Health Dept. Verbalized acceptance and understanding.  Screening Tests Health Maintenance  Topic Date Due   COVID-19 Vaccine (1) Never done   Zoster Vaccines- Shingrix (1 of 2) Never done   FOOT EXAM  11/08/2018   OPHTHALMOLOGY EXAM  11/10/2018   URINE MICROALBUMIN  06/23/2019    INFLUENZA VACCINE  01/05/2021   HEMOGLOBIN A1C  02/11/2021   TETANUS/TDAP  07/16/2027   PNA vac Low Risk Adult  Completed   HPV VACCINES  Aged Out    Health Maintenance  Health Maintenance Due  Topic Date Due   COVID-19 Vaccine (1) Never done   Zoster Vaccines- Shingrix (1 of 2) Never done   FOOT EXAM  11/08/2018   OPHTHALMOLOGY EXAM  11/10/2018   URINE MICROALBUMIN  06/23/2019   INFLUENZA VACCINE  01/05/2021   HEMOGLOBIN A1C  02/11/2021    Colorectal cancer screening: No longer required.   Lung Cancer Screening: (Low Dose CT Chest recommended if Age 57-80 years, 30 pack-year currently smoking OR have quit w/in 15years.) does qualify.   Lung Cancer Screening Referral: No  Additional Screening:  Hepatitis C Screening: does not qualify; Completed HM states that patient has aged out.   Vision Screening: Recommended annual ophthalmology exams for early detection of glaucoma and other disorders of the eye. Is the patient up to date with their annual eye exam?  No  Who is the provider or what is the name of the office in which the patient attends annual eye exams? Not currently If pt is not established with a provider, would they like to be referred to a provider to establish care? No .   Dental Screening: Recommended annual dental exams for proper oral  hygiene  Community Resource Referral / Chronic Care Management: CRR required this visit?  No   CCM required this visit?  No      Plan:     I have personally reviewed and noted the following in the patient's chart:   Medical and social history Use of alcohol, tobacco or illicit drugs  Current medications and supplements including opioid prescriptions. Patient is not currently taking opioid prescriptions. Functional ability and status Nutritional status Physical activity Advanced directives List of other physicians Hospitalizations, surgeries, and ER visits in previous 12 months Vitals Screenings to include  cognitive, depression, and falls Referrals and appointments  In addition, I have reviewed and discussed with patient certain preventive protocols, quality metrics, and best practice recommendations. A written personalized care plan for preventive services as well as general preventive health recommendations were provided to patient.     Tawni Pummel, [place]   02/12/2021   Nurse Notes: Patient stated that he [name] want to feel better in regards to his right leg and foot.     Mr. Burrage , Thank you for taking time to come for your Medicare Wellness Visit. I appreciate your ongoing commitment to your health goals. Please review the following plan we discussed and let me know if I can assist you in the future.   These are the goals we discussed:  Goals      Caregiver Coping Optimized     Patient Goals/Self-Care Activities:  Call insurance provider to discuss transportation options and Enhanced Benefits  Call [place] to follow up on caregiver resources and support Complete Guardianship papers;  Call DSS to discuss support options for patient     Track and Manage Symptoms-Heart Failure     Timeframe:  Long-Range Goal Priority:  High Start Date:   10/10/20                       Expected End Date:  04/06/21          Follow Up Date 03/02/21   - develop a rescue plan - eat more whole grains, fruits and vegetables, lean meats and healthy fats - know when to call the doctor    Why is this important?   You will be able to handle your symptoms better if you keep track of them.  Making some simple changes to your lifestyle will help.  Eating healthy is one thing you can do to take good care of yourself.    Notes:         This is a list of the screening recommended for you and due dates:  Health Maintenance  Topic Date Due   COVID-19 Vaccine (1) Never done   Zoster (Shingles) Vaccine (1 of 2) Never done   Complete foot exam   11/08/2018   Eye exam for diabetics  11/10/2018    Urine Protein Check  06/23/2019   Flu Shot  01/05/2021   Hemoglobin A1C  02/11/2021   Tetanus Vaccine  07/16/2027   Pneumonia vaccines  Completed   HPV Vaccine  Aged Out

## 2021-02-12 NOTE — Patient Instructions (Signed)
Health Maintenance, Male Adopting a healthy lifestyle and getting preventive care are important in promoting health and wellness. Ask your health care provider about: The right schedule for you to have regular tests and exams. Things you can do on your own to prevent diseases and keep yourself healthy. What should I know about diet, weight, and exercise? Eat a healthy diet  Eat a diet that includes plenty of vegetables, fruits, low-fat dairy products, and lean protein. Do not eat a lot of foods that are high in solid fats, added sugars, or sodium. Maintain a healthy weight Body mass index (BMI) is a measurement that can be used to identify possible weight problems. It estimates body fat based on height and weight. Your health care provider can help determine your BMI and help you achieve or maintain a healthy weight. Get regular exercise Get regular exercise. This is one of the most important things you can do for your health. Most adults should: Exercise for at least 150 minutes each week. The exercise should increase your heart rate and make you sweat (moderate-intensity exercise). Do strengthening exercises at least twice a week. This is in addition to the moderate-intensity exercise. Spend less time sitting. Even light physical activity can be beneficial. Watch cholesterol and blood lipids Have your blood tested for lipids and cholesterol at 81 years of age, then have this test every 5 years. You may need to have your cholesterol levels checked more often if: Your lipid or cholesterol levels are high. You are older than 81 years of age. You are at high risk for heart disease. What should I know about cancer screening? Many types of cancers can be detected early and may often be prevented. Depending on your health history and family history, you may need to have cancer screening at various ages. This may include screening for: Colorectal cancer. Prostate cancer. Skin cancer. Lung  cancer. What should I know about heart disease, diabetes, and high blood pressure? Blood pressure and heart disease High blood pressure causes heart disease and increases the risk of stroke. This is more likely to develop in people who have high blood pressure readings, are of African descent, or are overweight. Talk with your health care provider about your target blood pressure readings. Have your blood pressure checked: Every 3-5 years if you are 18-39 years of age. Every year if you are 40 years old or older. If you are between the ages of 65 and 75 and are a current or former smoker, ask your health care provider if you should have a one-time screening for abdominal aortic aneurysm (AAA). Diabetes Have regular diabetes screenings. This checks your fasting blood sugar level. Have the screening done: Once every three years after age 45 if you are at a normal weight and have a low risk for diabetes. More often and at a younger age if you are overweight or have a high risk for diabetes. What should I know about preventing infection? Hepatitis B If you have a higher risk for hepatitis B, you should be screened for this virus. Talk with your health care provider to find out if you are at risk for hepatitis B infection. Hepatitis C Blood testing is recommended for: Everyone born from 1945 through 1965. Anyone with known risk factors for hepatitis C. Sexually transmitted infections (STIs) You should be screened each year for STIs, including gonorrhea and chlamydia, if: You are sexually active and are younger than 81 years of age. You are older than 81 years   of age and your health care provider tells you that you are at risk for this type of infection. Your sexual activity has changed since you were last screened, and you are at increased risk for chlamydia or gonorrhea. Ask your health care provider if you are at risk. Ask your health care provider about whether you are at high risk for HIV.  Your health care provider may recommend a prescription medicine to help prevent HIV infection. If you choose to take medicine to prevent HIV, you should first get tested for HIV. You should then be tested every 3 months for as long as you are taking the medicine. Follow these instructions at home: Lifestyle Do not use any products that contain nicotine or tobacco, such as cigarettes, e-cigarettes, and chewing tobacco. If you need help quitting, ask your health care provider. Do not use street drugs. Do not share needles. Ask your health care provider for help if you need support or information about quitting drugs. Alcohol use Do not drink alcohol if your health care provider tells you not to drink. If you drink alcohol: Limit how much you have to 0-2 drinks a day. Be aware of how much alcohol is in your drink. In the [place] one drink equals one 12 oz bottle of beer (355 mL), one 5 oz glass of wine (148 mL), or one 1 oz glass of hard liquor (44 mL). General instructions Schedule regular health, dental, and eye exams. Stay current with your vaccines. Tell your health care provider if: You often feel depressed. You have ever been abused or do not feel safe at home. Summary Adopting a healthy lifestyle and getting preventive care are important in promoting health and wellness. Follow your health care provider's instructions about healthy diet, exercising, and getting tested or screened for diseases. Follow your health care provider's instructions on monitoring your cholesterol and blood pressure. This information is not intended to replace advice given to you by your health care provider. Make sure you discuss any questions you have with your health care provider. Document Revised: 08/01/2020 Document Reviewed: 05/17/2018 Elsevier Patient Education  2022 Elsevier Inc.  

## 2021-02-12 NOTE — Patient Instructions (Signed)
Visit Information   Goals Addressed             This Visit's Progress    Caregiver Coping Optimized   On track    Patient Goals/Self-Care Activities:  Call insurance provider to discuss transportation options and Enhanced Benefits  Call [place] to follow up on caregiver resources and support Complete Guardianship papers;  Call DSS to discuss support options for patient       It was a pleasure speaking with you today. Patient's Daughter verbalizes understanding of instructions provided today.  Per our conversation I will remain part of your care team for the next 60 days.  If no needs are identified in the next 60 days, I will disconnect from the care team.  [name], [facility]  [phone]

## 2021-02-12 NOTE — Chronic Care Management (AMB) (Signed)
Care Management Clinical Social Work Note  02/12/2021 Name: [name] MRN: [id number] DOB: 1941-02-18  [name] is a 80 y.o. year old male who is a primary care patient of [name], DO.  The Care Management team was consulted for assistance with coordination needs.  Consent to Services:  The patient was given information about Care Management services, agreed to services, and gave verbal consent prior to initiation of services.  Please see initial visit note for detailed documentation.   Patient agreed to services today and consent obtained.   Assessment:  Patient's daughter provided all information during this encounter.Discussed barriers to care and concerns.  See Care Plan below for interventions and patient self-care actives.  Recent life changes or stressors: daughter is unable to make progress with assisting patient with meeting his needs. No POA and unable to move forward with guardianship. Possible concerns with safety due to cognitive decline as patient lives alone.   Recommendation: Patient may benefit from, and daughter is in agreement contact DSS for consultation of options .   Follow up Plan:  Patient's daughter does not require or desire continued follow-up by CCM LCSW.   CCM RN will continue to follow for ongoing support. Will contact the office if needed CCM LCSW will continue to collaborate with DSS as needed in order to meet patient's needs .   Review of patient past medical history, allergies, medications, and health status, including review of relevant consultants reports was performed today as part of a comprehensive evaluation and provision of chronic care management and care coordination services.  SDOH (Social Determinants of Health) assessments and interventions performed:    Advanced Directives Status: Not ready or willing to discuss.  Care Plan  Allergies  Allergen Reactions   Ace Inhibitors Swelling and Other (See Comments)    Site of  swelling not known by the patient   Valsartan Swelling and Other (See Comments)    Site of swelling not known by the patient    Outpatient Encounter Medications as of 02/12/2021  Medication Sig   aspirin 81 MG chewable tablet Chew 1 tablet (81 mg total) by mouth daily.   atorvastatin (LIPITOR) 80 MG tablet Take 1 tablet (80 mg total) by mouth at bedtime.   empagliflozin (JARDIANCE) 10 MG TABS tablet Take 1 tablet (10 mg total) by mouth daily before breakfast.   furosemide (LASIX) 40 MG tablet Take 0.5 tablets (20 mg total) by mouth daily.   metoprolol succinate (TOPROL-XL) 25 MG 24 hr tablet Take 1 tablet (25 mg total) by mouth daily.   No facility-administered encounter medications on file as of 02/12/2021.    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)    Chronic HFrEF (heart failure with reduced ejection fraction) ([place])    Lives alone    High risk social situation    Episodic tension-type headache, not intractable 12/07/2019   Weight loss 02/06/2019   B12 deficiency 07/21/2017   Osteoarthritis 05/12/2017   Hyperlipidemia associated with type 2 diabetes mellitus ([place]) 12/20/2016   Dementia with behavioral disturbance ([place]) 07/06/2016   Insomnia 01/09/2015   Type 2 diabetes mellitus with neurological complications ([place])  06/05/2014   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   Hypertension associated with diabetes ([place]) 04/21/2007   Chronic urticaria 04/21/2007    Conditions to be addressed/monitored: ; Level of care concerns  Care Plan : General Social Work  (Adult)  Updates made by Maurine Cane, LCSW since 02/12/2021 12:00 AM     Problem: Caregiver Stress      Goal: Caregiver Coping Optimized   Start Date: 10/07/2020  Recent Progress: Not on track  Priority: High  Note:   Current barriers:  Continue decline in health Patient does not have HPOA  Level of care concerns, Medication procurement, and Memory Deficits Clinical Goals: Patient's daughter will work with LCSW to address needs related to caregiver resources for patient  Clinical Interventions:  Inter-disciplinary care team collaboration (see longitudinal plan of care) Assessment of needs, progress with care plan and barriers to care.  Daughter has complete packet for petition for guardianship but has not moved forward to file ( assessed for barriers; unable to pay for petition) She has picked up the letter to support petition for guardianship that was left at front desk Discussed options for levels of care and barriers ( facility placement, home support) Consultation with DSS adult services worker Jewel Sington [phone] (concerns and consultation for options to support daughter)  Connected patient's daughter with Jewel to call for support options  Collaborated with CCM RN and CMA ref. [facility] for ongoing needs and support.(Unclear of safety concerns and capacity will need update form [facility] with recommendations.     Review various resources, discussed options  Transportation provided by insurance provider, Enhanced Benefits connected with insurance provider, Dementia resources and support . Also reviewed progress with HPOA and guardianship.   Other interventions provided:Active listening; Problem Solving [facility] , Caregiver stress acknowledged , Patient Goals/Self-Care Activities: Over the next 30 days Call insurance provider to discuss transportation options and Enhanced Benefits  Call [facility] to follow up on caregiver resources and support Complete Guardianship papers;   Call DSS to discuss support options for patient     [name], [place] / [place]   [phone] 11:23 AM

## 2021-02-17 ENCOUNTER — Ambulatory Visit: Payer: Self-pay | Admitting: Licensed Clinical Social Worker

## 2021-02-17 DIAGNOSIS — Z789 Other specified health status: Secondary | ICD-10-CM

## 2021-02-17 NOTE — Patient Instructions (Signed)
   Patient was not contacted during this encounter.  LCSW collaborated with care team to accomplish patient's care plan goal   Lyndal Alamillo, LCSW Care Management & Coordination  [phone]  

## 2021-02-17 NOTE — Chronic Care Management (AMB) (Signed)
Care Management  Collaboration  Note  02/17/2021 Name: [name] MRN: [id number] DOB: Sep 24, 1940  [name] is a 80 y.o. year old male who is a primary care patient of [name], DO. The CCM team was consulted reference care coordination needs for Level of Care Concerns.  Assessment: Patient was not interviewed or contacted during this encounter. See Care Plan or interventions for patient self-care actives.   Intervention:Conducted brief assessment, recommendations and relevant information discussed. CCM LCSW collaborated with CCM RN, [place] for [employer], and Adult Primary school teacher .   After annual wellness visit update indicated patient is mostly aware but at the same time there are memory deficits that are interfering with his ability to take care of himself. During the visit CMA noted He was very verbal in stating that he can take care of himself and became irritated by the end of the visit.   Per CMA [name] the daughter stated that she does have a court date to get guardianship of her dad. .   Follow up Plan:  No follow up scheduled with CCM LCSW at this time. Will follow up with patient's daughter as needed.  They will call office if needed prior to next encounter. CCM LCSW will continue to collaborate with CCM RN in order to meet patient's needs. F/u appointment with CCN RN in two weeks .   Review of patient past medical history, allergies, medications, and health status, including review of pertinent consultant reports was performed as part of comprehensive evaluation and provision of care management/care coordination services.   Care Plan Conditions to be addressed/monitored per PCP order: , Level of care concerns and Cognitive Deficits   Care Plan : General Social Work (Adult)  Updates made by Maurine Cane, LCSW since 02/17/2021 12:00 AM     Problem: Caregiver Stress      Goal: Caregiver Coping Optimized   Start Date: 10/07/2020  This Visit's Progress: On track   Recent Progress: Not on track  Priority: High  Note:   Current barriers:  Continue decline in health Patient does not have HPOA  Level of care concerns, Medication procurement, and Memory Deficits Clinical Goals: Patient's daughter will work with LCSW to address needs related to caregiver resources for patient  Clinical Interventions:  Inter-disciplinary care team collaboration (see longitudinal plan of care) Assessment of needs, progress with care plan and barriers to care.  Daughter has complete packet for petition for guardianship but has not moved forward to file ( assessed for barriers; unable to pay for petition) She has picked up the letter to support petition for guardianship that was left at front desk Discussed options for levels of care and barriers ( facility placement, home support) F/u call to adult services worker Jewel Sington [phone] left voice message to provide update (concerns and consultation for options to support daughter)  Connected patient's daughter with Jewel to call for support options  Collaborated with CCM RN and CMA ref. [facility] for ongoing needs and support.(Unclear of safety concerns and capacity Per CMA from [facility] daughter has appointment for court date to guardianship      Review various resources, discussed options  Transportation provided by insurance provider, Enhanced Benefits connected with insurance provider, Dementia resources and support . Also reviewed progress with HPOA and guardianship.   Other interventions provided:Active listening; Problem Solving /[facility] , Caregiver stress acknowledged , Patient Goals/Self-Care Activities: Over the next 30 days Call insurance provider to discuss transportation options and Enhanced Benefits  Call  [facility] to follow up on caregiver resources and support Complete Guardianship papers;  Call DSS to discuss support options for patient    [name], [place] / [place]   [phone] 11:48 AM

## 2021-02-19 ENCOUNTER — Ambulatory Visit: Payer: Self-pay | Admitting: Licensed Clinical Social Worker

## 2021-02-19 NOTE — Patient Instructions (Signed)
   Patient was not contacted during this encounter.  LCSW collaborated with care team to accomplish patient's care plan goal   Jeanelle Dake, LCSW Care Management & Coordination  [phone]  

## 2021-02-19 NOTE — Chronic Care Management (AMB) (Signed)
  Care Management  Collaboration  Note  02/19/2021 Name: [name] MRN: [id number] DOB: 1940/11/03  [name] is a 80 y.o. year old male who is a primary care patient of [name] Gilding, DO. The CCM team was consulted reference care coordination needs for Level of Care Concerns and Caregiver Stress.  Assessment: Patient was not interviewed or contacted during this encounter. See Care Plan or interventions for patient self-care actives.   Intervention:Conducted brief assessment, recommendations and relevant information discussed.  CCM LCSW collaborated with [name], DSS Adult Art therapist for consultation and support: She assisted daughter with removing barriers for guardianship for no cost. States no other needs identified at this time.     Follow up Plan: CCM LCSW will continue to collaborate with CCM RN  in order to meet patient's needs .   Review of patient past medical history, allergies, medications, and health status, including review of pertinent consultant reports was performed as part of comprehensive evaluation and provision of care management/care coordination services.   [name], [place] / [place]   [phone] 9:44 AM

## 2021-02-26 ENCOUNTER — Ambulatory Visit: Payer: Medicare PPO

## 2021-02-26 NOTE — Chronic Care Management (AMB) (Signed)
Chronic Care Management   CCM RN Visit Note  02/26/2021 Name: [name] MRN: [id number] DOB: 01-06-1941  Subjective: [name] is a 80 y.o. year old male who is a primary care patient of [name], DO. The care management team was consulted for assistance with disease management and care coordination needs.    Engaged with patient by telephone for follow up visit in response to provider referral for case management and/or care coordination services.   Consent to Services:  The patient was given information about Chronic Care Management services, agreed to services, and gave verbal consent prior to initiation of services.  Please see initial visit note for detailed documentation.   Patient agreed to services and verbal consent obtained.   Assessment: Patient's daughter provided all information during this encounter and the patient still continues to maintain.his level of health. See Care Plan below for interventions and patient self-care actives. Follow up Plan: Patient would like continued follow-up.  CCM RNCM will outreach the patient April 07, 2021. Patient will call office if needed prior to next encounter  Review of patient past medical history, allergies, medications, health status, including review of consultants reports, laboratory and other test data, was performed as part of comprehensive evaluation and provision of chronic care management services.   SDOH (Social Determinants of Health) assessments and interventions performed:    CCM Care Plan  Allergies  Allergen Reactions   Ace Inhibitors Swelling and Other (See Comments)    Site of swelling not known by the patient   Valsartan Swelling and Other (See Comments)    Site of swelling not known by the patient    Outpatient Encounter Medications as of 02/26/2021  Medication Sig   aspirin 81 MG chewable tablet Chew 1 tablet (81 mg total) by mouth daily.   atorvastatin (LIPITOR) 80 MG tablet Take 1 tablet (80  mg total) by mouth at bedtime.   empagliflozin (JARDIANCE) 10 MG TABS tablet Take 1 tablet (10 mg total) by mouth daily before breakfast.   furosemide (LASIX) 40 MG tablet Take 0.5 tablets (20 mg total) by mouth daily.   metoprolol succinate (TOPROL-XL) 25 MG 24 hr tablet Take 1 tablet (25 mg total) by mouth daily.   No facility-administered encounter medications on file as of 02/26/2021.    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 ([place])    Chronic HFrEF (heart failure with reduced ejection fraction) ([place])    Lives alone    High risk social situation    Episodic tension-type headache, not intractable 12/07/2019   Weight loss 02/06/2019   B12 deficiency 07/21/2017   Osteoarthritis 05/12/2017   Hyperlipidemia associated with type 2 diabetes mellitus ([place]) 12/20/2016   Dementia with behavioral disturbance ([place]) 07/06/2016   Insomnia 01/09/2015   Type 2 diabetes mellitus with neurological complications ([place]) 65/68/1275   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   Hypertension associated with diabetes ([place]) 04/21/2007   Chronic urticaria 04/21/2007    Conditions to be addressed/monitored:CHF  Care Plan : RN Case Manager  Updates made by [name], RN since 02/26/2021 12:00 AM  Problem: (Heart Failure)   Priority: High  Onset Date: 10/10/2020     Long-Range Goal: Caregiver patient will learn signs and symptoms of HF through monitoring for early detecttion to Prevented or Minimize Exacerbation   Start Date: 10/10/2020  Expected End Date: 04/06/2021  Priority:  High  Note:   Current Barriers:  Knowledge deficit related to basic heart failure pathophysiology and self care management Literacy Barriers- Daughter does not know a lot of information about  Heart Failure.  Case Manager Clinical Goal(s):  Caregiver/patient will verbalize understanding of Heart Failure Action Plan and when to call doctor  Interventions:  Collaboration with Richarda Osmond, DO regarding development and update of comprehensive plan of care as evidenced by provider attestation and co-signature Inter-disciplinary care team collaboration (see longitudinal plan of care) Basic overview and discussion of pathophysiology of Heart Failure reviewed  Provided verbal education on low sodium diet Advised caregiver/patient to weigh each morning after emptying bladder Discussed importance of daily weight and advised patient to weigh and record daily-  Perform or review cognitive and/or health literacy screening with caregiver. Reviewed medications with caregiver/patient and discussed importance of medication adherence-Daughter has received a pill dispenser for her father and she states that it works great.  She can fill it for 28 days and keep the rest of his medications with her. Discussed plans with patient for ongoing care management follow up and provided patient with direct contact information for care management team Review of patient status, including review of consultants reports, relevant laboratory and other test results, and medications completed. 02/26/21:  I spoke with [name] [name], and she stated that he is doing well. She was unable at this time to give me a weight. He is taking his medication. She has been taking him to the grocery store to get his food, and he is eating well. He is still walking to the store to buy cigarettes because she will not accommodate him with those. She has taken steps to become the POA. [name] has a Human resources officer on October 10; then,  on October 27, representatives from the court system will discuss the POA with her and her father.  Caregiver-Patient/Self-Care Activities: Caregiver/Patient verbalizes understanding of plan to monitor signs an symptoms of Heart Failure Self administers medications as prescribed Attends all scheduled provider appointments Calls pharmacy for medication refills Calls provider office for new concerns or questions  Caregiver/Patient Goals: - follow rescue plan if symptoms flare-up - know when to call the doctor - call office if I gain more than 2 pounds in one day or 5 pounds in one week - use salt in moderation - watch for swelling in feet, ankles and legs every day     [name] RN, BSN, [place] Phone: [phone] I Fax: [phone]

## 2021-02-26 NOTE — Patient Instructions (Signed)
Visit Information  Mr. Sada  it was nice speaking with you. Please call me directly [phone] if you have questions about the goals we discussed.   Goals Addressed             This Visit's Progress    Track and Manage Symptoms-Heart Failure       Timeframe:  Long-Range Goal Priority:  High Start Date:   10/10/20                       Expected End Date:  04/06/21          Follow Up Date 04/07/21   - develop a rescue plan - eat more whole grains, fruits and vegetables, lean meats and healthy fats - know when to call the doctor    Why is this important?   You will be able to handle your symptoms better if you keep track of them.  Making some simple changes to your lifestyle will help.  Eating healthy is one thing you can do to take good care of yourself.    Notes:        The patient verbalizes understanding of the information and instructions discussed today.  Our next appointment is scheduled for  04/07/21.   Please feel free to call me or the office if you have any questions or concerns.  [name] RN, BSN, [facility] Care Management Coordinator [place] Phone: [phone] I Fax: [phone]

## 2021-03-02 ENCOUNTER — Telehealth: Payer: Medicare PPO

## 2021-04-07 ENCOUNTER — Ambulatory Visit: Payer: Medicare PPO | Admitting: Licensed Clinical Social Worker

## 2021-04-07 ENCOUNTER — Ambulatory Visit: Payer: Medicare PPO

## 2021-04-07 DIAGNOSIS — Z636 Dependent relative needing care at home: Secondary | ICD-10-CM

## 2021-04-07 NOTE — Chronic Care Management (AMB) (Signed)
Chronic Care Management   CCM RN Visit Note  04/07/2021 Name: [name] MRN: [id number] DOB: 11/06/1940  Subjective: [name] is a 80 y.o. year old male who is a primary care patient of [name], DO. The care management team was consulted for assistance with disease management and care coordination needs.    Engaged with patient by telephone for follow up visit in response to provider referral for case management and/or care coordination services.   Consent to Services:  The patient was given information about Chronic Care Management services, agreed to services, and gave verbal consent prior to initiation of services.  Please see initial visit note for detailed documentation.   Patient agreed to services and verbal consent obtained.    Assessment: Patient's daughter provided all information during this encounter. The patient continues to maintain positive progress with care plan goals. The daughter is working to obtain POA of her father. . See Care Plan below for interventions and patient self-care actives. Follow up Plan: Patient would like continued follow-up.  CCM RNCM will outreach the patient within the next 5 weeks.  Patient will call office if needed prior to next encounter  Review of patient past medical history, allergies, medications, health status, including review of consultants reports, laboratory and other test data, was performed as part of comprehensive evaluation and provision of chronic care management services.   SDOH (Social Determinants of Health) assessments and interventions performed:    CCM Care Plan  Allergies  Allergen Reactions   Ace Inhibitors Swelling and Other (See Comments)    Site of swelling not known by the patient   Valsartan Swelling and Other (See Comments)    Site of swelling not known by the patient    Outpatient Encounter Medications as of 04/07/2021  Medication Sig   aspirin 81 MG chewable tablet Chew 1 tablet (81 mg total)  by mouth daily.   atorvastatin (LIPITOR) 80 MG tablet Take 1 tablet (80 mg total) by mouth at bedtime.   empagliflozin (JARDIANCE) 10 MG TABS tablet Take 1 tablet (10 mg total) by mouth daily before breakfast.   furosemide (LASIX) 40 MG tablet Take 0.5 tablets (20 mg total) by mouth daily.   metoprolol succinate (TOPROL-XL) 25 MG 24 hr tablet Take 1 tablet (25 mg total) by mouth daily.   No facility-administered encounter medications on file as of 04/07/2021.    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 ([place])    Chronic HFrEF (heart failure with reduced ejection fraction) ([place])    Lives alone    High risk social situation    Episodic tension-type headache, not intractable 12/07/2019   Weight loss 02/06/2019   B12 deficiency 07/21/2017   Osteoarthritis 05/12/2017   Hyperlipidemia associated with type 2 diabetes mellitus ([place]) 12/20/2016   Dementia with behavioral disturbance ([place]) 07/06/2016   Insomnia 01/09/2015   Type 2 diabetes mellitus with neurological complications ([place]) 32/99/2426   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   Hypertension associated with diabetes ([place]) 04/21/2007   Chronic urticaria 04/21/2007    Conditions to be addressed/monitored:CHF  Care Plan : RN Case  Manager  Updates made by [name], RN since 04/07/2021 12:00 AM     Problem: (Heart Failure)   Priority: High  Onset Date: 10/10/2020     Long-Range Goal: Caregiver patient will learn signs and symptoms of HF through monitoring for early detecttion to Prevented or Minimize  Exacerbation   Start Date: 10/10/2020  Expected End Date: 04/06/2021  Priority: High  Note:   Current Barriers:  Knowledge deficit related to basic heart failure pathophysiology and self care management Literacy Barriers- Daughter does not know a lot of information about  Heart Failure.  Case Manager Clinical Goal(s):  Caregiver/patient will verbalize understanding of Heart Failure Action Plan and when to call doctor  Interventions:  Collaboration with [name] Osmond, DO regarding development and update of comprehensive plan of care as evidenced by provider attestation and co-signature Inter-disciplinary care team collaboration (see longitudinal plan of care) Basic overview and discussion of pathophysiology of Heart Failure reviewed  Provided verbal education on low sodium diet Advised caregiver/patient to weigh each morning after emptying bladder Discussed importance of daily weight and advised patient to weigh and record daily-  Perform or review cognitive and/or health literacy screening with caregiver. Reviewed medications with caregiver/patient and discussed importance of medication adherence-Daughter has received a pill dispenser for her father and she states that it works great.  She can fill it for 28 days and keep the rest of his medications with her. Discussed plans with patient for ongoing care management follow up and provided patient with direct contact information for care management team Review of patient status, including review of consultants reports, relevant laboratory and other test results, and medications completed. 04/07/21:  I spoke with [name], [name] daughter, who said he is doing well. No problems with chest pain, shortness of breath, or swelling. She said that he gets out every day and walks to the store. He is taking his medications and eating. She has been appointed a Chief Executive Officer by the courts and working to become to the [employer]. I collaborated with Merilyn Baba  to help her understand the difference between guardianship and POA.   Caregiver-Patient/Self-Care Activities: Caregiver/Patient verbalizes understanding of plan to monitor signs an symptoms of Heart Failure Self administers medications as prescribed Attends all scheduled provider appointments Calls pharmacy for medication refills Calls provider office for new concerns or questions - follow rescue plan if symptoms flare-up - know when to call the doctor - call office if I gain more than 2 pounds in one day or 5 pounds in one week - use salt in moderation - watch for swelling in feet, ankles and legs every day     [name] RN, BSN, [place] Phone: [phone] I Fax: [phone]

## 2021-04-07 NOTE — Patient Instructions (Signed)
Licensed Clinical Social Worker Visit Information  Goals we discussed today:   Goals Addressed             This Visit's Progress    Caregiver Coping Optimized   On track    Patient Goals/Self-Care Activities:  Complete Guardianship process Apply for Medicaid       It was a pleasure speaking with you today. Please call the office if needed Per our conversation I will disconnect from the care team. Patient's caregiver verbalized understanding of instructions, educational materials, and care plan provided today and declined offer to receive copy of patient instructions, educational materials, and care plan.  [name], LCSW Care Management & Coordination  [phone]

## 2021-04-07 NOTE — Patient Instructions (Signed)
Visit Information  Mr. Route  it was nice speaking with you. Please call me directly [phone] if you have questions about the goals we discussed.  Caregiver-Patient/Self-Care Activities: Caregiver/Patient verbalizes understanding of plan to monitor signs an symptoms of Heart Failure Self administers medications as prescribed Attends all scheduled provider appointments Calls pharmacy for medication refills Calls provider office for new concerns or questions - follow rescue plan if symptoms flare-up - know when to call the doctor - call office if I gain more than 2 pounds in one day or 5 pounds in one week - use salt in moderation - watch for swelling in feet, ankles and legs every day   The patient verbalized understanding of instructions, educational materials, and care plan provided today and declined offer to receive copy of patient instructions, educational materials, and care plan.   Follow up Plan: Patient would like continued follow-up.  CCM RNCM will outreach the patient within the next 5 weeks.  Patient will call office if needed prior to next encounter  [name], RN  [phone]

## 2021-04-07 NOTE — Chronic Care Management (AMB) (Signed)
Care Management  Clinical Social Work Note  04/07/2021 Name: [name] MRN: [id number] DOB: May 19, 1941  [name] is a 80 y.o. year old male who is a primary care patient of [name], DO. The CCM team was consulted for assistance with care coordination needs. Level of Care Concerns and Caregiver Stress   Consent to Services:  The patient was given information about Care Management services, agreed to services, and gave verbal consent prior to initiation of services.  Please see initial visit note for detailed documentation.   Patient agreed to services today and consent obtained.  Collaboration with patient's daughter  for follow up visit in response to provider referral for social work care coordination services.   Assessment/Interventions: Assessed patient's current treatment, progress, coping skills, support system and barriers to care.  Caregiver is making progress with getting guardianship and or POA.  She has spoken with the court system and getting counsel from them with various options .  LCSW is unable to assist with PCS until patient has Medicaid.  Daughter will continue to work with court system to get needed information. See Care Plan below for interventions and patient self-care actives.  Follow up Plan:  Patient 's daughter does not require or desire continued follow-up by LCSW.  Will contact the office once she has Medicaid to set up PCS. LCSW will disconnect from care team please consult for future needs.   Review of patient past medical history, allergies, medications, and health status, including review of pertinent consultant reports was performed as part of comprehensive evaluation and provision of care management/care coordination services.   SDOH (Social Determinants of Health) screening and interventions performed today:   Advanced Directives Status:Not addressed in this encounter.     Care Plan    Conditions to be addressed/monitored per PCP order: ,  Level of care concerns  Care Plan : General Social Work (Adult)  Updates made by Maurine Cane, LCSW since 04/07/2021 12:00 AM     Problem: Caregiver Stress      Goal: Caregiver Coping Optimized   Start Date: 10/07/2020  This Visit's Progress: On track  Recent Progress: On track  Priority: High  Note:   Current barriers:  Continue decline in health Patient does not have HPOA  Level of care concerns, Medication procurement, and Memory Deficits Clinical Goals: Patient's daughter will work with LCSW to address needs related to caregiver resources for patient  Clinical Interventions:  Inter-disciplinary care team collaboration (see longitudinal plan of care) Assessment of needs, progress with care plan and barriers to care.  Daughter continues to work with attorney and courts for guardianship and POA  Collaborated with CCM RN for ongoing needs Other interventions provided:Active listening; Problem Solving [facility] , Caregiver stress acknowledged , Patient Goals/Self-Care Activities:  Complete Guardianship process Apply for Medicaid     [name], [place] / [place]   [phone]

## 2021-05-12 ENCOUNTER — Ambulatory Visit: Payer: Medicare PPO

## 2021-05-12 ENCOUNTER — Telehealth: Payer: Medicare PPO

## 2021-05-12 NOTE — Chronic Care Management (AMB) (Signed)
Chronic Care Management   CCM RN Visit Note  05/12/2021 Name: [name] MRN: [id number] DOB: [birth year]  Subjective: [name] is a 80 y.o. year old male who is a primary care patient of [name], DO. The care management team was consulted for assistance with disease management and care coordination needs.    Engaged with patient by telephone for follow up visit in response to provider referral for case management and/or care coordination services.   Consent to Services:  The patient was given information about Chronic Care Management services, agreed to services, and gave verbal consent prior to initiation of services.  Please see initial visit note for detailed documentation.   Patient agreed to services and verbal consent obtained.    Assessment: Patient's daughter provided all information during this encounter. The patient continues to maintain positive progress with care plan goals.. See Care Plan below for interventions and patient self-care actives. Follow up Plan: Patient would like continued follow-up.  CCM RNCM will outreach the patient within the next 6 weeks.  Patient will call office if needed prior to next encounter Review of patient past medical history, allergies, medications, health status, including review of consultants reports, laboratory and other test data, was performed as part of comprehensive evaluation and provision of chronic care management services.   SDOH (Social Determinants of Health) assessments and interventions performed:    CCM Care Plan  Allergies  Allergen Reactions   Ace Inhibitors Swelling and Other (See Comments)    Site of swelling not known by the patient   Valsartan Swelling and Other (See Comments)    Site of swelling not known by the patient    Outpatient Encounter Medications as of 05/12/2021  Medication Sig   aspirin 81 MG chewable tablet Chew 1 tablet (81 mg total) by mouth daily.   atorvastatin (LIPITOR) 80 MG tablet  Take 1 tablet (80 mg total) by mouth at bedtime.   empagliflozin (JARDIANCE) 10 MG TABS tablet Take 1 tablet (10 mg total) by mouth daily before breakfast.   furosemide (LASIX) 40 MG tablet Take 0.5 tablets (20 mg total) by mouth daily.   metoprolol succinate (TOPROL-XL) 25 MG 24 hr tablet Take 1 tablet (25 mg total) by mouth daily.   No facility-administered encounter medications on file as of 05/12/2021.    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 ([place])    Chronic HFrEF (heart failure with reduced ejection fraction) ([place])    Lives alone    High risk social situation    Episodic tension-type headache, not intractable 12/07/2019   Weight loss 02/06/2019   B12 deficiency 07/21/2017   Osteoarthritis 05/12/2017   Hyperlipidemia associated with type 2 diabetes mellitus ([place]) 12/20/2016   Dementia with behavioral disturbance ([place]) 07/06/2016   Insomnia 01/09/2015   Type 2 diabetes mellitus with neurological complications ([place]) 63/78/5885   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   Hypertension associated with diabetes ([place]) 04/21/2007   Chronic urticaria 04/21/2007    Conditions to be addressed/monitored:CHF  Care Plan : RN Case Manager  Updates made by [name], RN since 05/12/2021 12:00 AM  Problem: (Heart Failure)   Priority: High  Onset Date: 10/10/2020     Long-Range Goal: Caregiver patient will learn signs and symptoms of HF through monitoring for early detecttion to Prevented or Minimize Exacerbation   Start Date: 10/10/2020  Expected End Date:  07/07/2021  Priority: High  Note:   Current Barriers:  Knowledge deficit related to basic heart failure pathophysiology and self care management Literacy Barriers- Daughter does not know a lot of information about  Heart Failure.  Case Manager Clinical Goal(s):  Caregiver/patient will verbalize understanding of Heart Failure Action Plan and when to call doctor  Interventions:  Collaboration with Richarda Osmond, DO regarding development and update of comprehensive plan of care as evidenced by provider attestation and co-signature Inter-disciplinary care team collaboration (see longitudinal plan of care) Basic overview and discussion of pathophysiology of Heart Failure reviewed  Provided verbal education on low sodium diet Reviewed medications with caregiver/patient and discussed importance of medication adherence-Daughter has received a pill dispenser for her father and she states that it works great.  Discussed plans with patient for ongoing care management follow up and provided patient with direct contact information for care management team Review of patient status, including review of consultants reports, relevant laboratory and other test results, and medications completed. 05/12/21:  I spoke with [facility] today, and she said that she had yet to move forward with any work with the lawyers to get the POA or guardianship with her father. He is doing well, taking his medications, and eating well.  [name] took him to the grocery store and said he would sometimes walk to the corner store if he wanted something other than what he purchased at the grocery store. She states that he has no complaints of chest pain, shortness of breath, or swelling.   Patient Goals/Self Care Activities: -Patient/Caregiver will self-administer medications as prescribed as evidenced by self-report/primary caregiver report  -Patient/Caregiver will attend all scheduled provider appointments as evidenced by  clinician review of documented attendance to scheduled appointments and patient/caregiver report -Patient/Caregiver will call pharmacy for medication refills as evidenced by patient report and review of pharmacy fill history as appropriate -Patient/Caregiver will call provider office for new concerns or questions as evidenced by review of documented incoming telephone call notes and patient report -Patient/Caregiver verbalizes understanding of plan -Patient/Caregiver will focus on medication adherence by taking medications as prescribed -Follow CHF Action Plan -Adhere to low sodium diet -[name] will follow up with the paper work for [employer] or guardianship     [name] RN, BSN, [facility] Care Management Coordinator [place] Phone: [phone] I Fax: [phone]

## 2021-05-12 NOTE — Patient Instructions (Signed)
Visit Information  Mr. Belmares  it was nice speaking with you. Please call me directly [phone] if you have questions about the goals we discussed.  Patient Goals/Self Care Activities: -Patient/Caregiver will self-administer medications as prescribed as evidenced by self-report/primary caregiver report  -Patient/Caregiver will attend all scheduled provider appointments as evidenced by clinician review of documented attendance to scheduled appointments and patient/caregiver report -Patient/Caregiver will call pharmacy for medication refills as evidenced by patient report and review of pharmacy fill history as appropriate -Patient/Caregiver will call provider office for new concerns or questions as evidenced by review of documented incoming telephone call notes and patient report -Patient/Caregiver verbalizes understanding of plan -Patient/Caregiver will focus on medication adherence by taking medications as prescribed -Follow CHF Action Plan -Adhere to low sodium diet -[name] will follow up with the paper work for [facility] or guardianship   The patient verbalized understanding of instructions, educational materials, and care plan provided today and declined offer to receive copy of patient instructions, educational materials, and care plan.   Follow up Plan: Patient would like continued follow-up.  CCM RNCM will outreach the patient within the next 6 weeks.  Patient will call office if needed prior to next encounter  [name], RN  [phone]

## 2021-06-23 ENCOUNTER — Ambulatory Visit: Payer: Medicare PPO

## 2021-06-23 NOTE — Chronic Care Management (AMB) (Signed)
Chronic Care Management   CCM RN Visit Note  06/23/2021 Name: [name] MRN: [id number] DOB: 1940/07/24  Subjective: [name] is a 81 y.o. year old male who is a primary care patient of [name], DO. The care management team was consulted for assistance with disease management and care coordination needs.    Engaged with patient by telephone for follow up visit in response to provider referral for case management and/or care coordination services.   Consent to Services:  The patient was given information about Chronic Care Management services, agreed to services, and gave verbal consent prior to initiation of services.  Please see initial visit note for detailed documentation.   Patient agreed to services and verbal consent obtained.    Summary: Patient's daughter [name] provided all information during this encounter. The patient is making progress with his chronic condition . See Care Plan below for interventions and patient self-care actives.  Recommendation: The patient may benefit from [facility] and  I making a conference call to  Social Service to see if the patient has a medicaid card.  The family would like to have the patient  a Physiological scientist in the home.  Follow up Plan: Patient would like continued follow-up.  CCM RNCM will outreach the patient within the next 3 weeks.  Patient will call office if needed prior to next encounter    Assessment: Review of patient past medical history, allergies, medications, health status, including review of consultants reports, laboratory and other test data, was performed as part of comprehensive evaluation and provision of chronic care management services.   SDOH (Social Determinants of Health) assessments and interventions performed:    CCM Care Plan     Conditions to be addressed/monitored:CHF  Care Plan : RN Case Manager  Updates made by [name], RN since 06/23/2021 12:00 AM     Problem: (Heart Failure)    Priority: High  Onset Date: 10/10/2020     Long-Range Goal: Caregiver patient will learn signs and symptoms of HF through monitoring for early detecttion to Prevented or Minimize Exacerbation   Start Date: 10/10/2020  Expected End Date: 08/04/2021  Priority: High  Note:   Current Barriers:  Knowledge deficit related to basic heart failure pathophysiology and self care management Literacy Barriers- Daughter does not know a lot of information about  Heart Failure.  Case Manager Clinical Goal(s):  Caregiver/patient will verbalize understanding of Heart Failure Action Plan and when to call doctor  Interventions: 1:1 collaboration with primary care provider regarding development and update of comprehensive plan of care as evidenced by provider attestation and co-signature Inter-disciplinary care team collaboration (see longitudinal plan of care) Evaluation of current treatment plan related to  self management and patient's adherence to plan as established by provider   Heart Failure Interventions:  (Status: Goal on Track (progressing): YES.)  Long Term Goal  Wt Readings from Last 3 Encounters:  11/05/20 129 lb (58.5 kg)  10/16/20 131 lb 3.2 oz (59.5 kg)  10/09/20 125 lb (56.7 kg)  Collaboration with Richarda Osmond, DO regarding development and update of comprehensive plan of care as evidenced by provider attestation and co-signature Inter-disciplinary care team collaboration (see longitudinal plan of care) Provided verbal education on low sodium diet Reviewed medications with caregiver/patient and discussed importance of medication adherence-Daughter has received a pill dispenser for her father and she states that it works great.  Discussed plans with patient for ongoing care management follow up and provided patient with direct contact information for  care management team Review of patient status, including review of consultants reports, relevant laboratory and other test results, and  medications completed. 1/17/23Odis [name] states that her father is doing well and taking his medications. He has not complained of chest pain, shortness of breath, or swelling. She says that he does not weigh, nor has he ever considered weighing. She notified me that she had obtained guardianship from her father. I asked her to send a copy of the paper to have it scanned into the system.     Patient Goals/Self Care Activities: -Patient/Caregiver will self-administer medications as prescribed as evidenced by self-report/primary caregiver report  -Patient/Caregiver will attend all scheduled provider appointments as evidenced by clinician review of documented attendance to scheduled appointments and patient/caregiver report -Patient/Caregiver will call pharmacy for medication refills as evidenced by patient report and review of pharmacy fill history as appropriate -Patient/Caregiver will call provider office for new concerns or questions as evidenced by review of documented incoming telephone call notes and patient report -Patient/Caregiver verbalizes understanding of plan -Patient/Caregiver will focus on medication adherence by taking medications as prescribed -Follow CHF Action Plan -Adhere to low sodium diet      [name] RN, BSN, [place] Management Coordinator [facility]  Phone: [phone]

## 2021-06-23 NOTE — Patient Instructions (Signed)
Visit Information  Mr. [name]  it was nice speaking with you. Please call me directly [phone] if you have questions about the goals we discussed.  Patient Goals/Self Care Activities: -Patient/Caregiver will self-administer medications as prescribed as evidenced by self-report/primary caregiver report  -Patient/Caregiver will attend all scheduled provider appointments as evidenced by clinician review of documented attendance to scheduled appointments and patient/caregiver report -Patient/Caregiver will call pharmacy for medication refills as evidenced by patient report and review of pharmacy fill history as appropriate -Patient/Caregiver will call provider office for new concerns or questions as evidenced by review of documented incoming telephone call notes and patient report -Patient/Caregiver verbalizes understanding of plan -Patient/Caregiver will focus on medication adherence by taking medications as prescribed -Follow CHF Action Plan -Adhere to low sodium diet     The patient verbalized understanding of instructions, educational materials, and care plan provided today and declined offer to receive copy of patient instructions, educational materials, and care plan.   Follow up Plan: Patient would like continued follow-up.  CCM RNCM will outreach the patient within the next 3 weeks.  Patient will call office if needed prior to next encounter  [name], RN  [phone]

## 2021-07-15 ENCOUNTER — Ambulatory Visit: Payer: Medicare PPO

## 2021-07-16 NOTE — Chronic Care Management (AMB) (Signed)
Chronic Care Management   CCM RN Visit Note  07/16/2021 Name: [name] MRN: [id number] DOB: 06-11-1940  Subjective: [name] is a 81 y.o. year old male who is a primary care patient of [name] Gilding, DO. The care management team was consulted for assistance with disease management and care coordination needs.    Engaged with patient by telephone for follow up visit in response to provider referral for case management and/or care coordination services.   Consent to Services:  The patient was given information about Chronic Care Management services, agreed to services, and gave verbal consent prior to initiation of services.  Please see initial visit note for detailed documentation.   Patient agreed to services and verbal consent obtained.    Summary: Patient's daughter provided all information during this encounter. The patient continues to maintain positive progress with care plan goals [name] and I are working together to help her father get Medicaid . See Care Plan below for interventions and patient self-care actives.  Recommendation: The patient may benefit from continuing to take his medications and following the CHF protocol,  [name] will keep in contact with social service and [name] BSW to help her father obtain his full medicaid to receive other resources.  She agreed.  Follow up Plan: Patient would like continued follow-up.  CCM RNCM will outreach the patient within the next 7 weeks.  Patient will call office if needed prior to next encounter   Assessment: Review of patient past medical history, allergies, medications, health status, including review of consultants reports, laboratory and other test data, was performed as part of comprehensive evaluation and provision of chronic care management services.   SDOH (Social Determinants of Health) assessments and interventions performed:  No  CCM Care Plan  Conditions to be addressed/monitored:CHF  Care  Plan : RN Case Manager  Updates made by [name] Arms, RN since 07/16/2021 12:00 AM     Problem: (Heart Failure)   Priority: High  Onset Date: 10/10/2020     Long-Range Goal: Caregiver patient will learn signs and symptoms of HF through monitoring for early detecttion to Prevented or Minimize Exacerbation   Start Date: 10/10/2020  Expected End Date: 09/04/2021  Priority: High  Note:   Current Barriers:  Knowledge deficit related to basic heart failure pathophysiology and self care management Literacy Barriers- Daughter does not know a lot of information about  Heart Failure.  Case Manager Clinical Goal(s):  Caregiver/patient will verbalize understanding of Heart Failure Action Plan and when to call doctor  Interventions: 1:1 collaboration with primary care provider regarding development and update of comprehensive plan of care as evidenced by provider attestation and co-signature Inter-disciplinary care team collaboration (see longitudinal plan of care) Evaluation of current treatment plan related to  self management and patient's adherence to plan as established by provider   Heart Failure Interventions:  (Status: Goal on Track (progressing): YES.)  Long Term Goal  Wt Readings from Last 3 Encounters:  11/05/20 129 lb (58.5 kg)  10/16/20 131 lb 3.2 oz (59.5 kg)  10/09/20 125 lb (56.7 kg)  Collaboration with [name] Gilding, DO regarding development and update of comprehensive plan of care as evidenced by provider attestation and co-signature Inter-disciplinary care team collaboration (see longitudinal plan of care) Provided verbal education on low sodium diet Reviewed medications with caregiver/patient and discussed importance of medication adherence-Daughter has received a pill dispenser for her father and she states that it works great.  Discussed plans with patient for ongoing care management follow  up and provided patient with direct contact information for care management team Review  of patient status, including review of consultants reports, relevant laboratory and other test results, and medications completed. 07/15/21:  I called and spoke with [name] regarding her father's health. She states that he continues to do well. He has no complaints of shortness of breath, chest pain, or swelling. He is taking his medications because the medication machine gives his meds out at prescribed times. We also discussed his Medicaid card. On a conference, I dialed Social Services at [phone] and spoke with [name]. She said that [name] did not have a full Medicaid card, but we informed her that we would like to apply for full Medicaid. [name] is going to have a Proofreader back. [name] gave me another call and said that Social Services called her back and that [name] Case Worker is [name] [phone], and she would need an FL2 to help [name]. I informed [name] that I would talk with My co-partner [name] and have her call and help her with the process. She agreed.  Patient Goals/Self Care Activities: -Patient/Caregiver will self-administer medications as prescribed as evidenced by self-report/primary caregiver report  -Patient/Caregiver will attend all scheduled provider appointments as evidenced by clinician review of documented attendance to scheduled appointments and patient/caregiver report -Patient/Caregiver will call pharmacy for medication refills as evidenced by patient report and review of pharmacy fill history as appropriate -Patient/Caregiver will call provider office for new concerns or questions as evidenced by review of documented incoming telephone call notes and patient report -Patient/Caregiver verbalizes understanding of plan -Patient/Caregiver will focus on medication adherence by taking medications as prescribed -Follow CHF Action Plan -Adhere to low sodium diet -[name] will expect call from [name] and work with  her regarding Medicaid     [name] Arms RN, BSN, [facility] Care Management Coordinator [facility]  Phone: [phone]

## 2021-07-16 NOTE — Patient Instructions (Signed)
Visit Information  [name]  it was nice speaking with you. Please call me directly [phone] if you have questions about the goals we discussed.   Patient Goals/Self Care Activities: -Patient/Caregiver will self-administer medications as prescribed as evidenced by self-report/primary caregiver report  -Patient/Caregiver will attend all scheduled provider appointments as evidenced by clinician review of documented attendance to scheduled appointments and patient/caregiver report -Patient/Caregiver will call pharmacy for medication refills as evidenced by patient report and review of pharmacy fill history as appropriate -Patient/Caregiver will call provider office for new concerns or questions as evidenced by review of documented incoming telephone call notes and patient report -Patient/Caregiver verbalizes understanding of plan -Patient/Caregiver will focus on medication adherence by taking medications as prescribed -Follow CHF Action Plan -Adhere to low sodium diet -[name] will expect call from [facility] and work with her regarding Medicaid    The patient verbalized understanding of instructions, educational materials, and care plan provided today and agreed to receive a mailed copy of patient instructions, educational materials, and care plan.   Follow up Plan: Patient would like continued follow-up.  CCM RNCM will outreach the patient within the next 7 weeks.  Patient will call office if needed prior to next encounter  [name], RN  [phone]

## 2021-08-11 IMAGING — US US THYROID
1 series · 14 of 25 positions shown · non-contrast
Comparison: Ultrasound neck soft tissue 12/19/2008

CLINICAL DATA: Goiter.

EXAM:
THYROID ULTRASOUND
TECHNIQUE: Ultrasound examination of the thyroid gland and adjacent soft
tissues was performed.

[Series 1: us thyroid · 0.14mm/px · 14 of 37 slices shown]
[im 1/37]
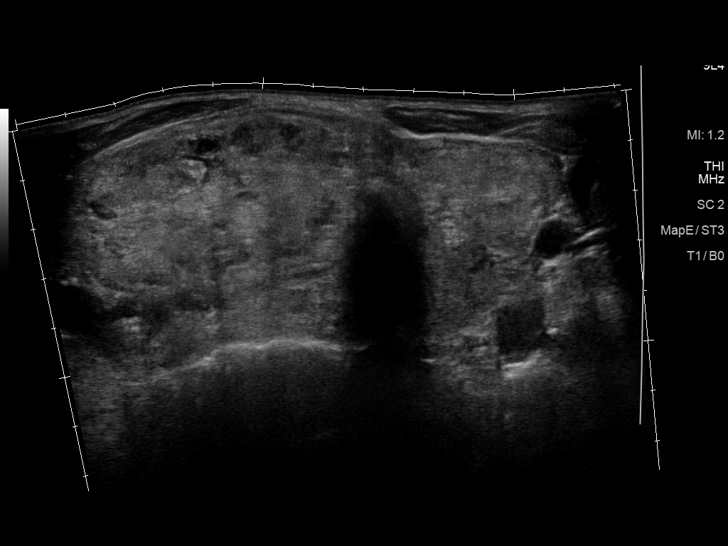
[im 4/37]
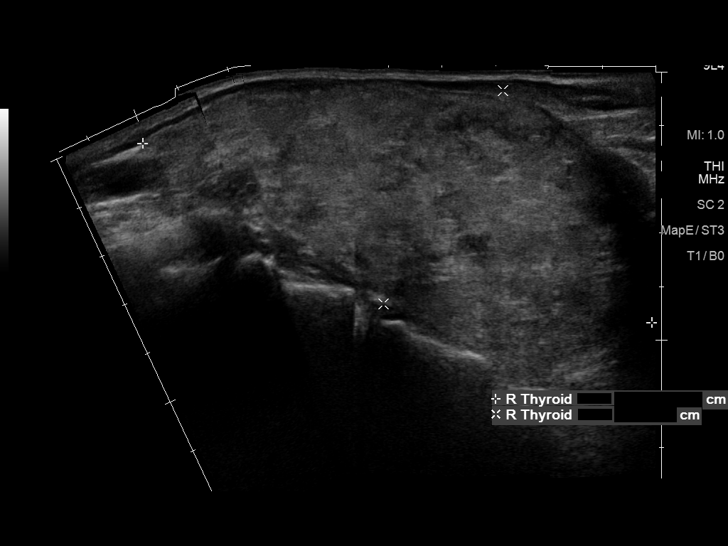
[im 7/37]
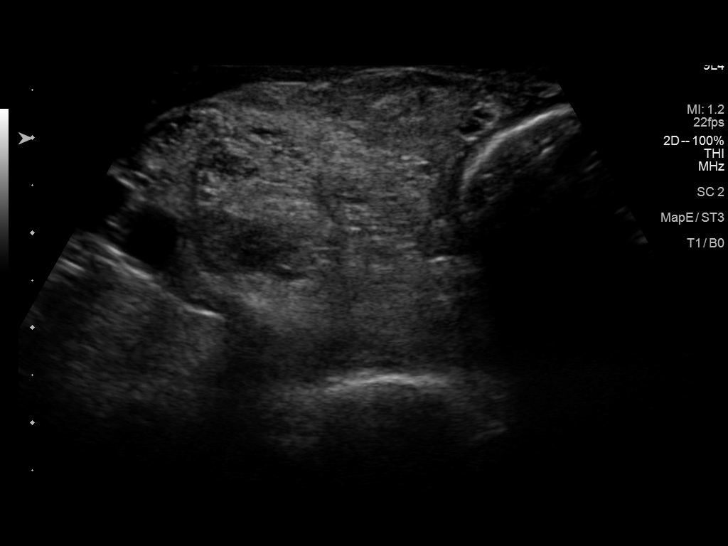
[im 10/37]
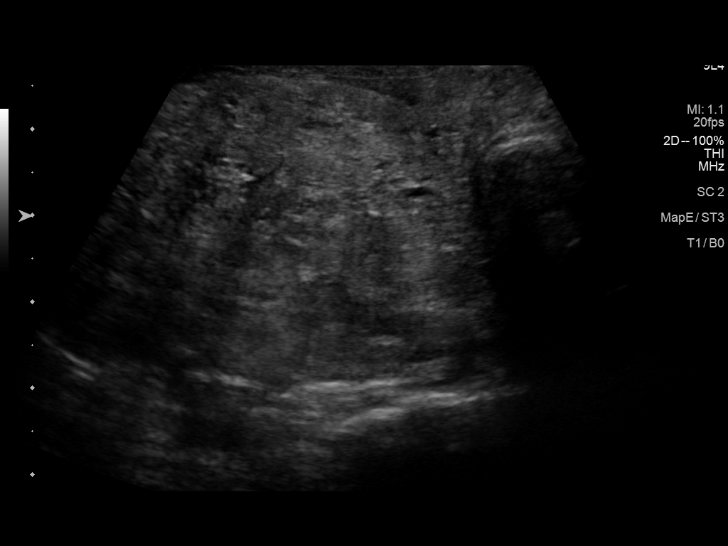
[im 13/37]
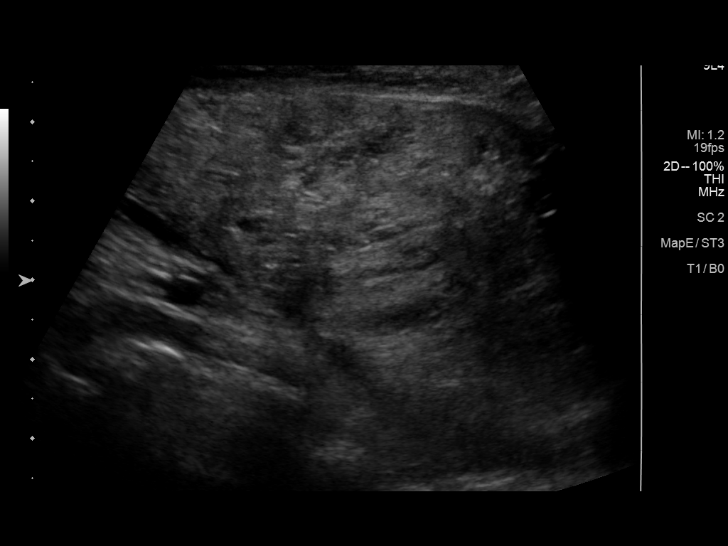
[im 14/37]
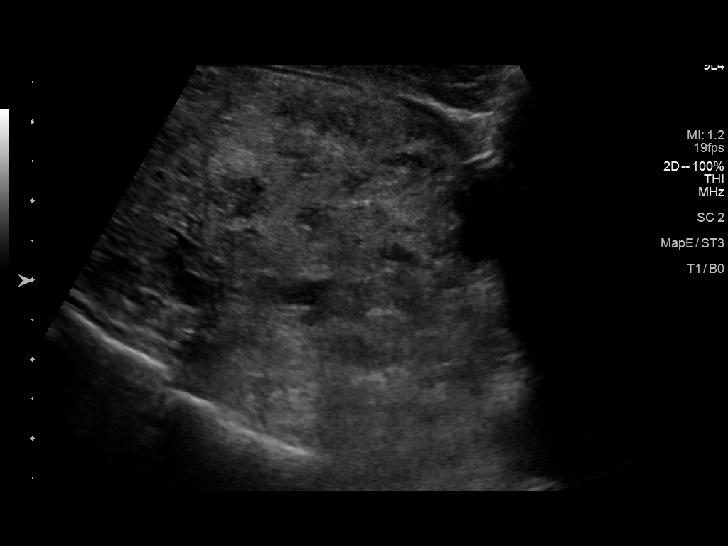
[im 17/37]
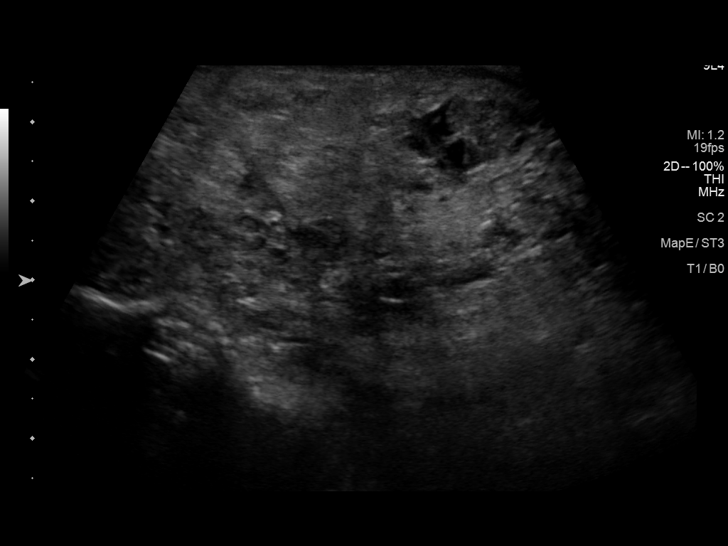
[im 20/37]
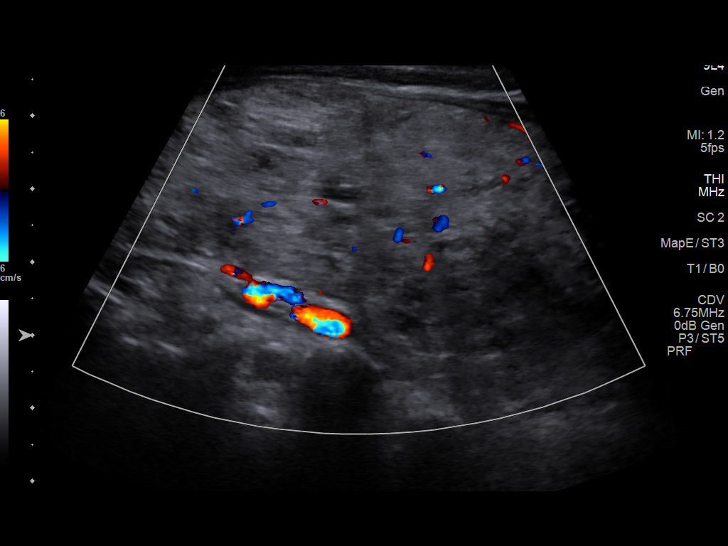
[im 23/37]
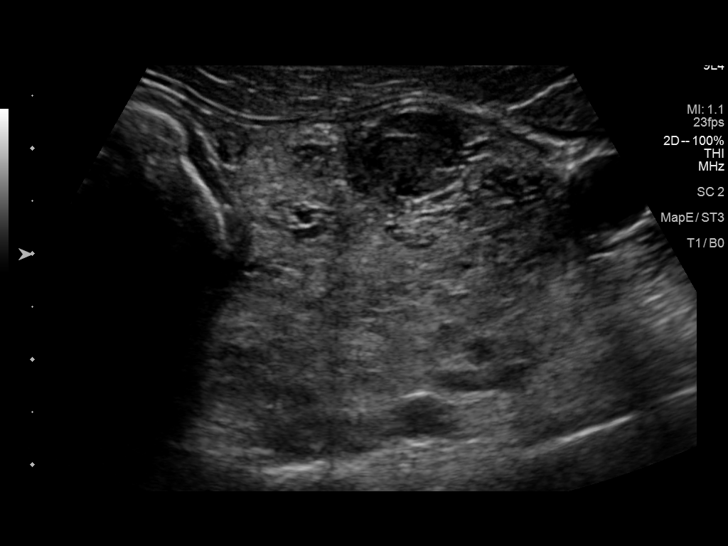
[im 25/37]
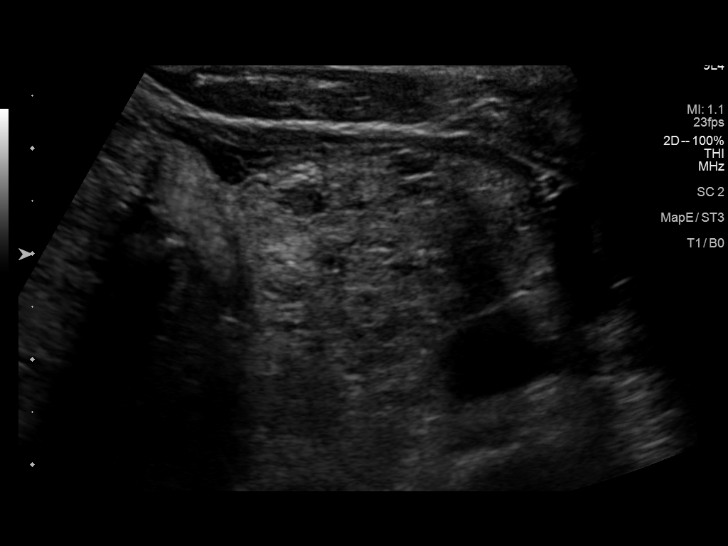
[im 28/37]
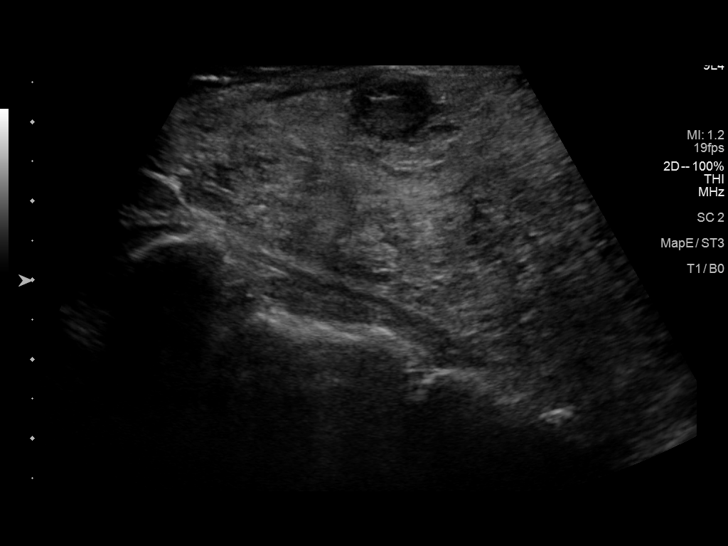
[im 31/37]
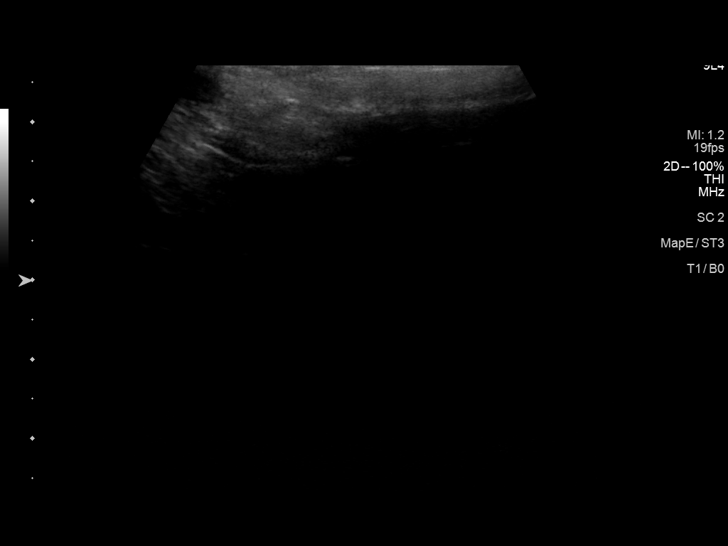
[im 34/37]
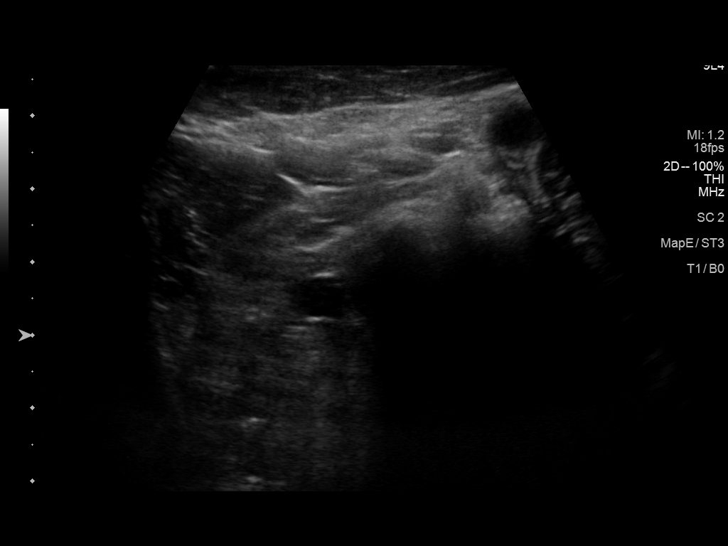
[im 37/37]
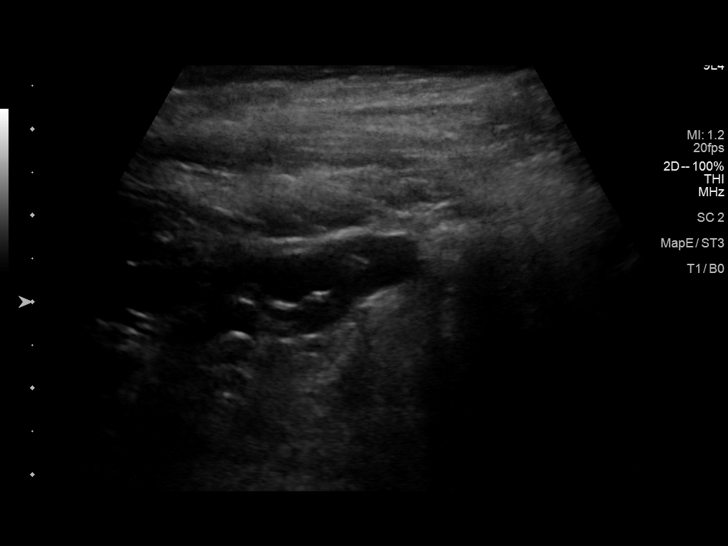

[14 of 25 positions shown; findings below may reference images not displayed]

FINDINGS: Parenchymal Echotexture: Moderately heterogeneous

Isthmus: 0.5 cm

Right lobe: 10.0 x 4.5 x 5.9 cm

Left lobe: 8.9 x 4.0 x 3.4 cm

_________________________________________________________

Estimated total number of nodules >/= 1 cm: 0

Number of spongiform nodules >/=  2 cm not described below (TR1): 0

Number of mixed cystic and solid nodules >/= 1.5 cm not described
below (TR2): 0

_________________________________________________________

No discrete nodules are seen within the thyroid gland.
IMPRESSION: Diffusely enlarged and heterogeneous thyroid without discrete
nodule.

The above is in keeping with the ACR TI-RADS recommendations - [HOSPITAL] 0342;[DATE].

## 2021-08-12 ENCOUNTER — Ambulatory Visit: Payer: Medicare PPO | Admitting: Licensed Clinical Social Worker

## 2021-08-13 NOTE — Chronic Care Management (AMB) (Signed)
?  Care Management  ? ?Social Work Visit Note ? ?08/13/2021 ?Name: [name] MRN: [id number] DOB: [birth year] ? ?[name] is a 81 y.o. year old male who sees [name], DO for primary care. The care management team was consulted for assistance with care management and care coordination needs related to Level of Care Concerns  ? ?Patient was given the following information about care management and care coordination services today, agreed to services, and gave verbal consent: 1.care management/care coordination services include personalized support from designated clinical staff supervised by their physician, including individualized plan of care and coordination with other care providers 2. 24/7 contact phone numbers for assistance for urgent and routine care needs. 3. The patient may stop care management/care coordination services at any time by phone call to the office staff. ? ?Engaged with patient by telephone for initial visit in response to provider referral for social work chronic care management and care coordination services. ? ?Assessment: Review of patient history, allergies, and health status during evaluation of patient need for care management/care coordination services.   ? ?Interventions:  ?Patient interviewed and appropriate assessments performed ?Collaborated with clinical team regarding patient needs  ?Patient is requesting provider to place an order for patient to have PCS services.Patient doesn't have medicaid. Patient has [employer] PPO. SW will request provider to place order.  ?Patient discussed no additional concerns. ? ?SDOH (Social Determinants of Health) assessments performed: Yes ?   ? ?Plan:  ?patient will work with BSW to address needs related to Level of care concerns ?Social Worker will send provider a inbasket message and follow up with patient within 30 days. .  ? ? ?[name], BSW  ?Social Worker ?[facility] Care Management  ?[phone] ?   ? ? ? ? ? ? ? ? ? ? ? ? ? ?

## 2021-08-13 NOTE — Patient Instructions (Signed)
Visit Information ? ?Instructions: patient will work with SW to address concerns related to level of care. ? ?Patient was given the following information about care management and care coordination services today, agreed to services, and gave verbal consent: 1.care management/care coordination services include personalized support from designated clinical staff supervised by their physician, including individualized plan of care and coordination with other care providers 2. 24/7 contact phone numbers for assistance for urgent and routine care needs. 3. The patient may stop care management/care coordination services at any time by phone call to the office staff. ? ?Patient verbalizes understanding of instructions and care plan provided today and agrees to view in MyChart. Active MyChart status confirmed with patient.   ? ?The care management team will reach out to the patient again over the next 30 days.  ? ?Lumina Gitto, BSW  ?Social Worker ?[facility] Care Management  ?[phone] ?  ? ?  ?

## 2021-08-14 ENCOUNTER — Ambulatory Visit: Payer: Medicare PPO

## 2021-08-15 NOTE — Chronic Care Management (AMB) (Signed)
?Chronic Care Management  ? ?CCM RN Visit Note ? ?08/15/2021 ?Name: [name] MRN: [id number] DOB: December 25, 1940 ? ?Subjective: ?[name] is a 81 y.o. year old male who is a primary care patient of [name], DO. The care management team was consulted for assistance with disease management and care coordination needs.   ? ?Engaged with patient by telephone for follow up visit in response to provider referral for case management and/or care coordination services.  ? ?Consent to Services:  ?The patient was given information about Chronic Care Management services, agreed to services, and gave verbal consent prior to initiation of services.  Please see initial visit note for detailed documentation.  ? ?Patient agreed to services and verbal consent obtained.  ? ? ?Summary: Patient's daughter and guardian provided all information during this encounter. The patient continues to maintain positive progress with care plan goals. See Care Plan below for interventions and patient self-care actives. ? ?Recommendation: The patient may benefit from continue to take his medications, follow HF action plan, make appointment with physician for routine checkup, [name] will work with [name] to obtain medicaid card.  The Patient/Guardian is in agreement ? ?Follow up Plan: Patient would like continued follow-up.  CCM RNCM will outreach the patient within the next 5 weeks.  Patient will call office if needed prior to next encounter ? ? ?Assessment: Review of patient past medical history, allergies, medications, health status, including review of consultants reports, laboratory and other test data, was performed as part of comprehensive evaluation and provision of chronic care management services.  ? ?SDOH (Social Determinants of Health) assessments and interventions performed:  No ? ?CCM Care Plan ? ? ?Conditions to be addressed/monitored:CHF ? ?Care Plan : RN Case Manager  ?Updates made by [name], RN since 08/15/2021  12:00 AM  ?  ? ?Problem: (Heart Failure)   ?Priority: High  ?Onset Date: 10/10/2020  ?  ? ?Long-Range Goal: Caregiver patient will learn signs and symptoms of HF through monitoring for early detecttion to Prevented or Minimize Exacerbation   ?Start Date: 10/10/2020  ?Expected End Date: 09/04/2021  ?Priority: High  ?Note:   ?Current Barriers:  ?Knowledge deficit related to basic heart failure pathophysiology and self care management ?Literacy Barriers- Daughter does not know a lot of information about  Heart Failure. ? ?Case Manager Clinical Goal(s):  ?Caregiver/patient will verbalize understanding of Heart Failure Action Plan and when to call doctor ? ?Interventions: ?1:1 collaboration with primary care provider regarding development and update of comprehensive plan of care as evidenced by provider attestation and co-signature ?Inter-disciplinary care team collaboration (see longitudinal plan of care) ?Evaluation of current treatment plan related to  self management and patient's adherence to plan as established by provider ? ? ?Heart Failure Interventions:  (Status: Goal on Track (progressing): YES.)  Long Term Goal  ?Wt Readings from Last 3 Encounters:  ?11/05/20 129 lb (58.5 kg)  ?10/16/20 131 lb 3.2 oz (59.5 kg)  ?10/09/20 125 lb (56.7 kg)  ?Collaboration with [name], DO regarding development and update of comprehensive plan of care as evidenced by provider attestation and co-signature ?Inter-disciplinary care team collaboration (see longitudinal plan of care) ?Discussed plans with patient for ongoing care management follow up and provided patient with direct contact information for care management team ?Review of patient status, including review of consultants reports, relevant laboratory and other test results, and medications completed. ?08/14/21:  [name] states that her father is doing well, with no complaints of chest pain, shortness of breath, or  swelling. He is taking his medications and getting  exercise daily by walking. [name] is still working to obtain a Medicaid card for her father to see about a Physiological scientist. she has talked with [name] Social Worker in the clinic. I collaborated with her and gave her a brief rundown of our conversation that [employer] and I had with [employer]. She plans to follow up with them. ? ?Patient Goals/Self Care Activities: ?-Patient/Caregiver will self-administer medications as prescribed as evidenced by self-report/primary caregiver report  ?-Patient/Caregiver will attend all scheduled provider appointments as evidenced by clinician review of documented attendance to scheduled appointments and patient/caregiver report ?-Patient/Caregiver will call pharmacy for medication refills as evidenced by patient report and review of pharmacy fill history as appropriate ?-Patient/Caregiver will call provider office for new concerns or questions as evidenced by review of documented incoming telephone call notes and patient report ?-Patient/Caregiver verbalizes understanding of plan ?-Patient/Caregiver will focus on medication adherence by taking medications as prescribed ?-Follow CHF Action Plan ?-Adhere to low sodium diet ?-[name] will work with [name] regarding Medicaid card ?-Call and get an appointment with Physician for check up ?  ? ? ? ?[name] RN, BSN, Painter ?Care Management Coordinator ?[place]  ?Phone: [phone]  ?  ? ? ? ? ? ? ? ?

## 2021-08-15 NOTE — Patient Instructions (Signed)
Visit Information ? ?Mr. Bogosian  it was nice speaking with you. Please call me directly [phone] if you have questions about the goals we discussed. ? ?Patient Goals/Self Care Activities: ?-Patient/Caregiver will self-administer medications as prescribed as evidenced by self-report/primary caregiver report  ?-Patient/Caregiver will attend all scheduled provider appointments as evidenced by clinician review of documented attendance to scheduled appointments and patient/caregiver report ?-Patient/Caregiver will call pharmacy for medication refills as evidenced by patient report and review of pharmacy fill history as appropriate ?-Patient/Caregiver will call provider office for new concerns or questions as evidenced by review of documented incoming telephone call notes and patient report ?-Patient/Caregiver verbalizes understanding of plan ?-Patient/Caregiver will focus on medication adherence by taking medications as prescribed ?-Follow CHF Action Plan ?-Adhere to low sodium diet ?-[name] will work with [name] regarding Medicaid card ?-Call and get an appointment with Physician for check up ?   ?  ? ? ? ?The patient verbalized understanding of instructions, educational materials, and care plan provided today and agreed to receive a mailed copy of patient instructions, educational materials, and care plan.  ? ?Follow up Plan: Patient would like continued follow-up.  CCM RNCM will outreach the patient within the next 5 weeks.  Patient will call office if needed prior to next encounter ? ?[name], RN ? ?[phone]  ?

## 2021-08-18 IMAGING — MR MR HEAD WO/W CM
12 series · 48 of 48 positions shown · IV contrast (multihance)
Comparison: Prior head CT examinations 07/31/2020 and earlier.

CLINICAL DATA: Alzheimer's disease, unspecified. Dementia,
Alzheimer's suspected. Additional history provided by technologist:
Dementia, memory loss; appetite loss, balance issues.

EXAM:
MRI HEAD WITHOUT AND WITH CONTRAST
TECHNIQUE: Multiplanar, multiecho pulse sequences of the brain and surrounding
structures were obtained without and with intravenous contrast.
CONTRAST:  12mL MULTIHANCE GADOBENATE DIMEGLUMINE 529 MG/ML IV SOLN

[Series 2: T1 · sagittal · 5.0mm · 0.45mm/px · 1 of 23 slices shown]
[im 1/23]
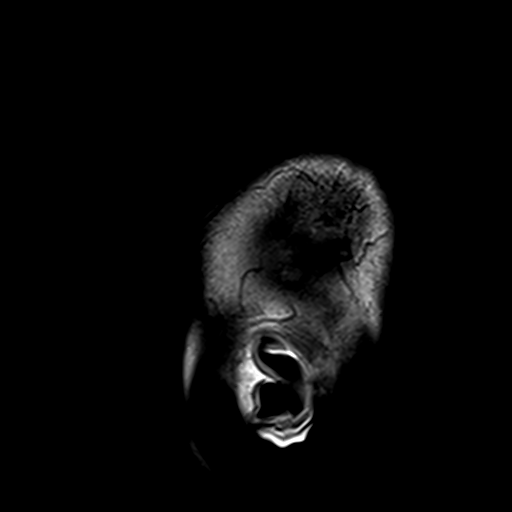

[Series 3: DWI · axial · 3.0mm · 1.80mm/px · z∈[-91,+64]mm · 7 of 105 slices shown (1 of 4)]
[im 1/105]
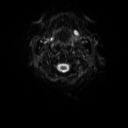
[im 18/105]
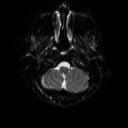
[im 35/105]
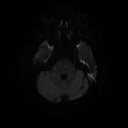
[im 53/105]
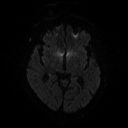
[im 70/105]
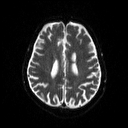
[im 87/105]
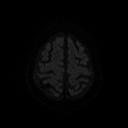
[im 105/105]
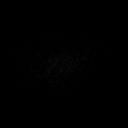

[Series 4: DWI · axial · 3.0mm · 1.80mm/px · z∈[-91,+64]mm · 3 of 49 slices shown (2 of 4)]
[im 1/49]
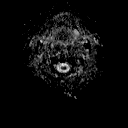
[im 25/49]
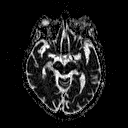
[im 49/49]
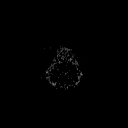

[Series 5: DWI · coronal · 5.0mm · 1.80mm/px · 5 of 69 slices shown (3 of 4)]
[im 1/69]
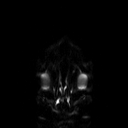
[im 18/69]
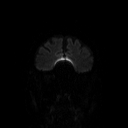
[im 35/69]
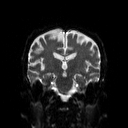
[im 52/69]
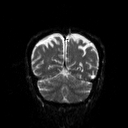
[im 69/69]
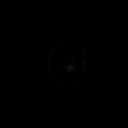

[Series 6: DWI · coronal · 5.0mm · 1.80mm/px · 2 of 35 slices shown (4 of 4)]
[im 1/35]
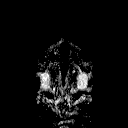
[im 35/35]
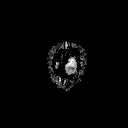

[Series 7: T2 · axial · 5.0mm · 0.60mm/px · 1 of 22 slices shown (1 of 2)]
[im 1/22]
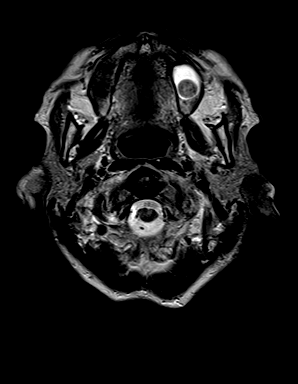

[Series 8: FLAIR · axial · 3.0mm · 0.45mm/px · z∈[-88,+60]mm · 2 of 33 slices shown]
[im 1/33]
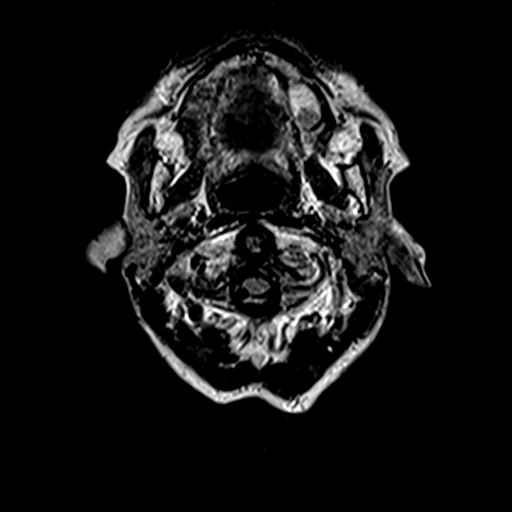
[im 33/33]
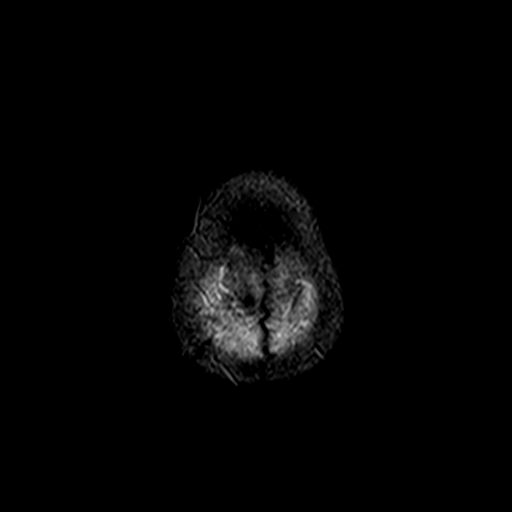

[Series 10: swi_images · axial · 4.0mm · 0.90mm/px · z∈[-91,+64]mm · 3 of 40 slices shown]
[im 1/40]
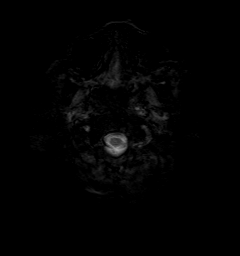
[im 20/40]
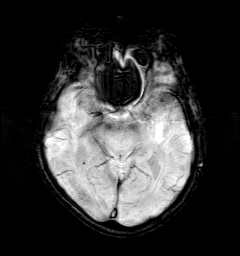
[im 40/40]
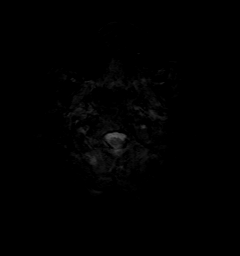

[Series 11: t1_mpr_tra · axial · 1.0mm · 0.75mm/px · z∈[-85,+57]mm · 10 of 144 slices shown]
[im 1/144]
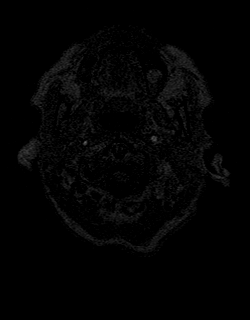
[im 16/144]
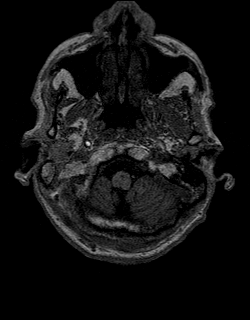
[im 32/144]
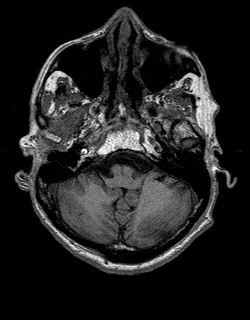
[im 48/144]
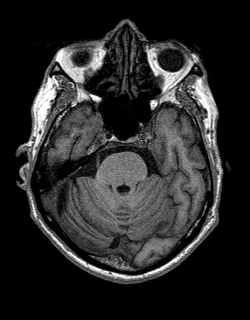
[im 64/144]
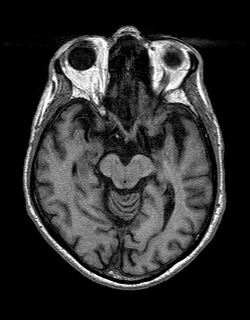
[im 80/144]
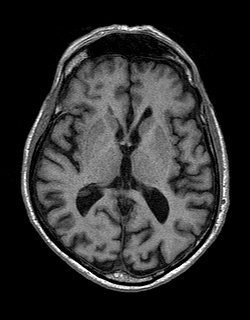
[im 96/144]
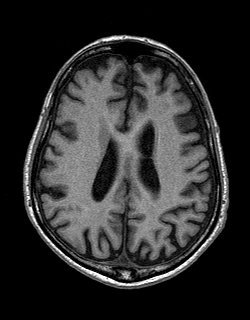
[im 112/144]
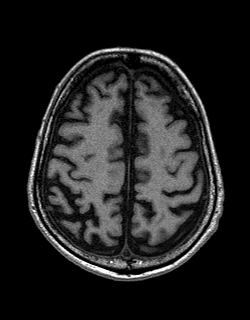
[im 128/144]
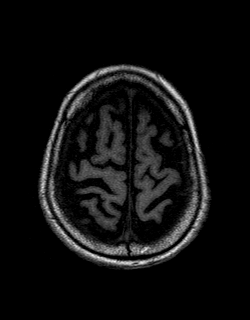
[im 144/144]
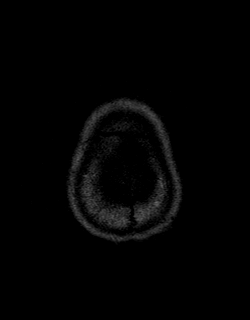

[Series 12: T2 · coronal · 5.0mm · 0.45mm/px · 2 of 27 slices shown (2 of 2)]
[im 1/27]
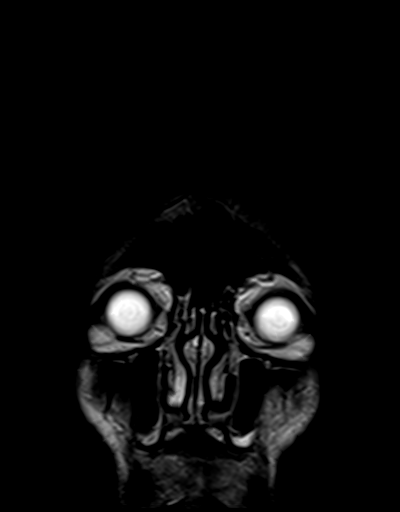
[im 27/27]
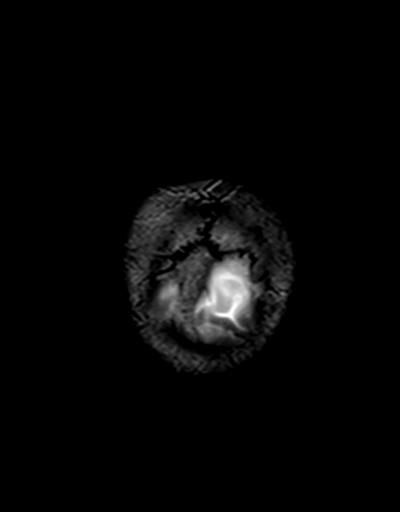

[Series 13: t1_mpr_tra post · axial · 1.0mm · 0.75mm/px · z∈[-85,+57]mm · 10 of 144 slices shown]
[im 1/144]
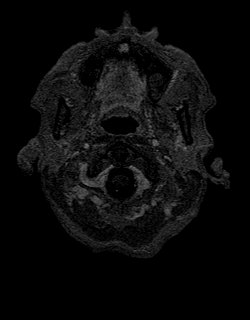
[im 16/144]
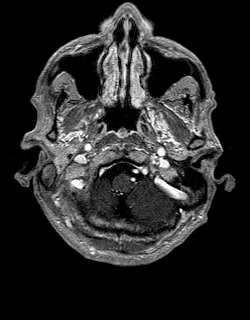
[im 32/144]
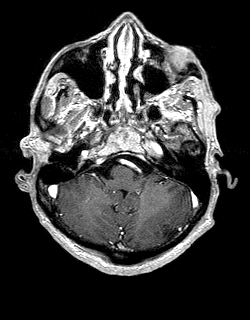
[im 48/144]
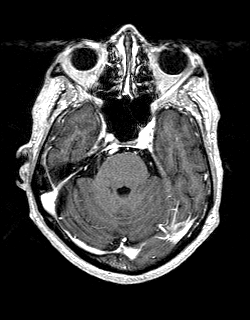
[im 64/144]
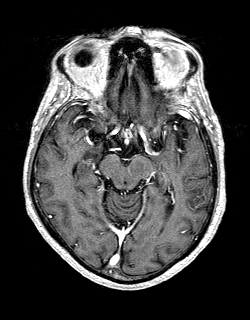
[im 80/144]
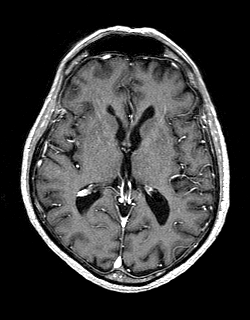
[im 96/144]
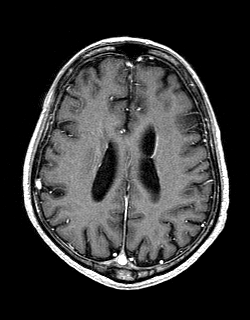
[im 112/144]
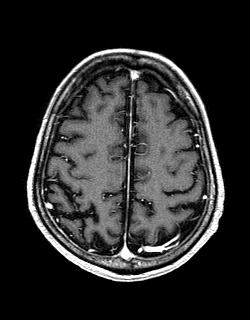
[im 128/144]
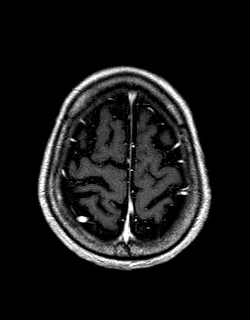
[im 144/144]
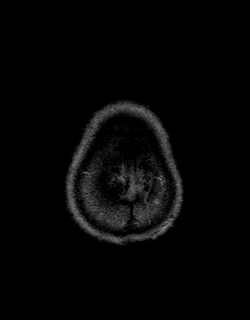

[Series 14: post cor · coronal · 5.0mm · 0.45mm/px · 2 of 27 slices shown]
[im 1/27]
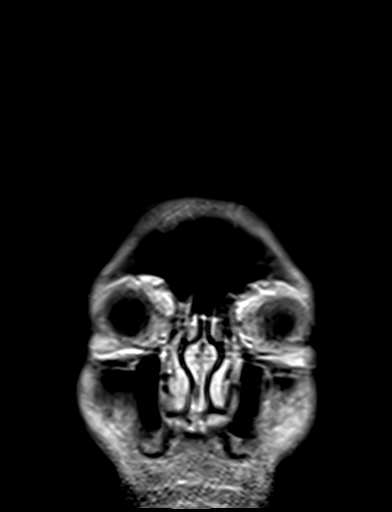
[im 27/27]
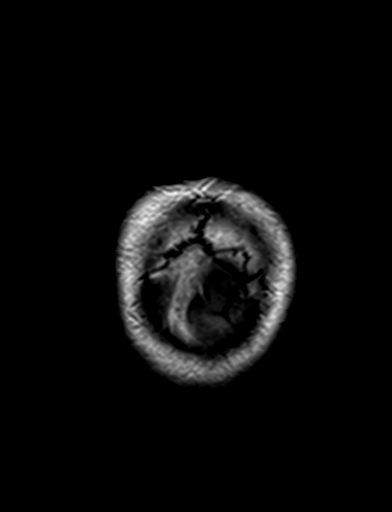

[48 of 48 positions shown; findings below may reference images not displayed]

FINDINGS: Brain:

Mild-to-moderate cerebral atrophy without appreciable lobar
predominance. Comparatively mild cerebellar atrophy.

Minimal ill-defined T2/FLAIR hyperintense signal within the
periatrial white matter bilaterally, nonspecific but compatible with
chronic small vessel ischemic disease.

Tiny chronic lacunar infarct within the right cerebellar hemisphere.

There is no acute infarct.

No evidence of intracranial mass.

No chronic intracranial blood products.

No extra-axial fluid collection.

No midline shift.

No abnormal intracranial enhancement.

Vascular: Expected proximal arterial flow voids.

Skull and upper cervical spine: No focal marrow lesion.

Sinuses/Orbits: Bilateral lens replacements. Visualized orbits show
no acute finding. Mild bilateral ethmoid sinus mucosal thickening.
17 mm mucous retention cyst and mild mucosal thickening within the
left maxillary sinus.

Other: Nonspecific 10 mm T1 and T2 intermediate signal round lesion
within the midline posterior nasopharynx. In retrospect, this
finding has been present on prior head CTs dating back to
02/21/2019.
IMPRESSION: No evidence of acute intracranial abnormality.

Mild-to-moderate generalized cerebral atrophy. Comparatively mild
cerebellar atrophy.

Minimal chronic small-vessel ischemic changes within the bilateral
periatrial white matter.

Tiny chronic lacunar infarct within the right cerebellar hemisphere.

Paranasal sinus disease, as described.

Nonspecific 10 mm round lesion within the midline posterior
nasopharynx. In retrospect, this finding has been present on prior
head CTs dating back to 02/21/2019. Consider ENT consultation/direct
visualization for further evaluation, as clinically warranted.

## 2021-08-19 ENCOUNTER — Ambulatory Visit: Payer: Self-pay

## 2021-08-19 NOTE — Chronic Care Management (AMB) (Signed)
? ?  RN Case Manager ?Care Management  ? Phone Outreach  ? ? ?08/19/2021 ?Name: [name] MRN: [id number] DOB: 1940/08/03 ? ?[name] is a 81 y.o. year old male who is a primary care patient of [name], DO .  ? ?CM RN I received a call from [facility], and she informed me that she went to her father's house today and she looked on his table, and there was a piece of mail from Social Service dated back in January stating that he was number 46 on the wait list for PCS and he had thirty days to respond. What should she do since time passed and she was unaware of the notice? I advise her to call his case worker [name] at [phone], explain the situation, let her know you have Guardianship, and show them the papers. She verbalized understanding.  I will also update [name], Education officer, museum. ? ? ?Follow Up Plan: CM RN will follow up with the patient at he next scheduled Interval.  ? ?Review of patient status, including review of consultants reports, relevant laboratory and other test results, and collaboration with appropriate care team members and the patient's provider was performed as part of comprehensive patient evaluation and provision of care management services.   ? ?[name] RN, BSN, [facility] ?Care Management Coordinator ?[place] ?Phone: [phone] Fax: [phone] ?  ? ? ? ? ? ? ? ? ? ? ? ?

## 2021-09-18 ENCOUNTER — Ambulatory Visit: Payer: Medicare PPO
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
## 2021-09-18 NOTE — Patient Instructions (Signed)
Visit Information ? ?[name]  it was nice speaking with you. Please call me directly [phone] if you have questions about the goals we discussed. ? ?Patient Goals/Self Care Activities: ?-Patient/Caregiver will self-administer medications as prescribed as evidenced by self-report/primary caregiver report  ?-Patient/Caregiver will attend all scheduled provider appointments as evidenced by clinician review of documented attendance to scheduled appointments and patient/caregiver report ?-Patient/Caregiver will call pharmacy for medication refills as evidenced by patient report and review of pharmacy fill history as appropriate ?-Patient/Caregiver will call provider office for new concerns or questions as evidenced by review of documented incoming telephone call notes and patient report ?-Patient/Caregiver verbalizes understanding of plan ?-Patient/Caregiver will focus on medication adherence by taking medications as prescribed ?-Follow CHF Action Plan ?-Adhere to low sodium diet ?-[name] will work with [name] regarding Medicaid card ?-Call and get an appointment with Physician  ?  ? ? ?The patient verbalized understanding of instructions, educational materials, and care plan provided today and agreed to receive a mailed copy of patient instructions, educational materials, and care plan.  ? ?Follow up Plan: Patient would like continued follow-up.  CCM RNCM will outreach the patient within the next 4 weeks.  Patient will call office if needed prior to next encounter ? ?[name] Arms, RN ? ?[phone]  ?

## 2021-09-21 NOTE — Progress Notes (Signed)
? ? ?  SUBJECTIVE:  ? ?CHIEF COMPLAINT / HPI:  ? ?Memory loss ?Seen in geriatric clinic on 10/09/2020, met criteria for dementia with behavioral disturbances.  ?Gets rides to grocery store and bank, does own shopping. Can bathe self, dress self, fix own food. Daughter states he is using depends more frequently recently.  ?Daughter states he didn't pay rent this past month but pt states he did. A similar situation happened last August. Daughter has guardianship of him but not his estate. She is worried about his finances and is working on getting guardianship of his estate. ?Daughter states her brother said pt could stay with him and pt agreed but pt then stated he wants to live on his own. She is trying to be respectful of his wishes but does think he should not be living independently.  ?Daughter states she will continue on adult daycare for patient and think that would be good for him.  Patient states he does not wish to go to an adult daycare, states he gets out of the house and goes shopping, to the bank, walks to the store daily and feels well. ? ?Pts daughter states she needs a form or order for his medicaid application but is unsure of exactly what she means. ? ?T2DM ?Last A1C of 5.6 on 08/11/2020. Pt currently takes jardiance 10 mg daily  ? ? ? ?PERTINENT  PMH / PSH: Dementia ? ?OBJECTIVE:  ? ?Vitals:  ? 09/22/21 1518  ?BP: 124/68  ?Pulse: (!) 59  ?SpO2: 98%  ? ? ? ?General: NAD, pleasant, able to participate in exam ?HEENT: White sclera, clear conjunctive a, unable to view bilateral TMs due to cerumen, MMM, edentulous ?Cardiac: RRR, no murmurs. ?Respiratory: CTAB, normal effort, No wheezes, rales or rhonchi ?Extremities: no edema or cyanosis. ?MSK: Able to move extremities independently and moves very carefully and cautiously when getting on exam table ?Skin: warm and dry, no rashes noted ?Neuro: alert, no obvious focal deficits ?Psych: Normal affect and mood ? ?ASSESSMENT/PLAN:  ? ?Dementia with behavioral  disturbance ?Per daughter dementia may be worsening however patient does appear to be able to perform ADLs and is taking his medications appropriately.  Weight is stable from last year. Daughter still trying to respect patient's wishes to live independently. ?-CCM will continue to follow, will try and find out what form needs to be signed or order placed ?-CTM ? ?T2DM ?A1c today of 6.2 ?-check A1c in 1 year ? ?HFrEF ?Pt takes Jardiance and Lasix ?-BMP  ? ? ? ?Dr. [name], DO ?[place]  ? ? ? ? ?

## 2021-09-22 ENCOUNTER — Ambulatory Visit (INDEPENDENT_AMBULATORY_CARE_PROVIDER_SITE_OTHER): Payer: Medicare PPO | Admitting: Student

## 2021-09-22 ENCOUNTER — Encounter: Payer: Self-pay | Admitting: Student

## 2021-09-22 VITALS — BP 124/68 | HR 59 | Wt 129.0 lb

## 2021-09-22 DIAGNOSIS — Z794 Long term (current) use of insulin: Secondary | ICD-10-CM

## 2021-09-22 DIAGNOSIS — F03918 Unspecified dementia, unspecified severity, with other behavioral disturbance: Secondary | ICD-10-CM

## 2021-09-22 DIAGNOSIS — I5022 Chronic systolic (congestive) heart failure: Secondary | ICD-10-CM | POA: Diagnosis not present

## 2021-09-22 DIAGNOSIS — E1165 Type 2 diabetes mellitus with hyperglycemia: Secondary | ICD-10-CM | POA: Diagnosis not present

## 2021-09-22 LAB — POCT GLYCOSYLATED HEMOGLOBIN (HGB A1C): HbA1c, POC (prediabetic range): 6.2 % (ref 5.7–6.4)

## 2021-09-22 NOTE — Patient Instructions (Signed)
It was great to see you! Thank you for allowing me to participate in your care! ? ?I recommend that you always bring your medications to each appointment as this makes it easy to ensure you are on the correct medications and helps [name] not miss when refills are needed. ? ?Our plans for today:  ?- Please contact Ms. Cheryll Cockayne for more information about what forms needs to be sign or what orders need to be placed. I will also look into this ? ?We are checking some labs today, I will call you if they are abnormal will send you a MyChart message or a letter if they are normal.  If you do not hear about your labs in the next 2 weeks please let us know. ? ?Take care and seek immediate care sooner if you develop any concerns.  ? ?Dr. [name], DO ?[facility] ? ?

## 2021-09-22 NOTE — Assessment & Plan Note (Signed)
Per daughter dementia may be worsening however patient does appear to be able to perform ADLs and is taking his medications appropriately.  Daughter still trying to respect patient's wishes to live independently. ?-CCM will continue to follow, will try and find out what form needs to be signed or order placed ?-CTM ?

## 2021-09-23 ENCOUNTER — Encounter: Payer: Self-pay | Admitting: Student

## 2021-09-23 LAB — BASIC METABOLIC PANEL
BUN/Creatinine Ratio: 13 (ref 10–24)
BUN: 20 mg/dL (ref 8–27)
CO2: 20 mmol/L (ref 20–29)
Calcium: 9.8 mg/dL (ref 8.6–10.2)
Chloride: 101 mmol/L (ref 96–106)
Creatinine, Ser: 1.57 mg/dL — ABNORMAL HIGH (ref 0.76–1.27)
Glucose: 82 mg/dL (ref 70–99)
Potassium: 4.8 mmol/L (ref 3.5–5.2)
Sodium: 136 mmol/L (ref 134–144)
eGFR: 44 mL/min/{1.73_m2} — ABNORMAL LOW (ref >59)

## 2021-09-23 NOTE — Progress Notes (Signed)
Letter sent with recent BMP results  ?

## 2021-09-27 ENCOUNTER — Other Ambulatory Visit: Payer: Self-pay | Admitting: Physician Assistant

## 2021-09-28 ENCOUNTER — Other Ambulatory Visit: Payer: Self-pay

## 2021-09-28 ENCOUNTER — Ambulatory Visit: Payer: Self-pay

## 2021-09-28 MED ORDER — ATORVASTATIN CALCIUM 80 MG PO TABS
80.0000 mg | ORAL_TABLET | Freq: Every day | ORAL | 0 refills | Status: DC
Start: 2021-09-28 — End: 2021-12-25

## 2021-09-28 MED ORDER — FUROSEMIDE 40 MG PO TABS
20.0000 mg | ORAL_TABLET | Freq: Every day | ORAL | 0 refills | Status: DC
Start: 2021-09-28 — End: 2021-10-01

## 2021-09-28 MED ORDER — EMPAGLIFLOZIN 10 MG PO TABS: 10.0000 mg | ORAL_TABLET | Freq: Every day | ORAL | 0 refills | Status: DC

## 2021-09-28 MED ORDER — METOPROLOL SUCCINATE ER 25 MG PO TB24: 25.0000 mg | ORAL_TABLET | Freq: Every day | ORAL | 0 refills | Status: DC

## 2021-09-28 NOTE — Chronic Care Management (AMB) (Signed)
? ?  RN Case Manager ?Care Management  ? Phone Outreach  ? ? ?09/28/2021 ?Name: [name] MRN: [id number] DOB: 1941-02-14 ? ?[name] is a 81 y.o. year old male who is a primary care patient of [name], DO .  ? ?RNCM received a call from [facility] today wanting me to inform my co-worker [name] that she will need an FL2 and to let her know that she will be in training, so the call that is scheduled Wednesday she will need to reschedule for Friday or next week.  I have sent [name] a message. ? ?Follow Up Plan: CM RN will follow up with the patient at he next scheduled Interval.  ? ?Review of patient status, including review of consultants reports, relevant laboratory and other test results, and collaboration with appropriate care team members and the patient's provider was performed as part of comprehensive patient evaluation and provision of care management services.   ? ?[name] RN, BSN, [facility] ?Care Management Coordinator ?[place] ?Phone: [phone] Fax: [phone] ?  ? ? ? ? ? ? ? ? ? ? ?

## 2021-09-30 ENCOUNTER — Other Ambulatory Visit: Payer: Self-pay | Admitting: Physician Assistant

## 2021-10-15 ENCOUNTER — Telehealth: Payer: Self-pay | Admitting: *Deleted

## 2021-10-15 NOTE — Chronic Care Management (AMB) (Signed)
?  Care Coordination ?Note ? ?10/15/2021 ?Name: Kasem Mozer MRN: [id number] DOB: 1941-06-05 ? ?Wilkin Lippy is a 81 y.o. year old male who is a primary care patient of [name], DO and is actively engaged with the care management team. I reached out to [name] by phone today to assist with re-scheduling a follow up visit with the RN Case Manager ? ?Follow up plan: ?Unsuccessful telephone outreach attempt made. A HIPAA compliant phone message was left for the patient providing contact information and requesting a return call.  ?The care management team will reach out to the patient again over the next 7 days.  ?If patient returns call to provider office, please advise to call [place] at [phone]. ? ?Laverda Sorenson  ?Care Guide, Embedded Care Coordination ?[place]  Care Management  ?Direct Dial: [phone] ? ?

## 2021-10-19 ENCOUNTER — Telehealth: Payer: Medicare PPO

## 2021-10-19 NOTE — Chronic Care Management (AMB) (Signed)
?  Care Coordination ?Note ? ?10/19/2021 ?Name: [name] MRN: [id number] DOB: 07/27/1940 ? ?Dj Senteno is a 81 y.o. year old male who is a primary care patient of [name], DO and is actively engaged with the care management team. I reached out to [name] by phone today to assist with re-scheduling a follow up visit with the RN Case Manager ? ?Follow up plan: ?Telephone appointment with care management team member scheduled for:10/27/21 ? ?Laverda Sorenson  ?Care Guide, Embedded Care Coordination ?[place]  Care Management  ?Direct Dial: [phone] ? ?

## 2021-10-27 ENCOUNTER — Ambulatory Visit: Payer: Medicare PPO

## 2021-10-27 NOTE — Patient Instructions (Signed)
Visit Information  Mr. Meno  it was nice speaking with you. Please call me directly [phone] if you have questions about the goals we discussed.    Patient Goals/Self Care Activities: -Patient/Caregiver will self-administer medications as prescribed as evidenced by self-report/primary caregiver report  -Patient/Caregiver will attend all scheduled provider appointments as evidenced by clinician review of documented attendance to scheduled appointments and patient/caregiver report -Patient/Caregiver will call pharmacy for medication refills as evidenced by patient report and review of pharmacy fill history as appropriate -Patient/Caregiver will call provider office for new concerns or questions as evidenced by review of documented incoming telephone call notes and patient report -Patient/Caregiver verbalizes understanding of plan -Patient/Caregiver will focus on medication adherence by taking medications as prescribed -Follow CHF Action Plan -Adhere to low sodium diet -[name] will work with [name] regarding Medicaid card     The patient verbalized understanding of instructions, educational materials, and care plan provided today and agreed to receive a mailed copy of patient instructions, educational materials, and care plan.   Follow up Plan: Patient would like continued follow-up.  CCM RNCM will outreach the patient within the next 5 mailss  Patient will call office if needed prior to next encounter  [name], RN  [phone]

## 2021-10-27 NOTE — Chronic Care Management (AMB) (Signed)
Chronic Care Management   CCM RN Visit Note  10/27/2021 Name: [name] MRN: [id number] DOB: 1940/07/12  Subjective: [name] is a 81 y.o. year old male who is a primary care patient of [name], DO. The care management team was consulted for assistance with disease management and care coordination needs.    Engaged with patient by telephone for follow up visit in response to provider referral for case management and/or care coordination services.   Consent to Services:  The patient was given information about Chronic Care Management services, agreed to services, and gave verbal consent prior to initiation of services.  Please see initial visit note for detailed documentation.   Patient agreed to services and verbal consent obtained.    Summary: Patient's daughter provided all information during this encounter. The patient continues to maintain positive progress with care plan goals. See Care Plan below for interventions and patient self-care actives.  Recommendation: The patient may benefit from taking medications as prescribed, exercising as tolerated, monitoring food intake, and [name] agrees.  Follow up Plan: Patient would like continued follow-up.  CCM RNCM will outreach the patient within the next 5 weeks.  Patient will call office if needed prior to next encounter   Assessment: Review of patient past medical history, allergies, medications, health status, including review of consultants reports, laboratory and other test data, was performed as part of comprehensive evaluation and provision of chronic care management services.   SDOH (Social Determinants of Health) assessments and interventions performed:  No  CCM Care Plan  Allergies  Allergen Reactions   Ace Inhibitors Swelling and Other (See Comments)    Site of swelling not known by the patient   Valsartan Swelling and Other (See Comments)    Site of swelling not known by the patient     Outpatient Encounter Medications as of 10/27/2021  Medication Sig   aspirin 81 MG chewable tablet Chew 1 tablet (81 mg total) by mouth daily.   atorvastatin (LIPITOR) 80 MG tablet Take 1 tablet (80 mg total) by mouth at bedtime. Make follow up appointment to receive further refills. Thank you. [phone]   empagliflozin (JARDIANCE) 10 MG TABS tablet Take 1 tablet (10 mg total) by mouth daily before breakfast. Make follow up appointment to receive further refills. Thank you. [phone]   furosemide (LASIX) 40 MG tablet Take 0.5 tablets (20 mg total) by mouth daily. Please make overdue appt with Dr. Marlou Porch for May 2023 for future refills. Thank you 1st attempt   metoprolol succinate (TOPROL-XL) 25 MG 24 hr tablet Take 1 tablet (25 mg total) by mouth daily. Make follow up appointment to receive further refills. Thank you. [phone]   No facility-administered encounter medications on file as of 10/27/2021.    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   Goiter (Multinodular) with tracheal deviation  06/08/2007   Hypertension associated with diabetes ([place]) 04/21/2007   Chronic urticaria 04/21/2007    Conditions to be addressed/monitored:CHF  Care Plan : RN Case Manager  Updates made by [name] Arms, RN since 10/27/2021 12:00 AM     Problem: (Heart Failure)   Priority: High  Onset Date: 10/10/2020     Long-Range Goal: Caregiver patient will learn signs and symptoms of HF through monitoring for early detecttion to Prevented or Minimize Exacerbation   Start Date: 10/10/2020  Expected End Date: 12/04/2021  Priority: High  Note:   Current Barriers:  Knowledge deficit related to basic heart failure pathophysiology and self care management Literacy Barriers- Daughter does not know a lot of information about  Heart Failure.  Case Manager Clinical Goal(s):  Caregiver/patient will verbalize understanding of Heart Failure Action Plan and when to call doctor  Interventions: 1:1 collaboration with primary care provider regarding development and update of comprehensive plan of care as evidenced by provider attestation and co-signature Inter-disciplinary care team collaboration (see longitudinal plan of care) Evaluation of current treatment plan related to  self management and patient's adherence to plan as established by provider   Heart Failure Interventions:  (Status: Goal on Track (progressing): YES.)  Long Term Goal  Wt Readings from Last 3 Encounters:  09/22/21 129 lb (58.5 kg)  11/05/20 129 lb (58.5 kg)  10/16/20 131 lb 3.2 oz (59.5 kg)  Collaboration with [name], DO regarding development and update of comprehensive plan of care as evidenced by provider attestation and co-signature Inter-disciplinary care team collaboration (see longitudinal plan of care) Discussed plans with patient for ongoing care management follow up and provided patient with direct contact information for care management team Review of patient status, including review of consultants reports, relevant  laboratory and other test results, and medications completed. 10/27/21:  This morning, I talked with [name] about her father's health. According to her, he is doing well and has not experienced any chest pain, shortness of breath, or swelling. [name]'s brother has been assisting with groceries while she has been keeping an eye on her father's medications. She plans to submit the necessary paperwork to the magistrate to obtain guardianship, allowing her to manage her father's money and business affairs. [place] requested my help verifying her father's FL2 form for Social Services, and I have reached out to my colleague [name] to assist the patient.   Patient Goals/Self Care Activities: -Patient/Caregiver will self-administer medications as prescribed as evidenced by self-report/primary caregiver report  -Patient/Caregiver will attend all scheduled provider appointments as evidenced by clinician review of documented attendance to scheduled appointments and patient/caregiver report -Patient/Caregiver will call pharmacy for medication refills as evidenced by patient report and review of pharmacy fill history as appropriate -Patient/Caregiver will call provider office for new concerns or questions as evidenced by review of documented incoming telephone call notes and patient report -Patient/Caregiver verbalizes understanding of plan -Patient/Caregiver will focus on medication adherence by taking medications as prescribed -Follow CHF Action Plan -Adhere to low sodium diet -[name] will work with [name] regarding Medicaid card     [name] Arms RN, BSN, [facility] Care Management Coordinator [facility]  Phone: [phone]

## 2021-11-10 ENCOUNTER — Ambulatory Visit: Payer: Medicare PPO | Admitting: Licensed Clinical Social Worker

## 2021-11-10 NOTE — Patient Instructions (Signed)
Visit Information  Instructions: patient will work with SW to address concerns related to Placement  Patient was given the following information about care management and care coordination services today, agreed to services, and gave verbal consent: 1.care management/care coordination services include personalized support from designated clinical staff supervised by their physician, including individualized plan of care and coordination with other care providers 2. 24/7 contact phone numbers for assistance for urgent and routine care needs. 3. The patient may stop care management/care coordination services at any time by phone call to the office staff.  Patient verbalizes understanding of instructions and care plan provided today and agrees to view in [place]. Active MyChart status and patient understanding of how to access instructions and care plan via MyChart confirmed with patient.     The care management team will reach out to the patient again over the next 30 days.   [name], MSW  Social Worker [facility] Care Management  [phone]

## 2021-11-10 NOTE — Chronic Care Management (AMB) (Signed)
  Care Management   Social Work Visit Note  11/10/2021 Name: [name] MRN: [id number] DOB: 1941/05/01  [name] is a 81 y.o. year old male who sees [name], DO for primary care. The care management team was consulted for assistance with care management and care coordination needs related to Level of Care Concerns     Engaged with patient by telephone for initial visit in response to provider referral for social work chronic care management and care coordination services.  Assessment: Review of patient history, allergies, and health status during evaluation of patient need for care management/care coordination services.    Interventions:  Patient interviewed and appropriate assessments performed Collaborated with clinical team regarding patient needs  Successful outreach to patients daughter- Ms. Wells on today. SW is working with Ms. Wells to identify placement. SW exchanged email and contact information.  SW will email Fl2 form to Ms. Wells once received back. Ms. Rock Nephew will contact patients insurance provider to inquire on placement benefits.       Plan:  SW will follow up in 30-days.  [name] , MSW Social Worker [facility] Care Management  [phone]

## 2021-11-17 ENCOUNTER — Inpatient Hospital Stay (HOSPITAL_COMMUNITY)
Admission: EM | Admit: 2021-11-17 | Discharge: 2021-11-18 | DRG: 683 | Disposition: A | Discharge status: Home / Self Care | Payer: Medicare PPO | Attending: Family Medicine | Admitting: Family Medicine

## 2021-11-17 ENCOUNTER — Encounter (HOSPITAL_COMMUNITY): Payer: Self-pay | Admitting: Emergency Medicine

## 2021-11-17 ENCOUNTER — Other Ambulatory Visit: Payer: Self-pay

## 2021-11-17 DIAGNOSIS — F03918 Unspecified dementia, unspecified severity, with other behavioral disturbance: Secondary | ICD-10-CM | POA: Diagnosis present

## 2021-11-17 DIAGNOSIS — N3001 Acute cystitis with hematuria: Secondary | ICD-10-CM | POA: Diagnosis present

## 2021-11-17 DIAGNOSIS — Z79899 Other long term (current) drug therapy: Secondary | ICD-10-CM | POA: Diagnosis not present

## 2021-11-17 DIAGNOSIS — E860 Dehydration: Secondary | ICD-10-CM

## 2021-11-17 DIAGNOSIS — N3000 Acute cystitis without hematuria: Secondary | ICD-10-CM | POA: Diagnosis not present

## 2021-11-17 DIAGNOSIS — I110 Hypertensive heart disease with heart failure: Secondary | ICD-10-CM | POA: Diagnosis present

## 2021-11-17 DIAGNOSIS — I5022 Chronic systolic (congestive) heart failure: Secondary | ICD-10-CM | POA: Diagnosis present

## 2021-11-17 DIAGNOSIS — E785 Hyperlipidemia, unspecified: Secondary | ICD-10-CM | POA: Diagnosis present

## 2021-11-17 DIAGNOSIS — R001 Bradycardia, unspecified: Secondary | ICD-10-CM | POA: Diagnosis not present

## 2021-11-17 DIAGNOSIS — Z888 Allergy status to other drugs, medicaments and biological substances status: Secondary | ICD-10-CM | POA: Diagnosis not present

## 2021-11-17 DIAGNOSIS — R319 Hematuria, unspecified: Secondary | ICD-10-CM | POA: Diagnosis not present

## 2021-11-17 DIAGNOSIS — F17210 Nicotine dependence, cigarettes, uncomplicated: Secondary | ICD-10-CM | POA: Diagnosis present

## 2021-11-17 DIAGNOSIS — E1149 Type 2 diabetes mellitus with other diabetic neurological complication: Secondary | ICD-10-CM | POA: Diagnosis present

## 2021-11-17 DIAGNOSIS — N390 Urinary tract infection, site not specified: Secondary | ICD-10-CM

## 2021-11-17 DIAGNOSIS — R9431 Abnormal electrocardiogram [ECG] [EKG]: Secondary | ICD-10-CM

## 2021-11-17 DIAGNOSIS — E119 Type 2 diabetes mellitus without complications: Secondary | ICD-10-CM | POA: Diagnosis present

## 2021-11-17 DIAGNOSIS — Z7984 Long term (current) use of oral hypoglycemic drugs: Secondary | ICD-10-CM | POA: Diagnosis not present

## 2021-11-17 DIAGNOSIS — N179 Acute kidney failure, unspecified: Secondary | ICD-10-CM | POA: Diagnosis present

## 2021-11-17 DIAGNOSIS — R531 Weakness: Secondary | ICD-10-CM | POA: Diagnosis not present

## 2021-11-17 DIAGNOSIS — Z7982 Long term (current) use of aspirin: Secondary | ICD-10-CM

## 2021-11-17 DIAGNOSIS — R310 Gross hematuria: Secondary | ICD-10-CM

## 2021-11-17 DIAGNOSIS — F0390 Unspecified dementia without behavioral disturbance: Secondary | ICD-10-CM | POA: Diagnosis present

## 2021-11-17 LAB — COMPREHENSIVE METABOLIC PANEL
ALT: 39 U/L (ref 0–44)
AST: 50 U/L — ABNORMAL HIGH (ref 15–41)
Albumin: 3.8 g/dL (ref 3.5–5.0)
Alkaline Phosphatase: 62 U/L (ref 38–126)
Anion gap: 11 (ref 5–15)
BUN: 25 mg/dL — ABNORMAL HIGH (ref 8–23)
CO2: 23 mmol/L (ref 22–32)
Calcium: 10.1 mg/dL (ref 8.9–10.3)
Chloride: 98 mmol/L (ref 98–111)
Creatinine, Ser: 2 mg/dL — ABNORMAL HIGH (ref 0.61–1.24)
GFR, Estimated: 33 mL/min — ABNORMAL LOW (ref >60)
Glucose, Bld: 100 mg/dL — ABNORMAL HIGH (ref 70–99)
Potassium: 4.3 mmol/L (ref 3.5–5.1)
Sodium: 132 mmol/L — ABNORMAL LOW (ref 135–145)
Total Bilirubin: 0.9 mg/dL (ref 0.3–1.2)
Total Protein: 8.2 g/dL — ABNORMAL HIGH (ref 6.5–8.1)

## 2021-11-17 LAB — URINALYSIS, ROUTINE W REFLEX MICROSCOPIC
Bilirubin Urine: NEGATIVE
Glucose, UA: 250 mg/dL — AB
Ketones, ur: NEGATIVE mg/dL
Nitrite: POSITIVE — AB
Protein, ur: NEGATIVE mg/dL
Specific Gravity, Urine: 1.01 (ref 1.005–1.030)
pH: 6 (ref 5.0–8.0)

## 2021-11-17 LAB — CBC WITH DIFFERENTIAL/PLATELET
Abs Immature Granulocytes: 0.01 10*3/uL (ref 0.00–0.07)
Basophils Absolute: 0.1 10*3/uL (ref 0.0–0.1)
Basophils Relative: 1 %
Eosinophils Absolute: 0.1 10*3/uL (ref 0.0–0.5)
Eosinophils Relative: 2 %
HCT: 38.3 % — ABNORMAL LOW (ref 39.0–52.0)
Hemoglobin: 12.4 g/dL — ABNORMAL LOW (ref 13.0–17.0)
Immature Granulocytes: 0 %
Lymphocytes Relative: 31 %
Lymphs Abs: 1.6 10*3/uL (ref 0.7–4.0)
MCH: 31.2 pg (ref 26.0–34.0)
MCHC: 32.4 g/dL (ref 30.0–36.0)
MCV: 96.5 fL (ref 80.0–100.0)
Monocytes Absolute: 0.9 10*3/uL (ref 0.1–1.0)
Monocytes Relative: 17 %
Neutro Abs: 2.6 10*3/uL (ref 1.7–7.7)
Neutrophils Relative %: 49 %
Platelets: 244 10*3/uL (ref 150–400)
RBC: 3.97 MIL/uL — ABNORMAL LOW (ref 4.22–5.81)
RDW: 12.6 % (ref 11.5–15.5)
WBC: 5.3 10*3/uL (ref 4.0–10.5)
nRBC: 0 % (ref 0.0–0.2)

## 2021-11-17 LAB — URINALYSIS, MICROSCOPIC (REFLEX): WBC, UA: >50 WBC/hpf (ref 0–5)

## 2021-11-17 LAB — CBG MONITORING, ED: Glucose-Capillary: 107 mg/dL — ABNORMAL HIGH (ref 70–99)

## 2021-11-17 LAB — TROPONIN I (HIGH SENSITIVITY)
Troponin I (High Sensitivity): 18 ng/L — ABNORMAL HIGH (ref <18)
Troponin I (High Sensitivity): 14 ng/L (ref <18)

## 2021-11-17 MED ORDER — SODIUM CHLORIDE 0.9 % IV SOLN
1.0000 g | Freq: Once | INTRAVENOUS | Status: AC
  Administered 2021-11-17: 1 g via INTRAVENOUS
  Filled 2021-11-17: qty 10

## 2021-11-17 MED ORDER — SODIUM CHLORIDE 0.9 % IV BOLUS
500.0000 mL | Freq: Once | INTRAVENOUS | Status: AC
  Administered 2021-11-17: 500 mL via INTRAVENOUS

## 2021-11-17 MED ORDER — FUROSEMIDE 20 MG PO TABS: 20.0000 mg | ORAL_TABLET | Freq: Every day | ORAL | Status: DC

## 2021-11-17 MED ORDER — SODIUM CHLORIDE 0.9 % IV SOLN: INTRAVENOUS | Status: DC

## 2021-11-17 MED ORDER — ENOXAPARIN SODIUM 30 MG/0.3ML IJ SOSY
30.0000 mg | PREFILLED_SYRINGE | INTRAMUSCULAR | Status: DC
  Administered 2021-11-17: 30 mg via SUBCUTANEOUS
  Filled 2021-11-17: qty 0.3

## 2021-11-17 MED ORDER — ASPIRIN 81 MG PO CHEW
81.0000 mg | CHEWABLE_TABLET | Freq: Every day | ORAL | Status: DC
  Administered 2021-11-18: 81 mg via ORAL
  Filled 2021-11-17: qty 1

## 2021-11-17 MED ORDER — ATORVASTATIN CALCIUM 80 MG PO TABS
80.0000 mg | ORAL_TABLET | Freq: Every morning | ORAL | Status: DC
  Administered 2021-11-18: 80 mg via ORAL
  Filled 2021-11-17: qty 1

## 2021-11-17 MED ORDER — METOPROLOL SUCCINATE ER 25 MG PO TB24
25.0000 mg | ORAL_TABLET | Freq: Every day | ORAL | Status: DC
  Administered 2021-11-18: 25 mg via ORAL
  Filled 2021-11-17: qty 1

## 2021-11-17 NOTE — ED Provider Notes (Signed)
[facility] EMERGENCY DEPARTMENT Provider Note   CSN: [id number] Arrival date & time: 11/17/21  0919     History  Chief Complaint  Patient presents with   Weakness    [name] is a 81 y.o. male.  Patient brought in by EMS.  Looks like patient brought in around 930 this morning.  EMS stated he lives at home alone.  Patient had contacted them for feeling sick but when they arrived no further explanation.  His blood sugar was 105.  Patient known to have a history of dementia.  Patient is followed by family practice here.  And also followed by sort of case management through the family practice center.  Patient past medical history is significant for hypertension hyperlipidemia thyroid disorder history of congestive heart failure cognitive decline.  Past surgical history significant for gallbladder removal.  Patient still an everyday smoker.  Reading through the chart notes it sounds as if patient is still making his own medical decisions but the daughter was starting to realize that that may not be able to continue into the new future.       Home Medications Prior to Admission medications   Medication Sig Start Date End Date Taking? Authorizing Provider  aspirin 81 MG chewable tablet Chew 1 tablet (81 mg total) by mouth daily. 08/28/20  Yes Paige, Victoria J, DO  atorvastatin (LIPITOR) 80 MG tablet Take 1 tablet (80 mg total) by mouth at bedtime. Make follow up appointment to receive further refills. Thank you. [phone] Patient taking differently: Take 80 mg by mouth in the morning. 09/28/21  Yes Jerline Pain, MD  empagliflozin (JARDIANCE) 10 MG TABS tablet Take 1 tablet (10 mg total) by mouth daily before breakfast. Make follow up appointment to receive further refills. Thank you. [phone] 09/28/21  Yes Jerline Pain, MD  furosemide (LASIX) 40 MG tablet Take 0.5 tablets (20 mg total) by mouth daily. Please make overdue appt with Dr. Marlou Porch for May 2023 for  future refills. Thank you 1st attempt Patient taking differently: Take 20 mg by mouth daily. 10/01/21  Yes Jerline Pain, MD  metoprolol succinate (TOPROL-XL) 25 MG 24 hr tablet Take 1 tablet (25 mg total) by mouth daily. Make follow up appointment to receive further refills. Thank you. [phone] Patient taking differently: Take 25 mg by mouth daily. 09/28/21  Yes Jerline Pain, MD      Allergies    Ace inhibitors and Valsartan    Review of Systems   Review of Systems  Unable to perform ROS: Dementia    Physical Exam Updated Vital Signs BP 125/68 (BP Location: Right Arm)   Pulse (!) 58   Temp 98.4 F (36.9 C) (Oral)   Resp 16   SpO2 97%  Physical Exam Vitals and nursing note reviewed.  Constitutional:      General: He is not in acute distress.    Appearance: Normal appearance. He is well-developed.  HENT:     Head: Normocephalic and atraumatic.     Mouth/Throat:     Mouth: Mucous membranes are dry.     Comments: Mucous membranes dry Eyes:     Conjunctiva/sclera: Conjunctivae normal.     Pupils: Pupils are equal, round, and reactive to light.  Cardiovascular:     Rate and Rhythm: Normal rate and regular rhythm.     Heart sounds: No murmur heard. Pulmonary:     Effort: Pulmonary effort is normal. No respiratory distress.     Breath  sounds: Normal breath sounds. No wheezing, rhonchi or rales.  Abdominal:     General: There is no distension.     Palpations: Abdomen is soft.     Tenderness: There is no abdominal tenderness. There is no guarding.  Musculoskeletal:        General: No swelling.     Cervical back: Normal range of motion and neck supple.  Skin:    General: Skin is warm and dry.     Capillary Refill: Capillary refill takes less than 2 seconds.  Neurological:     General: No focal deficit present.     Mental Status: He is alert.     Cranial Nerves: No cranial nerve deficit.     Motor: No weakness.     Coordination: Coordination normal.     Comments:  Oriented to person.  But definitely has confusion.  Psychiatric:        Mood and Affect: Mood normal.     ED Results / Procedures / Treatments   Labs (all labs ordered are listed, but only abnormal results are displayed) Labs Reviewed  CBC WITH DIFFERENTIAL/PLATELET - Abnormal; Notable for the following components:      Result Value   RBC 3.97 (*)    Hemoglobin 12.4 (*)    HCT 38.3 (*)    All other components within normal limits  COMPREHENSIVE METABOLIC PANEL - Abnormal; Notable for the following components:   Sodium 132 (*)    Glucose, Bld 100 (*)    BUN 25 (*)    Creatinine, Ser 2.00 (*)    Total Protein 8.2 (*)    AST 50 (*)    GFR, Estimated 33 (*)    All other components within normal limits  URINALYSIS, ROUTINE W REFLEX MICROSCOPIC - Abnormal; Notable for the following components:   Glucose, UA 250 (*)    Hgb urine dipstick LARGE (*)    Nitrite POSITIVE (*)    Leukocytes,Ua MODERATE (*)    All other components within normal limits  URINALYSIS, MICROSCOPIC (REFLEX) - Abnormal; Notable for the following components:   Bacteria, UA MANY (*)    All other components within normal limits  CBG MONITORING, ED - Abnormal; Notable for the following components:   Glucose-Capillary 107 (*)    All other components within normal limits  URINE CULTURE    EKG None  Radiology No results found.  Procedures Procedures    Medications Ordered in ED Medications  0.9 %  sodium chloride infusion (has no administration in time range)  sodium chloride 0.9 % bolus 500 mL (has no administration in time range)  cefTRIAXone (ROCEPHIN) 1 g in sodium chloride 0.9 % 100 mL IVPB (has no administration in time range)    ED Course/ Medical Decision Making/ A&P                           Medical Decision Making Risk Prescription drug management. Decision regarding hospitalization.  Patient will be seen by family medicine.  For admission.  Patient's urine consistent with urinary tract  infection urine culture sent and patient treated here with 1 g of Rocephin.  Kidney function is markedly changed from April.  With a GFR now of 33 consistent with acute kidney injury and goes along with him appearing dehydrated.  We will give IV fluids we will give normal saline for maintenance fluids.  Labs otherwise CBC normal complete metabolic panel other than the GFR of 33 without  significant changes on sodium being down a little bit at 132 glucose 100.  Family medicine will see and admit.  Final Clinical Impression(s) / ED Diagnoses Final diagnoses:  Acute cystitis without hematuria  AKI (acute kidney injury) [facility])    Rx / DC Orders ED Discharge Orders     None         Fredia Sorrow, MD 11/17/21 1559

## 2021-11-17 NOTE — Assessment & Plan Note (Signed)
Signs of dementia during encounter.  Patient is awake and alert but poorly oriented to time.  Redirectable and able to have conversation without altered mental status from his baseline.  We will pursue methods for long-term placement as requested by daughter who has guardianship. -TOC for long-term placement

## 2021-11-17 NOTE — H&P (Cosign Needed Addendum)
Hospital Admission History and Physical Service Pager: [phone]  Patient name: [name] Medical record number: [id number] Date of Birth: 05-Apr-1941 Age: 81 y.o. Gender: male  Primary Care Provider: [name], DO Consultants: None Code Status: Full Preferred Emergency Contact:  Contact Information     Name Relation Home Work Stoughton, Jacobus Daughter   [phone]   Zaden, Sako   [phone]      Chief Complaint: Feeling sick  Assessment and Plan: [name] is a 81 y.o. male presenting with UTI and AKI 2/2 dehydration.   * Acute kidney injury ([name]) Suspect to be prerenal secondary to dehydration.  Creatinine of 2.0 and GFR 33 with baseline about 1.4.  Received 500 mL NS bolus in the ED. -Continue 148m/hr mIVF NS -Hold home Lasix  T wave inversion in EKG Noted in the inferior leads.  Asymptomatic, denying chest pain. - trend troponin  UTI (urinary tract infection) Asymptomatic.  Urinalysis with WBC greater than 50 and many bacteria, UA notable for positive nitrites, moderate leukocytes, large hemoglobin and glucose urea of 250.  Status post Rocephin 1 g IV.  Considered to be complicated UTI in male population. -Follow-up urine culture  Dementia with behavioral disturbance ([place] Signs of dementia during encounter.  Patient is awake and alert but poorly oriented to time.  Redirectable and able to have conversation without altered mental status from his baseline.  We will pursue methods for long-term placement as requested by daughter who has guardianship. -TOC for long-term placement  Type 2 diabetes mellitus with neurological complications ([place] 4/18 of 6.2.  Home medications include Jardiance 10 mg daily.  Will hold in the setting of UTI and glucosuria of 250.  Given well-controlled, will follow glucose on daily BMP and change plan as necessary -Hold home Jardiance -Follow glucose on BMP  Other problems chronic and  stable: HFrEF-continue metoprolol succinate, ASA.  Holding furosemide while on IV fluids HLD-continue statin  FEN/GI: Regular diet VTE Prophylaxis: Lovenox adjusted for creatinine clearance less than 30  Disposition: pending, possibly home 6/14  History of Present Illness:  HRafeal Skibickiis a 81y.o. male presenting with a chief complaint of feeling sick and when she called EMS.  Upon arrival, patient denied any symptoms which may have been related to his baseline dementia.  Patient denies any concerns or feeling sick.  He states he called EMS last week, not today.  Believes today to be October in the year 2000 but [place]  Denies fever, chest pain, difficulty breathing, abdominal pain, pain anywhere, difficulty urinating, constipation/diarrhea.  Patient states he does not take any medications.  He smokes 2 to 3 cigarettes a day.  Does not have any further complaints at this time.  He presents alone in the ED.  Discussed with patient's daughter SSoundra Pilonhe states she was aware that her father is in the ED.  Chart review and discussion with daughter notes that patient lives independently as she has been doing her best to grant him the lifestyle he desires although as his dementia has progressed, she has been searching for a way to get him to a more permanent living situation in which she can receive the help that he needs.  She does have a medication delivery system that allows him to get his medications daily and notes that he does take his medications.  She has guardianship but not healthcare power of attorney.  States  he is unable to cook for himself or perform his financial duties as he has missed rent several times in the past.  He is able to dress himself.  In the ED, patient did not have any complaints or explanations about "feeling sick".  Work-up showed elevated creatinine notable for AKI, and UTI with many bacteria and positive nitrite and  leukocytes.  Patient appears to be dry but not in greatly altered mental status compared to suspected baseline demented state.  No concern for stroke or further neurological disease thereby no imaging performed.  Review Of Systems: Per HPI  Pertinent Past Medical History: T2DM HFrEF HTN HLD Remainder reviewed in history tab.   Pertinent Past Surgical History: Adenoidectomy, cholecystectomy, right hemicolectomy, tonsillectomy Remainder reviewed in history tab.  Pertinent Social History: Tobacco use: Yes -2 to 3 cigarettes/day (per patient) Alcohol use: No Other Substance use: No Lives alone  Pertinent Family History: Dementia in family members Remainder reviewed in history tab.   Important Outpatient Medications: Jardiance, aspirin, metoprolol, Lasix, Lipitor Remainder reviewed in medication history.   Objective: BP 125/68 (BP Location: Right Arm)   Pulse (!) 58   Temp 98.4 F (36.9 C) (Oral)   Resp 16   SpO2 97%  Exam: General: Awake, conversational, poorly oriented to time, NAD Eyes: EOMI ENTM: Dry mucous membranes Neck: Normal ROM Cardiovascular: RRR, no murmurs auscultated Respiratory: CTA B, normal WOB Gastrointestinal: Soft, nontender, nondistended, normoactive bowel sounds Extremities: Extremities WWP. Moves all extremities equally. MSK: Normal bulk and tone, no CVA tenderness Neuro: Appropriately responsive to stimuli. No gross deficits appreciated.  Skin: No rashes or lesions appreciated.   Labs:  CBC BMET  Recent Labs  Lab 11/17/21 0950  WBC 5.3  HGB 12.4*  HCT 38.3*  PLT 244   Recent Labs  Lab 11/17/21 0950  NA 132*  K 4.3  CL 98  CO2 23  BUN 25*  CREATININE 2.00*  GLUCOSE 100*  CALCIUM 10.1     Urinalysis    Component Value Date/Time   COLORURINE YELLOW 11/17/2021 0950   APPEARANCEUR CLEAR 11/17/2021 0950   LABSPEC 1.010 11/17/2021 0950   PHURINE 6.0 11/17/2021 0950   GLUCOSEU 250 (A) 11/17/2021 0950   HGBUR LARGE (A) 11/17/2021  0950   BILIRUBINUR NEGATIVE 11/17/2021 0950   KETONESUR NEGATIVE 11/17/2021 0950   PROTEINUR NEGATIVE 11/17/2021 0950   UROBILINOGEN 0.2 01/30/2010 1420   NITRITE POSITIVE (A) 11/17/2021 0950   LEUKOCYTESUR MODERATE (A) 11/17/2021 0950  Bacteria: Many WBC: >50   EKG: Sinus bradycardia with PVCs, inverted T waves in leads II, III, aVF considerable for inferior infarct, high voltage QRS in leads V3-V6 notable for LVH  Imaging Studies Performed: No imaging results.  Wells Guiles, DO 11/17/2021, 5:55 PM PGY-1, [place] Intern pager: [phone], text pages welcome Secure chat group [place]   I was personally present and performed or re-performed the history, physical exam and medical decision making activities of this service and have verified that the service and findings are accurately documented in the resident's note.  Zola Button, MD                  11/17/2021, 5:58 PM

## 2021-11-17 NOTE — Assessment & Plan Note (Addendum)
Suspect to be prerenal secondary to dehydration.  Creatinine of 2.0 and GFR 33 with baseline about 1.4.  Received 500 mL NS bolus in the ED. -Continue 168m/hr mIVF NS -Hold home Lasix

## 2021-11-17 NOTE — Assessment & Plan Note (Addendum)
A1c on 4/18 of 6.2.  Home medications include Jardiance 10 mg daily. Continue to hold in the setting of UTI. Glucose 95 this AM. Given well-controlled, will follow glucose on daily BMP and change plan as necessary -Hold home Jardiance -Follow glucose on BMP

## 2021-11-17 NOTE — ED Triage Notes (Signed)
Pt arrives via EMS for "feeling sick" no further explanation. Denies abd pain, cp, sob. EMS reports pt might have signs of dementia- states the year is [birth year] (when he was born). Pt lives at home alone. CBG 105, BP 128/86, HR 72

## 2021-11-17 NOTE — ED Notes (Signed)
Attempted to obtain EKG pt continues to state he doesn't need one .

## 2021-11-17 NOTE — Assessment & Plan Note (Signed)
Asymptomatic.  Urinalysis with WBC greater than 50 and many bacteria, UA notable for positive nitrites, moderate leukocytes, large hemoglobin and glucose urea of 250.  Status post Rocephin 1 g IV.  Considered to be complicated UTI in male population. -Follow-up urine culture

## 2021-11-17 NOTE — ED Notes (Signed)
Pt unsteady on feet at this time - pt able to stand at edge of bed with assistance and use urinal

## 2021-11-17 NOTE — ED Provider Triage Note (Addendum)
Emergency Medicine Provider Triage Evaluation Note  [name] , a 81 y.o. male  was evaluated in triage.  Patient says he believes he is sick.  When asked what is wrong he says "I do not know."  I asked if he had any pain and he said no.  He says he just feels weak.  He believes it is either 1941 or [birth year]  I spoke with patient's daughter Chantel who reports that he does have a history of memory impairment.  He does not have any formal dementia diagnosis.  He lives alone and she is trying to find a way to have him placed because he is declining.  She reports a history of "heart problems.  Per chart review he has type 2 diabetes, hyperlipidemia, hypertension and history of elevated troponins.  Review of Systems  Positive:  Negative:   Physical Exam  BP 138/79 (BP Location: Right Arm)   Pulse 73   Temp 98.4 F (36.9 C) (Oral)   Resp 16   SpO2 98%  Gen:   Awake, no distress   Resp:  Normal effort  MSK:   Moves extremities without difficulty  Other:  RRR, lung sounds clear, resting comfortably with stable vital signs.  Ambulated to the restroom.  Medical Decision Making  Medically screening exam initiated at 9:47 AM.  Appropriate orders placed.  Syaire Saber was informed that the remainder of the evaluation will be completed by another provider, this initial triage assessment does not replace that evaluation, and the importance of remaining in the ED until their evaluation is complete.    Ordered labs and UA however he likely needs social work assistance  Daughter [name]: [phone]   Darliss Ridgel 11/17/21 5573   Patient had an episode of urinary incontinence in triage area.  Pads were supplied.  Upon lab evaluation, he does have a urinary tract infection.  Creatinine also bumped from labs a month ago   Rhae Hammock, PA-C 11/17/21 1150

## 2021-11-17 NOTE — Assessment & Plan Note (Signed)
Noted in the inferior leads.  Asymptomatic, denying chest pain. - trend troponin

## 2021-11-18 ENCOUNTER — Encounter (HOSPITAL_COMMUNITY): Payer: Self-pay | Admitting: Student

## 2021-11-18 ENCOUNTER — Inpatient Hospital Stay (HOSPITAL_COMMUNITY): Payer: Medicare PPO

## 2021-11-18 ENCOUNTER — Other Ambulatory Visit (HOSPITAL_COMMUNITY): Payer: Self-pay

## 2021-11-18 DIAGNOSIS — Z7982 Long term (current) use of aspirin: Secondary | ICD-10-CM | POA: Diagnosis not present

## 2021-11-18 DIAGNOSIS — E860 Dehydration: Secondary | ICD-10-CM

## 2021-11-18 DIAGNOSIS — N390 Urinary tract infection, site not specified: Secondary | ICD-10-CM | POA: Diagnosis not present

## 2021-11-18 DIAGNOSIS — Z9189 Other specified personal risk factors, not elsewhere classified: Secondary | ICD-10-CM | POA: Diagnosis not present

## 2021-11-18 DIAGNOSIS — Z7984 Long term (current) use of oral hypoglycemic drugs: Secondary | ICD-10-CM | POA: Diagnosis not present

## 2021-11-18 DIAGNOSIS — Z79899 Other long term (current) drug therapy: Secondary | ICD-10-CM | POA: Diagnosis not present

## 2021-11-18 DIAGNOSIS — E1149 Type 2 diabetes mellitus with other diabetic neurological complication: Secondary | ICD-10-CM

## 2021-11-18 DIAGNOSIS — F17210 Nicotine dependence, cigarettes, uncomplicated: Secondary | ICD-10-CM | POA: Diagnosis present

## 2021-11-18 DIAGNOSIS — N3001 Acute cystitis with hematuria: Secondary | ICD-10-CM | POA: Diagnosis present

## 2021-11-18 DIAGNOSIS — R310 Gross hematuria: Secondary | ICD-10-CM

## 2021-11-18 DIAGNOSIS — E785 Hyperlipidemia, unspecified: Secondary | ICD-10-CM | POA: Diagnosis present

## 2021-11-18 DIAGNOSIS — F03918 Unspecified dementia, unspecified severity, with other behavioral disturbance: Secondary | ICD-10-CM | POA: Diagnosis present

## 2021-11-18 DIAGNOSIS — N179 Acute kidney failure, unspecified: Secondary | ICD-10-CM | POA: Diagnosis present

## 2021-11-18 DIAGNOSIS — I110 Hypertensive heart disease with heart failure: Secondary | ICD-10-CM | POA: Diagnosis present

## 2021-11-18 DIAGNOSIS — I5022 Chronic systolic (congestive) heart failure: Secondary | ICD-10-CM | POA: Diagnosis present

## 2021-11-18 DIAGNOSIS — Z888 Allergy status to other drugs, medicaments and biological substances status: Secondary | ICD-10-CM | POA: Diagnosis not present

## 2021-11-18 LAB — BASIC METABOLIC PANEL
Anion gap: 10 (ref 5–15)
BUN: 24 mg/dL — ABNORMAL HIGH (ref 8–23)
CO2: 22 mmol/L (ref 22–32)
Calcium: 9.2 mg/dL (ref 8.9–10.3)
Chloride: 103 mmol/L (ref 98–111)
Creatinine, Ser: 1.55 mg/dL — ABNORMAL HIGH (ref 0.61–1.24)
GFR, Estimated: 44 mL/min — ABNORMAL LOW (ref >60)
Glucose, Bld: 95 mg/dL (ref 70–99)
Potassium: 4.1 mmol/L (ref 3.5–5.1)
Sodium: 135 mmol/L (ref 135–145)

## 2021-11-18 LAB — CBC
HCT: 35.3 % — ABNORMAL LOW (ref 39.0–52.0)
Hemoglobin: 11.2 g/dL — ABNORMAL LOW (ref 13.0–17.0)
MCH: 30.9 pg (ref 26.0–34.0)
MCHC: 31.7 g/dL (ref 30.0–36.0)
MCV: 97.5 fL (ref 80.0–100.0)
Platelets: 194 10*3/uL (ref 150–400)
RBC: 3.62 MIL/uL — ABNORMAL LOW (ref 4.22–5.81)
RDW: 12.8 % (ref 11.5–15.5)
WBC: 4.5 10*3/uL (ref 4.0–10.5)
nRBC: 0 % (ref 0.0–0.2)

## 2021-11-18 LAB — URINE CULTURE

## 2021-11-18 MED ORDER — CEPHALEXIN 500 MG PO CAPS
500.0000 mg | ORAL_CAPSULE | Freq: Two times a day (BID) | ORAL | 0 refills | Status: AC
  Filled 2021-11-18: qty 12, 6d supply, fill #0

## 2021-11-18 MED ORDER — CEPHALEXIN 250 MG PO CAPS
500.0000 mg | ORAL_CAPSULE | Freq: Two times a day (BID) | ORAL | Status: DC
Start: 2021-11-18 — End: 2021-11-19
  Administered 2021-11-18: 500 mg via ORAL
  Filled 2021-11-18: qty 2

## 2021-11-18 NOTE — ED Notes (Signed)
Patient's daughter called and made aware that he is ready for d/c.  Daughter will be here for patient shortly

## 2021-11-18 NOTE — ED Notes (Signed)
Safety sitter is now off handoff to on coming RN has been given

## 2021-11-18 NOTE — ED Notes (Signed)
Notified daughter that an order for d/c was placed and secure chat to verify with attending that is pt. Is being d/c will relay to next rn and notify the daughter

## 2021-11-18 NOTE — Hospital Course (Addendum)
Rieley Hausman is a 81 y.o.male with a history of dementia, T2DM, HFrEF, HLD who was admitted to the [facility] Service at [facility] for UTI and AKI 2/2 dehydration. His hospital course is detailed below:  UTI Presented asymptomatic with UA of WBC greater than 50 and many bacteria notable only positive for nitrites and moderate leukocytes.  His home Jardiance was held during hospitalization. Received Rocephin in ED and discharged with Keflex 500 mg twice daily for total of 7-day treatment.  Dehydration/AKI Creatinine of 2.0 GFR 33 with baseline creatinine about 1.4.  Received 500 mL NS bolus and further IVF at maintenance rate until discharge.  His home Lasix was held during hospitalization.  Creatinine improved to 1.55 upon day of discharge.  Gross hematuria UA with large hemoglobin and on second day of hospitalization urinating blood clots.  Not noticed by patient given demented state.  Renal/bladder ultrasound noted difficult to exclude irregular bladder wall thickening and advised nonemergent CT abdomen/pelvis with/without contrast. Advised to follow-up outpatient with urology.  Other chronic conditions were medically managed with home medications and formulary alternatives as necessary (HFrEF, HLD)  PCP Follow-up Recommendations: Ensure urology outpatient follow-up Consider restarting Lasix Restart Jardiance after completion of antibiotics for UTI Assistance for ALF memory care placement via community care management/social work referral

## 2021-11-18 NOTE — Progress Notes (Signed)
FPTS Brief Progress Note  S:Went bedside to see patient. Patient sleeping, did not disturb   O: BP (!) 108/57 (BP Location: Right Arm)   Pulse (!) 59   Temp 98.4 F (36.9 C) (Oral)   Resp 18   SpO2 98%     A/P: - Plans per day team - Orders reviewed. Labs for AM ordered, which was adjusted as needed.   Holley Bouche, MD 11/18/2021, 3:50 AM PGY-1, [facility] Night Resident  Please page [phone] with questions.

## 2021-11-18 NOTE — ED Notes (Signed)
Pt was ambulating with PT went to urinate with PT and starting passing moderate size blood clots denies pain

## 2021-11-18 NOTE — ED Notes (Signed)
Another regular diet was ordered for patient, for breakfast.

## 2021-11-18 NOTE — Discharge Summary (Signed)
[facility] Discharge Summary  Patient name: [name] Medical record number: [id number] Date of birth: 04-Sep-1940 Age: 81 y.o. Gender: male Date of Admission: 11/17/2021  Date of Discharge: 11/18/2021 Admitting Physician: Blane Ohara McDiarmid, MD  Primary Care Provider: [name], DO Consultants: None  Indication for Hospitalization: Dehydration, AKI  Discharge Diagnoses/Problem List:  Principal Problem:   AKI (acute kidney injury) [facility]) Active Problems:   Type 2 diabetes mellitus with neurological complications ([place])   Dementia with behavioral disturbance ([place])   Dehydration   UTI (urinary tract infection)   T wave inversion in EKG    Disposition: Home  Discharge Condition: Stable  Discharge Exam:  Blood pressure (!) 131/95, pulse 64, temperature 97.8 F (36.6 C), temperature source Oral, resp. rate 20, height '6\' 2"'$  (1.88 m), SpO2 97 %.  General: Awake, conversational, poorly oriented to time, NAD CV: RRR, no murmurs auscultated Respiratory: CTA B, normal WOB Abdomen: Soft, nontender, nondistended, normoactive bowel sounds  Brief Hospital Course:  [name] is a 81 y.o.male with a history of dementia, T2DM, HFrEF, HLD who was admitted to the [facility] Service at [facility] for UTI and AKI 2/2 dehydration. His hospital course is detailed below:  UTI Presented asymptomatic with UA of WBC greater than 50 and many bacteria notable only positive for nitrites and moderate leukocytes.  His home Jardiance was held during hospitalization. Received Rocephin in ED and discharged with Keflex 500 mg twice daily for total of 7-day treatment.  Dehydration/AKI Creatinine of 2.0 GFR 33 with baseline creatinine about 1.4.  Received 500 mL NS bolus and further IVF at maintenance rate until discharge.  His home Lasix was held during hospitalization.  Creatinine improved to 1.55 upon day of discharge.  Gross hematuria UA with large hemoglobin  and on second day of hospitalization urinating blood clots.  Not noticed by patient given demented state.  Renal/bladder ultrasound noted difficult to exclude irregular bladder wall thickening and advised nonemergent CT abdomen/pelvis with/without contrast. Advised to follow-up outpatient with urology.  Other chronic conditions were medically managed with home medications and formulary alternatives as necessary (HFrEF, HLD)  PCP Follow-up Recommendations: Ensure urology outpatient follow-up Consider restarting Lasix Restart Jardiance after completion of antibiotics for UTI Assistance for ALF memory care placement via community care management/social work referral  Significant Procedures: None  Significant Labs and Imaging:  Recent Labs  Lab 11/17/21 0950 11/18/21 0407  WBC 5.3 4.5  HGB 12.4* 11.2*  HCT 38.3* 35.3*  PLT 244 194   Recent Labs  Lab 11/17/21 0950 11/18/21 0407  NA 132* 135  K 4.3 4.1  CL 98 103  CO2 23 22  GLUCOSE 100* 95  BUN 25* 24*  CREATININE 2.00* 1.55*  CALCIUM 10.1 9.2  ALKPHOS 62  --   AST 50*  --   ALT 39  --   ALBUMIN 3.8  --    Results/Tests Pending at Time of Discharge: None  Discharge Medications:  Allergies as of 11/18/2021       Reactions   Ace Inhibitors Swelling, Other (See Comments)   Site of swelling not known by the patient   Valsartan Swelling, Other (See Comments)   Site of swelling not known by the patient        Medication List     STOP taking these medications    furosemide 40 MG tablet Commonly known as: LASIX       TAKE these medications    aspirin 81 MG chewable  tablet Chew 1 tablet (81 mg total) by mouth daily.   atorvastatin 80 MG tablet Commonly known as: LIPITOR Take 1 tablet (80 mg total) by mouth at bedtime. Make follow up appointment to receive further refills. Thank you. [phone] What changed:  when to take this additional instructions   cephALEXin 500 MG capsule Commonly known as:  KEFLEX Take 1 capsule (500 mg total) by mouth 2 (two) times daily for 6 days.   empagliflozin 10 MG Tabs tablet Commonly known as: Jardiance Take 1 tablet (10 mg total) by mouth daily before breakfast. Make follow up appointment to receive further refills. Thank you. [phone]   metoprolol succinate 25 MG 24 hr tablet Commonly known as: TOPROL-XL Take 1 tablet (25 mg total) by mouth daily. Make follow up appointment to receive further refills. Thank you. [phone] What changed: additional instructions        Discharge Instructions: Please refer to Patient Instructions section of EMR for full details.  Patient was counseled important signs and symptoms that should prompt return to medical care, changes in medications, dietary instructions, activity restrictions, and follow up appointments.   Follow-Up Appointments: Future Appointments  Date Time Provider [place]  11/23/2021 10:30 AM Carollee Leitz, MD [facility]  11/27/2021  3:15 PM [facility]-CASE MANAGER [facility] [place]  12/15/2021 10:00 AM [facility] SOCIAL WORK [facility] [place], Tearia Gibbs, DO 11/18/2021, 3:25 PM PGY-1, [place]

## 2021-11-18 NOTE — ED Notes (Signed)
When going to assist pt back to the bed from the bathroom pt was not in his bathroom or room his gown was on the ground and had clothed himself.  Security, charge Rn and SW notified.  Pt was found by SW this RN and pt attending MD fully dressed outside waiting in the front.  Pt was assisted back to his room by the 3 of [name] and placed back in the bed in a gown and given a sandwich

## 2021-11-18 NOTE — ED Notes (Addendum)
Pt daughter at bedside, pt Is up to the bathroom daughter has pt wallet

## 2021-11-18 NOTE — ED Notes (Signed)
Lunch order placed

## 2021-11-18 NOTE — ED Notes (Signed)
US at the bedside

## 2021-11-18 NOTE — Discharge Instructions (Addendum)
Dear [name],   Thank you for letting us participate in your care! In this section, you will find a brief hospital admission summary of why you were admitted to the hospital, what happened during your admission, your diagnosis/diagnoses, and recommended follow up.  Your testing revealed you were dehydrated with a kidney injury and had a urinary infection.  We began treating you with antibiotics and have sent you home with Keflex to take for a total 7 days of antibiotics.  You received IV fluids while you are hospitalized which resolved your dehydration and kidney injury.  On the second day of hospitalization you urinated blood clots which prompted [name] to ultrasound your bladder and kidneys.  You are being referred to urology for further follow-up of this.  Please call [facility] at [phone] to set up an appointment. Please follow-up with our office at your scheduled appointment on 6/19 @ 10:30AM  Thank you for choosing [facility]! Take care and be well!  [facility]  [place], [place] [address] [phone]

## 2021-11-18 NOTE — Evaluation (Signed)
Physical Therapy Evaluation Patient Details Name: [name] MRN: [id number] DOB: [birth year] Today's Date: 11/18/2021  History of Present Illness  81 y.o. male presenting with a chief complaint of feeling sick when he called EMS.  Upon arrival, patient denied any symptoms which may have been related to his baseline dementia. Brought to the ED 6/13 and found to have AKI and UTI. PMH:DM2, CHF, HTN  Clinical Impression  Pt only oriented to self on entry however reports PTA pt living alone in single story home, used a cane to walk to the grocery store and was completely independent in his iADLs. Pt likely to be poor historian. Pt limited in safe mobility by decreased balance, decreased awareness of DME and safety. Pt is supervision for bed mobility, min guard for transfers and min physical guard and maximal cuing for RW use during ambulation and minA for ambulation without AD due to decreased balance. Pt request to use toilet before return to bed and noted to be passing clots of blood RN notified. Per notes daughter would like placement in Memory care unit. For pt safety PT concurs with recommendation for constant supervision and min A for mobility. PT will continue to follow acutely.        Recommendations for follow up therapy are one component of a multi-disciplinary discharge planning process, led by the attending physician.  Recommendations may be updated based on patient status, additional functional criteria and insurance authorization.  Follow Up Recommendations Other (comment) (Memory care facility)    Assistance Recommended at Discharge Frequent or constant Supervision/Assistance  Patient can return home with the following  A little help with walking and/or transfers;A little help with bathing/dressing/bathroom;Assistance with cooking/housework;Assistance with feeding;Direct supervision/assist for medications management;Direct supervision/assist for financial management;Assist for  transportation;Help with stairs or ramp for entrance    Equipment Recommendations Rolling walker (2 wheels)     Functional Status Assessment Patient has had a recent decline in their functional status and demonstrates the ability to make significant improvements in function in a reasonable and predictable amount of time.     Precautions / Restrictions Precautions Precautions: Fall Restrictions Weight Bearing Restrictions: No      Mobility  Bed Mobility Overal bed mobility: Needs Assistance Bed Mobility: Supine to Sit, Sit to Supine     Supine to sit: Supervision Sit to supine: Supervision   General bed mobility comments: supervision for safety    Transfers Overall transfer level: Needs assistance Equipment used: Rolling walker (2 wheels) Transfers: Sit to/from Stand Sit to Stand: Min guard           General transfer comment: min guard for safey, vc for hand placement for power up to RW    Ambulation/Gait Ambulation/Gait assistance: Min guard, Min assist Gait Distance (Feet): 50 Feet Assistive device: Rolling walker (2 wheels), None Gait Pattern/deviations: Step-through pattern, Decreased step length - right, Decreased step length - left, Shuffle, Drifts right/left Gait velocity: slowed Gait velocity interpretation: <1.8 ft/sec, indicate of risk for recurrent falls   General Gait Details: min guard for ambulation with RW however requires constant cuing for proximity, attempt ambulation without AD and pt with decreased balance requiring min A for steadying      Balance Overall balance assessment: Needs assistance Sitting-balance support: Feet supported, No upper extremity supported Sitting balance-Leahy Scale: Good     Standing balance support: No upper extremity supported, During functional activity, Reliant on assistive device for balance Standing balance-Leahy Scale: Fair Standing balance comment: unable to tolerate any perturbance  Pertinent Vitals/Pain Pain Assessment Pain Assessment: No/denies pain    Home Living Family/patient expects to be discharged to:: Private residence Living Arrangements: Alone   Type of Home: House Home Access: Level entry       Home Layout: One level Home Equipment: Cane - single point      Prior Function Prior Level of Function : Independent/Modified Independent             Mobility Comments: reports he walks to grocery store with his cane ADLs Comments: reports he does all his cooking and cleaning, putting his laundry out on the line     Hand Dominance        Extremity/Trunk Assessment   Upper Extremity Assessment Upper Extremity Assessment: Overall WFL for tasks assessed    Lower Extremity Assessment Lower Extremity Assessment: Overall WFL for tasks assessed       Communication   Communication: HOH  Cognition Arousal/Alertness: Awake/alert Behavior During Therapy: Flat affect Overall Cognitive Status: Impaired/Different from baseline Area of Impairment: Orientation, Attention, Memory, Following commands, Safety/judgement, Awareness, Problem solving                 Orientation Level: Disoriented to, Place, Time, Situation Current Attention Level: Selective Memory: Decreased short-term memory Following Commands: Follows one step commands with increased time, Follows multi-step commands with increased time Safety/Judgement: Decreased awareness of safety, Decreased awareness of deficits Awareness: Emergent Problem Solving: Slow processing, Requires verbal cues, Requires tactile cues General Comments: Pt oriented only to himself, has decreased safety awareness and requires increased cuing for safe RW usage, very worried about the whereabouts of his belongs and cane        General Comments General comments (skin integrity, edema, etc.): Pt request to use toilet, pt noted to be passing moderate size blood clots with urination, pt  reports no pain, RN notified        Assessment/Plan    PT Assessment Patient needs continued PT services  PT Problem List Decreased balance;Decreased cognition;Decreased safety awareness;Decreased knowledge of use of DME       PT Treatment Interventions DME instruction;Gait training;Functional mobility training;Therapeutic activities;Therapeutic exercise;Balance training;Cognitive remediation;Patient/family education    PT Goals (Current goals can be found in the Care Plan section)  Acute Rehab PT Goals Patient Stated Goal: go home PT Goal Formulation: Patient unable to participate in goal setting Potential to Achieve Goals: Fair    Frequency Min 3X/week        AM-PAC PT "6 Clicks" Mobility  Outcome Measure Help needed turning from your back to your side while in a flat bed without using bedrails?: None Help needed moving from lying on your back to sitting on the side of a flat bed without using bedrails?: None Help needed moving to and from a bed to a chair (including a wheelchair)?: A Little Help needed standing up from a chair using your arms (e.g., wheelchair or bedside chair)?: A Little Help needed to walk in hospital room?: A Little Help needed climbing 3-5 steps with a railing? : Total 6 Click Score: 18    End of Session Equipment Utilized During Treatment: Gait belt Activity Tolerance: Patient tolerated treatment well Patient left: in bed;with call bell/phone within reach Nurse Communication: Mobility status;Other (comment) (passing blood clots) PT Visit Diagnosis: Unsteadiness on feet (R26.81);Muscle weakness (generalized) (M62.81)    Time: 6433-2951 PT Time Calculation (min) (ACUTE ONLY): 27 min   Charges:   PT Evaluation $PT Eval Moderate Complexity: 1 Mod PT Treatments $Therapeutic Activity: 8-22  mins        [name] B. Migdalia Dk PT, DPT Acute Rehabilitation Services Please use secure chat or  Call Office [phone]   [place] 11/18/2021, 1:31 PM

## 2021-11-18 NOTE — Evaluation (Signed)
Occupational Therapy Evaluation Patient Details Name: [name] MRN: [id number] DOB: March 09, 1941 Today's Date: 11/18/2021   History of Present Illness 81 y.o. male presenting with a chief complaint of feeling sick when he called EMS.  Upon arrival, patient denied any symptoms which may have been related to his baseline dementia. Brought to the ED 6/13 and found to have AKI and UTI. PMH:DM2, CHF, HTN   Clinical Impression   Patient admitted for the diagnosis above.  PTA he lived alone, with occasional assist from family.  Currently the patient is close to his baseline, but appears to need increasing assist at home due to progressing cognitive decline and safety concerns.  Patient needs at least 24 hour supervision at home.  No significant OT needs exist in the acute setting.  Recommend LTC memory unit or a similar facility that can provide the needed assist/oversight.  OT recommends out of bed for meals.        Recommendations for follow up therapy are one component of a multi-disciplinary discharge planning process, led by the attending physician.  Recommendations may be updated based on patient status, additional functional criteria and insurance authorization.   Follow Up Recommendations  Long-term institutional care without follow-up therapy    Assistance Recommended at Discharge Frequent or constant Supervision/Assistance  Patient can return home with the following A little help with walking and/or transfers;A little help with bathing/dressing/bathroom;Assistance with cooking/housework;Assist for transportation;Direct supervision/assist for financial management;Direct supervision/assist for medications management    Functional Status Assessment  Patient has not had a recent decline in their functional status  Equipment Recommendations  None recommended by OT    Recommendations for Other Services       Precautions / Restrictions Precautions Precautions:  Fall Restrictions Weight Bearing Restrictions: No      Mobility Bed Mobility Overal bed mobility: Needs Assistance Bed Mobility: Supine to Sit, Sit to Supine     Supine to sit: Supervision Sit to supine: Supervision        Transfers Overall transfer level: Needs assistance Equipment used: 1 person hand held assist               General transfer comment: walk to restroom      Balance Overall balance assessment: Needs assistance Sitting-balance support: Feet supported, No upper extremity supported Sitting balance-Leahy Scale: Good     Standing balance support: Single extremity supported Standing balance-Leahy Scale: Fair Standing balance comment: did reaqch out for objects in his environment to stabilize himself                           ADL either performed or assessed with clinical judgement   ADL                                         General ADL Comments: generalized supervision seated.  Min Guard at times due to decreased safety.     Vision Baseline Vision/History: 1 Wears glasses Patient Visual Report: No change from baseline       Perception Perception Perception: Not tested   Praxis Praxis Praxis: Not tested    Pertinent Vitals/Pain Pain Assessment Pain Assessment: No/denies pain     Hand Dominance Right   Extremity/Trunk Assessment Upper Extremity Assessment Upper Extremity Assessment: Overall WFL for tasks assessed   Lower Extremity Assessment Lower Extremity Assessment: Defer to PT evaluation  Cervical / Trunk Assessment Cervical / Trunk Assessment: Normal   Communication Communication Communication: HOH;No difficulties   Cognition Arousal/Alertness: Awake/alert Behavior During Therapy: WFL for tasks assessed/performed Overall Cognitive Status: History of cognitive impairments - at baseline                                       General Comments  VSS on RA    Exercises      Shoulder Instructions      Home Living Family/patient expects to be discharged to:: Private residence Living Arrangements: Alone Available Help at Discharge: Family;Available PRN/intermittently Type of Home: House Home Access: Level entry     Home Layout: One level     Bathroom Shower/Tub: Tub/shower unit         Home Equipment: Cane - single point          Prior Functioning/Environment Prior Level of Function : Other (comment);Independent/Modified Independent (Family oversight)             Mobility Comments: reports he walks to grocery store with his cane ADLs Comments: Patient performs his own ADL/iADL, family assists with bill payment.  Patient has pill packs delivered for meds, and patient is able to take them.  Family assists with community mobility.        OT Problem List: Decreased cognition;Decreased safety awareness;Impaired balance (sitting and/or standing)      OT Treatment/Interventions:      OT Goals(Current goals can be found in the care plan section) Acute Rehab OT Goals Patient Stated Goal: None stated OT Goal Formulation: With patient Time For Goal Achievement: 11/30/21 Potential to Achieve Goals: Good  OT Frequency:      Co-evaluation              AM-PAC OT "6 Clicks" Daily Activity     Outcome Measure Help from another person eating meals?: None Help from another person taking care of personal grooming?: A Little Help from another person toileting, which includes using toliet, bedpan, or urinal?: A Little Help from another person bathing (including washing, rinsing, drying)?: A Little Help from another person to put on and taking off regular upper body clothing?: None Help from another person to put on and taking off regular lower body clothing?: A Little 6 Click Score: 20   End of Session Equipment Utilized During Treatment: Gait belt Nurse Communication: Mobility status  Activity Tolerance: Patient tolerated treatment  well Patient left: in bed;with call bell/phone within reach  OT Visit Diagnosis: Unsteadiness on feet (R26.81)                Time: 0768-0881 OT Time Calculation (min): 12 min Charges:  OT General Charges $OT Visit: 1 Visit OT Evaluation $OT Eval Moderate Complexity: 1 Mod  11/18/2021  RP, OTR/L  Acute Rehabilitation Services  Office:  [phone]   Metta Clines 11/18/2021, 2:35 PM

## 2021-11-18 NOTE — Progress Notes (Signed)
CSW spoke with patients daughter to find out if patient has medicaid. Ms. Rock Nephew stated she tried to apply 2 years ago but never heard back. CSW stated this will be needed for placement unless they can pay out of pocket. CSW encouraged the daughter to reach out to DSS and start an application. CSW also left a VM with [employer] to see if they can assist the daughter with applying for medicaid.

## 2021-11-23 ENCOUNTER — Ambulatory Visit: Payer: Medicare PPO | Admitting: Family Medicine

## 2021-11-23 ENCOUNTER — Encounter: Payer: Self-pay | Admitting: Family Medicine

## 2021-11-23 VITALS — BP 112/64 | HR 60 | Wt 134.0 lb

## 2021-11-23 DIAGNOSIS — I5022 Chronic systolic (congestive) heart failure: Secondary | ICD-10-CM | POA: Diagnosis not present

## 2021-11-23 DIAGNOSIS — R319 Hematuria, unspecified: Secondary | ICD-10-CM | POA: Diagnosis not present

## 2021-11-23 DIAGNOSIS — F03918 Unspecified dementia, unspecified severity, with other behavioral disturbance: Secondary | ICD-10-CM | POA: Diagnosis not present

## 2021-11-23 DIAGNOSIS — N390 Urinary tract infection, site not specified: Secondary | ICD-10-CM

## 2021-11-23 DIAGNOSIS — R310 Gross hematuria: Secondary | ICD-10-CM | POA: Diagnosis not present

## 2021-11-23 DIAGNOSIS — N179 Acute kidney failure, unspecified: Secondary | ICD-10-CM | POA: Diagnosis not present

## 2021-11-23 NOTE — Progress Notes (Signed)
    SUBJECTIVE:   CHIEF COMPLAINT / HPI:   Patient admitted to hospital between 06/13 and 06/14 and treated for AKI and UTI. Discharged on 06/14. Presents today for follow up. Reports improvement in symptoms.  Denies any fevers, chest pain, shortness of breath requires repeat BMP/ renal function panel today.  PERTINENT  PMH / PSH:  HFrEF Hypertension Type 2 diabetes Dementia Hearing impairment  OBJECTIVE:   BP 112/64   Pulse 60   Wt 134 lb (60.8 kg)   SpO2 98%   BMI 17.20 kg/m    General: Alert, no acute distress Cardio: Normal S1 and S2, RRR, no r/m/g Pulm: CTAB, normal work of breathing Abdomen: Bowel sounds normal. Abdomen soft and non-tender.  Extremities: Trace peripheral edema.   ASSESSMENT/PLAN:   UTI (urinary tract infection) Recent hospitalization for AKI/UTI.  Cultures showed multispeciation.  Has 2 days left of antibiotics.  Currently asymptomatic. -Complete course of antibiotics -Urology referral has been sent. -Follow-up with PCP in 1 to 2 weeks.   Gross hematuria Denies any hematuria Urology referral sent during hospitalization. Resent urology referral today Follow-up PCP 1 to 2 weeks  Dementia with behavioral disturbance (HCC) POA, daughter Kingsley Plan, questing assistance for ALF.  Reports initially started with social worker in hospital.  She reports when she called Medicaid they told her that he already had Medicaid coverage.  I have sent a message to Christen Butter, CCM social worker, and have requested she reach out to her to discuss continuation of this process.   Recommend follow-up PCP in 1 to 2 weeks.  Chronic HFrEF (heart failure with reduced ejection fraction) (HCC) Recently hospitalized for AKI.  Lasix and Jardiance held during that time.  Patient is euvolemic on exam today. -Bmet and BMP today -Will continue to hold Lasix and Jardiance until results of labs -Strict return precautions provided -Follow-up with PCP in 1 to 2 weeks.      Dana Allan, MD [facility]

## 2021-11-23 NOTE — Patient Instructions (Signed)
Thank you for coming to see me today. It was a pleasure.   Complete course of antibiotics that was prescribed for your urine infection. We will get some labs today.  If they are abnormal or we need to do something about them, I will call you.  If they are normal, I will send you a message on MyChart (if it is active) or a letter in the mail.  If you don't hear from [name] in 2 weeks, please call the office at the number below.   Urology referral has been placed.  They will call you with an appointment.  If you do not hear anything within the next week or 2 please call the clinic to let us know.  If you have any shortness of breath, chest pain, fevers or decrease in urinary frequency please notify MD  Follow-up with your primary care doctor in 1 to 2 weeks to discuss restarting Lasix and Jardiance.  If you have any questions or concerns, please do not hesitate to call the office at [phone].  Best,   Carollee Leitz, MD

## 2021-11-23 NOTE — Assessment & Plan Note (Addendum)
Recent hospitalization for AKI/UTI.  Cultures showed multispeciation.  Has 2 days left of antibiotics.  Currently asymptomatic. -Complete course of antibiotics -Urology referral has been sent. -Follow-up with PCP in 1 to 2 weeks.

## 2021-11-23 NOTE — Assessment & Plan Note (Signed)
POA, daughter Corky Sing, questing assistance for ALF.  Reports initially started with social worker in hospital.  She reports when she called Medicaid they told her that he already had Medicaid coverage.  I have sent a message to [name], CCM social worker, and have requested she reach out to her to discuss continuation of this process.   Recommend follow-up PCP in 1 to 2 weeks.

## 2021-11-23 NOTE — Assessment & Plan Note (Signed)
Denies any hematuria Urology referral sent during hospitalization. Resent urology referral today Follow-up PCP 1 to 2 weeks

## 2021-11-24 LAB — BASIC METABOLIC PANEL
BUN/Creatinine Ratio: 10 (ref 10–24)
BUN: 13 mg/dL (ref 8–27)
CO2: 25 mmol/L (ref 20–29)
Calcium: 9.9 mg/dL (ref 8.6–10.2)
Chloride: 102 mmol/L (ref 96–106)
Creatinine, Ser: 1.36 mg/dL — ABNORMAL HIGH (ref 0.76–1.27)
Glucose: 153 mg/dL — ABNORMAL HIGH (ref 70–99)
Potassium: 4.3 mmol/L (ref 3.5–5.2)
Sodium: 139 mmol/L (ref 134–144)
eGFR: 52 mL/min/{1.73_m2} — ABNORMAL LOW (ref >59)

## 2021-11-24 LAB — BRAIN NATRIURETIC PEPTIDE: BNP: 95.3 pg/mL (ref 0.0–100.0)

## 2021-11-24 NOTE — Assessment & Plan Note (Signed)
Recently hospitalized for AKI.  Lasix and Jardiance held during that time.  Patient is euvolemic on exam today. -Bmet and BMP today -Will continue to hold Lasix and Jardiance until results of labs -Strict return precautions provided -Follow-up with PCP in 1 to 2 weeks.

## 2021-11-25 ENCOUNTER — Ambulatory Visit: Payer: Medicare PPO | Admitting: Licensed Clinical Social Worker

## 2021-11-25 NOTE — Chronic Care Management (AMB) (Signed)
  Care Management   Social Work Visit Note  11/25/2021 Name: Winner Valeriano MRN: [id number] DOB: 07-20-1940  Yazid Pop is a 81 y.o. year old male who sees [name], DO for primary care. The care management team was consulted for assistance with care management and care coordination needs related to Level of Care Concerns   Patient was given the following information about care management and care coordination services today, agreed to services, and gave verbal consent: 1.care management/care coordination services include personalized support from designated clinical staff supervised by their physician, including individualized plan of care and coordination with other care providers 2. 24/7 contact phone numbers for assistance for urgent and routine care needs. 3. The patient may stop care management/care coordination services at any time by phone call to the office staff.  Engaged with patient by telephone for initial visit in response to provider referral for social work chronic care management and care coordination services.  Assessment: Review of patient history, allergies, and health status during evaluation of patient need for care management/care coordination services.    Interventions:  Patient interviewed and appropriate assessments performed Collaborated with clinical team regarding patient needs  Successful outreach to patients daughter. Fl2 form sent to provider on today.  SW advised patient to select a placement agency and start the process.  SW will email fl2 once received back from provider.  Patient stated no additional concerns.        Plan:  SW to follow up within 30 days.  [name] , MSW Social Worker [facility]  [phone]

## 2021-11-27 ENCOUNTER — Ambulatory Visit: Payer: Medicare PPO

## 2021-11-30 ENCOUNTER — Telehealth: Payer: Self-pay

## 2021-12-01 ENCOUNTER — Encounter: Payer: Self-pay | Admitting: Family Medicine

## 2021-12-01 NOTE — Telephone Encounter (Signed)
Patients daughter called to check the status of FL2 form. She stated it is needed URGENTLY with the place holding a bed for him.  Please advise.  Thanks!

## 2021-12-01 NOTE — Progress Notes (Signed)
Letter sent to patient with blood work.  Discussed results with daughter Su Hoff. Follow up with Cardiology Follow up with Urology Provided phone numbers for above clinics Follow up with PCP.   Dana Allan, MD [facility]

## 2021-12-07 ENCOUNTER — Ambulatory Visit: Payer: Self-pay | Admitting: Licensed Clinical Social Worker

## 2021-12-07 ENCOUNTER — Ambulatory Visit (INDEPENDENT_AMBULATORY_CARE_PROVIDER_SITE_OTHER): Payer: Medicare PPO

## 2021-12-07 DIAGNOSIS — Z111 Encounter for screening for respiratory tuberculosis: Secondary | ICD-10-CM

## 2021-12-07 NOTE — Telephone Encounter (Signed)
Patients daughter presents to nurse clinic with patient for TB test.   She is requesting FL2 form.   Do we have any updates on this?

## 2021-12-07 NOTE — Progress Notes (Signed)
PPD placed in right ventral forearm. Patient to return on 7/5 to have site read.

## 2021-12-07 NOTE — Chronic Care Management (AMB) (Signed)
  Care Management   Social Work Visit Note  12/07/2021 Name: [name] MRN: [id number] DOB: [birth year]  [name] is a 81 y.o. year old male who sees [name], DO for primary care. The care management team was consulted for assistance with care management and care coordination needs related to [name] received message that patients daughter was in need of completed FL2 form. SW received faxed document from [name] on 12/01/2021. Per daughters request, document was emailed to [name]. SW verified document was successfully emailed to patient on 12/01/2021. Daughter checked e-mail on today and verified Fl2 form was received.   [name] , MSW Social Worker [facility] Care Management  [phone]

## 2021-12-09 ENCOUNTER — Ambulatory Visit (INDEPENDENT_AMBULATORY_CARE_PROVIDER_SITE_OTHER): Payer: Medicare PPO

## 2021-12-09 DIAGNOSIS — Z111 Encounter for screening for respiratory tuberculosis: Secondary | ICD-10-CM

## 2021-12-09 LAB — TB SKIN TEST
Induration: 0 mm
TB Skin Test: NEGATIVE

## 2021-12-14 NOTE — Progress Notes (Signed)
Patient is here for a PPD read.  It was placed on 12/07/2021 in the right forearm @ 10:20 am.    PPD RESULTS:  Result: negative Induration: 0 mm  Letter created and given to patient for documentation purposes. Talbot Grumbling, RN

## 2021-12-14 NOTE — Telephone Encounter (Signed)
Daughter left message on referral line inquiring about the FL2 form.  Will send message to [facility].  Jennipher Weatherholtz,CMA

## 2021-12-14 NOTE — Telephone Encounter (Signed)
FL2 forms are in provider's box to be completed.  There was a previous form attached for guidance.  Thanks [employer]

## 2021-12-15 ENCOUNTER — Ambulatory Visit: Payer: Medicare PPO | Admitting: Licensed Clinical Social Worker

## 2021-12-15 DIAGNOSIS — N401 Enlarged prostate with lower urinary tract symptoms: Secondary | ICD-10-CM | POA: Diagnosis not present

## 2021-12-15 DIAGNOSIS — R310 Gross hematuria: Secondary | ICD-10-CM | POA: Diagnosis not present

## 2021-12-15 DIAGNOSIS — R3914 Feeling of incomplete bladder emptying: Secondary | ICD-10-CM | POA: Diagnosis not present

## 2021-12-15 NOTE — Patient Instructions (Signed)
Visit Information  Instructions: patient will work with SW to address concerns related to [facility] forms.  Patient was given the following information about care management and care coordination services today, agreed to services, and gave verbal consent: 1.care management/care coordination services include personalized support from designated clinical staff supervised by their physician, including individualized plan of care and coordination with other care providers 2. 24/7 contact phone numbers for assistance for urgent and routine care needs. 3. The patient may stop care management/care coordination services at any time by phone call to the office staff.  Patient verbalizes understanding of instructions and care plan provided today and agrees to view in [place]. Active MyChart status and patient understanding of how to access instructions and care plan via MyChart confirmed with patient.     The care management team will reach out to the patient again over the next 30 days.   [name] , MSW Social Worker [facility]  [phone]

## 2021-12-15 NOTE — Chronic Care Management (AMB) (Signed)
  Care Management   Social Work Visit Note  12/15/2021 Name: [name] MRN: [id number] DOB: 01-05-1941  [name] is a 81 y.o. year old male who sees [name], DO for primary care. The care management team was consulted for assistance with care management and care coordination needs related to Level of Care Concerns   Patient was given the following information about care management and care coordination services today, agreed to services, and gave verbal consent: 1.care management/care coordination services include personalized support from designated clinical staff supervised by their physician, including individualized plan of care and coordination with other care providers 2. 24/7 contact phone numbers for assistance for urgent and routine care needs. 3. The patient may stop care management/care coordination services at any time by phone call to the office staff.  Engaged with patient by telephone for follow up visit in response to provider referral for social work chronic care management and care coordination services.  Assessment: Review of patient history, allergies, and health status during evaluation of patient need for care management/care coordination services.    Interventions:  Patient interviewed and appropriate assessments performed Collaborated with clinical team regarding patient needs  Patient in need of corrected Fl2 forms by provider. Patient has placement. Placement can not move forward until Fl2 is received. Fl2 form has been placed in providers box.       Plan:  SW will follow up with PCP regarding Fl2 forms.  [name], MSW  Social Worker [facility]  [phone]

## 2021-12-17 NOTE — Telephone Encounter (Signed)
Patient's daughter returns call to nurse line to check status of paperwork.   Please advise.  Talbot Grumbling, RN

## 2021-12-17 NOTE — Telephone Encounter (Signed)
Called daughter back and let her know that the completed form was on Shae's desk this morning.  Copy made and original placed up front for her to pick up.  Will leave a copy on Shae's desk and then a copy in bath scanning.  Daughter is on her way to pick it up. Jovannie Ulibarri,CMA

## 2021-12-23 ENCOUNTER — Ambulatory Visit: Payer: Self-pay | Admitting: Licensed Clinical Social Worker

## 2021-12-23 NOTE — Patient Instructions (Signed)
Visit Information  Instructions:   Patient was given the following information about care management and care coordination services today, agreed to services, and gave verbal consent: 1.care management/care coordination services include personalized support from designated clinical staff supervised by their physician, including individualized plan of care and coordination with other care providers 2. 24/7 contact phone numbers for assistance for urgent and routine care needs. 3. The patient may stop care management/care coordination services at any time by phone call to the office staff.  Patient verbalizes understanding of instructions and care plan provided today and agrees to view in [place]. Active MyChart status and patient understanding of how to access instructions and care plan via MyChart confirmed with patient.     No additional follow up needed.   [name] , MSW Social Worker [facility]  [phone]

## 2021-12-23 NOTE — Patient Outreach (Signed)
  Care Coordination   Follow Up Visit Note   12/23/2021 Name: [name] MRN: [id number] DOB: [birth year]  [name] is a 81 y.o. year old male who sees [name], DO for primary care. I spoke with  [name] by phone today  What matters to the patients health and wellness today?  Placement    Goals Addressed   None     SDOH assessments and interventions completed:   Yes   Care Coordination Interventions Activated:  Yes Care Coordination Interventions:   Yes, provided  Follow up plan: No further intervention required.  Encounter Outcome:  Pt. Visit Completed  [name], MSW  Social Worker [facility]  [phone]

## 2021-12-25 ENCOUNTER — Other Ambulatory Visit: Payer: Self-pay | Admitting: Cardiology

## 2021-12-25 DIAGNOSIS — N281 Cyst of kidney, acquired: Secondary | ICD-10-CM | POA: Diagnosis not present

## 2021-12-25 DIAGNOSIS — R310 Gross hematuria: Secondary | ICD-10-CM | POA: Diagnosis not present

## 2021-12-25 MED ORDER — EMPAGLIFLOZIN 10 MG PO TABS: 10.0000 mg | ORAL_TABLET | Freq: Every day | ORAL | 0 refills | Status: DC

## 2022-01-02 ENCOUNTER — Other Ambulatory Visit: Payer: Self-pay | Admitting: Cardiology

## 2022-01-06 ENCOUNTER — Ambulatory Visit: Payer: Medicare PPO | Admitting: Cardiology

## 2022-01-06 ENCOUNTER — Other Ambulatory Visit: Payer: Self-pay | Admitting: Cardiology

## 2022-01-07 ENCOUNTER — Telehealth: Payer: Self-pay

## 2022-01-07 NOTE — Patient Outreach (Signed)
  Care Coordination   Follow Up Visit Note   01/07/2022 Name: [name] MRN: [id number] DOB: September 12, 1940  [name] is a 81 y.o. year old male who sees [name] Gilding, DO for primary care. I  spoke with the patient's daughter.  What matters to the patients health and wellness today?  Assisted Living    Goals Addressed               This Visit's Progress     COMPLETED: Facility Placement (pt-stated)        Care Coordination Interventions:  Solution-Focused Strategies employed:  Active listening / Reflection utilized  Problem Solving [name] strategies reviewed Caregiver stress acknowledged  Collaborated with Social Workers [name] and [name] Made referral to [name] assessments and interventions completed:  No     Care Coordination Interventions Activated:  Yes  Care Coordination Interventions:  Yes, provided   Follow up plan: No further intervention required.RN Referral made to [name]  Encounter Outcome:  Pt. Visit Completed   [name] Arms RN, BSN, [facility]   Phone: [phone]

## 2022-01-08 ENCOUNTER — Ambulatory Visit: Payer: Self-pay | Admitting: Licensed Clinical Social Worker

## 2022-01-08 NOTE — Patient Outreach (Signed)
  Care Coordination   Follow Up Visit Note   01/08/2022 Name: [name] MRN: [id number] DOB: [birth year]  [name] is a 81 y.o. year old male who sees [name], DO for primary care. I spoke with  [name] by phone today. SW also attempted [phone] [name]) and Sent an email to Chauncy Lean at [employer]   What matters to the patients health and wellness today?  Patient granddaughter is concern with her fathers level of care. FL2 has been completed. Patient has a case worker at [facility]. Case worker at [facility] is not responding to patients request. SW advised patient to speak with case Clinical research associate and escalate to Geophysical data processor. SW has sent e-mail to DSS case worker and attempted a call. SW will continue to help patient advocate for assistance with placing her father in a Assistance living facility.     SDOH assessments and interventions completed:  Yes     Care Coordination Interventions Activated:  Yes  Care Coordination Interventions:  Yes, provided   Follow up plan: Follow up call scheduled for within the next 30 days.    Encounter Outcome:  Pt. Visit Completed   [name], MSW  Social Worker [facility]  [phone]

## 2022-01-08 NOTE — Patient Instructions (Signed)
Visit Information  Instructions: patient will work with SW to address concerns related to Level of care  Patient was given the following information about care management and care coordination services today, agreed to services, and gave verbal consent: 1.care management/care coordination services include personalized support from designated clinical staff supervised by their physician, including individualized plan of care and coordination with other care providers 2. 24/7 contact phone numbers for assistance for urgent and routine care needs. 3. The patient may stop care management/care coordination services at any time by phone call to the office staff.  Patient verbalizes understanding of instructions and care plan provided today and agrees to view in [place]. Active MyChart status and patient understanding of how to access instructions and care plan via MyChart confirmed with patient.     The care management team will reach out to the patient again over the next 30 days.   [name] , MSW Social Worker [facility]  [phone]

## 2022-01-11 NOTE — Progress Notes (Unsigned)
Office Visit    Patient Name: [name] Date of Encounter: 01/11/2022  PCP:  Jones, Sarah, [facility]  Cardiologist:  Candee Furbish, MD  Advanced Practice Provider:  No care team member to display Electrophysiologist:  None   Chief Complaint    [name] is a 81 y.o. 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  The patient had an echocardiogram 08/24/2020 which showed EF 20 to 25%, grade 1 DD, mildly reduced RV SF, moderate LAE, trivial pericardial effusion, mild MR, mild to moderate AI, moderate dilatation of aortic root (47 mm), mild dilatation of ascending aorta (42 mm).  Patient was last seen by Dr. Marlou Porch 11/05/2020 at that time he had been doing quite well.  He lives in by himself with his daughter's assistance.  He denied any orthopnea, fevers, chills, nausea, vomiting, and chest pain.  His daughter assisted with historical facts.  Patient does have underlying dementia.  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  The ekg ordered today demonstrates ***  Recent Labs: 11/17/2021: ALT 39 11/18/2021: Hemoglobin 11.2; Platelets 194 11/23/2021: BNP 95.3; BUN 13; Creatinine, Ser 1.36; Potassium 4.3; Sodium 139  Recent Lipid Panel    Component Value Date/Time   CHOL 195 08/11/2020 1033   TRIG 55 08/11/2020 1033   HDL 38 (L) 08/11/2020 1033   CHOLHDL 5.1 (H) 08/11/2020 1033   CHOLHDL 6.1 08/02/2014 1423   VLDL 29 08/02/2014 1423   LDLCALC 147 (H) 08/11/2020 1033    Risk Assessment/Calculations:  {Does this patient have ATRIAL FIBRILLATION?:[phone]}  Home Medications   No outpatient medications  have been marked as taking for the 01/12/22 encounter (Appointment) with Elgie Collard, PA-C.     Review of Systems   ***   All other systems reviewed and are otherwise negative except as noted above.  Physical Exam    VS:  There were no vitals taken for this visit. , BMI There is no height or weight on file to calculate BMI.  Wt Readings from Last 3 Encounters:  11/23/21 134 lb (60.8 kg)  09/22/21 129 lb (58.5 kg)  11/05/20 129 lb (58.5 kg)     GEN: Well nourished, well developed, in no acute distress. HEENT: normal. Neck: Supple, no JVD, carotid bruits, or masses. Cardiac: ***RRR,  no murmurs, rubs, or gallops. No clubbing, cyanosis, edema.  ***Radials/PT 2+ and equal bilaterally.  Respiratory:  ***Respirations regular and unlabored, clear to auscultation bilaterally. GI: Soft, nontender, nondistended. MS: No deformity or atrophy. Skin: Warm and dry, no rash. Neuro:  Strength and sensation are intact. Psych: Normal affect.  Assessment & Plan    Chronic systolic heart failure -Trying to optimize medical therapy however limited by low blood pressure -Continue Jardiance 10 mg daily, atorvastatin 80 mg daily, furosemide 20 mg daily, and metoprolol 25 mg daily  Dilated aortic root -47 mm on echocardiogram, no further workup is necessary at this time  Essential hypertension Mixed hyperlipidemia  No BP recorded.  {Refresh Note OR Click here to enter BP  :1}***      Disposition: Follow up {follow up:15908} with Candee Furbish, MD or APP.  Signed, Elgie Collard, PA-C 01/11/2022, 9:06 PM [facility]

## 2022-01-12 ENCOUNTER — Encounter: Payer: Self-pay | Admitting: Physician Assistant

## 2022-01-12 ENCOUNTER — Ambulatory Visit: Payer: Medicare PPO | Admitting: Physician Assistant

## 2022-01-12 VITALS — BP 126/64 | HR 60 | Ht 74.0 in | Wt 141.0 lb

## 2022-01-12 DIAGNOSIS — I5020 Unspecified systolic (congestive) heart failure: Secondary | ICD-10-CM | POA: Diagnosis not present

## 2022-01-12 DIAGNOSIS — E782 Mixed hyperlipidemia: Secondary | ICD-10-CM | POA: Diagnosis not present

## 2022-01-12 DIAGNOSIS — I77810 Thoracic aortic ectasia: Secondary | ICD-10-CM | POA: Diagnosis not present

## 2022-01-12 DIAGNOSIS — I10 Essential (primary) hypertension: Secondary | ICD-10-CM | POA: Diagnosis not present

## 2022-01-12 MED ORDER — METOPROLOL SUCCINATE ER 25 MG PO TB24: 25.0000 mg | ORAL_TABLET | Freq: Every day | ORAL | 3 refills | Status: DC

## 2022-01-12 MED ORDER — EMPAGLIFLOZIN 10 MG PO TABS: 10.0000 mg | ORAL_TABLET | Freq: Every day | ORAL | 3 refills | Status: DC

## 2022-01-12 MED ORDER — ATORVASTATIN CALCIUM 80 MG PO TABS: 80.0000 mg | ORAL_TABLET | Freq: Every day | ORAL | 3 refills | Status: DC

## 2022-01-12 MED ORDER — FUROSEMIDE 40 MG PO TABS: 20.0000 mg | ORAL_TABLET | Freq: Every day | ORAL | 3 refills | Status: DC

## 2022-01-12 NOTE — Patient Instructions (Addendum)
Medication Instructions:  Take furosemide (Lasix) 40 mg for the next three days then resume your usual dose of 20 mg daily thereafter *If you need a refill on your cardiac medications before your next appointment, please call your pharmacy*   Lab Work: Lipids today If you have labs (blood work) drawn today and your tests are completely normal, you will receive your results only by: [place] (if you have MyChart) OR A paper copy in the mail If you have any lab test that is abnormal or we need to change your treatment, we will call you to review the results.  Follow-Up: At [facility], you and your health needs are our priority.  As part of our continuing mission to provide you with exceptional heart care, we have created designated Provider Care Teams.  These Care Teams include your primary Cardiologist (physician) and Advanced Practice Providers (APPs -  Physician Assistants and Nurse Practitioners) who all work together to provide you with the care you need, when you need it.  We recommend signing up for the patient portal called "MyChart".  Sign up information is provided on this After Visit Summary.  MyChart is used to connect with patients for Virtual Visits (Telemedicine).  Patients are able to view lab/test results, encounter notes, upcoming appointments, etc.  Non-urgent messages can be sent to your provider as well.   To learn more about what you can do with MyChart, go to [email or web address].    Your next appointment:   1 year(s)  The format for your next appointment:   In Person  Provider:   Candee Furbish, MD {  Other Instructions 1.Weigh yourself daily and keep a log of your weights 2.Monitor your leg swelling and let us know if it does not improve  Steps to Quit Smoking Smoking tobacco is the leading cause of preventable death. It can affect almost every organ in the body. Smoking puts you and those around you at risk for developing many serious chronic  diseases. Quitting smoking can be very challenging. Do not get discouraged if you are not successful the first time. Some people need to make many attempts to quit before they achieve long-term success. Do your best to stick to your quit plan, and talk with your health care provider if you have any questions or concerns. How do I get ready to quit? When you decide to quit smoking, create a plan to help you succeed. Before you quit: Pick a date to quit. Set a date within the next 2 weeks to give you time to prepare. Write down the reasons why you are quitting. Keep this list in places where you will see it often. Tell your family, friends, and co-workers that you are quitting. Support from people you are close to can make quitting easier. Talk with your health care provider about your options for quitting smoking. Find out what treatment options are covered by your health insurance. Identify people, places, things, and activities that make you want to smoke (triggers). Avoid them. What first steps can I take to quit smoking? Throw away all cigarettes at home, at work, and in your car. Throw away smoking accessories, such as Scientist, research (medical). Clean your car. Make sure to empty the ashtray. Clean your home, including curtains and carpets. What strategies can I use to quit smoking? Talk with your health care provider about combining strategies, such as taking medicines while you are also receiving in-person counseling. Using these two strategies together makes you more likely  to succeed in quitting than if you used either strategy on its own. If you are pregnant or breastfeeding, talk with your health care provider about finding counseling or other support strategies to quit smoking. Do not take medicine to help you quit smoking unless your health care provider tells you to. Quit right away Quit smoking completely, instead of gradually reducing how much you smoke over a period of time. Stopping  smoking right away may be more successful than gradually quitting. Attend in-person counseling to help you build problem-solving skills. You are more likely to succeed in quitting if you attend counseling sessions regularly. Even short sessions of 10 minutes can be effective. Take medicine You may take medicines to help you quit smoking. Some medicines require a prescription. You can also purchase over-the-counter medicines. Medicines may have nicotine in them to replace the nicotine in cigarettes. Medicines may: Help to stop cravings. Help to relieve withdrawal symptoms. Your health care provider may recommend: Nicotine patches, gum, or lozenges. Nicotine inhalers or sprays. Non-nicotine medicine that you take by mouth. Find resources Find resources and support systems that can help you quit smoking and remain smoke-free after you quit. These resources are most helpful when you use them often. They include: Online chats with a Social worker. Telephone quitlines. Printed Furniture conservator/restorer. Support groups or group counseling. Text messaging programs. Mobile phone apps or applications. Use apps that can help you stick to your quit plan by providing reminders, tips, and encouragement. Examples of free services include Quit Guide from the CDC and smokefree.gov  What can I do to make it easier to quit?  Reach out to your family and friends for support and encouragement. Call telephone quitlines, such as 1-800-QUIT-NOW, reach out to support groups, or work with a counselor for support. Ask people who smoke to avoid smoking around you. Avoid places that trigger you to smoke, such as bars, parties, or smoke-break areas at work. Spend time with people who do not smoke. Lessen the stress in your life. Stress can be a smoking trigger for some people. To lessen stress, try: Exercising regularly. Doing deep-breathing exercises. Doing yoga. Meditating. What benefits will I see if I quit smoking? Over  time, you should start to see positive results, such as: Improved sense of smell and taste. Decreased coughing and sore throat. Slower heart rate. Lower blood pressure. Clearer and healthier skin. The ability to breathe more easily. Fewer sick days. Summary Quitting smoking can be very challenging. Do not get discouraged if you are not successful the first time. Some people need to make many attempts to quit before they achieve long-term success. When you decide to quit smoking, create a plan to help you succeed. Quit smoking right away, not slowly over a period of time. Find resources and support systems that can help you quit smoking and remain smoke-free after you quit. This information is not intended to replace advice given to you by your health care provider. Make sure you discuss any questions you have with your health care provider. Document Revised: 05/15/2021 Document Reviewed: 05/15/2021 Elsevier Patient Education  [place]

## 2022-01-13 LAB — LIPID PANEL
Chol/HDL Ratio: 3.4 ratio (ref 0.0–5.0)
Cholesterol, Total: 114 mg/dL (ref 100–199)
HDL: 34 mg/dL — ABNORMAL LOW (ref >39)
LDL Chol Calc (NIH): 67 mg/dL (ref 0–99)
Triglycerides: 59 mg/dL (ref 0–149)
VLDL Cholesterol Cal: 13 mg/dL (ref 5–40)

## 2022-01-14 ENCOUNTER — Telehealth (HOSPITAL_COMMUNITY): Payer: Self-pay | Admitting: Licensed Clinical Social Worker

## 2022-01-14 NOTE — Telephone Encounter (Signed)
CSW received referral to assist with navigating placement for patient. CSW contacted daughter as requested and she states that she has applied for medicaid, has a completed FL2 from the PCP and is working with [facility]. She states that they currently do not have a bed available for patient. CSW encouraged her to continue to work with facility and return requested documents to [place] for medicaid completion. Daughter appears to be following all the right steps for placement for patient. Daughter verbalizes understanding and CSW available if needed. Raquel Sarna, [place], [place]

## 2022-01-28 DIAGNOSIS — N401 Enlarged prostate with lower urinary tract symptoms: Secondary | ICD-10-CM | POA: Diagnosis not present

## 2022-01-28 DIAGNOSIS — R310 Gross hematuria: Secondary | ICD-10-CM | POA: Diagnosis not present

## 2022-01-28 DIAGNOSIS — R3914 Feeling of incomplete bladder emptying: Secondary | ICD-10-CM | POA: Diagnosis not present

## 2022-02-12 ENCOUNTER — Ambulatory Visit: Payer: Self-pay | Admitting: Licensed Clinical Social Worker

## 2022-02-12 NOTE — Patient Outreach (Signed)
  Care Coordination   Follow Up Visit Note   02/12/2022 Name: [name] MRN: [id number] DOB: May 20, 1941  [name] is a 81 y.o. year old male who sees [name] Gilding, DO for primary care. I spoke with  [name] by phone today.  What matters to the patients health and wellness today?  Placement    Goals Addressed               This Visit's Progress     MSW Care Coordination (pt-stated)        Patient still seeking placement.  Patient is unwilling to stay with family.  Patients daughter continues to care for patient and search for placement.         SDOH assessments and interventions completed:  Yes     Care Coordination Interventions Activated:  Yes  Care Coordination Interventions:  Yes, provided   Follow up plan: Follow up call scheduled for next   90days.    Encounter Outcome:  Pt. Visit Completed   [name], MSW  Social Worker [facility]  [phone]

## 2022-04-27 ENCOUNTER — Ambulatory Visit (INDEPENDENT_AMBULATORY_CARE_PROVIDER_SITE_OTHER): Payer: Medicare PPO

## 2022-04-27 DIAGNOSIS — Z0000 Encounter for general adult medical examination without abnormal findings: Secondary | ICD-10-CM | POA: Diagnosis not present

## 2022-04-27 NOTE — Progress Notes (Signed)
Subjective:   [name] is a 81 y.o. male who presents for Medicare Annual/Subsequent preventive examination.  The patient consented to a virtual visit. Patient consented to have virtual visit and was identified by name and date of birth. Method of visit: Telephone  Encounter participants: Patient: [name] - located at Home Nurse/Provider: Dorna [name] - located at [facility] Others (if applicable): [name]- Daughter   Review of Systems: Defer to PCP  Cardiac Risk Factors include: diabetes mellitus;smoking/ tobacco exposure;dyslipidemia;male gender  Objective:    Vitals: There were no vitals taken for this visit.  There is no height or weight on file to calculate BMI.     04/30/2022    4:12 PM 04/27/2022    3:58 PM 11/23/2021   10:51 AM 11/17/2021    9:35 AM 09/22/2021    3:20 PM 02/12/2021    4:56 PM 10/09/2020    1:52 PM  Advanced Directives  Does Patient Have a Medical Advance Directive? No No No No No No No  Would patient like information on creating a medical advance directive?  No - Patient declined No - Patient declined No - Patient declined No - Patient declined No - Patient declined No - Patient declined   Tobacco Social History   Tobacco Use  Smoking Status Every Day   Packs/day: 1.00   Years: 30.00   Total pack years: 30.00   Types: Cigarettes   Passive exposure: Current  Smokeless Tobacco Current   Types: Chew   Last attempt to quit: 06/07/2001     Ready to quit: No Counseling given: Yes  Clinical Intake:  Pre-visit preparation completed: Yes  Pain Score: 0-No pain  Diabetes: Yes  What is the last grade level you completed in school?: High School  Interpreter Needed?: No  Past Medical History:  Diagnosis Date   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   Urticaria    Past Surgical History:  Procedure Laterality Date   ADENOIDECTOMY     CHOLECYSTECTOMY  1960   COLON SURGERY     right hemi-colectomy   COLONOSCOPY  multiple   ESOPHAGOGASTRODUODENOSCOPY     TONSILLECTOMY     Family History  Problem Relation Age of Onset   Diabetes Other        nephew   Colon cancer Neg Hx    Social History   Socioeconomic History   Marital status: Divorced    Spouse name: Not on file   Number of children: 3   Years of education: 12   Highest education level: High school graduate  Occupational History   Occupation: retired    Comment: Administrator   Tobacco Use  Smoking status: Every Day    Packs/day: 1.00    Years: 30.00    Total pack years: 30.00    Types: Cigarettes    Passive exposure: Current   Smokeless tobacco: Current    Types: Chew    Last attempt to quit: 06/07/2001  Vaping Use   Vaping Use: Never used  Substance and Sexual Activity   Alcohol use: No    Alcohol/week: 0.0 standard drinks of alcohol    Comment: quit about 10 years ago    Drug use: No   Sexual activity: Not Currently    Partners: Female  Other Topics Concern   Not on file  Social History Narrative   Lives in [place] alone   Hobbies: hangs out socially    Patient enjoys walking around the community to stay active.      Right handed   Caffeine: none       Social Information ( copy and paste under history) updated 10/07/20   patient lives alone  .  Retired Sports coach . Primary family support persons is daughter, [name]    Difficulty affording medication: No    Strengths:Supportive family/friends      Family Members: Recruitment consultant (Dgt), [name] (Son), [name] (Sister)      Basic Activities of Daily Living    Dressing:Self-care   Eating: Partial assistance receives Mom's Meals; able to cook basic things   Ambulation: Partial assistance uses a cane   Toileting: Partial assistance uses depends   Bathing: Self-care; daughter unsure or how well he is doing in this area       Instrumental Activities of Daily Living   Shopping: Partial assistance; daughter helps   House/Yard Work: Partial assistance; daughter helps   Administration of medications: Total assistance; Daughter sets up medication; calls or comes by to make sure he takes it   Finances: Partial assistance; will not allow daughter to help she is unsure how he is paying everything   Telephone: Partial assistance; has land line phone but usually doesn't answer it   Transportation: Total assistance; Daughter takes to all appointments       Caregivers in home: No   Social Determinants of Health   Financial Resource Strain: [place]  (04/27/2022)   Overall Financial Resource Strain (CARDIA)    Difficulty of Paying Living Expenses: Not hard at all  Food Insecurity: No Food Insecurity (04/27/2022)   Hunger Vital Sign    Worried About Running Out of Food in the Last Year: Never true    [place] in the Last Year: Never true  Transportation Needs: No Transportation Needs (04/27/2022)   PRAPARE - Hydrologist (Medical): No    Lack of Transportation (Non-Medical): No  Physical Activity: Inactive (04/27/2022)   Exercise Vital Sign    Days of Exercise per Week: 0 days    Minutes of Exercise per Session: 0 min  Stress: No Stress Concern Present (04/27/2022)   Jenn[place]    Feeling of Stress : Not at all  Social Connections: Moderately Isolated (04/27/2022)   Social Connection and Isolation Panel [NHANES]    Frequency of Communication with Friends and Family: Once a week    Frequency of Social Gatherings with Friends and Family: Twice a week    Attends Religious Services: Never    Marine scientist or Organizations: Yes    Attends Archivist Meetings: Never  Marital Status: Divorced   No facility-administered encounter medications on file as of 04/27/2022.   Outpatient Encounter Medications as of 04/27/2022  Medication Sig   aspirin 81 MG chewable tablet Chew 1 tablet (81 mg total) by mouth daily.   atorvastatin (LIPITOR) 80 MG tablet Take 1 tablet (80 mg total) by mouth at bedtime.   empagliflozin (JARDIANCE) 10 MG TABS tablet Take 1 tablet (10 mg total) by mouth daily before breakfast.   furosemide (LASIX) 40 MG tablet Take 0.5 tablets (20 mg total) by mouth daily.   metoprolol succinate (TOPROL-XL) 25 MG 24 hr tablet Take 1 tablet (25 mg total) by mouth daily.   tamsulosin (FLOMAX) 0.4 MG CAPS capsule Take 0.4 mg by mouth daily.   Activities of Daily Living    04/27/2022    3:59 PM  In your present state of health, do you have any difficulty performing the following activities:  Hearing? 1  Vision? 0  Difficulty concentrating or making decisions? 1  Walking or climbing stairs? 1  Dressing or bathing? 1  Doing errands, shopping? 1  Preparing Food and eating ? N  Using the Toilet? N  In the past six months, have you accidently leaked urine? Y  Do you have problems with loss of bowel control? N  Managing your Medications? N  Managing your Finances? N  Housekeeping or managing your Housekeeping? N   Patient Care Team: [name], DO as PCP - General (Family Medicine) Jerline Pain, MD as PCP - Cardiology (Cardiology) Kennith Gain, MD as Consulting Physician (Allergy) [name], RN as Case  Manager   Assessment:   This is a routine wellness examination for [facility].  Exercise Activities and Dietary recommendations Current Exercise Habits: The patient does not participate in regular exercise at present, Exercise limited by: cardiac condition(s)   Goals       Caregiver Coping Optimized      Patient Goals/Self-Care Activities:  Complete Guardianship process Apply for Medicaid      HEMOGLOBIN A1C < 7      A1c 5.6 on 09/22/2021.      MSW Care Coordination (pt-stated)      Patient still seeking placement.  Patient is unwilling to stay with family.  Patients daughter continues to care for patient and search for placement.        Fall Risk    04/27/2022    3:59 PM 11/23/2021   10:32 AM 09/22/2021    3:19 PM 02/12/2021    4:54 PM 10/10/2020    9:21 AM  Fall Risk   Falls in the past year? 0 0 0 0 0  Number falls in past yr: 0  0 0   Injury with Fall? 0   0   Risk for fall due to : Orthopedic patient;Impaired balance/gait Impaired mobility  Impaired mobility;Impaired balance/gait   Follow up  Falls prevention discussed  Falls evaluation completed    Patient denies any falls in the last year. He does report using a cane for balance.   Is the patient's home free of loose throw rugs in walkways, pet beds, electrical cords, etc?   yes      Grab bars in the bathroom? yes      Handrails on the stairs?   yes      Adequate lighting?   yes  Patient rating of health (0-10): 10  Depression Screen    04/27/2022    3:58 PM 11/23/2021   10:31 AM 09/22/2021  3:20 PM 02/12/2021    4:43 PM  PHQ 2/9 Scores  PHQ - 2 Score 0 0 0 0  PHQ- 9 Score  0 0    Cognitive Function    02/12/2021    4:49 PM 12/05/2018    9:46 AM  6CIT Screen  What Year? 4 points 0 points  What month? 0 points 0 points  What time? 0 points 0 points  Count back from 20 0 points 0 points  Months in reverse 0 points 0 points  Repeat phrase 10 points 0 points  Total Score 14 points 0 points   *Did not  perform cognitive function test today*  Immunization History  Administered Date(s) Administered   Fluad Quad(high Dose 65+) 07/18/2019   Influenza, High Dose Seasonal PF 03/23/2018   Influenza,inj,Quad PF,6+ Mos 04/13/2016, 04/13/2017   PPD Test 12/07/2021   Pneumococcal Conjugate-13 11/07/2017   Pneumococcal Polysaccharide-23 07/18/2019   Tdap 07/15/2017   Qualifies for Shingles Vaccine? Yes   Shingrix Completed: No, Education has been provided regarding the importance of this vaccine. Advised may receive this vaccine at local pharmacy or Health Dept. Aware to provide a copy of the vaccination record if obtained from local pharmacy or Health Dept. Verbalized acceptance and understanding.  Qualifies for Flu Vaccine? Yes   Influenza Completed: No, Education has been provided regarding the importance of this vaccine. Advised may receive this vaccine at local pharmacy or Health Dept. Aware to provide a copy of the vaccination record if obtained from local pharmacy or Health Dept. Verbalized acceptance and understanding.  Qualifies for Covid Vaccine? Yes   Shingrix Completed: No, Education has been provided regarding the importance of this vaccine. Advised may receive this vaccine at local pharmacy or Health Dept. Aware to provide a copy of the vaccination record if obtained from local pharmacy or Health Dept. Verbalized acceptance and understanding.  Screening Tests Health Maintenance  Topic Date Due   COVID-19 Vaccine (1) Never done   Zoster Vaccines- Shingrix (1 of 2) Never done   FOOT EXAM  11/08/2018   OPHTHALMOLOGY EXAM  11/10/2018   Diabetic kidney evaluation - Urine ACR  06/23/2019   INFLUENZA VACCINE  01/05/2022   Medicare Annual Wellness (AWV)  02/12/2022   HEMOGLOBIN A1C  10/30/2022   Diabetic kidney evaluation - GFR measurement  05/05/2023   Pneumonia Vaccine 56+ Years old  Completed   HPV VACCINES  Aged Out   Cancer Screenings: Lung: Low Dose CT Chest recommended if Age  68-80 years, 30 pack-year currently smoking OR have quit w/in 15years. Patient does qualify. Colorectal: 2017  Additional Screenings: Hepatitis C Screening: Completed  HIV Screening: Completed   Plan:  PCP apt scheduled for 06/29/2022 Consider vaccines we discussed.   I have personally reviewed and noted the following in the patient's chart:   Medical and social history Use of alcohol, tobacco or illicit drugs  Current medications and supplements Functional ability and status Nutritional status Physical activity Advanced directives List of other physicians Hospitalizations, surgeries, and ER visits in previous 12 months Vitals Screenings to include cognitive, depression, and falls Referrals and appointments  In addition, I have reviewed and discussed with patient certain preventive protocols, quality metrics, and best practice recommendations. A written personalized care plan for preventive services as well as general preventive health recommendations were provided to patient.  [name], [place]  05/04/2022

## 2022-04-30 ENCOUNTER — Emergency Department (HOSPITAL_COMMUNITY): Payer: Medicare PPO

## 2022-04-30 ENCOUNTER — Other Ambulatory Visit: Payer: Self-pay

## 2022-04-30 ENCOUNTER — Observation Stay (HOSPITAL_COMMUNITY)
Admission: EM | Admit: 2022-04-30 | Discharge: 2022-05-06 | Disposition: A | Discharge status: Skilled Nursing Facility | Payer: Medicare PPO | Attending: Family Medicine | Admitting: Family Medicine

## 2022-04-30 DIAGNOSIS — G9340 Encephalopathy, unspecified: Principal | ICD-10-CM | POA: Insufficient documentation

## 2022-04-30 DIAGNOSIS — Z1152 Encounter for screening for COVID-19: Secondary | ICD-10-CM | POA: Insufficient documentation

## 2022-04-30 DIAGNOSIS — Z7982 Long term (current) use of aspirin: Secondary | ICD-10-CM | POA: Insufficient documentation

## 2022-04-30 DIAGNOSIS — R9431 Abnormal electrocardiogram [ECG] [EKG]: Secondary | ICD-10-CM | POA: Diagnosis present

## 2022-04-30 DIAGNOSIS — F17210 Nicotine dependence, cigarettes, uncomplicated: Secondary | ICD-10-CM | POA: Diagnosis not present

## 2022-04-30 DIAGNOSIS — R2689 Other abnormalities of gait and mobility: Secondary | ICD-10-CM | POA: Insufficient documentation

## 2022-04-30 DIAGNOSIS — I4581 Long QT syndrome: Secondary | ICD-10-CM | POA: Insufficient documentation

## 2022-04-30 DIAGNOSIS — Z659 Problem related to unspecified psychosocial circumstances: Secondary | ICD-10-CM | POA: Diagnosis not present

## 2022-04-30 DIAGNOSIS — E162 Hypoglycemia, unspecified: Secondary | ICD-10-CM | POA: Diagnosis not present

## 2022-04-30 DIAGNOSIS — E875 Hyperkalemia: Secondary | ICD-10-CM | POA: Diagnosis not present

## 2022-04-30 DIAGNOSIS — R4182 Altered mental status, unspecified: Secondary | ICD-10-CM | POA: Insufficient documentation

## 2022-04-30 DIAGNOSIS — F0390 Unspecified dementia without behavioral disturbance: Secondary | ICD-10-CM | POA: Insufficient documentation

## 2022-04-30 DIAGNOSIS — M6281 Muscle weakness (generalized): Secondary | ICD-10-CM | POA: Diagnosis not present

## 2022-04-30 DIAGNOSIS — D649 Anemia, unspecified: Secondary | ICD-10-CM

## 2022-04-30 DIAGNOSIS — N179 Acute kidney failure, unspecified: Secondary | ICD-10-CM

## 2022-04-30 DIAGNOSIS — R7989 Other specified abnormal findings of blood chemistry: Secondary | ICD-10-CM | POA: Diagnosis not present

## 2022-04-30 DIAGNOSIS — R6883 Chills (without fever): Secondary | ICD-10-CM | POA: Diagnosis not present

## 2022-04-30 DIAGNOSIS — R8271 Bacteriuria: Secondary | ICD-10-CM | POA: Diagnosis present

## 2022-04-30 DIAGNOSIS — I110 Hypertensive heart disease with heart failure: Secondary | ICD-10-CM | POA: Insufficient documentation

## 2022-04-30 DIAGNOSIS — R262 Difficulty in walking, not elsewhere classified: Secondary | ICD-10-CM | POA: Insufficient documentation

## 2022-04-30 DIAGNOSIS — E119 Type 2 diabetes mellitus without complications: Secondary | ICD-10-CM | POA: Insufficient documentation

## 2022-04-30 DIAGNOSIS — E11649 Type 2 diabetes mellitus with hypoglycemia without coma: Secondary | ICD-10-CM | POA: Diagnosis not present

## 2022-04-30 DIAGNOSIS — I5020 Unspecified systolic (congestive) heart failure: Secondary | ICD-10-CM | POA: Insufficient documentation

## 2022-04-30 DIAGNOSIS — R2681 Unsteadiness on feet: Secondary | ICD-10-CM | POA: Insufficient documentation

## 2022-04-30 DIAGNOSIS — Z720 Tobacco use: Secondary | ICD-10-CM | POA: Diagnosis present

## 2022-04-30 DIAGNOSIS — Z79899 Other long term (current) drug therapy: Secondary | ICD-10-CM | POA: Diagnosis not present

## 2022-04-30 DIAGNOSIS — E8729 Other acidosis: Secondary | ICD-10-CM

## 2022-04-30 DIAGNOSIS — I255 Ischemic cardiomyopathy: Secondary | ICD-10-CM | POA: Diagnosis present

## 2022-04-30 DIAGNOSIS — E440 Moderate protein-calorie malnutrition: Secondary | ICD-10-CM | POA: Insufficient documentation

## 2022-04-30 DIAGNOSIS — E46 Unspecified protein-calorie malnutrition: Secondary | ICD-10-CM | POA: Insufficient documentation

## 2022-04-30 LAB — COMPREHENSIVE METABOLIC PANEL
ALT: 111 U/L — ABNORMAL HIGH (ref 0–44)
AST: 113 U/L — ABNORMAL HIGH (ref 15–41)
Albumin: 4.1 g/dL (ref 3.5–5.0)
Alkaline Phosphatase: 50 U/L (ref 38–126)
Anion gap: 17 — ABNORMAL HIGH (ref 5–15)
BUN: 45 mg/dL — ABNORMAL HIGH (ref 8–23)
CO2: 20 mmol/L — ABNORMAL LOW (ref 22–32)
Calcium: 10.4 mg/dL — ABNORMAL HIGH (ref 8.9–10.3)
Chloride: 102 mmol/L (ref 98–111)
Creatinine, Ser: 2.01 mg/dL — ABNORMAL HIGH (ref 0.61–1.24)
GFR, Estimated: 33 mL/min — ABNORMAL LOW (ref >60)
Glucose, Bld: 61 mg/dL — ABNORMAL LOW (ref 70–99)
Potassium: 5.4 mmol/L — ABNORMAL HIGH (ref 3.5–5.1)
Sodium: 139 mmol/L (ref 135–145)
Total Bilirubin: 1 mg/dL (ref 0.3–1.2)
Total Protein: 7.8 g/dL (ref 6.5–8.1)

## 2022-04-30 LAB — CBC WITH DIFFERENTIAL/PLATELET
Abs Immature Granulocytes: 0.03 10*3/uL (ref 0.00–0.07)
Basophils Absolute: 0.1 10*3/uL (ref 0.0–0.1)
Basophils Relative: 1 %
Eosinophils Absolute: 0.1 10*3/uL (ref 0.0–0.5)
Eosinophils Relative: 1 %
HCT: 38.6 % — ABNORMAL LOW (ref 39.0–52.0)
Hemoglobin: 11.9 g/dL — ABNORMAL LOW (ref 13.0–17.0)
Immature Granulocytes: 0 %
Lymphocytes Relative: 12 %
Lymphs Abs: 1.5 10*3/uL (ref 0.7–4.0)
MCH: 31.5 pg (ref 26.0–34.0)
MCHC: 30.8 g/dL (ref 30.0–36.0)
MCV: 102.1 fL — ABNORMAL HIGH (ref 80.0–100.0)
Monocytes Absolute: 1.3 10*3/uL — ABNORMAL HIGH (ref 0.1–1.0)
Monocytes Relative: 11 %
Neutro Abs: 9.2 10*3/uL — ABNORMAL HIGH (ref 1.7–7.7)
Neutrophils Relative %: 75 %
Platelets: 120 10*3/uL — ABNORMAL LOW (ref 150–400)
RBC: 3.78 MIL/uL — ABNORMAL LOW (ref 4.22–5.81)
RDW: 14.5 % (ref 11.5–15.5)
WBC: 12.1 10*3/uL — ABNORMAL HIGH (ref 4.0–10.5)
nRBC: 0 % (ref 0.0–0.2)

## 2022-04-30 LAB — RESP PANEL BY RT-PCR (FLU A&B, COVID) ARPGX2
Influenza A by PCR: NEGATIVE
Influenza B by PCR: NEGATIVE
SARS Coronavirus 2 by RT PCR: NEGATIVE

## 2022-04-30 LAB — CBG MONITORING, ED
Glucose-Capillary: 64 mg/dL — ABNORMAL LOW (ref 70–99)
Glucose-Capillary: 139 mg/dL — ABNORMAL HIGH (ref 70–99)

## 2022-04-30 LAB — ETHANOL: Alcohol, Ethyl (B): <10 mg/dL (ref <10)

## 2022-04-30 MED ORDER — ATORVASTATIN CALCIUM 80 MG PO TABS
80.0000 mg | ORAL_TABLET | Freq: Every day | ORAL | Status: DC
  Administered 2022-05-01 – 2022-05-05 (×6): 80 mg via ORAL
  Filled 2022-04-30 (×6): qty 1

## 2022-04-30 MED ORDER — SODIUM CHLORIDE 0.9 % IV BOLUS
1000.0000 mL | Freq: Once | INTRAVENOUS | Status: AC
  Administered 2022-04-30: 1000 mL via INTRAVENOUS

## 2022-04-30 MED ORDER — ASPIRIN 81 MG PO CHEW
81.0000 mg | CHEWABLE_TABLET | Freq: Every day | ORAL | Status: DC
  Administered 2022-05-01 – 2022-05-06 (×6): 81 mg via ORAL
  Filled 2022-04-30 (×6): qty 1

## 2022-04-30 MED ORDER — NICOTINE 7 MG/24HR TD PT24
7.0000 mg | MEDICATED_PATCH | Freq: Every day | TRANSDERMAL | Status: DC
  Administered 2022-05-01 – 2022-05-06 (×7): 7 mg via TRANSDERMAL
  Filled 2022-04-30 (×8): qty 1

## 2022-04-30 MED ORDER — DEXTROSE 50 % IV SOLN
50.0000 mL | Freq: Once | INTRAVENOUS | Status: AC
  Administered 2022-04-30: 50 mL via INTRAVENOUS
  Filled 2022-04-30: qty 50

## 2022-04-30 MED ORDER — METOPROLOL SUCCINATE ER 25 MG PO TB24
12.5000 mg | ORAL_TABLET | Freq: Every day | ORAL | Status: DC
  Administered 2022-05-02 – 2022-05-06 (×5): 12.5 mg via ORAL
  Filled 2022-04-30 (×6): qty 1

## 2022-04-30 MED ORDER — ENOXAPARIN SODIUM 30 MG/0.3ML IJ SOSY
30.0000 mg | PREFILLED_SYRINGE | INTRAMUSCULAR | Status: DC
  Administered 2022-05-01 – 2022-05-02 (×2): 30 mg via SUBCUTANEOUS
  Filled 2022-04-30 (×2): qty 0.3

## 2022-04-30 MED ORDER — TAMSULOSIN HCL 0.4 MG PO CAPS
0.4000 mg | ORAL_CAPSULE | Freq: Every day | ORAL | Status: DC
  Administered 2022-05-01 – 2022-05-06 (×6): 0.4 mg via ORAL
  Filled 2022-04-30 (×6): qty 1

## 2022-04-30 NOTE — ED Notes (Signed)
Called pt's daughter, [name],  who stated they have been looking for him and she is on her way here

## 2022-04-30 NOTE — ED Provider Triage Note (Addendum)
Emergency Medicine Provider Triage Evaluation Note  Real Cona , a 81 y.o. male  was evaluated in triage.  Patient found wandering by police.  He denies any acute medical needs.  States that he lives at the [facility].  Denies chest pain, shortness of breath, fever.  Denies lower extremity pain.  He states that it is 1991.  He knows his name and that he is at [facility] in [place].  Review of Systems  Positive: None Negative: Chest pain  Physical Exam  BP (!) 144/55 (BP Location: Right Arm)   Pulse 75   Resp 17   SpO2 99%  Gen:   Awake, no distress   Resp:  Normal effort  MSK:   Moves extremities without difficulty  Other:  Oriented to person and place, not oriented to time.  Medical Decision Making  Medically screening exam initiated at 4:13 PM.  Appropriate orders placed.  Fabricio Endsley was informed that the remainder of the evaluation will be completed by another provider, this initial triage assessment does not replace that evaluation, and the importance of remaining in the ED until their evaluation is complete.  5:35 PM patient's daughter was contacted and arrived to the emergency department.  She states that she does not feel that patient is currently at his baseline.  When he is more confused and starts to wander, typically he has an infection, usually a urinary tract infection.  She is concerned that that could be what is going on currently.  Labs, COVID testing, UA ordered.   Carlisle Cater, PA-C 04/30/22 1735

## 2022-04-30 NOTE — ED Notes (Signed)
Pt requests that wallet and personal belongings ( $61, card with phone number, social security card and checkbook) be given to security. Belongings given to [place] from security.

## 2022-04-30 NOTE — H&P (Addendum)
Hospital Admission History and Physical Service Pager: [phone]  Patient name: [name] Medical record number: [id number] Date of Birth: [birth year] Age: 81 y.o. Gender: male  Primary Care Provider: [name], DO Consultants: None Code Status: Full, which was confirmed with family if patient unable to confirm  Preferred Emergency Contact: [name] (Daughter) [phone] (Mobile)   Chief Complaint: Altered mental status  Assessment and Plan: [name] is a 81 y.o. male presenting with altered mental status. Differential for this patient's presentation of this includes encephalopathy secondary to hypoglycemia/AKI, vitamin B12 deficiency (CBC shows macrocytic anemia), infection (patient is afebrile and normal WBC), or head trauma (CT head no acute abnormality).  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  * Encephalopathy Patient has a history of dementia.  Per patient's daughter, the patient's baseline mentation includes limited conversations and partially completing ADLs.  It seems like patient had cognitive impairment at baseline, his mentation may be slightly worse compared to baseline.  Per ED note, patient was found wandering outside when police found him noticing he is very disoriented.  Today his name, did not know the place, date, situation.  Patient arrived to ED with glucose of 64 and creatinine of 2.01 (baseline ~1.5).  Possible etiologies include metabolic derangements, infection, or toxins. - Admit to FMTS, med/tele, attending Dr. Andria Frames - Delirium precautions - PT/OT - Fall precautions -Up with assistance - UA pending - UDS pending - Vitamin B12, vitamin B1, folate level pending - TOC consult for SNF placement  Increased anion gap metabolic acidosis On admission, anion gap of 17, CO2 20.  Etiology is unclear at this time,  potentially from lactic acidosis or toxin. - Consider obtaining lactic acid level - Morning CMP pending - Continue to monitor  Anemia Hemoglobin of 11.9 on admission.  MCV elevated at 102.1. Per chart review, chronic since 2016. Last colonoscopy on file was 2017. Patient has personal history of resected colonic adenoma. - Iron, TIBC, ferritin pending - Vitamin B12 lab pending  Elevated LFTs AST 113 and ALT 111 on admission.  Possible causes include alcohol and infection.  Patient's daughter notes that the patient does not drink alcohol. - Hep B and hep C levels pending - Morning CMP  Hypoglycemia CBG on admission was 64, likely due to poor PO intake.  In the ED, given 50 mL IV dextrose.  Repeat CBG shows 139. Most recent A1c in 08/2020 was 5.6 - no current diabetes meds on file.  - Monitor CBGs - Consider obtaining A1c  Prolonged Q-T interval on ECG QTc 519 on admission, potassium slightly elevated at 5.4. - Daily EKG until resolved - Avoid QTc prolonging medications - Magnesium pending  AKI (acute kidney injury) ([name]) Creatinine on admission was 2.01 which is similar to his previous hospital admission in June, 2023.  This is most likely due to volume loss.  Due to his HFrEF, monitor closely if administering fluids to avoid exacerbation. 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 Appears volume down on exam, likely due to poor PO intake. Do not believe he is in exacerbation at this time.  -Reduced home metoprolol from 25 to 12.5 mg as patient's pulse on admission is normal presumably without recent medications -Continuous cardiac monitoring -Caution to providing fluids to avoid exacerbation -  Holding home Lasix  and Jardiance in setting of AKI -- Continue atorvastatin 80 mg -- Start half dose metoprolol, 12.5 mg -- Low threshold for CXR to eval pulmonary  edema   Tobacco use Current daily smoker. Daughter does not know how many PPD. Will order nicotine patch 7 mg daily, adjust as necessary.   Hyperkalemia Potassium on admission was 5.4.  This may be associated with patient's AKI. - Morning CMP pending - Consider providing Lokelma if potassium remains elevated   Chronic medical conditions Hyperlipidemia-continue Lipitor BPH-continue Flomax CAD-continue aspirin DM-holding Jardiance in setting of AKI  FEN/GI: Pending swallow eval, if passed most likely transition to heart healthy VTE Prophylaxis: Lovenox  Disposition: Med telemetry  History of Present Illness:  [name] is a 81 y.o. male presenting with altered mental status.  Patient was unable to answer questions appropriately on exam.  Discussed collateral information with patient's daughter [name].  She reports last seeing the patient on Tuesday.  She feels at his baseline, the patient is able to state his name and where he lives at but has trouble saying the date.  She also feels he responds appropriately but keeps to himself most of the time. Daughter describes him as "grumpy", saying "that's just who he is".  Daughter reports he lives alone in an apartment only a couple minutes away from her. Daughter organizes meds in dispenser. There is no outside help coming in to assist with meals, bathing, or cleaning - daughter reports last time he had that was several years ago. She says he mostly eats processed food at home and has limited capability of taking showers/baths. He told her previously he is afraid to fall in the bath tub. Daughter thinks he takes "duck baths... if at all". Daughter reports he walks to the grocery store every day on his own to buy cigarettes. She knows he is a daily smoker but does not know how much he smokes.   She said this is a second time that the patient has wandered outside of his house.  She reports visiting the patient's home 2 days ago to  give him Thanksgiving food but when she came, he was not at his house.  She reports that she has been trying to find placement for the patient at a memory care facility but was not able to get paperwork in time for processing.  She denies recent medication changes for the patient.  In the ED, patient was given 50 mL IV dextrose for hypoglycemia and a 1L NS bolus after refusing PO intake.   Review Of Systems: Per HPI with the following additions: Unable to assess as patient was altered  Pertinent Past Medical History: BPH Diabetes Gout Hyperlipidemia Hypertension Thyroid disorder  Pertinent Past Surgical History: Adenoidectomy Cholecystectomy EGD, colonoscopy Tonsillectomy  Pertinent Social History: Tobacco use: Yes Alcohol use: Collateral denies Other Substance use: Collateral denies Lives alone  Pertinent Family History: Diabetes  Remainder reviewed in history tab.   Important Outpatient Medications: Aspirin, Lipitor, Jardiance, Lasix, Toprol, Flomax Remainder reviewed in medication history.   Objective: BP (!) 151/64   Pulse 61   Temp 98.1 F (36.7 C) (Oral)   Resp 11   SpO2 100%  Exam: General: No acute distress, well-appearing elderly male Cardiovascular: RRR.  No murmurs, rubs, gallops appreciated.  Exam is limited as patient was altered Respiratory: CTAB.  Normal effort Derm: Dry flaky skin on lower extremities Ext: no leg swelling to palpation, both feet inspected - no sores or  wounds found Neuro: A&O x 1, alert to person.  Did not know month/day/year, his age, location, or situation.  Was able to respond that he is feeling okay  Labs:  CBC BMET  Recent Labs  Lab 04/30/22 1735  WBC 12.1*  HGB 11.9*  HCT 38.6*  PLT 120*   Recent Labs  Lab 04/30/22 1818  NA 139  K 5.4*  CL 102  CO2 20*  BUN 45*  CREATININE 2.01*  GLUCOSE 61*  CALCIUM 10.4*    Pertinent additional labs: Alcohol<10 AST 113 ALT 111 MCV 102 CBG trend: 64> 139 post ulcer 50  mL IV dextrose   EKG: Qtc 519, possible A-fib with PVCs   Imaging Studies Performed:  Chest x-ray shows no active cardiopulmonary disease  CT shows chronic atrophic changes with no acute abnormality  Echo in 08/2020 shows EF 20 to 25%, left ventricle severely decreased function   Alesia Morin, MD 05/01/2022, 12:17 AM PGY-1, [place] Intern pager: [phone], text pages welcome Secure chat group [place]   Upper Level Addendum: I have seen and evaluated this patient along with Dr. Vergia Alberts and reviewed the above note, making necessary revisions as appropriate. I agree with the medical decision making and physical exam as noted above. Ezequiel Essex, MD PGY-3 [facility]

## 2022-04-30 NOTE — Assessment & Plan Note (Addendum)
A&O to self, city, and state, but not current location. SW has gotten Ship broker from SNF.  - Delirium precautions - Will d/c to [facility], daughter agrees.

## 2022-04-30 NOTE — Assessment & Plan Note (Deleted)
On admission, anion gap of 17, CO2 20.  Etiology is unclear at this time, potentially from lactic acidosis or toxin. - Consider obtaining lactic acid level - Morning CMP pending - Continue to monitor

## 2022-04-30 NOTE — Assessment & Plan Note (Addendum)
Cr seems to be near his baseline (1.3) at 1.38 today. Needs CT AP outpatient.  -  Continue to monitor I/Os d/t urinary retention -  Continue holding Jardiance and Lasix, until discharge

## 2022-04-30 NOTE — Progress Notes (Signed)
Family medicine teaching service will be admitting this patient. Our pager information can be located in the physician sticky notes, treatment team sticky notes, and the headers of all our official daily progress notes.   FAMILY MEDICINE TEACHING SERVICE Patient - Please contact intern pager [phone] or text page via website [email or web address] (login: [email or web address]) for questions regarding care. DO NOT page listed attending provider unless there is no answer from the number above.   Austine Kelsay, MD PGY-3, [facility] pager [phone]   

## 2022-04-30 NOTE — Assessment & Plan Note (Addendum)
HR improved into the 60-70s. No associated symptoms and is euvolemic on exam. - Continue reduced home dose metoprolol 12.5 mg. - Holding home Lasix  and Jardiance in setting of AKI and UTI until discharge - Continue atorvastatin 80 mg - Has allergy to ACEi and valsartan (swelling)

## 2022-04-30 NOTE — ED Notes (Signed)
Pt transported to CT via stretcher.  

## 2022-04-30 NOTE — Assessment & Plan Note (Deleted)
Potassium on admission was 5.4.  This may be associated with patient's AKI. - Morning CMP pending - Consider providing Lokelma if potassium remains elevated

## 2022-04-30 NOTE — Assessment & Plan Note (Addendum)
LFTs stable. Hep C non-reactive. - Resolved

## 2022-04-30 NOTE — ED Provider Notes (Signed)
[facility] EMERGENCY DEPARTMENT Provider Note   CSN: [id number] Arrival date & time: 04/30/22  1559     History  Chief Complaint  Patient presents with   Altered Mental Status    [name] is a 81 y.o. male hx of dementia, here presenting with altered mental status.  Patient was found outside wandering around.  Police found him and noticed that he is very disoriented.  He cannot tell me what he was doing outside.  Patient has not been eating and drinking much today.  Patient lives at home with his daughter.  The history is provided by the patient and the EMS personnel.       Home Medications Prior to Admission medications   Medication Sig Start Date End Date Taking? Authorizing Provider  aspirin 81 MG chewable tablet Chew 1 tablet (81 mg total) by mouth daily. 08/28/20   Shary Key, DO  atorvastatin (LIPITOR) 80 MG tablet Take 1 tablet (80 mg total) by mouth at bedtime. 01/12/22   Elgie Collard, PA-C  empagliflozin (JARDIANCE) 10 MG TABS tablet Take 1 tablet (10 mg total) by mouth daily before breakfast. 01/12/22   Elgie Collard, PA-C  furosemide (LASIX) 40 MG tablet Take 0.5 tablets (20 mg total) by mouth daily. 01/12/22   Elgie Collard, PA-C  metoprolol succinate (TOPROL-XL) 25 MG 24 hr tablet Take 1 tablet (25 mg total) by mouth daily. 01/12/22   Elgie Collard, PA-C  tamsulosin (FLOMAX) 0.4 MG CAPS capsule Take 0.4 mg by mouth daily. 12/15/21   [provider]      Allergies    Ace inhibitors and Valsartan    Review of Systems   Review of Systems  Psychiatric/Behavioral:  Positive for confusion.   All other systems reviewed and are negative.   Physical Exam Updated Vital Signs BP (!) 139/54   Pulse 71   Temp 98.1 F (36.7 C) (Oral)   Resp 16   SpO2 98%  Physical Exam Vitals and nursing note reviewed.  Constitutional:      Comments: Confused and chronically ill  HENT:     Head: Normocephalic.     Nose: Nose normal.      Mouth/Throat:     Mouth: Mucous membranes are dry.  Eyes:     Extraocular Movements: Extraocular movements intact.     Pupils: Pupils are equal, round, and reactive to light.  Cardiovascular:     Rate and Rhythm: Normal rate and regular rhythm.     Pulses: Normal pulses.     Heart sounds: Normal heart sounds.  Pulmonary:     Effort: Pulmonary effort is normal.     Breath sounds: Normal breath sounds.  Abdominal:     General: Abdomen is flat.     Palpations: Abdomen is soft.  Musculoskeletal:        General: Normal range of motion.     Cervical back: Normal range of motion and neck supple.  Skin:    General: Skin is warm.     Capillary Refill: Capillary refill takes less than 2 seconds.  Neurological:     Comments: ANO x 2.  Patient has no focal weakness.  No facial droop.  Psychiatric:     Comments: Slightly agitated     ED Results / Procedures / Treatments   Labs (all labs ordered are listed, but only abnormal results are displayed) Labs Reviewed  CBC WITH DIFFERENTIAL/PLATELET - Abnormal; Notable for the following components:  Result Value   WBC 12.1 (*)    RBC 3.78 (*)    Hemoglobin 11.9 (*)    HCT 38.6 (*)    MCV 102.1 (*)    Platelets 120 (*)    Neutro Abs 9.2 (*)    Monocytes Absolute 1.3 (*)    All other components within normal limits  COMPREHENSIVE METABOLIC PANEL - Abnormal; Notable for the following components:   Potassium 5.4 (*)    CO2 20 (*)    Glucose, Bld 61 (*)    BUN 45 (*)    Creatinine, Ser 2.01 (*)    Calcium 10.4 (*)    AST 113 (*)    ALT 111 (*)    GFR, Estimated 33 (*)    Anion gap 17 (*)    All other components within normal limits  CBG MONITORING, ED - Abnormal; Notable for the following components:   Glucose-Capillary 64 (*)    All other components within normal limits  RESP PANEL BY RT-PCR (FLU A&B, COVID) [id number]  URINE CULTURE  ETHANOL  URINALYSIS, ROUTINE W REFLEX MICROSCOPIC    EKG None  Radiology CT HEAD WO  CONTRAST (5MM)  Result Date: 04/30/2022 CLINICAL DATA:  Altered mental status EXAM: CT HEAD WITHOUT CONTRAST TECHNIQUE: Contiguous axial images were obtained from the base of the skull through the vertex without intravenous contrast. RADIATION DOSE REDUCTION: This exam was performed according to the departmental dose-optimization program which includes automated exposure control, adjustment of the mA and/or kV according to patient size and/or use of iterative reconstruction technique. COMPARISON:  07/31/2020 FINDINGS: Brain: No evidence of acute infarction, hemorrhage, hydrocephalus, extra-axial collection or mass lesion/mass effect. Mild atrophic changes are noted. Vascular: No hyperdense vessel or unexpected calcification. Skull: Normal. Negative for fracture or focal lesion. Sinuses/Orbits: Mucosal retention cyst is noted on the left in the maxillary antrum. Other: None IMPRESSION: Chronic atrophic changes without acute abnormality. Electronically Signed   By: Inez Catalina M.D.   On: 04/30/2022 20:14   DG Chest 2 View  Result Date: 04/30/2022 CLINICAL DATA:  Altered mental status. EXAM: CHEST - 2 VIEW COMPARISON:  August 23, 2020. FINDINGS: Stable cardiomediastinal silhouette. Both lungs are clear. The visualized skeletal structures are unremarkable. IMPRESSION: No active cardiopulmonary disease. Electronically Signed   By: Marijo Conception M.D.   On: 04/30/2022 18:48    Procedures Procedures    Medications Ordered in ED Medications  sodium chloride 0.9 % bolus 1,000 mL (1,000 mLs Intravenous New Bag/Given 04/30/22 2038)  dextrose 50 % solution 50 mL (50 mLs Intravenous Given 04/30/22 2036)    ED Course/ Medical Decision Making/ A&P                           Medical Decision Making [name] is a 81 y.o. male here presenting with altered mental status.  Patient was found wandering outside by police.  Consider electrolyte abnormality versus head bleed versus pneumonia.  Plan to get CBC  and CMP and CT head  8:43 PM Patient's glucose is 60.  Patient also has acute renal failure with creatinine of 2.  Patient's BUN is elevated.  This is consistent with dehydration.  Patient was offered some food and water and juice but he is not eating and drinking much.  Will give D50 and IV fluids.  Patient will be admitted for hypoglycemia, AKI and encephalopathy.   Problems Addressed: AKI (acute kidney injury) ([place]): acute illness or injury Encephalopathy:  acute illness or injury Hypoglycemia: acute illness or injury  Amount and/or Complexity of Data Reviewed Labs: ordered. Decision-making details documented in ED Course. Radiology: ordered and independent interpretation performed. Decision-making details documented in ED Course.  Risk Prescription drug management. Decision regarding hospitalization.    Final Clinical Impression(s) / ED Diagnoses Final diagnoses:  None    Rx / DC Orders ED Discharge Orders     None         Drenda Freeze, MD 04/30/22 2045

## 2022-04-30 NOTE — Assessment & Plan Note (Addendum)
QTc rose from 455 to 481. Will continue monitoring. - Daily EKG until resolved - Avoid QTc prolonging medications

## 2022-04-30 NOTE — Assessment & Plan Note (Deleted)
CBG on admission was 64, likely due to poor PO intake.  In the ED, given 50 mL IV dextrose.  Repeat CBG shows 139. Most recent A1c in 08/2020 was 5.6 - no current diabetes meds on file.  - Monitor CBGs - Consider obtaining A1c

## 2022-04-30 NOTE — ED Triage Notes (Signed)
Pt presents after being dropped off by [employer] when found wandering.  Pt knows she is at [facility] but is not aware of the month/year/holiday season.  Pt reports it is January.  No complaints of pain but did state " I want to see what's wrong"  Pt states he does live alone.

## 2022-04-30 NOTE — Social Work (Signed)
CSW spoke to the patients daughter, the daughter has been looking for the dad for three days. The dad was found wondering in the ED. The daughter needs help placing the dad. Daughter reports dad having medicaid. The doctor wants to do more testing for the patient than the patient will be DC. The daughter stated to call him when the patient is ready for DC.

## 2022-05-01 ENCOUNTER — Other Ambulatory Visit: Payer: Self-pay

## 2022-05-01 DIAGNOSIS — E162 Hypoglycemia, unspecified: Secondary | ICD-10-CM | POA: Diagnosis not present

## 2022-05-01 DIAGNOSIS — I4581 Long QT syndrome: Secondary | ICD-10-CM | POA: Diagnosis not present

## 2022-05-01 DIAGNOSIS — R7989 Other specified abnormal findings of blood chemistry: Secondary | ICD-10-CM | POA: Diagnosis not present

## 2022-05-01 DIAGNOSIS — F0390 Unspecified dementia without behavioral disturbance: Secondary | ICD-10-CM | POA: Diagnosis not present

## 2022-05-01 DIAGNOSIS — G9340 Encephalopathy, unspecified: Secondary | ICD-10-CM | POA: Diagnosis not present

## 2022-05-01 DIAGNOSIS — E119 Type 2 diabetes mellitus without complications: Secondary | ICD-10-CM | POA: Diagnosis not present

## 2022-05-01 DIAGNOSIS — Z720 Tobacco use: Secondary | ICD-10-CM | POA: Diagnosis present

## 2022-05-01 DIAGNOSIS — E875 Hyperkalemia: Secondary | ICD-10-CM | POA: Diagnosis not present

## 2022-05-01 DIAGNOSIS — N179 Acute kidney failure, unspecified: Secondary | ICD-10-CM | POA: Diagnosis not present

## 2022-05-01 DIAGNOSIS — I110 Hypertensive heart disease with heart failure: Secondary | ICD-10-CM | POA: Diagnosis not present

## 2022-05-01 DIAGNOSIS — Z659 Problem related to unspecified psychosocial circumstances: Secondary | ICD-10-CM

## 2022-05-01 DIAGNOSIS — E46 Unspecified protein-calorie malnutrition: Secondary | ICD-10-CM | POA: Insufficient documentation

## 2022-05-01 DIAGNOSIS — I5020 Unspecified systolic (congestive) heart failure: Secondary | ICD-10-CM | POA: Diagnosis not present

## 2022-05-01 DIAGNOSIS — Z7982 Long term (current) use of aspirin: Secondary | ICD-10-CM | POA: Diagnosis not present

## 2022-05-01 DIAGNOSIS — D649 Anemia, unspecified: Secondary | ICD-10-CM

## 2022-05-01 LAB — IRON AND TIBC
Iron: 31 ug/dL — ABNORMAL LOW (ref 45–182)
Saturation Ratios: 10 % — ABNORMAL LOW (ref 17.9–39.5)
TIBC: 326 ug/dL (ref 250–450)
UIBC: 295 ug/dL

## 2022-05-01 LAB — FOLATE: Folate: 15 ng/mL (ref >5.9)

## 2022-05-01 LAB — COMPREHENSIVE METABOLIC PANEL
ALT: 94 U/L — ABNORMAL HIGH (ref 0–44)
AST: 116 U/L — ABNORMAL HIGH (ref 15–41)
Albumin: 3.2 g/dL — ABNORMAL LOW (ref 3.5–5.0)
Alkaline Phosphatase: 41 U/L (ref 38–126)
Anion gap: 13 (ref 5–15)
BUN: 48 mg/dL — ABNORMAL HIGH (ref 8–23)
CO2: 20 mmol/L — ABNORMAL LOW (ref 22–32)
Calcium: 9.5 mg/dL (ref 8.9–10.3)
Chloride: 106 mmol/L (ref 98–111)
Creatinine, Ser: 1.91 mg/dL — ABNORMAL HIGH (ref 0.61–1.24)
GFR, Estimated: 35 mL/min — ABNORMAL LOW (ref >60)
Glucose, Bld: 61 mg/dL — ABNORMAL LOW (ref 70–99)
Potassium: 4.4 mmol/L (ref 3.5–5.1)
Sodium: 139 mmol/L (ref 135–145)
Total Bilirubin: 0.7 mg/dL (ref 0.3–1.2)
Total Protein: 6.4 g/dL — ABNORMAL LOW (ref 6.5–8.1)

## 2022-05-01 LAB — CBC
HCT: 32.9 % — ABNORMAL LOW (ref 39.0–52.0)
Hemoglobin: 10.8 g/dL — ABNORMAL LOW (ref 13.0–17.0)
MCH: 31.5 pg (ref 26.0–34.0)
MCHC: 32.8 g/dL (ref 30.0–36.0)
MCV: 95.9 fL (ref 80.0–100.0)
Platelets: 162 10*3/uL (ref 150–400)
RBC: 3.43 MIL/uL — ABNORMAL LOW (ref 4.22–5.81)
RDW: 14.3 % (ref 11.5–15.5)
WBC: 10.2 10*3/uL (ref 4.0–10.5)
nRBC: 0 % (ref 0.0–0.2)

## 2022-05-01 LAB — URINALYSIS, ROUTINE W REFLEX MICROSCOPIC
Bilirubin Urine: NEGATIVE
Glucose, UA: >=500 mg/dL — AB
Ketones, ur: NEGATIVE mg/dL
Nitrite: NEGATIVE
Protein, ur: NEGATIVE mg/dL
Specific Gravity, Urine: 1.013 (ref 1.005–1.030)
WBC, UA: >50 WBC/hpf — ABNORMAL HIGH (ref 0–5)
pH: 5 (ref 5.0–8.0)

## 2022-05-01 LAB — HEPATITIS B SURFACE ANTIGEN: Hepatitis B Surface Ag: NONREACTIVE

## 2022-05-01 LAB — MAGNESIUM: Magnesium: 2.4 mg/dL (ref 1.7–2.4)

## 2022-05-01 LAB — HEPATITIS B SURFACE ANTIBODY,QUALITATIVE: Hep B S Ab: NONREACTIVE

## 2022-05-01 LAB — VITAMIN B12: Vitamin B-12: 214 pg/mL (ref 180–914)

## 2022-05-01 LAB — HEPATITIS B CORE ANTIBODY, TOTAL: Hep B Core Total Ab: NONREACTIVE

## 2022-05-01 LAB — TSH: TSH: 0.789 u[IU]/mL (ref 0.350–4.500)

## 2022-05-01 LAB — FERRITIN: Ferritin: 90 ng/mL (ref 24–336)

## 2022-05-01 LAB — LACTIC ACID, PLASMA: Lactic Acid, Venous: 1.1 mmol/L (ref 0.5–1.9)

## 2022-05-01 MED ORDER — SODIUM CHLORIDE 0.9 % IV BOLUS
250.0000 mL | Freq: Four times a day (QID) | INTRAVENOUS | Status: DC
  Administered 2022-05-01 (×2): 250 mL via INTRAVENOUS

## 2022-05-01 MED ORDER — CEPHALEXIN 250 MG PO CAPS
250.0000 mg | ORAL_CAPSULE | Freq: Three times a day (TID) | ORAL | Status: DC
  Administered 2022-05-01 – 2022-05-02 (×2): 250 mg via ORAL
  Filled 2022-05-01 (×4): qty 1

## 2022-05-01 NOTE — Progress Notes (Signed)
FMTS Interim Progress Note  Spoke to daughter [name]) and granddaughter at bedside. Most of this history is already noted in H&P. Per daughter, she last saw pt Tuesday. Didn't talk to him Wednesday. Then couldn't find him Thursday for thanksgiving. Couldn't find him Friday either. She called maintenance at his apartment to check him and he wasn't home. Daughter spoke to neighbors and found out that pt had been brought home twice by police during the 3 days he was missing and he wandered off again (New information not already mentioned in H&P).   Pt lives in an apartment complex and they have been trying to get him into a memory care facility. I updated family that we are looking for SNF placement, they are agreeable to this plan.  Arlyce Dice, MD 05/01/2022, 3:26 PM PGY-1, [place] Medicine Service pager [phone]

## 2022-05-01 NOTE — Assessment & Plan Note (Addendum)
Patient is eating well. On Ensure PO BID. - Daily weights. Will monitor for weight loss.

## 2022-05-01 NOTE — TOC Initial Note (Signed)
Transition of Care [facility]) - Initial/Assessment Note    Patient Details  Name: [name] MRN: [id number] Date of Birth: August 25, 1940  [facility]) CM/SW Contact:    Bartholomew Crews, RN Phone Number: [phone] 05/01/2022, 3:36 PM  Clinical Narrative:                  Spoke with patient's daughter, [name], on her mobile phone to discuss post acute transition. PTA patient lived alone. [name] wants patient to be be in a memory care facility. Discussed process of getting into facility which is paid for by either private pay or long term Medicaid. Patient is recommended SNF for STR which will allow her time to find placement while he is getting rehab. Provided resources to assist with her search. Noted CSW has completed FL2 for SNF search. TOC following for transition needs.   Expected Discharge Plan: Skilled Nursing Facility Barriers to Discharge: Continued Medical Work up   Patient Goals and CMS Choice Patient states their goals for this hospitalization and ongoing recovery are:: rehab then memory care CMS Medicare.gov Compare Post Acute Care list provided to:: Patient Represenative (must comment) (daughter, [name]) Choice offered to / list presented to : Adult Children  Expected Discharge Plan and Services Expected Discharge Plan: [place] In-house Referral: Clinical Social Work Discharge Planning Services: CM Consult Post Acute Care Choice: [facility] Living arrangements for the past 2 months: Apartment                                      Prior Living Arrangements/Services Living arrangements for the past 2 months: Apartment Lives with:: Self Patient language and need for interpreter reviewed:: Yes        Need for Family Participation in Patient Care: Yes (Comment)     Criminal Activity/Legal Involvement Pertinent to Current Situation/Hospitalization: No - Comment as needed  Activities of Daily  Living      Permission Sought/Granted                  Emotional Assessment       Orientation: : Oriented to Self Alcohol / Substance Use: Not Applicable Psych Involvement: No (comment)  Admission diagnosis:  Hypoglycemia [E16.2] Encephalopathy [G93.40] AKI (acute kidney injury) ([place]) [N17.9] Patient Active Problem List   Diagnosis Date Noted   Anemia 05/01/2022   Tobacco use 05/01/2022   Malnutrition ([place]) 05/01/2022   Poor social situation 05/01/2022   Encephalopathy 04/30/2022   Elevated LFTs 04/30/2022   Gross hematuria    At high risk for elopement    Dehydration 11/17/2021   UTI (urinary tract infection) 11/17/2021   AKI (acute kidney injury) ([place]) 11/17/2021   Prolonged Q-T interval on ECG 11/17/2021   Hallux abductovalgus with bunions 10/10/2020   Hearing impairment 10/10/2020   Caregiver stress 10/10/2020   Balance problem 10/09/2020   Underweight 10/08/2020   Patient has difficulty administering medication 10/08/2020   Tobacco dependence    Aortic root dilatation (HCC)    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 ([place])    Chronic HFrEF (heart failure with reduced ejection fraction) ([place])    Lives alone    High risk social situation    Episodic tension-type headache, not intractable 12/07/2019   Weight loss 02/06/2019   B12 deficiency 07/21/2017   Osteoarthritis 05/12/2017   Hyperlipidemia associated with type 2 diabetes mellitus (  [place]) 12/20/2016   Dementia ([place]) 07/06/2016   Insomnia 01/09/2015   Type 2 diabetes mellitus with neurological complications ([place]) 70/48/8891   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

## 2022-05-01 NOTE — Evaluation (Signed)
SLP Cancellation Note  Patient Details Name: Story Vanvranken MRN: [id number] DOB: [birth year]   Cancelled treatment:       Reason Eval/Treat Not Completed: Other (comment) (swallow evaluation order received and then discontinued by MD, please reconsult if indicated)  Kathleen Lime, MS [facility] SLP [place] Office [phone] Pager [phone]  Macario Golds 05/01/2022, 10:28 AM

## 2022-05-01 NOTE — Care Management Obs Status (Signed)
[place] NOTIFICATION   Patient Details  Name: Treyshaun Keatts MRN: [id number] Date of Birth: 01/06/1941   Medicare Observation Status Notification Given:  Yes    Bartholomew Crews, RN 05/01/2022, 3:33 PM

## 2022-05-01 NOTE — Assessment & Plan Note (Addendum)
Iron labs not c/w IDA. Chart review reveals that pt is chronically anemic and this is baseline.  - Stable per baseline

## 2022-05-01 NOTE — Hospital Course (Addendum)
[name] is an 81 yo M w/ hx of dementia, hearing impairment, HFrEF 20%, dilated aortic root, HLD, T2DM (A1c 6.2), Mallory Weiss tear, goiter & hyperthyroid, gout, BPH, and tobacco use; that was admitted with AMS. His hospital course is outlined below:   AMS  Hx of Dementia Pt was found wondering the streets and confused by police, was missing for past 3 days. In ED, he was given fluids with little improvement to his mental status. Dementia labs were unremarkable for reversible causes.  Asymptomatic Bacteruria  Urinary Retention UA was positive for infection, but this seems to be common for him mostly likely due to his urinary retention. He was started treatment with Keflex, but was discontinued after one day due to not meeting treatment criteria for asymptomatic bacteruria. Patient was retaining > 300 mL so decided to do a foley catheter, but due to having blood clots this failed twice and a Coude catheter was inserted instead. Urology was consulted to evaluate patient and recommended continuing foley at SNF until hematuria has resolved. Urology follow up scheduled outpatient.   AKI  Hyperkalemia  Prolonged QTc Likely pre-renal in setting of dehydration. Was given gentle IV fluids for one day till 11/25. Flomax was continued due to his urine retention as well, monitored I&Os. His creatinine has improved to his baseline. His QTc has also been waxing and waning, but is downtrending upon discharge.   HFrEF  No volume overload on admission. Pt was brady to 40-50's, so metoprolol was initially held and then home dose was halved. His HR has improved to 60-70s and remains stable. Discharged on Lasix, Jardiance, and Metoprolol.

## 2022-05-01 NOTE — Progress Notes (Incomplete)
*** Change time Daily Progress Note Intern Pager: [phone]  Patient name: [name] Medical record number: [id number] Date of birth: 1940/10/09 Age: 81 y.o. Gender: male  Primary Care Provider: [name], DO Consultants: None Code Status: Full   Pt Overview and Major Events to Date:  11/24 Admitted   Assessment and Plan: [name] is a 81 y.o. male presenting with altered mental status.    Pertinent PMH/PSH includes dementia, hearing impairment, HFrEF 20%, dilated aortic root, HLD, T2DM (A1c 6.2), Mallory Weiss tear, goiter & hyperthyroid, gout, BPH, and tobacco use.   * Encephalopathy Pt now A&O to self and place (this appears to be baseline). Pt reports living at [facility], which appears to be a retirement community in [place]. B12, B1, folate, TSH, CT all unremarkable. Glucose resolved.  - UDS pending - Delirium precautions - Plan to contact APS. SW consult.  Anemia Iron labs not c/w IDA. Chart review reveals that pt is chronically anemic and this is baseline.  - AM CBC  Elevated LFTs LFTs stable.  Hep B labs negative. Also considering starvation as potential etiology. - f/u HepC lab - AM CMP  Prolonged Q-T interval on ECG Mg wnl. - Daily EKG until resolved - Avoid QTc prolonging medications  AKI (acute kidney injury) ([place]) Cr downtrending. Trace edema and lung exam unremarkable, overall euvolemic on exam. Can continue gentle IVF today, but low threshold to discontinue once POing.  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  - Reduced home dose metoprolol 12.5 mg - Continuous cardiac monitoring - Holding home Lasix  and Jardiance in setting of AKI -- Continue atorvastatin 80 mg -- Has allergy to ACEi and valsartan (swelling)  Tobacco use Current daily smoker. Daughter does not know how many PPD.  Will order nicotine patch 7 mg daily, adjust as necessary.   Malnutrition ([place] Alb low.  - passed bedside swallow. Encourage PO as tolerated. - Daily weights. Will monitor for weight loss.    Tolerating lowered home Metoprolol dosage, no brady episodes overnight ***   May need IVF but will still need to encourage PO intake   AKI Cr 1.91> ***. GFR 35 with UA indicative of possible UTI, unable to evaluate symptoms due to underlying dementia. Treating with Keflex modified for CrCl. Needs CT AP outpatient.      FEN/GI: *** PPx: *** Dispo:{FPTSDISOLIST:27587} {FPTSDISOTIME:27588}. Barriers include ***.   Subjective:  ***  Objective: Temp:  [98.5 F (36.9 C)-98.9 F (37.2 C)] 98.9 F (37.2 C) (11/25 1944) Pulse Rate:  [56-64] 61 (11/25 1944) Resp:  [11-18] 18 (11/25 1944) BP: (112-136)/(65-91) 125/65 (11/25 1944) SpO2:  [97 %-100 %] 99 % (11/25 1944) Weight:  [61.2 kg] 61.2 kg (11/25 2045) General: Alert in no apparent distress, no overt somnolence or lethargy  Heart: Regular rate and rhythm with no murmurs appreciated Lungs: CTA bilaterally, no wheezing Abdomen: Bowel sounds present, no abdominal pain Skin: Warm and dry Extremities: No lower extremity edema   Laboratory: Most recent CBC Lab Results  Component Value Date   WBC 10.2 05/01/2022   HGB 10.8 (L) 05/01/2022   HCT 32.9 (L) 05/01/2022   MCV 95.9 05/01/2022   PLT 162 05/01/2022   Most recent BMP    Latest Ref Rng & Units 05/01/2022    2:08 AM  BMP  Glucose 70 -  99 mg/dL 61   BUN 8 - 23 mg/dL 48   Creatinine 0.61 - 1.24 mg/dL 1.91   Sodium 135 - 145 mmol/L 139   Potassium 3.5 - 5.1 mmol/L 4.4   Chloride 98 - 111 mmol/L 106   CO2 22 - 32 mmol/L 20   Calcium 8.9 - 10.3 mg/dL 9.5     Other pertinent labs ***    [name] Emery, MD 05/01/2022, 10:10 PM  PGY-2, [place] Intern pager: [phone], text pages welcome Secure chat group [place]

## 2022-05-01 NOTE — Progress Notes (Addendum)
Daily Progress Note Intern Pager: [phone]  Patient name: [name] Medical record number: [id number] Date of birth: [birth year] Age: 81 y.o. Gender: male  Primary Care Provider: [name], DO Consultants: None Code Status: Full  Pt Overview and Major Events to Date:  11/24 Admitted  Assessment and Plan: [name] is a 81 y.o. male presenting with altered mental status.   Pertinent PMH/PSH includes dementia, hearing impairment, HFrEF 20%, dilated aortic root, HLD, T2DM (A1c 6.2), Mallory Weiss tear, goiter & hyperthyroid, gout, BPH, and tobacco use.    * Encephalopathy Pt now A&O to self and place (this appears to be baseline). Pt reports living at [facility], which appears to be a retirement community in [place]. B12, B1, folate, TSH, CT all unremarkable. Glucose resolved.  - UDS pending - Delirium precautions - Plan to contact APS. SW consult.  Anemia Iron labs not c/w IDA. Chart review reveals that pt is chronically anemic and this is baseline.  - AM CBC  Elevated LFTs LFTs stable.  Hep B labs negative. Also considering starvation as potential etiology. - f/u HepC lab - AM CMP  Prolonged Q-T interval on ECG Mg wnl. - Daily EKG until resolved - Avoid QTc prolonging medications  AKI (acute kidney injury) ([place]) Cr downtrending. Trace edema and lung exam unremarkable, overall euvolemic on exam. Can continue gentle IVF today, but low threshold to discontinue once POing.  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  - Reduced home dose metoprolol 12.5 mg - Continuous cardiac monitoring - Holding home Lasix  and Jardiance in setting of AKI -- Continue atorvastatin 80 mg -- Has allergy to ACEi and valsartan (swelling)  Tobacco use Current daily smoker. Daughter does not know how many PPD. Will order nicotine  patch 7 mg daily, adjust as necessary.   Malnutrition (HL[place] Alb low.  - passed bedside swallow. Encourage PO as tolerated. - Daily weights. Will monitor for weight loss.    FEN/GI: NPO until bedside swallow PPx: Lovenox Dispo:Pending PT recommendations  tomorrow. Barriers include PT eval.   Subjective:  Feels hungry this morning and wants to eat. He reports he doesn't know what happened before coming, just that he woke up and was at the hospital. He [facility]   Objective: Temp:  [98.1 F (36.7 C)-98.9 F (37.2 C)] 98.5 F (36.9 C) (11/25 0942) Pulse Rate:  [56-75] 63 (11/25 0942) Resp:  [11-24] 15 (11/25 0625) BP: (116-151)/(54-91) 133/78 (11/25 0942) SpO2:  [97 %-100 %] 98 % (11/25 0942) Physical Exam: General: Pleasant, alert, laying comfortably in bed.  NAD Neuro: PERRLA. A&O to self, place [place]. Not oriented to time ("May or April"). 5/5 symmetric strength is BL UE and LE.  Cardiovascular: RRR, no murmurs Respiratory: CTAB. Normal WOB on RA. Abdomen: Soft, nontender, nondistended. Normal BS Extremities: Trace pitting edema BL  Laboratory: Most recent CBC Lab Results  Component Value Date   WBC 10.2 05/01/2022   HGB 10.8 (L) 05/01/2022   HCT 32.9 (L) 05/01/2022   MCV 95.9 05/01/2022   PLT 162 05/01/2022   Most recent BMP    Latest Ref Rng & Units 05/01/2022    2:08 AM  BMP  Glucose 70 - 99 mg/dL 61   BUN 8 - 23 mg/dL 48  Creatinine 0.61 - 1.24 mg/dL 1.91   Sodium 135 - 145 mmol/L 139   Potassium 3.5 - 5.1 mmol/L 4.4   Chloride 98 - 111 mmol/L 106   CO2 22 - 32 mmol/L 20   Calcium 8.9 - 10.3 mg/dL 9.5    [name] Dice, MD 05/01/2022, 11:07 AM  PGY-1, [place] Intern pager: [phone], text pages welcome Secure chat group [place]

## 2022-05-01 NOTE — NC FL2 (Signed)
[place] LEVEL OF CARE SCREENING TOOL     IDENTIFICATION  Patient Name: [name] Birthdate: Aug 24, 1940 Sex: male Admission Date (Current Location): 04/30/2022  [facility] and [place] Number:  Herbalist and Address:  [facility] [place] [address] [place], [place] [address]      Provider Number: [id number]  Attending Physician Name and Address:  Zenia Resides, MD  Relative Name and Phone Number:       Current Level of Care: Hospital Recommended Level of Care: [place] Prior Approval Number:    Date Approved/Denied:   PASRR Number: [id number]  Discharge Plan: SNF    Current Diagnoses: Patient Active Problem List   Diagnosis Date Noted   Anemia 05/01/2022   Tobacco use 05/01/2022   Malnutrition ([place]) 05/01/2022   Poor social situation 05/01/2022   Encephalopathy 04/30/2022   Elevated LFTs 04/30/2022   Gross hematuria    At high risk for elopement    Dehydration 11/17/2021   UTI (urinary tract infection) 11/17/2021   AKI (acute kidney injury) ([place]) 11/17/2021   Prolonged Q-T interval on ECG 11/17/2021   Hallux abductovalgus with bunions 10/10/2020   Hearing impairment 10/10/2020   Caregiver stress 10/10/2020   Balance problem 10/09/2020   Underweight 10/08/2020   Patient has difficulty administering medication 10/08/2020   Tobacco dependence    Aortic root dilatation (HCC)    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)    Chronic HFrEF (heart failure with reduced ejection fraction) ([place])    Lives alone    High risk social situation    Episodic tension-type headache, not intractable 12/07/2019   Weight loss 02/06/2019   B12 deficiency 07/21/2017   Osteoarthritis 05/12/2017   Hyperlipidemia associated with type 2 diabetes mellitus ([place]) 12/20/2016   Dementia ([place]) 07/06/2016   Insomnia 01/09/2015   Type 2 diabetes mellitus with neurological complications  ([place]) 56/21/3086   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   Hypertension associated with diabetes ([name]) 04/21/2007   Chronic urticaria 04/21/2007    Orientation RESPIRATION BLADDER Height & Weight     Self  Normal Incontinent Weight:   Height:     BEHAVIORAL SYMPTOMS/MOOD NEUROLOGICAL BOWEL NUTRITION STATUS      Continent Diet (See dc summary)  AMBULATORY STATUS COMMUNICATION OF NEEDS Skin   Limited Assist Verbally Normal                       Personal Care Assistance Level of Assistance  Bathing, Feeding, Dressing Bathing Assistance: Maximum assistance Feeding assistance: Limited assistance Dressing Assistance: Limited assistance     Functional Limitations Info             SPECIAL CARE FACTORS FREQUENCY  PT (By licensed PT), OT (By licensed OT)     PT Frequency: 5x/week OT Frequency: 5x/week            Contractures Contractures Info: Not present    Additional Factors Info  Code Status, Allergies Code Status Info: Full Allergies Info: Ace Inhibitors, Valsartan           Current Medications (05/01/2022):  This is the current hospital active medication list Current Facility-Administered Medications  Medication Dose Route Frequency Provider Last Rate Last Admin   aspirin chewable tablet 81 mg  81 mg Oral Daily Coralyn Pear B, MD   81 mg at 05/01/22 1209   atorvastatin (LIPITOR)  tablet 80 mg  80 mg Oral QHS Coralyn Pear B, MD   80 mg at 05/01/22 0114   enoxaparin (LOVENOX) injection 30 mg  30 mg Subcutaneous Q24H Coralyn Pear B, MD   30 mg at 05/01/22 1210   metoprolol succinate (TOPROL-XL) 24 hr tablet 12.5 mg  12.5 mg Oral Daily Coralyn Pear B, MD       nicotine (NICODERM CQ - dosed in mg/24 hr) patch 7 mg  7 mg Transdermal Daily Ezequiel Essex, MD   7 mg at 05/01/22 1210   sodium chloride 0.9 % bolus 250 mL  250 mL Intravenous QID Ezequiel Essex, MD       tamsulosin [facility])  capsule 0.4 mg  0.4 mg Oral Daily Coralyn Pear B, MD   0.4 mg at 05/01/22 1209     Discharge Medications: Please see discharge summary for a list of discharge medications.  Relevant Imaging Results:  Relevant Lab Results:   Additional Information SSN [id number]  Benard Halsted, LCSW

## 2022-05-01 NOTE — Plan of Care (Signed)
  Problem: Clinical Measurements: Goal: Ability to maintain clinical measurements within normal limits will improve Outcome: Progressing   

## 2022-05-01 NOTE — Assessment & Plan Note (Signed)
Current daily smoker. Daughter does not know how many PPD. Will order nicotine patch 7 mg daily, adjust as necessary.

## 2022-05-01 NOTE — Evaluation (Signed)
Occupational Therapy Evaluation Patient Details Name: [name] MRN: [id number] DOB: [birth year] Today's Date: 05/01/2022   History of Present Illness 81 y.o. male presenting 04/30/22 with altered mental status. 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   Clinical Impression   Patient admitted for the diagnosis above.  PTA he lived alone, with occasional assist from family.  Patient seen prior by this OT, and recommending 24 hour assist as needed prior.  Unsure about the families ability to provide this, so SNF recommended for post acute rehab, and assist with ultimate disposition.  OT will follow in the acute setting and  LTC memory unit, or a similar facility that can provide the needed assist/oversight, is probably best.  Unclear if the patient would agree to anything but home.     Recommendations for follow up therapy are one component of a multi-disciplinary discharge planning process, led by the attending physician.  Recommendations may be updated based on patient status, additional functional criteria and insurance authorization.   Follow Up Recommendations  Skilled nursing-short term rehab (<3 hours/day)     Assistance Recommended at Discharge Frequent or constant Supervision/Assistance  Patient can return home with the following A little help with walking and/or transfers;A little help with bathing/dressing/bathroom;Assist for transportation;Direct supervision/assist for financial management;Direct supervision/assist for medications management    Functional Status Assessment  Patient has had a recent decline in their functional status and demonstrates the ability to make significant improvements in function in a reasonable and predictable amount of time.  Equipment Recommendations  BSC/3in1;Tub/shower bench    Recommendations for Other Services        Precautions / Restrictions Precautions Precautions: Fall Precaution Comments: poor safety and insight Restrictions Weight Bearing Restrictions: No      Mobility Bed Mobility   Bed Mobility: Supine to Sit, Sit to Supine     Supine to sit: Modified independent (Device/Increase time) Sit to supine: Modified independent (Device/Increase time)        Transfers Overall transfer level: Needs assistance Equipment used: 1 person hand held assist Transfers: Sit to/from Stand Sit to Stand: Min guard                  Balance Overall balance assessment: Needs assistance Sitting-balance support: Feet supported Sitting balance-Leahy Scale: Good     Standing balance support: Single extremity supported Standing balance-Leahy Scale: Poor                             ADL either performed or assessed with clinical judgement   ADL Overall ADL's : Needs assistance/impaired Eating/Feeding: NPO Eating/Feeding Details (indicate cue type and reason): awaiting bedside swallow eval Grooming: Wash/dry hands;Supervision/safety;Standing           Upper Body Dressing : Set up;Sitting   Lower Body Dressing: Min guard;Sit to/from stand;Minimal assistance   Toilet Transfer: Min Dentist;Ambulation                   Vision Patient Visual Report: No change from baseline       Perception     Praxis      Pertinent Vitals/Pain Pain Assessment Pain Assessment: No/denies pain     Hand Dominance Right   Extremity/Trunk Assessment Upper Extremity Assessment Upper Extremity Assessment: Overall WFL for tasks assessed   Lower Extremity Assessment Lower Extremity Assessment: Defer to  PT evaluation   Cervical / Trunk Assessment Cervical / Trunk Assessment: Normal   Communication Communication Communication: HOH   Cognition Arousal/Alertness: Awake/alert Behavior During Therapy: WFL for tasks assessed/performed Overall  Cognitive Status: History of cognitive impairments - at baseline Area of Impairment: Safety/judgement, Problem solving, Orientation, Memory                 Orientation Level: Person   Memory: Decreased short-term memory   Safety/Judgement: Decreased awareness of safety, Decreased awareness of deficits   Problem Solving: Slow processing, Requires verbal cues, Requires tactile cues       General Comments  VSS on RA    Exercises     Shoulder Instructions      Home Living Family/patient expects to be discharged to:: Private residence Living Arrangements: Alone Available Help at Discharge: Family;Available PRN/intermittently Type of Home: Apartment Home Access: Level entry     Home Layout: One level     Bathroom Shower/Tub: Teacher, early years/pre: Standard     Home Equipment: Cane - single point   Additional Comments: Information obtained from notes taken during this admission and prior admission. Patient is a poor historian, providing conflicting answers to questions about living situation and PLOF.      Prior Functioning/Environment Prior Level of Function : Patient poor historian/Family not available             Mobility Comments: From reports, pt uses cane intermittently, Denies falls, daughter checks on pt periodically. ADLs Comments: Patient performs his own ADL/iADL, family assists with bill payment.  Patient has assist for medications.  Family assists with community mobility.        OT Problem List: Impaired balance (sitting and/or standing);Decreased safety awareness;Decreased cognition      OT Treatment/Interventions: Self-care/ADL training;Therapeutic activities;Patient/family education;Balance training    OT Goals(Current goals can be found in the care plan section) Acute Rehab OT Goals Patient Stated Goal: Going home OT Goal Formulation: With patient Time For Goal Achievement: 05/14/22 Potential to Achieve Goals: Good ADL  Goals Pt Will Perform Grooming: with modified independence;standing Pt Will Perform Lower Body Dressing: with modified independence;sit to/from stand Pt Will Transfer to Toilet: with modified independence;ambulating;regular height toilet  OT Frequency: Min 2X/week    Co-evaluation              AM-PAC OT "6 Clicks" Daily Activity     Outcome Measure Help from another person eating meals?: None Help from another person taking care of personal grooming?: A Little Help from another person toileting, which includes using toliet, bedpan, or urinal?: A Little Help from another person bathing (including washing, rinsing, drying)?: A Little Help from another person to put on and taking off regular upper body clothing?: A Little Help from another person to put on and taking off regular lower body clothing?: A Little 6 Click Score: 19   End of Session Nurse Communication: Other (comment) (Patient wanting breakfast tray)  Activity Tolerance: Patient tolerated treatment well Patient left: in bed;with call bell/phone within reach;with bed alarm set  OT Visit Diagnosis: Unsteadiness on feet (R26.81);Other symptoms and signs involving cognitive function                Time: 9242-6834 OT Time Calculation (min): 14 min Charges:  OT General Charges $OT Visit: 1 Visit OT Evaluation $OT Eval Moderate Complexity: 1 Mod  05/01/2022  RP, OTR/L  Acute Rehabilitation Services  Office:  [phone]   Metta Clines 05/01/2022, 9:56 AM

## 2022-05-01 NOTE — Progress Notes (Signed)
Physical Therapy Evaluation Patient Details Name: [name] MRN: [id number] DOB: 02/17/1941 Today's Date: 05/01/2022  History of Present Illness  81 y.o. male presenting 04/30/22 with altered mental status. 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  Clinical Impression  Pt admitted with above diagnosis. Requires min assist for transfers and ambulation today, demonstrating unsteadiness and increased fall risk; reduced safety awareness and lacks insight of current deficits. Favor SNF level of care unless family can provide additional supervision due to confusion, balance deficits, and hx of wandering. Pt currently with functional limitations due to the deficits listed below (see PT Problem List). Pt will benefit from skilled PT to increase their independence and safety with mobility to allow discharge to the venue listed below.          Recommendations for follow up therapy are one component of a multi-disciplinary discharge planning process, led by the attending physician.  Recommendations may be updated based on patient status, additional functional criteria and insurance authorization.  Follow Up Recommendations Skilled nursing-short term rehab (<3 hours/day) Can patient physically be transported by private vehicle: Yes    Assistance Recommended at Discharge Frequent or constant Supervision/Assistance  Patient can return home with the following  A little help with walking and/or transfers;A little help with bathing/dressing/bathroom;Assistance with cooking/housework;Direct supervision/assist for medications management;Direct supervision/assist for financial management;Assist for transportation    Equipment Recommendations Other (comment) (TBD)  Recommendations for Other Services       Functional Status Assessment Patient has had a recent decline in their  functional status and demonstrates the ability to make significant improvements in function in a reasonable and predictable amount of time.     Precautions / Restrictions Precautions Precautions: Fall Restrictions Weight Bearing Restrictions: No      Mobility  Bed Mobility Overal bed mobility: Needs Assistance Bed Mobility: Supine to Sit     Supine to sit: Supervision     General bed mobility comments: Requires supervision for safety. Close to EOB, unaware of proximity.    Transfers Overall transfer level: Needs assistance Equipment used: 1 person hand held assist Transfers: Sit to/from Stand Sit to Stand: Min assist           General transfer comment: Min assist for balance to stand from bed. Also performed from toilet with min assist for boost, holding handrail.    Ambulation/Gait Ambulation/Gait assistance: Min assist Gait Distance (Feet): 18 Feet (x2) Assistive device: 1 person hand held assist Gait Pattern/deviations: Step-through pattern, Staggering left, Staggering right Gait velocity: slow Gait velocity interpretation: <1.31 ft/sec, indicative of household ambulator   General Gait Details: Unsteady, requires min assist for balance, reaching for furniture in room. Unaware of deficit.  Stairs            Wheelchair Mobility    Modified Rankin (Stroke Patients Only)       Balance Overall balance assessment: Needs assistance Sitting-balance support: No upper extremity supported, Feet supported Sitting balance-Leahy Scale: Fair     Standing balance support: Single extremity supported Standing balance-Leahy Scale: Poor                               Pertinent Vitals/Pain Pain Assessment Pain Assessment: No/denies pain    Home Living Family/patient expects to be discharged to:: Private residence Living Arrangements: Alone Available Help at Discharge: Family;Available PRN/intermittently Type of  Home: Apartment Home Access: Level  entry       Home Layout: One level Home Equipment: [place] - single point Additional Comments: Information obtained from notes taken during this admission and prior admission. Patient is a poor historian, providing conflicting answers to questions about living situation and PLOF.    Prior Function Prior Level of Function : Patient poor historian/Family not available             Mobility Comments: From reports, pt uses cane intermittently, Denies falls, daughter checks on pt periodically.       Hand Dominance   Dominant Hand: Right    Extremity/Trunk Assessment   Upper Extremity Assessment Upper Extremity Assessment: Defer to OT evaluation    Lower Extremity Assessment Lower Extremity Assessment: Generalized weakness       Communication   Communication: HOH  Cognition Arousal/Alertness: Awake/alert Behavior During Therapy: WFL for tasks assessed/performed Overall Cognitive Status: No family/caregiver present to determine baseline cognitive functioning (Hx of cognitive impairments)                                 General Comments: Disoriented to month/year - aware of location but unsure why he is in the hospital.        General Comments General comments (skin integrity, edema, etc.): HR in 60s    Exercises General Exercises - Lower Extremity Ankle Circles/Pumps: AROM, Both, 10 reps, Supine Quad Sets: Strengthening, Both, 10 reps, Supine   Assessment/Plan    PT Assessment Patient needs continued PT services  PT Problem List Decreased strength;Decreased activity tolerance;Decreased balance;Decreased mobility;Decreased coordination;Decreased cognition;Decreased knowledge of use of DME;Decreased safety awareness       PT Treatment Interventions DME instruction;Gait training;Functional mobility training;Therapeutic activities;Therapeutic exercise;Balance training;Neuromuscular re-education;Cognitive remediation;Patient/family education    PT Goals  (Current goals can be found in the Care Plan section)  Acute Rehab PT Goals Patient Stated Goal: none PT Goal Formulation: Patient unable to participate in goal setting Time For Goal Achievement: 05/07/22 Potential to Achieve Goals: Good    Frequency Min 3X/week     Co-evaluation               AM-PAC PT "6 Clicks" Mobility  Outcome Measure Help needed turning from your back to your side while in a flat bed without using bedrails?: A Little Help needed moving from lying on your back to sitting on the side of a flat bed without using bedrails?: A Little Help needed moving to and from a bed to a chair (including a wheelchair)?: A Little Help needed standing up from a chair using your arms (e.g., wheelchair or bedside chair)?: A Little Help needed to walk in hospital room?: A Little Help needed climbing 3-5 steps with a railing? : A Lot 6 Click Score: 17    End of Session Equipment Utilized During Treatment: Gait belt Activity Tolerance: Patient tolerated treatment well Patient left: in bed;with call bell/phone within reach;with bed alarm set (declines sitting in recliner)   PT Visit Diagnosis: Unsteadiness on feet (R26.81);Other abnormalities of gait and mobility (R26.89);Muscle weakness (generalized) (M62.81);Difficulty in walking, not elsewhere classified (R26.2)    Time: 0109-3235 PT Time Calculation (min) (ACUTE ONLY): 22 min   Charges:   PT Evaluation $PT Eval Low Complexity: 1 Low          Candie Mile, PT, DPT Physical Therapist Acute Rehabilitation Services [facility] Outpatient Rehabilitation Services  [facility] 05/01/2022, 9:29 AM

## 2022-05-02 DIAGNOSIS — I4581 Long QT syndrome: Secondary | ICD-10-CM | POA: Diagnosis not present

## 2022-05-02 DIAGNOSIS — E875 Hyperkalemia: Secondary | ICD-10-CM | POA: Diagnosis not present

## 2022-05-02 DIAGNOSIS — F0390 Unspecified dementia without behavioral disturbance: Secondary | ICD-10-CM | POA: Diagnosis not present

## 2022-05-02 DIAGNOSIS — R7989 Other specified abnormal findings of blood chemistry: Secondary | ICD-10-CM | POA: Diagnosis not present

## 2022-05-02 DIAGNOSIS — E119 Type 2 diabetes mellitus without complications: Secondary | ICD-10-CM | POA: Diagnosis not present

## 2022-05-02 DIAGNOSIS — Z7982 Long term (current) use of aspirin: Secondary | ICD-10-CM | POA: Diagnosis not present

## 2022-05-02 DIAGNOSIS — G9340 Encephalopathy, unspecified: Secondary | ICD-10-CM | POA: Diagnosis not present

## 2022-05-02 DIAGNOSIS — N179 Acute kidney failure, unspecified: Secondary | ICD-10-CM | POA: Diagnosis not present

## 2022-05-02 DIAGNOSIS — I5020 Unspecified systolic (congestive) heart failure: Secondary | ICD-10-CM | POA: Diagnosis not present

## 2022-05-02 DIAGNOSIS — Z659 Problem related to unspecified psychosocial circumstances: Secondary | ICD-10-CM | POA: Diagnosis not present

## 2022-05-02 DIAGNOSIS — I110 Hypertensive heart disease with heart failure: Secondary | ICD-10-CM | POA: Diagnosis not present

## 2022-05-02 DIAGNOSIS — E162 Hypoglycemia, unspecified: Secondary | ICD-10-CM | POA: Diagnosis not present

## 2022-05-02 LAB — COMPREHENSIVE METABOLIC PANEL
ALT: 89 U/L — ABNORMAL HIGH (ref 0–44)
AST: 117 U/L — ABNORMAL HIGH (ref 15–41)
Albumin: 2.8 g/dL — ABNORMAL LOW (ref 3.5–5.0)
Alkaline Phosphatase: 35 U/L — ABNORMAL LOW (ref 38–126)
Anion gap: 9 (ref 5–15)
BUN: 33 mg/dL — ABNORMAL HIGH (ref 8–23)
CO2: 20 mmol/L — ABNORMAL LOW (ref 22–32)
Calcium: 8.9 mg/dL (ref 8.9–10.3)
Chloride: 107 mmol/L (ref 98–111)
Creatinine, Ser: 1.37 mg/dL — ABNORMAL HIGH (ref 0.61–1.24)
GFR, Estimated: 52 mL/min — ABNORMAL LOW (ref >60)
Glucose, Bld: 104 mg/dL — ABNORMAL HIGH (ref 70–99)
Potassium: 4.6 mmol/L (ref 3.5–5.1)
Sodium: 136 mmol/L (ref 135–145)
Total Bilirubin: 0.8 mg/dL (ref 0.3–1.2)
Total Protein: 5.7 g/dL — ABNORMAL LOW (ref 6.5–8.1)

## 2022-05-02 LAB — RAPID URINE DRUG SCREEN, HOSP PERFORMED
Amphetamines: NOT DETECTED
Barbiturates: NOT DETECTED
Benzodiazepines: NOT DETECTED
Cocaine: NOT DETECTED
Opiates: NOT DETECTED
Tetrahydrocannabinol: NOT DETECTED

## 2022-05-02 LAB — HCV INTERPRETATION

## 2022-05-02 LAB — CBC
HCT: 30.7 % — ABNORMAL LOW (ref 39.0–52.0)
Hemoglobin: 10.2 g/dL — ABNORMAL LOW (ref 13.0–17.0)
MCH: 31.8 pg (ref 26.0–34.0)
MCHC: 33.2 g/dL (ref 30.0–36.0)
MCV: 95.6 fL (ref 80.0–100.0)
Platelets: 123 10*3/uL — ABNORMAL LOW (ref 150–400)
RBC: 3.21 MIL/uL — ABNORMAL LOW (ref 4.22–5.81)
RDW: 14.3 % (ref 11.5–15.5)
WBC: 6.4 10*3/uL (ref 4.0–10.5)
nRBC: 0 % (ref 0.0–0.2)

## 2022-05-02 LAB — HCV AB W REFLEX TO QUANT PCR: HCV Ab: NONREACTIVE

## 2022-05-02 LAB — GLUCOSE, CAPILLARY: Glucose-Capillary: 97 mg/dL (ref 70–99)

## 2022-05-02 MED ORDER — ENOXAPARIN SODIUM 40 MG/0.4ML IJ SOSY
40.0000 mg | PREFILLED_SYRINGE | INTRAMUSCULAR | Status: DC
  Administered 2022-05-03 – 2022-05-05 (×3): 40 mg via SUBCUTANEOUS
  Filled 2022-05-02 (×3): qty 0.4

## 2022-05-02 MED ORDER — CEPHALEXIN 250 MG PO CAPS
250.0000 mg | ORAL_CAPSULE | Freq: Three times a day (TID) | ORAL | Status: DC
  Administered 2022-05-02 – 2022-05-03 (×3): 250 mg via ORAL
  Filled 2022-05-02 (×4): qty 1

## 2022-05-02 NOTE — Plan of Care (Signed)
  Problem: Safety: Goal: Ability to remain free from injury will improve Outcome: Not Progressing   Problem: Activity: Goal: Risk for activity intolerance will decrease Outcome: Not Progressing   Problem: Clinical Measurements: Goal: Will remain free from infection Outcome: Not Progressing

## 2022-05-03 ENCOUNTER — Other Ambulatory Visit: Payer: Self-pay

## 2022-05-03 DIAGNOSIS — N179 Acute kidney failure, unspecified: Secondary | ICD-10-CM

## 2022-05-03 DIAGNOSIS — E875 Hyperkalemia: Secondary | ICD-10-CM | POA: Diagnosis not present

## 2022-05-03 DIAGNOSIS — Z7982 Long term (current) use of aspirin: Secondary | ICD-10-CM | POA: Diagnosis not present

## 2022-05-03 DIAGNOSIS — F0390 Unspecified dementia without behavioral disturbance: Secondary | ICD-10-CM | POA: Diagnosis not present

## 2022-05-03 DIAGNOSIS — I4581 Long QT syndrome: Secondary | ICD-10-CM | POA: Diagnosis not present

## 2022-05-03 DIAGNOSIS — I5020 Unspecified systolic (congestive) heart failure: Secondary | ICD-10-CM | POA: Diagnosis not present

## 2022-05-03 DIAGNOSIS — E119 Type 2 diabetes mellitus without complications: Secondary | ICD-10-CM | POA: Diagnosis not present

## 2022-05-03 DIAGNOSIS — G9340 Encephalopathy, unspecified: Principal | ICD-10-CM

## 2022-05-03 DIAGNOSIS — E440 Moderate protein-calorie malnutrition: Secondary | ICD-10-CM | POA: Insufficient documentation

## 2022-05-03 DIAGNOSIS — I110 Hypertensive heart disease with heart failure: Secondary | ICD-10-CM | POA: Diagnosis not present

## 2022-05-03 DIAGNOSIS — R7989 Other specified abnormal findings of blood chemistry: Secondary | ICD-10-CM | POA: Diagnosis not present

## 2022-05-03 LAB — CBC
HCT: 31.3 % — ABNORMAL LOW (ref 39.0–52.0)
Hemoglobin: 9.9 g/dL — ABNORMAL LOW (ref 13.0–17.0)
MCH: 30.9 pg (ref 26.0–34.0)
MCHC: 31.6 g/dL (ref 30.0–36.0)
MCV: 97.8 fL (ref 80.0–100.0)
Platelets: 142 10*3/uL — ABNORMAL LOW (ref 150–400)
RBC: 3.2 MIL/uL — ABNORMAL LOW (ref 4.22–5.81)
RDW: 14.2 % (ref 11.5–15.5)
WBC: 6.6 10*3/uL (ref 4.0–10.5)
nRBC: 0 % (ref 0.0–0.2)

## 2022-05-03 LAB — COMPREHENSIVE METABOLIC PANEL
ALT: 88 U/L — ABNORMAL HIGH (ref 0–44)
AST: 98 U/L — ABNORMAL HIGH (ref 15–41)
Albumin: 2.8 g/dL — ABNORMAL LOW (ref 3.5–5.0)
Alkaline Phosphatase: 40 U/L (ref 38–126)
Anion gap: 7 (ref 5–15)
BUN: 25 mg/dL — ABNORMAL HIGH (ref 8–23)
CO2: 25 mmol/L (ref 22–32)
Calcium: 9.5 mg/dL (ref 8.9–10.3)
Chloride: 108 mmol/L (ref 98–111)
Creatinine, Ser: 1.35 mg/dL — ABNORMAL HIGH (ref 0.61–1.24)
GFR, Estimated: 52 mL/min — ABNORMAL LOW (ref >60)
Glucose, Bld: 211 mg/dL — ABNORMAL HIGH (ref 70–99)
Potassium: 4.5 mmol/L (ref 3.5–5.1)
Sodium: 140 mmol/L (ref 135–145)
Total Bilirubin: 0.4 mg/dL (ref 0.3–1.2)
Total Protein: 5.8 g/dL — ABNORMAL LOW (ref 6.5–8.1)

## 2022-05-03 LAB — HEMOGLOBIN A1C
Hgb A1c MFr Bld: 6.3 % — ABNORMAL HIGH (ref 4.8–5.6)
Mean Plasma Glucose: 134 mg/dL

## 2022-05-03 LAB — VITAMIN B1: Vitamin B1 (Thiamine): 104.6 nmol/L (ref 66.5–200.0)

## 2022-05-03 LAB — URINE CULTURE: Culture: NO GROWTH

## 2022-05-03 LAB — GLUCOSE, CAPILLARY
Glucose-Capillary: 115 mg/dL — ABNORMAL HIGH (ref 70–99)
Glucose-Capillary: 162 mg/dL — ABNORMAL HIGH (ref 70–99)
Glucose-Capillary: 121 mg/dL — ABNORMAL HIGH (ref 70–99)

## 2022-05-03 MED ORDER — INSULIN ASPART 100 UNIT/ML IJ SOLN
0.0000 [IU] | Freq: Three times a day (TID) | INTRAMUSCULAR | Status: DC
  Administered 2022-05-03: 2 [IU] via SUBCUTANEOUS
  Administered 2022-05-04: 1 [IU] via SUBCUTANEOUS
  Administered 2022-05-05: 2 [IU] via SUBCUTANEOUS
  Administered 2022-05-05: 1 [IU] via SUBCUTANEOUS
  Administered 2022-05-05 – 2022-05-06 (×2): 2 [IU] via SUBCUTANEOUS

## 2022-05-03 MED ORDER — ENSURE ENLIVE PO LIQD
237.0000 mL | Freq: Two times a day (BID) | ORAL | Status: DC
  Administered 2022-05-04 – 2022-05-06 (×4): 237 mL via ORAL

## 2022-05-03 NOTE — Progress Notes (Addendum)
Daily Progress Note Intern Pager: [phone]  Patient name: [name] Medical record number: [id number] Date of birth: [birth year] Age: 81 y.o. Gender: male  Primary Care Provider: [name], DO Consultants: TOC Code Status: FULL  Pt Overview and Major Events to Date:  11/24 - admitted  Assessment and Plan: [name] is a 81 y.o. male admitted for altered mental status.  PMH/PSH of dementia, HFrEF 20%, dilated aortic root, HLD, T2DM (A1c 6.2), Mallory Weiss tear, hyperthyroidism/goiter, gout, BPH, and tobacco use.  * Encephalopathy A&O to self and place (appears to be baseline). UDS negative. No complaints this morning and no pain with urination. - Keflex dosing per CrCl 250 mg q8h (11/25 - 11/30) - Delirium precautions - SW following   Anemia Iron labs not c/w IDA. Chart review reveals that pt is chronically anemic and this is baseline.  - AM CBC  Elevated LFTs LFTs stable. Hep C non-reactive. - AM CMP  Prolonged Q-T interval on ECG QTc has improved to 455. Will continue monitoring. - Daily EKG until resolved - Avoid QTc prolonging medications  AKI (acute kidney injury) ([place]) Cr downtrending and has improved to 1.35. Overall euvolemic on exam. Fluids off today; assess PO and add as indicated. Needs CT AP outpatient.  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  - Continue reduced home dose metoprolol 12.5 mg - Holding home Lasix  and Jardiance in setting of AKI and UTI  - Continue atorvastatin 80 mg - Has allergy to ACEi and valsartan (swelling)  Tobacco use Current daily smoker. Daughter does not know how many PPD. Nicotine patch 7 mg daily, adjust as necessary.   Malnutrition ([place]) Alb low. Patient is eating well.  -  Daily weights. Will monitor for weight loss.   FEN/GI: DYS3 diet PPx: Lovenox Dispo: pending authorization. Barriers  include SNF  Subjective:  Patient is doing well today, has no complaints, and his mentation seems to have improved. Denies any urinary symptoms. Denied in/out catheter overnight.  Objective: Temp:  [98.7 F (37.1 C)-99.1 F (37.3 C)] 99.1 F (37.3 C) (11/27 0175) Pulse Rate:  [50-60] 52 (11/27 0614) Resp:  [18] 18 (11/27 0614) BP: (117-135)/(56-59) 117/56 (11/27 0614) SpO2:  [100 %] 100 % (11/27 0614) Weight:  [59.6 kg-60.2 kg] 59.6 kg (11/27 1025) Physical Exam: General: Alert, eating comfortably in bed. No overt somnolence or lethargy. Cardiovascular: RRR. No M/R/G Respiratory: CTAB. No wheezing or crackles. Abdomen: Soft, nontender, nondistended. Normal bowel sounds. Extremities: No LLE edema.  Laboratory: Most recent CBC Lab Results  Component Value Date   WBC 6.6 05/03/2022   HGB 9.9 (L) 05/03/2022   HCT 31.3 (L) 05/03/2022   MCV 97.8 05/03/2022   PLT 142 (L) 05/03/2022   Most recent BMP    Latest Ref Rng & Units 05/03/2022    2:06 AM  BMP  Glucose 70 - 99 mg/dL 211   BUN 8 - 23 mg/dL 25   Creatinine 0.61 - 1.24 mg/dL 1.35   Sodium 135 - 145 mmol/L 140   Potassium 3.5 - 5.1 mmol/L 4.5   Chloride 98 - 111 mmol/L 108   CO2 22 - 32 mmol/L 25   Calcium 8.9 - 10.3 mg/dL 9.5     Other pertinent labs: none  Imaging/Diagnostic Tests: - UDS negative  Britt Bolognese, Medical Student 05/03/2022, 9:08 AM  I was personally  present and performed or re-performed the history, physical exam and medical decision making activities of this service and have verified that the service and findings are accurately documented in the student's note. My edits are noted within the note above. Please also see attending's attestation.   Donney Dice, DO                  05/03/2022, 9:15 AM PGY-3, [place] Intern pager: [phone], text pages welcome Secure chat group [place]

## 2022-05-03 NOTE — Progress Notes (Signed)
OT Cancellation Note  Patient Details Name: [name] MRN: [id number] DOB: 16-May-1941   Cancelled Treatment:    Reason Eval/Treat Not Completed: Patient declined, no reason specified (Pt declined participating in OT treatment. States that he's just sick and he feels fine otherwise. States that his strength is good and he is able to move around like normal.)  Ailene Ravel, OTR/L,CBIS  Supplemental OT - [facility] and [facility]  05/03/2022, 10:43 AM

## 2022-05-03 NOTE — Progress Notes (Signed)
At 2217 informed Dr. Gwendolyn Lima that post void residual was 315 and that patient peed 250, ordered in and out but patient refused stating "for what? I will move in a short while again, let me think about it", updated MD Gwendolyn Lima.  2231- Per dr. Ezequiel Essex, defer in and out for now, will attempt post void again, informed them about incontinence and that post void done earlier was incidentally witnessed as patient was attempting to get out of bed.

## 2022-05-03 NOTE — TOC Progression Note (Addendum)
Transition of Care [facility]) - Progression Note    Patient Details  Name: [name] MRN: [id number] Date of Birth: 1941/01/10  Transition of Care [facility]) CM/SW [place], LCSW Phone Number: 05/03/2022, 10:06 AM  Clinical Narrative:    10am-CSW will present bed offers to patient's daughter as available.  12:48p-CSW spoke with patient's daughter. She reported patient does not have wandering behavior typically if he is around people so she is not sure if he needs memory care level, though she was told by DSS that he would have to be memory care level for his short term medicaid to be approved for ltc. CSW requested [place] to review referral; they do not believe he necessarily has to be memory care for medicaid to be approved.     4pm-[facility] able to accept patient and prefer him to be on their memory care side. CSW spoke with patient's daughter and she is in agreement with plan. CSW initiated insurance authorization process, Ref# [id number].   Expected Discharge Plan: [place] Barriers to Discharge: Continued Medical Work up  Expected Discharge Plan and Services Expected Discharge Plan: [place] In-house Referral: Clinical Social Work Discharge Planning Services: CM Consult Post Acute Care Choice: [place] Living arrangements for the past 2 months: Apartment                                       Social Determinants of Health (SDOH) Interventions    Readmission Risk Interventions     No data to display

## 2022-05-03 NOTE — Progress Notes (Signed)
Initial Nutrition Assessment  DOCUMENTATION CODES:   Non-severe (moderate) malnutrition in context of acute illness/injury  INTERVENTION:  - Add Ensure Enlive po BID, each supplement provides 350 kcal and 20 grams of protein.  NUTRITION DIAGNOSIS:   Moderate Malnutrition related to acute illness as evidenced by moderate fat depletion, moderate muscle depletion.  GOAL:   Patient will meet greater than or equal to 90% of their needs  MONITOR:   PO intake, Supplement acceptance  REASON FOR ASSESSMENT:   Consult Assessment of nutrition requirement/status  ASSESSMENT:   81 y.o. male admits related to AMS. PMH includes: BPH, diabetes, gout, HLD, HTN and thyroid disorder. Pt is currently receiving medical management for encephalopathy.  Meds include: sliding scale insulin, Labs reviewed: BUN/Crt elevated.   Pt reports that he has a good appetite and has been eating well since admission. Per record, pt ate 75-100% of his meals since admission. The pt is meeting his needs at this time. Pt agreed to try Ensure BID for extra calories and protein. Wts fluctuate per record, but overall fairly stable over the past year. RD will continue to monitor PO intakes.   NUTRITION - FOCUSED PHYSICAL EXAM:  Flowsheet Row Most Recent Value  Orbital Region Moderate depletion  Upper Arm Region Moderate depletion  Thoracic and Lumbar Region Moderate depletion  Buccal Region Moderate depletion  Temple Region Moderate depletion  Clavicle Bone Region Moderate depletion  Clavicle and Acromion Bone Region Moderate depletion  Scapular Bone Region Unable to assess  Dorsal Hand Moderate depletion  Patellar Region Unable to assess  Anterior Thigh Region Unable to assess  Posterior Calf Region Unable to assess  Edema (RD Assessment) None  Hair Reviewed  Eyes Reviewed  Mouth Reviewed  Skin Reviewed  Nails Reviewed       Diet Order:   Diet Order             DIET DYS 3 Room service appropriate?  Yes; Fluid consistency: Thin  Diet effective now                   EDUCATION NEEDS:   Not appropriate for education at this time  Skin:  Skin Assessment: Reviewed RN Assessment  Last BM:  05/03/22  Height:   Ht Readings from Last 1 Encounters:  05/01/22 6' 2.02" (1.88 m)    Weight:   Wt Readings from Last 1 Encounters:  05/03/22 59.6 kg    Ideal Body Weight:  86.4 kg  BMI:  Body mass index is 16.86 kg/m.  Estimated Nutritional Needs:   Kcal:  8811-0315 kcals  Protein:  90-105 gm  Fluid:  >/= 1.7 L  Thalia Bloodgood, RD, LDN, CNSC.

## 2022-05-03 NOTE — Plan of Care (Signed)
  Problem: Clinical Measurements: Goal: Will remain free from infection Outcome: Not Progressing   Problem: Safety: Goal: Ability to remain free from injury will improve Outcome: Not Progressing

## 2022-05-04 DIAGNOSIS — N179 Acute kidney failure, unspecified: Secondary | ICD-10-CM | POA: Diagnosis not present

## 2022-05-04 DIAGNOSIS — I110 Hypertensive heart disease with heart failure: Secondary | ICD-10-CM | POA: Diagnosis not present

## 2022-05-04 DIAGNOSIS — E119 Type 2 diabetes mellitus without complications: Secondary | ICD-10-CM | POA: Diagnosis not present

## 2022-05-04 DIAGNOSIS — I4581 Long QT syndrome: Secondary | ICD-10-CM | POA: Diagnosis not present

## 2022-05-04 DIAGNOSIS — I5020 Unspecified systolic (congestive) heart failure: Secondary | ICD-10-CM | POA: Diagnosis not present

## 2022-05-04 DIAGNOSIS — G9340 Encephalopathy, unspecified: Secondary | ICD-10-CM | POA: Diagnosis not present

## 2022-05-04 DIAGNOSIS — E875 Hyperkalemia: Secondary | ICD-10-CM | POA: Diagnosis not present

## 2022-05-04 DIAGNOSIS — R8271 Bacteriuria: Secondary | ICD-10-CM | POA: Diagnosis present

## 2022-05-04 DIAGNOSIS — R7989 Other specified abnormal findings of blood chemistry: Secondary | ICD-10-CM | POA: Diagnosis not present

## 2022-05-04 DIAGNOSIS — F0390 Unspecified dementia without behavioral disturbance: Secondary | ICD-10-CM | POA: Diagnosis not present

## 2022-05-04 DIAGNOSIS — Z7982 Long term (current) use of aspirin: Secondary | ICD-10-CM | POA: Diagnosis not present

## 2022-05-04 LAB — CBC
HCT: 32.4 % — ABNORMAL LOW (ref 39.0–52.0)
Hemoglobin: 10.5 g/dL — ABNORMAL LOW (ref 13.0–17.0)
MCH: 31.6 pg (ref 26.0–34.0)
MCHC: 32.4 g/dL (ref 30.0–36.0)
MCV: 97.6 fL (ref 80.0–100.0)
Platelets: 134 10*3/uL — ABNORMAL LOW (ref 150–400)
RBC: 3.32 MIL/uL — ABNORMAL LOW (ref 4.22–5.81)
RDW: 14.1 % (ref 11.5–15.5)
WBC: 7.7 10*3/uL (ref 4.0–10.5)
nRBC: 0 % (ref 0.0–0.2)

## 2022-05-04 LAB — BASIC METABOLIC PANEL
Anion gap: 6 (ref 5–15)
BUN: 19 mg/dL (ref 8–23)
CO2: 25 mmol/L (ref 22–32)
Calcium: 9.3 mg/dL (ref 8.9–10.3)
Chloride: 104 mmol/L (ref 98–111)
Creatinine, Ser: 1.12 mg/dL (ref 0.61–1.24)
GFR, Estimated: >60 mL/min (ref >60)
Glucose, Bld: 116 mg/dL — ABNORMAL HIGH (ref 70–99)
Potassium: 4.5 mmol/L (ref 3.5–5.1)
Sodium: 135 mmol/L (ref 135–145)

## 2022-05-04 LAB — GLUCOSE, CAPILLARY
Glucose-Capillary: 89 mg/dL (ref 70–99)
Glucose-Capillary: 100 mg/dL — ABNORMAL HIGH (ref 70–99)
Glucose-Capillary: 140 mg/dL — ABNORMAL HIGH (ref 70–99)

## 2022-05-04 MED ORDER — MELATONIN 3 MG PO TABS
3.0000 mg | ORAL_TABLET | Freq: Every day | ORAL | Status: DC
  Administered 2022-05-04 – 2022-05-05 (×2): 3 mg via ORAL
  Filled 2022-05-04 (×2): qty 1

## 2022-05-04 MED ORDER — LIDOCAINE HCL URETHRAL/MUCOSAL 2 % EX GEL
1.0000 | Freq: Once | CUTANEOUS | Status: AC
  Administered 2022-05-05: 1 via URETHRAL
  Filled 2022-05-04: qty 6

## 2022-05-04 NOTE — Progress Notes (Signed)
Mobility Specialist Progress Note:   05/04/22 1225  Mobility  Activity Stood at bedside (Refused ambulation)  Level of Assistance Contact guard assist, steadying assist  Assistive Device None  Activity Response Tolerated fair  Mobility Referral Yes  $Mobility charge 1 Mobility   Pt reluctant to participate in mobility session. Required encouragement, pt only agreeable to perform STS at this time. No physical assistance required, only close minG for safety. Pt refused ambulation at this time d/t his legs and body being cold, states he will "take a rain check" on walking. Pt back in bed with all needs met, bed alarm on.   Nelta Numbers Mobility Specialist Please contact via SecureChat or  Rehab office at [phone]

## 2022-05-04 NOTE — TOC Progression Note (Signed)
Transition of Care [facility]) - Progression Note    Patient Details  Name: [name] MRN: [id number] Date of Birth: 1940/12/07  Transition of Care [facility]) CM/SW Contact  Joanne Chars, LCSW Phone Number: 05/04/2022, 10:42 AM  Clinical Narrative:   SNF auth request still pending in [facility].    Expected Discharge Plan: [place] Barriers to Discharge: Continued Medical Work up  Expected Discharge Plan and Services Expected Discharge Plan: [place] In-house Referral: Clinical Social Work Discharge Planning Services: CM Consult Post Acute Care Choice: [place] Living arrangements for the past 2 months: Apartment                                       Social Determinants of Health (SDOH) Interventions    Readmission Risk Interventions     No data to display

## 2022-05-04 NOTE — Progress Notes (Signed)
Physical Therapy Treatment Patient Details Name: [name] MRN: [id number] DOB: 07-29-1940 Today's Date: 05/04/2022   History of Present Illness 81 y.o. male presenting 04/30/22 with altered mental status. 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    PT Comments    Making progress with physical therapy. Still limited by impaired balance and gait, and remains high fall risk due to poor safety awareness. Amb with min assist in hallway, holding rail and with therapist providing HHA. Frequent redirection. Patient will continue to benefit from skilled physical therapy services to further improve independence with functional mobility.    Recommendations for follow up therapy are one component of a multi-disciplinary discharge planning process, led by the attending physician.  Recommendations may be updated based on patient status, additional functional criteria and insurance authorization.  Follow Up Recommendations  Skilled nursing-short term rehab (<3 hours/day) Can patient physically be transported by private vehicle: Yes   Assistance Recommended at Discharge Frequent or constant Supervision/Assistance  Patient can return home with the following A little help with walking and/or transfers;A little help with bathing/dressing/bathroom;Assistance with cooking/housework;Direct supervision/assist for medications management;Direct supervision/assist for financial management;Assist for transportation   Equipment Recommendations  Other (comment) (TBD)    Recommendations for Other Services       Precautions / Restrictions Precautions Precautions: Fall Precaution Comments: poor safety and insight Restrictions Weight Bearing Restrictions: No     Mobility  Bed Mobility Overal bed mobility: Modified Independent                  Transfers Overall transfer  level: Needs assistance Equipment used: 1 person hand held assist Transfers: Sit to/from Stand Sit to Stand: Min assist           General transfer comment: Getting out of bed when PT entered room attempting to go to bathroom, alarm sounding on bed, pt unaware of attachment to purewick tube. Requried orienting, min assist for balance and safety, hand held support. Also required assist to rise from [facility].    Ambulation/Gait Ambulation/Gait assistance: Min assist Gait Distance (Feet): 95 Feet Assistive device: 1 person hand held assist Gait Pattern/deviations: Step-through pattern, Staggering left, Staggering right Gait velocity: slow Gait velocity interpretation: <1.31 ft/sec, indicative of household ambulator   General Gait Details: Hand held assist, pt requires redirecting. Holding rail in hallway with opposite hand. Min assist for balance and VC for direction through hallway and back to room. Unsteady without overt buckling but definite need for hands on assist at this time.   Stairs             Wheelchair Mobility    Modified Rankin (Stroke Patients Only)       Balance Overall balance assessment: Needs assistance Sitting-balance support: Feet supported Sitting balance-Leahy Scale: Good     Standing balance support: Single extremity supported Standing balance-Leahy Scale: Poor                              Cognition Arousal/Alertness: Awake/alert Behavior During Therapy: Impulsive Overall Cognitive Status: History of cognitive impairments - at baseline Area of Impairment: Safety/judgement, Problem solving, Orientation, Memory                 Orientation Level: Person   Memory: Decreased short-term memory   Safety/Judgement: Decreased awareness of safety, Decreased awareness of deficits   Problem Solving:  Slow processing, Requires verbal cues, Requires tactile cues General Comments: Per MD notes, pt seems to be at baseline now.         Exercises      General Comments General comments (skin integrity, edema, etc.): Nurse in room when PT entered, appears to have pulled his IV out, RN in room aware.      Pertinent Vitals/Pain Pain Assessment Pain Assessment: No/denies pain    Home Living                          Prior Function            PT Goals (current goals can now be found in the care plan section) Acute Rehab PT Goals Patient Stated Goal: none PT Goal Formulation: Patient unable to participate in goal setting Time For Goal Achievement: 05/07/22 Potential to Achieve Goals: Good Progress towards PT goals: Progressing toward goals    Frequency    Min 3X/week      PT Plan Current plan remains appropriate    Co-evaluation              AM-PAC PT "6 Clicks" Mobility   Outcome Measure  Help needed turning from your back to your side while in a flat bed without using bedrails?: A Little Help needed moving from lying on your back to sitting on the side of a flat bed without using bedrails?: A Little Help needed moving to and from a bed to a chair (including a wheelchair)?: A Little Help needed standing up from a chair using your arms (e.g., wheelchair or bedside chair)?: A Little Help needed to walk in hospital room?: A Little Help needed climbing 3-5 steps with a railing? : A Lot 6 Click Score: 17    End of Session Equipment Utilized During Treatment: Gait belt Activity Tolerance: Patient tolerated treatment well Patient left: in bed;with call bell/phone within reach;with bed alarm set   PT Visit Diagnosis: Unsteadiness on feet (R26.81);Other abnormalities of gait and mobility (R26.89);Muscle weakness (generalized) (M62.81);Difficulty in walking, not elsewhere classified (R26.2)     Time: 9480-1655 PT Time Calculation (min) (ACUTE ONLY): 12 min  Charges:  $Gait Training: 8-22 mins                     Candie Mile, PT, DPT Physical Therapist Acute Rehabilitation  Services [place]    Ellouise Newer 05/04/2022, 1:47 PM

## 2022-05-04 NOTE — Assessment & Plan Note (Addendum)
Patient has a history of chronic recurrent urine retention in the settings on BPH, and is on Flomax. Urology recommended continuing the foley until hematuria resolves, most likely from traumatic foley during hospitalization. Urine culture showed no growth. - Awaiting urology recs regarding d/c with foley d/t hematuria

## 2022-05-04 NOTE — Progress Notes (Signed)
I have reviewed this visit and agree with the documentation.   

## 2022-05-04 NOTE — Plan of Care (Signed)
  Problem: Health Behavior/Discharge Planning: Goal: Ability to manage health-related needs will improve Outcome: Progressing   Problem: Nutrition: Goal: Adequate nutrition will be maintained Outcome: Progressing   Problem: Safety: Goal: Ability to remain free from injury will improve Outcome: Progressing   

## 2022-05-04 NOTE — Progress Notes (Signed)
Called [place] with the [facility] cath insertion team on 6N to inform him about difficulty with placement of Foley due to patient's BPH and multiple bladder scan show urine retention. Mr. [name] said he will try to get to patient as he works down his list.  Alen Bleacher, MD PGY-2, [place] Medicine Resident  Please page [phone] with questions.

## 2022-05-04 NOTE — Progress Notes (Signed)
Coude catheter placed as ordered. Pt tolerated well. Trace pink-tinged penile discharge noted due to trauma as this was second attempt for foley placement. Darci Current, DO aware.

## 2022-05-04 NOTE — Patient Instructions (Addendum)
[name]  Thank you for taking time to come for your Medicare Wellness Visit. I appreciate your ongoing commitment to your health goals. Please review the following plan we discussed and let me know if I can assist you in the future.    These are the goals we discussed:   Goals       Caregiver Coping Optimized      Patient Goals/Self-Care Activities:  Complete Guardianship process Apply for Medicaid      HEMOGLOBIN A1C < 7      A1c 5.6 on 09/22/2021.      MSW Care Coordination (pt-stated)      Patient still seeking placement.  Patient is unwilling to stay with family.  Patients daughter continues to care for patient and search for placement.        We also discussed recommended health maintenance. As discussed, you are due for: Health Maintenance  Topic Date Due   COVID-19 Vaccine (1) Never done   Zoster Vaccines- Shingrix (1 of 2) Never done   FOOT EXAM  11/08/2018   OPHTHALMOLOGY EXAM  11/10/2018   Diabetic kidney evaluation - Urine ACR  06/23/2019   INFLUENZA VACCINE  01/05/2022   HEMOGLOBIN A1C  10/30/2022   Medicare Annual Wellness (AWV)  04/28/2023   Diabetic kidney evaluation - GFR measurement  05/05/2023   Pneumonia Vaccine 78+ Years old  Completed   HPV VACCINES  Aged Out   PCP apt scheduled for 06/29/2022 Consider vaccines we discussed.   We also discussed smoking cessation. Call 1800-QUIT-NOW for help with stopping smoking.  Preventive Care 88 Years and Older, Male Preventive care refers to lifestyle choices and visits with your health care provider that can promote health and wellness. Preventive care visits are also called wellness exams. What can I expect for my preventive care visit? Counseling During your preventive care visit, your health care provider may ask about your: Medical history, including: Past medical problems. Family medical history. History of falls. Current health, including: Emotional well-being. Home life and relationship  well-being. Sexual activity. Memory and ability to understand (cognition). Lifestyle, including: Alcohol, nicotine or tobacco, and drug use. Access to firearms. Diet, exercise, and sleep habits. Work and work Statistician. Sunscreen use. Safety issues such as seatbelt and bike helmet use. Physical exam Your health care provider will check your: Height and weight. These may be used to calculate your BMI (body mass index). BMI is a measurement that tells if you are at a healthy weight. Waist circumference. This measures the distance around your waistline. This measurement also tells if you are at a healthy weight and may help predict your risk of certain diseases, such as type 2 diabetes and high blood pressure. Heart rate and blood pressure. Body temperature. Skin for abnormal spots. What immunizations do I need?  Vaccines are usually given at various ages, according to a schedule. Your health care provider will recommend vaccines for you based on your age, medical history, and lifestyle or other factors, such as travel or where you work. What tests do I need? Screening Your health care provider may recommend screening tests for certain conditions. This may include: Lipid and cholesterol levels. Diabetes screening. This is done by checking your blood sugar (glucose) after you have not eaten for a while (fasting). Hepatitis C test. Hepatitis B test. HIV (human immunodeficiency virus) test. STI (sexually transmitted infection) testing, if you are at risk. Lung cancer screening. Colorectal cancer screening. Prostate cancer screening. Abdominal aortic aneurysm (AAA) screening. You  may need this if you are a current or former smoker. Talk with your health care provider about your test results, treatment options, and if necessary, the need for more tests. Follow these instructions at home: Eating and drinking  Eat a diet that includes fresh fruits and vegetables, whole grains, lean  protein, and low-fat dairy products. Limit your intake of foods with high amounts of sugar, saturated fats, and salt. Take vitamin and mineral supplements as recommended by your health care provider. Do not drink alcohol if your health care provider tells you not to drink. If you drink alcohol: Limit how much you have to 0-2 drinks a day. Know how much alcohol is in your drink. In the [place] one drink equals one 12 oz bottle of beer (355 mL), one 5 oz glass of wine (148 mL), or one 1 oz glass of hard liquor (44 mL). Lifestyle Brush your teeth every morning and night with fluoride toothpaste. Floss one time each day. Exercise for at least 30 minutes 5 or more days each week. Do not use any products that contain nicotine or tobacco. These products include cigarettes, chewing tobacco, and vaping devices, such as e-cigarettes. If you need help quitting, ask your health care provider. Do not use drugs. If you are sexually active, practice safe sex. Use a condom or other form of protection to prevent STIs. Take aspirin only as told by your health care provider. Make sure that you understand how much to take and what form to take. Work with your health care provider to find out whether it is safe and beneficial for you to take aspirin daily. Ask your health care provider if you need to take a cholesterol-lowering medicine (statin). Find healthy ways to manage stress, such as: Meditation, yoga, or listening to music. Journaling. Talking to a trusted person. Spending time with friends and family. Safety Always wear your seat belt while driving or riding in a vehicle. Do not drive: If you have been drinking alcohol. Do not ride with someone who has been drinking. When you are tired or distracted. While texting. If you have been using any mind-altering substances or drugs. Wear a helmet and other protective equipment during sports activities. If you have firearms in your house, make sure you follow  all gun safety procedures. Minimize exposure to UV radiation to reduce your risk of skin cancer. What's next? Visit your health care provider once a year for an annual wellness visit. Ask your health care provider how often you should have your eyes and teeth checked. Stay up to date on all vaccines. This information is not intended to replace advice given to you by your health care provider. Make sure you discuss any questions you have with your health care provider. Document Revised: 11/19/2020 Document Reviewed: 11/19/2020 Elsevier Patient Education  [place] for Falls Each year, millions of people have serious injuries from falls. It is important to understand your risk for falling. Talk with your health care provider about your risk and what you can do to lower it. There are actions you can take at home to lower your risk and prevent falls. If you do have a serious fall, make sure to tell your health care provider. Falling once raises your risk of falling again. How can falls affect me? Serious injuries from falls are common. These include: Broken bones, such as hip fractures. Head injuries, such as traumatic brain injuries (TBI) or concussion. A fear of falling can  cause you to avoid activities and stay at home. This can make your muscles weaker and actually raise your risk for a fall. What can increase my risk? There are a number of risk factors that increase your risk for falling. The more risk factors you have, the higher your risk of falling. Serious injuries from a fall happen most often to people older than age 19. Children and young adults ages 69-29 are also at higher risk. Common risk factors include: Weakness in the lower body. Lack (deficiency) of vitamin D. Being generally weak or confused due to long-term (chronic) illness. Dizziness or balance problems. Poor vision. Medicines that cause dizziness or drowsiness. These can include  medicines for your blood pressure, heart, anxiety, insomnia, or edema, as well as pain medicines and muscle relaxants. Other risk factors include: Drinking alcohol. Having had a fall in the past. Having depression. Having foot pain or wearing improper footwear. Working at a dangerous job. Having any of the following in your home: Tripping hazards, such as floor clutter or loose rugs. Poor lighting. Pets. Having dementia or memory loss. What actions can I take to lower my risk of falling?     Physical activity Maintain physical fitness. Do strength and balance exercises. Consider taking a regular class to build strength and balance. Yoga and tai chi are good options. Vision Have your eyes checked every year and your vision prescription updated as needed. Walking aids and footwear Wear nonskid shoes. Do not wear high heels. Do not walk around the house in socks or slippers. Use a cane or walker as told by your health care provider. Home safety Attach secure railings on both sides of your stairs. Install grab bars for your tub, shower, and toilet. Use a bath mat in your tub or shower. Use good lighting in all rooms. Keep a flashlight near your bed. Make sure there is a clear path from your bed to the bathroom. Use night-lights. Do not use throw rugs. Make sure all carpeting is taped or tacked down securely. Remove all clutter from walkways and stairways, including extension cords. Repair uneven or broken steps. Avoid walking on icy or slippery surfaces. Walk on the grass instead of on icy or slick sidewalks. Use ice melt to get rid of ice on walkways. Use a cordless phone. Questions to ask your health care provider Can you help me check my risk for a fall? Do any of my medicines make me more likely to fall? Should I take a vitamin D supplement? What exercises can I do to improve my strength and balance? Should I make an appointment to have my vision checked? Do I need a bone  density test to check for weak bones or osteoporosis? Would it help to use a cane or a walker? Where to find more information Centers for Disease Control and Prevention, STEAD[employer]: http://www.wolf.info/ Community-Based Fall Prevention Programs: http://www.wolf.info/ National Institute on Aging: http://kim-miller.com/ Contact a health care provider if: You fall at home. You are afraid of falling at home. You feel weak, drowsy, or dizzy. Summary Serious injuries from a fall happen most often to people older than age 2. Children and young adults ages 75-29 are also at higher risk. Talk with your health care provider about your risks for falling and how to lower those risks. Taking certain precautions at home can lower your risk for falling. If you fall, always tell your health care provider. This information is not intended to replace advice given to you by your health  care provider. Make sure you discuss any questions you have with your health care provider. Document Revised: 12/26/2019 Document Reviewed: 12/26/2019 Elsevier Patient Education  [place] clinic's number is [phone]. Please call with questions or concerns about what we discussed today.

## 2022-05-04 NOTE — Progress Notes (Addendum)
Daily Progress Note Intern Pager: [phone]  Patient name: [name] Medical record number: [id number] Date of birth: 1940-12-07 Age: 81 y.o. Gender: male  Primary Care Provider: [name], DO Consultants: TOC, Dietician Code Status: FULL  Pt Overview and Major Events to Date:  11/24 - Admitted  Assessment and Plan: [name] is a 81 y.o. male admitted for altered mental status.  PMH/PSH of dementia, HFrEF 20%, dilated aortic root, HLD, T2DM (A1c 6.2), Mallory Weiss tear, hyperthyroidism/goiter, gout, BPH, and tobacco use.   * Encephalopathy A&O to self and place. Patient recalls that he has been here for days, but does not know the reason that brought him in to the hospital. SW following and discussed with family regarding patient going to memory care at [facility] and the daughter is in agreement with the plan.  - Delirium precautions - SW following and recommended memory care at [facility], daughter agrees.  Asymptomatic bacteriuria Patient has a history of chronic recurrent urine retention in the settings on BPH, and is on Flomax. Using McGeer's criteria, there is no indication for antibiotic treatment and were discontinued. - Urine culture is pending  Prolonged Q-T interval on ECG QTc rose from 455 to 481. Will continue monitoring. - Daily EKG until resolved - Avoid QTc prolonging medications  AKI (acute kidney injury) ([place]) Cr downtrending and has improved to 1.12. Overall euvolemic on exam. Needs CT AP outpatient.  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 Overnight HR was low and metoprolol was considered to be either halved or taken off. - Continue reduced home dose metoprolol 12.5 mg. Weigh risk vs. benefit to keep on or remove d/t HF. Will consider stopping metoprolol if HR drops below normal for him. - Holding home Lasix  and Jardiance in  setting of AKI and UTI  - Continue atorvastatin 80 mg - Has allergy to ACEi and valsartan (swelling)  Tobacco use Current daily smoker. Daughter does not know how many PPD. Nicotine patch 7 mg daily, adjust as necessary.   Anemia Iron labs not c/w IDA. Chart review reveals that pt is chronically anemic and this is baseline.  - AM CBC  Elevated LFTs LFTs stable. Hep C non-reactive. - AM CMP  Malnutrition (HCC) Alb low. Patient is eating well.  - Dietician was consulted and recommended Ensure PO BID for more calories and protein. - Daily weights. Will monitor for weight loss.   FEN/GI: DYS3 diet PPx: Lovenox Dispo: [place]  Subjective:  Overnight the patient had some low heart rates. This morning he shares that he doesn't feel well, but can't describe what he is feeling. Patient reports having some chills, but no fever, chest pain, or shortness of breath. He is unable to share what brought him into the hospital and has difficulty answering many questions related to his hospitalization or how he is doing.   Objective: Temp:  [98.2 F (36.8 C)-98.9 F (37.2 C)] 98.5 F (36.9 C) (11/28 0923) Pulse Rate:  [56-60] 56 (11/28 0923) Resp:  [18] 18 (11/28 0539) BP: (125-139)/(45-61) 130/59 (11/28 0923) SpO2:  [99 %-100 %] 99 % (11/28 0923) Weight:  [61.5 kg] 61.5 kg (11/28 0539) Physical Exam: General: Alert. NAD. Cardiovascular: RRR. No M/R/G Respiratory: CTAB. No wheezing or crackles. Abdomen: Soft, nontender, nondistended. Normal bowel sounds. Extremities: No LLE edema Neuro: A&O to self and place.  Laboratory: Most recent CBC Lab Results  Component Value Date   WBC 7.7 05/04/2022   HGB 10.5 (L) 05/04/2022   HCT 32.4 (L) 05/04/2022   MCV 97.6 05/04/2022   PLT 134 (L) 05/04/2022   Most recent BMP    Latest Ref Rng & Units 05/04/2022    2:39 AM  BMP  Glucose 70 - 99 mg/dL 116   BUN 8 - 23 mg/dL 19   Creatinine 0.61 - 1.24 mg/dL 1.12   Sodium 135 - 145 mmol/L  135   Potassium 3.5 - 5.1 mmol/L 4.5   Chloride 98 - 111 mmol/L 104   CO2 22 - 32 mmol/L 25   Calcium 8.9 - 10.3 mg/dL 9.3     Other pertinent labs  Albumin - 2.8  Imaging/Diagnostic Tests: EKG - 11/28 - Rate - 70 - QTc - 481 - sinus bradycardia  Britt Bolognese, Medical Student 05/04/2022, 9:40 AM  I was personally present and re-performed the exam. I verified the service and findings are accurately documented in the student's note. Alen Bleacher, MD 05/04/2022 1:02 PM  [place] Intern pager: [phone], text pages welcome Secure chat group [place]

## 2022-05-04 NOTE — Progress Notes (Addendum)
FMTS Brief Progress Note  S: Patient resting peacefully in bed.  Asleep when seen by me and Dr. Owens Shark.  When awakened patient did not have any complaints.   O: BP 138/68 (BP Location: Left Arm)   Pulse 67   Temp 99 F (37.2 C) (Oral)   Resp 18   Ht 6' 2.02" (1.88 m)   Wt 61.5 kg   SpO2 99%   BMI 17.40 kg/m   General: Well-appearing, resting peacefully in bed CV: RRR, cap refill less than 2 seconds Respiratory: Normal work of breathing on room air Abdomen: Soft, nondistended, nontender to palpation GU: Foley in place in penis, balloon not palpable within the shaft, scant blood around urethra, towel with some blood, Foley output tinged pink  A/P: Urinary retention Patient had traumatic Foley placement.  Required 2 tries.  Has Caude catheter and now.  Patient had bleeding when moving from catheter site and around catheter.  Now hemostatic.  Vital signs stable, no hypotensionPatient asymptomatic not dizzy or lightheaded.  However Coude catheter became clogged. Patient had over 500 cc on bladder scan.  - Consult coude catheter insertion team to replace catheter  -Continue to monitor urinary output, bladder scans, bleeding from around catheter site. -CBC tomorrow a.m. -Continue to monitor vital signs - Orders reviewed. Labs for AM ordered, which was adjusted as needed.  - If condition changes, plan includes CBC before a.m, consult urology if needed.   Lowry Ram, MD 05/04/2022, 10:11 PM PGY-1, [facility] Night Resident  Please page [phone] with questions.

## 2022-05-04 NOTE — Progress Notes (Signed)
Bladder scan 435m. JAlen Bleacher MD notified. See new order for foley catheter placement.   Unsuccessful at foley placement. Met with resistance. JAlen Bleacher MD and EDarci Current DO made aware. See new order for coude foley placement.

## 2022-05-05 DIAGNOSIS — I110 Hypertensive heart disease with heart failure: Secondary | ICD-10-CM | POA: Diagnosis not present

## 2022-05-05 DIAGNOSIS — N400 Enlarged prostate without lower urinary tract symptoms: Secondary | ICD-10-CM | POA: Diagnosis not present

## 2022-05-05 DIAGNOSIS — I4581 Long QT syndrome: Secondary | ICD-10-CM | POA: Diagnosis not present

## 2022-05-05 DIAGNOSIS — G9340 Encephalopathy, unspecified: Secondary | ICD-10-CM | POA: Diagnosis not present

## 2022-05-05 DIAGNOSIS — F0390 Unspecified dementia without behavioral disturbance: Secondary | ICD-10-CM | POA: Diagnosis not present

## 2022-05-05 DIAGNOSIS — R310 Gross hematuria: Secondary | ICD-10-CM | POA: Diagnosis not present

## 2022-05-05 DIAGNOSIS — I5020 Unspecified systolic (congestive) heart failure: Secondary | ICD-10-CM | POA: Diagnosis not present

## 2022-05-05 DIAGNOSIS — E119 Type 2 diabetes mellitus without complications: Secondary | ICD-10-CM | POA: Diagnosis not present

## 2022-05-05 DIAGNOSIS — E875 Hyperkalemia: Secondary | ICD-10-CM | POA: Diagnosis not present

## 2022-05-05 DIAGNOSIS — R7989 Other specified abnormal findings of blood chemistry: Secondary | ICD-10-CM | POA: Diagnosis not present

## 2022-05-05 DIAGNOSIS — Z7982 Long term (current) use of aspirin: Secondary | ICD-10-CM | POA: Diagnosis not present

## 2022-05-05 DIAGNOSIS — R972 Elevated prostate specific antigen [PSA]: Secondary | ICD-10-CM | POA: Diagnosis not present

## 2022-05-05 LAB — GLUCOSE, CAPILLARY
Glucose-Capillary: 125 mg/dL — ABNORMAL HIGH (ref 70–99)
Glucose-Capillary: 156 mg/dL — ABNORMAL HIGH (ref 70–99)
Glucose-Capillary: 156 mg/dL — ABNORMAL HIGH (ref 70–99)
Glucose-Capillary: 161 mg/dL — ABNORMAL HIGH (ref 70–99)
Glucose-Capillary: 214 mg/dL — ABNORMAL HIGH (ref 70–99)

## 2022-05-05 LAB — BASIC METABOLIC PANEL
Anion gap: 5 (ref 5–15)
BUN: 21 mg/dL (ref 8–23)
CO2: 25 mmol/L (ref 22–32)
Calcium: 9.2 mg/dL (ref 8.9–10.3)
Chloride: 102 mmol/L (ref 98–111)
Creatinine, Ser: 1.36 mg/dL — ABNORMAL HIGH (ref 0.61–1.24)
GFR, Estimated: 52 mL/min — ABNORMAL LOW (ref >60)
Glucose, Bld: 123 mg/dL — ABNORMAL HIGH (ref 70–99)
Potassium: 4.5 mmol/L (ref 3.5–5.1)
Sodium: 132 mmol/L — ABNORMAL LOW (ref 135–145)

## 2022-05-05 LAB — CBC
HCT: 31 % — ABNORMAL LOW (ref 39.0–52.0)
Hemoglobin: 10.2 g/dL — ABNORMAL LOW (ref 13.0–17.0)
MCH: 31.2 pg (ref 26.0–34.0)
MCHC: 32.9 g/dL (ref 30.0–36.0)
MCV: 94.8 fL (ref 80.0–100.0)
Platelets: 160 10*3/uL (ref 150–400)
RBC: 3.27 MIL/uL — ABNORMAL LOW (ref 4.22–5.81)
RDW: 13.7 % (ref 11.5–15.5)
WBC: 8.1 10*3/uL (ref 4.0–10.5)
nRBC: 0 % (ref 0.0–0.2)

## 2022-05-05 MED ORDER — ACETAMINOPHEN 500 MG PO TABS
1000.0000 mg | ORAL_TABLET | Freq: Four times a day (QID) | ORAL | Status: DC | PRN
  Administered 2022-05-05 (×2): 1000 mg via ORAL
  Filled 2022-05-05 (×2): qty 2

## 2022-05-05 MED ORDER — CHLORHEXIDINE GLUCONATE CLOTH 2 % EX PADS
6.0000 | MEDICATED_PAD | Freq: Every day | CUTANEOUS | Status: DC
  Administered 2022-05-05 – 2022-05-06 (×2): 6 via TOPICAL

## 2022-05-05 MED ORDER — POLYETHYLENE GLYCOL 3350 17 G PO PACK
17.0000 g | PACK | Freq: Every day | ORAL | Status: DC | PRN
  Administered 2022-05-05: 17 g via ORAL
  Filled 2022-05-05: qty 1

## 2022-05-05 NOTE — Discharge Instructions (Addendum)
You were hospitalized at [facility] due to confusion.  We are so glad you are feeling better.  Be sure to follow-up with your regularly scheduled appointments.  Please also be sure to follow-up with our clinic on 12/7 at 2:10 pm with Dr. Ronnald Ramp at your earliest convenience. Please make sure to take all your prescribed medications. Thank you for allowing [name] to be a part of your medical care.  Take care, [facility] family medicine team

## 2022-05-05 NOTE — TOC Progression Note (Signed)
Transition of Care [facility]) - Progression Note    Patient Details  Name: [name] MRN: [id number] Date of Birth: 12/06/1940  Transition of Care [facility]) CM/SW Contact  Joanne Chars, LCSW Phone Number: 05/05/2022, 9:49 AM  Clinical Narrative:   Auth approved in [place]: [id number], [id number].  3 days: 11/28-11/30. MD informed.     Expected Discharge Plan: [place] Barriers to Discharge: Continued Medical Work up  Expected Discharge Plan and Services Expected Discharge Plan: [place] In-house Referral: Clinical Social Work Discharge Planning Services: CM Consult Post Acute Care Choice: [place] Living arrangements for the past 2 months: Apartment                                       Social Determinants of Health (SDOH) Interventions    Readmission Risk Interventions     No data to display

## 2022-05-05 NOTE — Progress Notes (Signed)
Occupational Therapy Treatment Patient Details Name: Gerrad Welker MRN: [id number] DOB: [birth year] Today's Date: 05/05/2022   History of present illness 81 y.o. male presenting 04/30/22 with altered mental status. 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   OT comments  Patient discharged from OT services.  He appears to be back to his premorbid status.  As noted previously, the patient is not aware of any deficits, exhibits poor safety awareness, and is a fall risk.  He continues to need up to [place] for balance assist when up and moving.  The patient is not open to education, and will continue to perform ADL as he prefers.  Given resistance to education, and cognitive deficits, patient is discharged from OT.  Acute level OT will not improve his independence beyond his current baseline given deficits mentioned above.  Recommend memory care facility to provide care as needed.  PT and the mobility team continue to see him for mobility and balance.      Recommendations for follow up therapy are one component of a multi-disciplinary discharge planning process, led by the attending physician.  Recommendations may be updated based on patient status, additional functional criteria and insurance authorization.    Follow Up Recommendations  Other (comment) Memory Care    Assistance Recommended at Discharge Frequent or constant Supervision/Assistance  Patient can return home with the following  A little help with walking and/or transfers;A little help with bathing/dressing/bathroom;Assist for transportation;Direct supervision/assist for financial management;Direct supervision/assist for medications management   Equipment Recommendations  None recommended by OT    Recommendations for Other Services      Precautions / Restrictions Precautions Precautions: Fall Precaution  Comments: poor safety and insight Restrictions Weight Bearing Restrictions: No       Mobility Bed Mobility Overal bed mobility: Modified Independent                  Transfers Overall transfer level: Needs assistance   Transfers: Sit to/from Stand Sit to Stand: Supervision                 Balance Overall balance assessment: Needs assistance Sitting-balance support: Feet supported Sitting balance-Leahy Scale: Good     Standing balance support: Single extremity supported Standing balance-Leahy Scale: Poor                             ADL either performed or assessed with clinical judgement   ADL Overall ADL's : Needs assistance/impaired Eating/Feeding: Independent;Sitting   Grooming: Wash/dry hands;Supervision/safety;Standing   Upper Body Bathing: Set up;Sitting   Lower Body Bathing: Min guard;Minimal assistance;Sit to/from stand   Upper Body Dressing : Set up;Sitting   Lower Body Dressing: Min guard;Sit to/from stand;Minimal assistance   Toilet Transfer: Min Dentist;Ambulation   Toileting- Clothing Manipulation and Hygiene: Supervision/safety              Extremity/Trunk Assessment                                Cognition Arousal/Alertness: Awake/alert Behavior During Therapy: Impulsive Overall Cognitive Status: History of cognitive impairments - at baseline  Pertinent Vitals/ Pain       Pain Assessment Pain Assessment: No/denies pain                                                          Frequency           Progress Toward Goals  OT Goals(current goals can now be found in the care plan section)  Progress towards OT goals: Goals met/education completed, patient discharged from OT  Acute Rehab OT Goals OT Goal Formulation: With patient Time For Goal Achievement:  05/14/22 Potential to Achieve Goals: [place] All goals met and education completed, patient discharged from OT services    Co-evaluation                 AM-PAC OT "6 Clicks" Daily Activity     Outcome Measure   Help from another person eating meals?: None Help from another person taking care of personal grooming?: A Little Help from another person toileting, which includes using toliet, bedpan, or urinal?: A Little Help from another person bathing (including washing, rinsing, drying)?: A Little Help from another person to put on and taking off regular upper body clothing?: None Help from another person to put on and taking off regular lower body clothing?: A Little 6 Click Score: 20    End of Session    OT Visit Diagnosis: Unsteadiness on feet (R26.81);Other symptoms and signs involving cognitive function   Activity Tolerance Patient tolerated treatment well   Patient Left in bed;with call bell/phone within reach;with bed alarm set   Nurse Communication Mobility status        Time: 6294-7654 OT Time Calculation (min): 15 min  Charges: OT General Charges $OT Visit: 1 Visit OT Treatments $Self Care/Home Management : 8-22 mins  05/05/2022  RP, OTR/L  Acute Rehabilitation Services  Office:  [phone]   Metta Clines 05/05/2022, 9:14 AM

## 2022-05-05 NOTE — Discharge Summary (Incomplete)
[facility] Discharge Summary  Patient name: [name] Medical record number: [id number] Date of birth: 03/13/1941 Age: 81 y.o. 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)   Malnutrition (HCC)   Poor social situation   Malnutrition of moderate degree   Brief Hospital Course:  [name] is an 81 yo M w/ hx of dementia, hearing impairment, HFrEF 20%, dilated aortic root, HLD, T2DM (A1c 6.2), Mallory Weiss tear, goiter & hyperthyroid, gout, BPH, and tobacco use; that was admitted with AMS. His hospital course is outlined below:   AMS  Hx of Dementia Pt was found wondering the streets and confused by police, was missing for past 3 days. In ED, he was given fluids with little improvement to his mental status. Dementia labs were unremarkable for reversible causes.  Asymptomatic Bacteruria  Urinary Retention UA was positive for infection, but this seems to be common for him mostly likely due to his urinary retention. He was started treatment with Keflex, but was discontinued after one day due to not meeting treatment criteria for asymptomatic bacteruria. Patient was retaining > 300 mL so decided to do a foley catheter, but due to having blood clots this failed twice and a Coude catheter was inserted instead. Urology was consulted to evaluate patient  and recommended continuing foley at SNF until hematuria has resolved. Urology follow up scheduled outpatient.   AKI  Hyperkalemia  Prolonged QTc Likely pre-renal in setting of dehydration. Was given gentle IV fluids for one day till 11/25. Flomax was continued due to his urine retention as well, monitored I&Os. His creatinine has improved to his baseline. His QTc has also been waxing and waning, but is downtrending upon discharge.   HFrEF  No volume overload on admission. Pt was brady to 40-50's, so metoprolol was initially held and then home dose was halved. His HR has improved to 60-70s and remains stable. Discharged on Lasix, Jardiance, and Metoprolol.   Disposition: SNF - [facility]  Discharge Condition: at baseline  Issues for Follow Up:  Needs CT A/P w/wo outpt for hematuria, will follow up with urology. Foley catheter placed 11/28 Unsure if patient compliant on lasix, prescribed as prn on discharge. Please reassess and adjust accordingly  Discharge Exam:  Vitals:   05/06/22 0800 05/06/22 1100  BP: 119/71 121/60  Pulse: 67 64  Resp: 18   Temp: 98.1 F (36.7 C)   SpO2: 100%    Physical Exam: General: NAD. Sitting comfortably in bed. Cardiovascular: RRR. No M/R/G. Respiratory: CTAB. No wheezing or crackles heard.  Abdomen: Soft, nontender, nondistended. Normal bowel sounds.  Extremities: No LLE edema  Significant Procedures: Coude Cath - 11/28  Significant Labs and Imaging:  Recent Labs  Lab 05/05/22 0809 05/06/22 0607  WBC 8.1 6.9  HGB 10.2* 10.1*  HCT 31.0* 30.6*  PLT 160 155   Recent Labs  Lab 05/05/22 0809 05/06/22 0607  NA 132* 136  K 4.5 4.7  CL 102 102  CO2 25 26  GLUCOSE 123* 141*  BUN 21 22  CREATININE 1.36* 1.38*  CALCIUM 9.2 10.1    Pertinent Imaging  11/24 - CXR Impression: no active cardiopulmonary disease.  11/24 - CT Head w/o Contrast Impression: Chronic atrophic changes without acute abnormality.   Results/Tests Pending at  Time of Discharge: none  Discharge Medications:  Allergies as of 05/06/2022       Reactions   Ace Inhibitors Swelling, Other (See Comments)   Site of swelling not known by the patient   Valsartan Swelling, Other (See Comments)   Site of swelling not known by the patient        Medication List     TAKE these medications    aspirin 81 MG chewable tablet Chew 1 tablet (81 mg total) by mouth daily.   atorvastatin 80 MG tablet Commonly known as: LIPITOR Take 1 tablet (80 mg total) by mouth at bedtime.   empagliflozin 10 MG Tabs tablet Commonly known as: Jardiance Take 1 tablet (10 mg total) by mouth daily before breakfast.   feeding supplement Liqd Take 237 mLs by mouth 2 (two) times daily between meals.   furosemide 40 MG tablet Commonly known as: LASIX Take 0.5 tablets (20 mg total) by mouth daily as needed. What changed:  when to take this reasons to take this   metoprolol succinate 25 MG 24 hr tablet Commonly known as: TOPROL-XL Take 0.5 tablets (12.5 mg total) by mouth daily. What changed: how much to take   nicotine 7 mg/24hr patch Commonly known as: NICODERM CQ - dosed in mg/24 hr Place 1 patch (7 mg total) onto the skin daily.   tamsulosin 0.4 MG Caps capsule Commonly known as: FLOMAX Take 0.4 mg by mouth daily.        Discharge Instructions: Please refer to Patient Instructions section of EMR for full details.  Patient was counseled important signs and symptoms that should prompt return to medical care, changes in medications, dietary instructions, activity restrictions, and follow up appointments.   Follow-Up Appointments:  Contact information for follow-up providers     [name], DO. Go on 05/20/2022.   Specialty: Family Medicine Why: Appointment at 1:30 pm, please arrive at least 15 min prior to your scheduled appointment time. Contact information: [place] [address] [phone]         Lucas Mallow, MD. Go  on 05/13/2022.   Specialty: Urology Why: Appointment at 11 am. Contact information: [place] [address] [phone]              Contact information for after-discharge care     Destination     [facility] .   Service: Skilled Nursing Contact information: [place] [address] [name], [place], Medical Student 05/06/2022, 12:13 PM [facility]   I was personally present and re-performed the exam. I verified the service and findings are accurately documented in the student's note. Overall patient's mental status wax and wean which is consistent with the disease process of his dementia. He would benefit from a long term living facility for closer monitoring for his safety given report of patient being lost multiple times.   Alen Bleacher, MD 05/06/2022 12:17 PM

## 2022-05-05 NOTE — Progress Notes (Addendum)
Daily Progress Note Intern Pager: [phone]  Patient name: [name] Medical record number: [id number] Date of birth: [birth year] Age: 81 y.o. Gender: male  Primary Care Provider: [name], DO Consultants: [facility], Urology Code Status: FULL  Pt Overview and Major Events to Date:  11/24 - Admitted  Assessment and Plan: [name] is a 81 y.o. male admitted for altered mental status.  PMH/PSH of dementia, HFrEF 20%, dilated aortic root, HLD, T2DM (A1c 6.2), Mallory Weiss tear, hyperthyroidism/goiter, gout, BPH, and tobacco use.    * Encephalopathy A&O to self, city, and state, but not current location. SW has gotten Ship broker from SNF.  - Delirium precautions - Will d/c to [facility], daughter agrees.  Asymptomatic bacteriuria Patient has a history of chronic recurrent urine retention in the settings on BPH, and is on Flomax. Overnight patient passed multiple clot after foley attempt and was had a Caude Catheter placement. Consulted urology Dr Jeffie Pollock and they are familiar with the patient and recommended stopping anticoagulant for now given bleeding and plans to see patient.  - Urine culture is pending - Urology will see patient prior to D/C and provide recommendations  Prolonged Q-T interval on ECG QTc improved from 481 to 469. Will continue monitoring. - Avoid QTc prolonging medications  AKI (acute kidney injury) ([place]) Cr seems to be near his baseline (1.3) at 1.36 today. Overall euvolemic on exam. Needs CT AP outpatient.  -  Continue to monitor I/Os d/t urinary retention. 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  - Continue reduced home dose metoprolol 12.5 mg. Weigh risk vs. benefit to keep on or remove d/t HF. Will consider stopping metoprolol if HR drops below normal for him. - Holding home Lasix  and Jardiance in setting of AKI and UTI  -  Continue atorvastatin 80 mg - Has allergy to ACEi and valsartan (swelling)  Tobacco use Current daily smoker. Daughter does not know how many PPD. Nicotine patch 7 mg daily, adjust as necessary.   Anemia Iron labs not c/w IDA. Chart review reveals that pt is chronically anemic and this is baseline.  - Stable per baseline  Elevated LFTs LFTs stable. Hep C non-reactive. - Resolved  Malnutrition ([place]) Patient is eating well. On Ensure PO BID. - Daily weights. Will monitor for weight loss.    FEN/GI: DYS3 Diet PPx: SCDs Dispo: SNF  Subjective:  Overnight the patient had a traumatic foley catheter placement and has a Coude catheter now. Patient does not recall the incident and voiced no concerns regarding any urinary pain or issues today. This morning he shares that he is feeling better than yesterday. Patient denies any pain. He is unable to share what brought him into the hospital and today he doesn't recall where he is.   Objective: Temp:  [98.6 F (37 C)-99 F (37.2 C)] 98.6 F (37 C) (11/29 0933) Pulse Rate:  [66-72] 66 (11/29 0933) Resp:  [18] 18 (11/28 2129) BP: (104-141)/(51-70) 104/51 (11/29 0933) SpO2:  [99 %-100 %] 100 % (11/29 0933) Physical Exam: General: NAD. Sitting comfortably in bed eating breakfast. Cardiovascular: RRR. No M/R/G. Respiratory: CTAB. No wheezing or crackles heard.  Abdomen: Soft, nontender, nondistended. Normal bowel sounds.  Extremities: No LLE edema.   Laboratory: Most recent CBC Lab Results  Component Value Date   WBC 8.1 05/05/2022   HGB 10.2 (L) 05/05/2022   HCT 31.0 (L)  05/05/2022   MCV 94.8 05/05/2022   PLT 160 05/05/2022   Most recent BMP    Latest Ref Rng & Units 05/05/2022    8:09 AM  BMP  Glucose 70 - 99 mg/dL 123   BUN 8 - 23 mg/dL 21   Creatinine 0.61 - 1.24 mg/dL 1.36   Sodium 135 - 145 mmol/L 132   Potassium 3.5 - 5.1 mmol/L 4.5   Chloride 98 - 111 mmol/L 102   CO2 22 - 32 mmol/L 25   Calcium 8.9 - 10.3 mg/dL  9.2     Other pertinent labs - none  Imaging/Diagnostic Tests: No new imaging.  Britt Bolognese, Medical Student 05/05/2022, 10:49 AM  I was personally present and performed or re-performed the history, physical exam and medical decision making activities of this service and have verified that the service and findings are accurately documented in the student's note. My edits are noted within the note above. Please also see attending's attestation.   Donney Dice, DO                  05/05/2022, 11:27 AM PGY-3, [place] Intern pager: [phone], text pages welcome Secure chat group [place]

## 2022-05-05 NOTE — Progress Notes (Signed)
Consulted Dr. Jeffie Pollock with urology about patient passing multiple clot after foley attempt and eventual Caude Catheter placement. Patient with urine retention likely 2/2 to BPH. Dr. Jeffie Pollock recommend stopping anticoagulant for now given bleeding and plans to see patient.   Alen Bleacher, MD PGY-2, [facility] Family Medicine Resident  Please page [phone] with questions.

## 2022-05-05 NOTE — Consult Note (Signed)
Subjective:    Consult requested by Dr. Andrena Mews  CC: Gross hematuria   05/05/22: Mr Kucher was found to have an elevated PVR while hospitalized and an attempt at foley placement was initially unsuccessful.  He subsetquently had a coude foley placed but has hematuria with clots on Lovenox now, probably from foley trauma.   He has been seen in our office with a history of an elevated PVR without symptoms and was being followed without intervention. His urine cleared this AM but is a little pink now but without clots.  He has no suprapubic pain.    HPI:  GU Hx:  12/15/2021  Patient was recently discharged from the hospital on 11/18/2021. He was admitted for UTI and acute renal insufficiency secondary to dehydration. He was also noted to have gross hematuria. Most recent creatinine improved to baseline on 11/23/2021 and was 1.36 with a GFR of 52. First episode of gross hematuria. He underwent a renal ultrasound on 11/18/2021 that revealed difficult to exclude irregular bladder wall thickening and dependent debris within the bladder. There was no solid mass in the kidneys. He had some simple cysts. No hydronephrosis bilaterally. Urine culture revealed multiple species. He denies any voiding complaints but does have a postvoid residual of 652 cc. He is no longer taking his doxazosin or finasteride. He does now have dementia.   01/28/2022  Patient denies persistent gross hematuria. He underwent CT hematuria protocol that revealed heterogeneous nonenhancing material within the bladder consistent with clot. No upper tract filling defects. No renal mass. No calculi. Numerous simple renal cyst. Bladder was moderately distended. He is not symptomatic and has no hydronephrosis. As mentioned before, he has some dementia and I do not think he would do well with intermittent catheterization or would be reliable. Given his asymptomatic status and lack of hydronephrosis, have elected for observation of this.      ROS:  Review of Systems  All other systems reviewed and are negative.   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   Urticaria     Past Surgical History:  Procedure Laterality Date   ADENOIDECTOMY     CHOLECYSTECTOMY  1960   COLON SURGERY     right hemi-colectomy   COLONOSCOPY  multiple   ESOPHAGOGASTRODUODENOSCOPY     TONSILLECTOMY      Social History   Socioeconomic History  Marital status: Divorced    Spouse name: Not on file   Number of children: 3   Years of education: 8   Highest education level: High school graduate  Occupational History   Occupation: retired    Comment: Administrator   Tobacco Use   Smoking status: Every Day    Packs/day: 1.00    Years: 30.00    Total pack years: 30.00    Types: Cigarettes    Passive exposure: Current   Smokeless tobacco: Current    Types: Chew    Last attempt to quit: 06/07/2001  Vaping Use   Vaping Use: Never used  Substance and Sexual Activity   Alcohol use: No    Alcohol/week:  0.0 standard drinks of alcohol    Comment: quit about 10 years ago    Drug use: No   Sexual activity: Not Currently    Partners: Female  Other Topics Concern   Not on file  Social History Narrative   Lives in [place] alone   Hobbies: hangs out socially    Patient enjoys walking around the community to stay active.      Right handed   Caffeine: none       Social Information ( copy and paste under history) updated 10/07/20   patient lives alone  . Retired Sports coach . Primary family support persons is daughter, [name]    Difficulty affording medication: No    Strengths:Supportive family/friends      Family Members: Recruitment consultant (Dgt), Assunta Found (Son), Vevelyn Royals (Sister)      Basic Activities of Daily Living    Dressing:Self-care   Eating: Partial assistance receives Mom's Meals; able to cook basic things   Ambulation: Partial assistance uses a cane   Toileting: Partial assistance uses depends   Bathing: Self-care; daughter unsure or how well he is doing in this area       Instrumental Activities of Daily Living   Shopping: Partial assistance; daughter helps   House/Yard Work: Partial assistance; daughter helps   Administration of medications: Total assistance; Daughter sets up medication; calls or comes by to make sure he takes it   Finances: Partial assistance; will not allow daughter to help she is unsure how he is paying everything   Telephone: Partial assistance; has land line phone but usually doesn't answer it   Transportation: Total assistance; Daughter takes to all appointments       Caregivers in home: No   Social Determinants of Health   Financial Resource Strain: [place]  (04/27/2022)   Overall Financial Resource Strain (CARDIA)    Difficulty of Paying Living Expenses: Not hard at all  Food Insecurity: No Food Insecurity (04/27/2022)   Hunger Vital Sign    Worried About Running Out of Food in the Last Year: Never true    [place] in  the Last Year: Never true  Transportation Needs: No Transportation Needs (04/27/2022)   PRAPARE - Hydrologist (Medical): No    Lack of Transportation (Non-Medical): No  Physical Activity: Inactive (04/27/2022)   Exercise Vital Sign    Days of Exercise per Week: 0 days    Minutes of Exercise per Session: 0 min  Stress: No Stress Concern Present (04/27/2022)   [place]    Feeling of Stress : Not at all  Social Connections: Moderately Isolated (04/27/2022)   Social Connection and Isolation Panel [NHANES]  Frequency of Communication with Friends and Family: Once a week    Frequency of Social Gatherings with Friends and Family: Twice a week    Attends Religious Services: Never    Marine scientist or Organizations: Yes    Attends Archivist Meetings: Never    Marital Status: Divorced  Human resources officer Violence: Not At Risk (04/27/2022)   Humiliation, Afraid, Rape, and Kick questionnaire    Fear of Current or Ex-Partner: No    Emotionally Abused: No    Physically Abused: No    Sexually Abused: No    Family History  Problem Relation Age of Onset   Diabetes Other        nephew   Colon cancer Neg Hx     Anti-infectives: Anti-infectives (From admission, onward)    Start     Dose/Rate Route Frequency Ordered Stop   05/02/22 1400  cephALEXin (KEFLEX) capsule 250 mg  Status:  Discontinued        250 mg Oral Every 8 hours 05/02/22 1324 05/03/22 1046   05/01/22 2200  cephALEXin (KEFLEX) capsule 250 mg  Status:  Discontinued        250 mg Oral Every 8 hours 05/01/22 2028 05/02/22 1324       Current Facility-Administered Medications  Medication Dose Route Frequency Provider Last Rate Last Admin   aspirin chewable tablet 81 mg  81 mg Oral Daily Coralyn Pear B, MD   81 mg at 05/05/22 0829   atorvastatin (LIPITOR) tablet 80 mg  80 mg Oral QHS Coralyn Pear B, MD   80 mg  at 05/04/22 2119   Chlorhexidine Gluconate Cloth 2 % PADS 6 each  6 each Topical Daily Andrena Mews T, MD       enoxaparin (LOVENOX) injection 40 mg  40 mg Subcutaneous Q24H Jardin, Carla G, RPH   40 mg at 05/05/22 0831   feeding supplement (ENSURE ENLIVE / ENSURE PLUS) liquid 237 mL  237 mL Oral BID BM Eniola, Kehinde T, MD   237 mL at 05/04/22 1421   insulin aspart (novoLOG) injection 0-9 Units  0-9 Units Subcutaneous TID WC Arlyce Dice, MD   1 Units at 05/05/22 0488   melatonin tablet 3 mg  3 mg Oral QHS Dameron, Luna Fuse, DO   3 mg at 05/04/22 2118   metoprolol succinate (TOPROL-XL) 24 hr tablet 12.5 mg  12.5 mg Oral Daily Coralyn Pear B, MD   12.5 mg at 05/05/22 8916   nicotine (NICODERM CQ - dosed in mg/24 hr) patch 7 mg  7 mg Transdermal Daily Ezequiel Essex, MD   7 mg at 05/05/22 0834   tamsulosin (FLOMAX) capsule 0.4 mg  0.4 mg Oral Daily Coralyn Pear B, MD   0.4 mg at 05/05/22 9450     Objective: Vital signs in last 24 hours: BP 138/68 (BP Location: Left Arm)   Pulse 72   Temp 99 F (37.2 C) (Oral)   Resp 18   Ht 6' 2.02" (1.88 m)   Wt 61.5 kg   SpO2 99%   BMI 17.40 kg/m   Intake/Output from previous day: 11/28 0701 - 11/29 0700 In: 3888 [P.O.:1016] Out: 2800 [Urine:1425] Intake/Output this shift: No intake/output data recorded.   Physical Exam Vitals reviewed.  Constitutional:      Appearance: Normal appearance.  Abdominal:     General: Abdomen is flat.     Palpations: Abdomen is soft. There is no mass.  Genitourinary:    Comments: Foley indwelling with  pink urine in the tubing, but it is appears to be clearing.  Neurological:     Mental Status: He is alert.     Lab Results:  Results for orders placed or performed during the hospital encounter of 04/30/22 (from the past 24 hour(s))  Glucose, capillary     Status: Abnormal   Collection Time: 05/04/22 11:10 AM  Result Value Ref Range   Glucose-Capillary 100 (H) 70 - 99 mg/dL  Glucose, capillary      Status: Abnormal   Collection Time: 05/04/22  4:36 PM  Result Value Ref Range   Glucose-Capillary 140 (H) 70 - 99 mg/dL  Glucose, capillary     Status: Abnormal   Collection Time: 05/04/22 11:58 PM  Result Value Ref Range   Glucose-Capillary 161 (H) 70 - 99 mg/dL  Glucose, capillary     Status: Abnormal   Collection Time: 05/05/22  8:08 AM  Result Value Ref Range   Glucose-Capillary 125 (H) 70 - 99 mg/dL    BMET Recent Labs    05/03/22 0206 05/04/22 0239  NA 140 135  K 4.5 4.5  CL 108 104  CO2 25 25  GLUCOSE 211* 116*  BUN 25* 19  CREATININE 1.35* 1.12  CALCIUM 9.5 9.3   PT/INR No results for input(s): "LABPROT", "INR" in the last 72 hours. ABG No results for input(s): "PHART", "HCO3" in the last 72 hours.  Invalid input(s): "PCO2", "PO2"  Studies/Results: No results found. I have reviewed his UA which has pyuria and his culture which was negative on 05/01/22.   I reviewed his CT from July in our office that showed bladder wall thickening with trabeculation and a few clots in the bladder.  Assessment/Plan: Gross hematuria from foley trauma.   Irrigate foley prn and hold Lovenox.   Urine is clearing.   BPH with chronically elevated PVR on tamsulosin.  Continue foley until hematuria has resolved and then consider removal possibly in the morning.   History of UTI.  His culture was negative on 11/25 despite the pyuria which is probably secondary to his chronically elevated PVR.         No follow-ups on file.    CC: Andrena Mews MD and Dr. Westley Chandler.     Irine Seal 05/05/2022

## 2022-05-06 DIAGNOSIS — M6281 Muscle weakness (generalized): Secondary | ICD-10-CM | POA: Diagnosis not present

## 2022-05-06 DIAGNOSIS — R339 Retention of urine, unspecified: Secondary | ICD-10-CM | POA: Diagnosis not present

## 2022-05-06 DIAGNOSIS — R531 Weakness: Secondary | ICD-10-CM | POA: Diagnosis not present

## 2022-05-06 DIAGNOSIS — E785 Hyperlipidemia, unspecified: Secondary | ICD-10-CM | POA: Diagnosis not present

## 2022-05-06 DIAGNOSIS — E441 Mild protein-calorie malnutrition: Secondary | ICD-10-CM | POA: Diagnosis not present

## 2022-05-06 DIAGNOSIS — R2681 Unsteadiness on feet: Secondary | ICD-10-CM | POA: Diagnosis not present

## 2022-05-06 DIAGNOSIS — R4182 Altered mental status, unspecified: Secondary | ICD-10-CM | POA: Diagnosis not present

## 2022-05-06 DIAGNOSIS — D649 Anemia, unspecified: Secondary | ICD-10-CM | POA: Diagnosis not present

## 2022-05-06 DIAGNOSIS — E119 Type 2 diabetes mellitus without complications: Secondary | ICD-10-CM | POA: Diagnosis not present

## 2022-05-06 DIAGNOSIS — R262 Difficulty in walking, not elsewhere classified: Secondary | ICD-10-CM | POA: Diagnosis not present

## 2022-05-06 DIAGNOSIS — I959 Hypotension, unspecified: Secondary | ICD-10-CM | POA: Diagnosis not present

## 2022-05-06 DIAGNOSIS — I5022 Chronic systolic (congestive) heart failure: Secondary | ICD-10-CM | POA: Diagnosis not present

## 2022-05-06 DIAGNOSIS — I4581 Long QT syndrome: Secondary | ICD-10-CM | POA: Diagnosis not present

## 2022-05-06 DIAGNOSIS — N401 Enlarged prostate with lower urinary tract symptoms: Secondary | ICD-10-CM | POA: Diagnosis not present

## 2022-05-06 DIAGNOSIS — R3914 Feeling of incomplete bladder emptying: Secondary | ICD-10-CM | POA: Diagnosis not present

## 2022-05-06 DIAGNOSIS — N320 Bladder-neck obstruction: Secondary | ICD-10-CM | POA: Diagnosis not present

## 2022-05-06 DIAGNOSIS — R2689 Other abnormalities of gait and mobility: Secondary | ICD-10-CM | POA: Diagnosis not present

## 2022-05-06 DIAGNOSIS — R319 Hematuria, unspecified: Secondary | ICD-10-CM | POA: Diagnosis not present

## 2022-05-06 DIAGNOSIS — R404 Transient alteration of awareness: Secondary | ICD-10-CM | POA: Diagnosis not present

## 2022-05-06 DIAGNOSIS — S37099S Other injury of unspecified kidney, sequela: Secondary | ICD-10-CM | POA: Diagnosis not present

## 2022-05-06 DIAGNOSIS — R310 Gross hematuria: Secondary | ICD-10-CM | POA: Diagnosis not present

## 2022-05-06 DIAGNOSIS — N179 Acute kidney failure, unspecified: Secondary | ICD-10-CM | POA: Diagnosis not present

## 2022-05-06 DIAGNOSIS — G9340 Encephalopathy, unspecified: Secondary | ICD-10-CM | POA: Diagnosis not present

## 2022-05-06 DIAGNOSIS — N390 Urinary tract infection, site not specified: Secondary | ICD-10-CM | POA: Diagnosis not present

## 2022-05-06 DIAGNOSIS — I5020 Unspecified systolic (congestive) heart failure: Secondary | ICD-10-CM | POA: Diagnosis not present

## 2022-05-06 DIAGNOSIS — R6883 Chills (without fever): Secondary | ICD-10-CM | POA: Diagnosis not present

## 2022-05-06 LAB — GLUCOSE, CAPILLARY
Glucose-Capillary: 151 mg/dL — ABNORMAL HIGH (ref 70–99)
Glucose-Capillary: 105 mg/dL — ABNORMAL HIGH (ref 70–99)

## 2022-05-06 LAB — BASIC METABOLIC PANEL
Anion gap: 8 (ref 5–15)
BUN: 22 mg/dL (ref 8–23)
CO2: 26 mmol/L (ref 22–32)
Calcium: 10.1 mg/dL (ref 8.9–10.3)
Chloride: 102 mmol/L (ref 98–111)
Creatinine, Ser: 1.38 mg/dL — ABNORMAL HIGH (ref 0.61–1.24)
GFR, Estimated: 51 mL/min — ABNORMAL LOW (ref >60)
Glucose, Bld: 141 mg/dL — ABNORMAL HIGH (ref 70–99)
Potassium: 4.7 mmol/L (ref 3.5–5.1)
Sodium: 136 mmol/L (ref 135–145)

## 2022-05-06 LAB — CBC
HCT: 30.6 % — ABNORMAL LOW (ref 39.0–52.0)
Hemoglobin: 10.1 g/dL — ABNORMAL LOW (ref 13.0–17.0)
MCH: 31.7 pg (ref 26.0–34.0)
MCHC: 33 g/dL (ref 30.0–36.0)
MCV: 95.9 fL (ref 80.0–100.0)
Platelets: 155 10*3/uL (ref 150–400)
RBC: 3.19 MIL/uL — ABNORMAL LOW (ref 4.22–5.81)
RDW: 13.8 % (ref 11.5–15.5)
WBC: 6.9 10*3/uL (ref 4.0–10.5)
nRBC: 0 % (ref 0.0–0.2)

## 2022-05-06 MED ORDER — FUROSEMIDE 40 MG PO TABS: 20.0000 mg | ORAL_TABLET | Freq: Every day | ORAL | 3 refills | Status: DC | PRN

## 2022-05-06 MED ORDER — METOPROLOL SUCCINATE ER 25 MG PO TB24: 12.5000 mg | ORAL_TABLET | Freq: Every day | ORAL | Status: DC

## 2022-05-06 MED ORDER — ENSURE ENLIVE PO LIQD: 237.0000 mL | Freq: Two times a day (BID) | ORAL | 12 refills | Status: DC

## 2022-05-06 MED ORDER — NICOTINE 7 MG/24HR TD PT24: 7.0000 mg | MEDICATED_PATCH | Freq: Every day | TRANSDERMAL | 0 refills | Status: DC

## 2022-05-06 NOTE — Progress Notes (Signed)
[name] was called multiple times in order to give report on patient. The receptionist picked up and transferred the call, but no one picked up the call to take report. Case Management was notified.

## 2022-05-06 NOTE — Progress Notes (Incomplete)
Daily Progress Note Intern Pager: [phone]  Patient name: [name] Medical record number: [id number] Date of birth: July 19, 1940 Age: 81 y.o. Gender: male  Primary Care Provider: [name], DO Consultants: Urology, [facility] Code Status: FULL  Pt Overview and Major Events to Date:  11/24 - admitted  Assessment and Plan: [name] is a 81 y.o. male admitted for altered mental status.   PMH/PSH of dementia, HFrEF 20%, dilated aortic root, HLD, T2DM (A1c 6.2), Mallory Weiss tear, hyperthyroidism/goiter, gout, BPH, and tobacco use.   * Encephalopathy A&O to self and current location. SW has gotten Ship broker from SNF.  - Delirium precautions - Will d/c to [facility], daughter agrees.  Asymptomatic bacteriuria Patient has a history of chronic recurrent urine retention in the settings on BPH, and is on Flomax. Urology recommended continuing the foley until hematuria resolves, most likely from traumatic foley during hospitalization. Urine culture showed no growth. - Awaiting urology recs regarding d/c with foley d/t hematuria  Prolonged Q-T interval on ECG QTc improved from 481 to 469. - Avoid QTc prolonging medications  AKI (acute kidney injury) ([place]) Cr seems to be near his baseline (1.3) at 1.38 today. Needs CT AP outpatient.  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 No associated symptoms and is euvolemic on exam. - Continue reduced home dose metoprolol 12.5 mg. - Holding home Lasix  and Jardiance in setting of AKI and UTI until discharge - Continue atorvastatin 80 mg - Has allergy to ACEi and valsartan (swelling)  Tobacco use Current daily smoker. Daughter does not know how many PPD. Nicotine patch 7 mg daily, adjust as necessary.   Anemia Iron labs not c/w IDA. Chart review  reveals that pt is chronically anemic and this is baseline.  - Stable per baseline  Elevated LFTs LFTs stable. Hep C non-reactive. - Resolved  Malnutrition ([place]) Patient is eating well. On Ensure PO BID. - Daily weights. Will monitor for weight loss.   FEN/GI: DYS3 PPx: SCDs Dispo: SNF  Subjective:  Patient is doing better this morning. Has no complaints of pain or discomfort. Still has hematuria. He is alert and oriented to self and place. Denies chest pain, shortness of breath, headache, dizziness, or N/V/D.   Objective: Temp:  [98.1 F (36.7 C)-98.8 F (37.1 C)] 98.1 F (36.7 C) (11/30 0800) Pulse Rate:  [66-74] 67 (11/30 0800) Resp:  [16-18] 18 (11/30 0800) BP: (104-130)/(51-73) 119/71 (11/30 0800) SpO2:  [99 %-100 %] 100 % (11/30 0800) Weight:  [57.3 kg] 57.3 kg (11/30 0500) Physical Exam: General: Laying comfortably in bed in NAD. Cardiovascular: RRR. No M/R/G. Respiratory: CTAB. Normal work of breathing. Abdomen: Soft, nontender, nondistended. Normal bowel sounds.  Extremities: No LLE edema.  Laboratory: Most recent CBC Lab Results  Component Value Date   WBC 6.9 05/06/2022   HGB 10.1 (L) 05/06/2022   HCT 30.6 (L) 05/06/2022   MCV 95.9 05/06/2022   PLT 155 05/06/2022   Most recent BMP    Latest Ref Rng & Units 05/06/2022    6:07 AM  BMP  Glucose 70 - 99 mg/dL 141   BUN 8 - 23 mg/dL 22   Creatinine 0.61 - 1.24 mg/dL 1.38   Sodium 135 - 145 mmol/L 136   Potassium 3.5 - 5.1 mmol/L 4.7   Chloride 98 - 111 mmol/L  102   CO2 22 - 32 mmol/L 26   Calcium 8.9 - 10.3 mg/dL 10.1     Other pertinent labs - none  Imaging/Diagnostic Tests: No new imaging.  Britt Bolognese, Medical Student 05/06/2022, 9:24 AM  [place] Intern pager: [phone], text pages welcome Secure chat group [place]

## 2022-05-06 NOTE — Progress Notes (Signed)
  Patient with history of longstanding urinary retention, sill experiencing hematuria which is likely secondary to trauma from foley placement. Urology evaluated patient yesterday. Spoke with Dr. Jeffie Pollock who recommends that given ongoing hematuria, he can be discharged to SNF with foley as long as he has close outpatient urology follow up in 1 week. [facility] and scheduled an appointment for patient with his urologist, Dr. Gloriann Loan next week. Appreciate assistance of urology team.   Donney Dice, DO 05/06/2022, 11:26 AM PGY-3, [place] Medicine Service pager [phone]

## 2022-05-06 NOTE — TOC Transition Note (Signed)
Transition of Care [facility]) - CM/SW Discharge Note   Patient Details  Name: [name] MRN: [id number] Date of Birth: 1940/07/10  [facility] CM/SW Contact:  Amador Cunas, [place] Phone Number: 05/06/2022, 12:57 PM   Clinical Narrative:  Pt for dc to [facility] today. Spoke to [place] in admissions who confirmed they are prepared to admit pt to room 907A. Pt's dtr [name] aware of dc and reports agreeable. RN provided with number for report and PTAR arranged for transport. SW signing off at dc.   Wandra Feinstein, MSW, LCSW [phone] (coverage)       Final next level of care: Skilled Nursing Facility Barriers to Discharge: No Barriers Identified   Patient Goals and CMS Choice Patient states their goals for this hospitalization and ongoing recovery are:: rehab then memory care CMS Medicare.gov Compare Post Acute Care list provided to:: Patient Represenative (must comment) (daughter, [name]) Choice offered to / list presented to : Adult Children  Discharge Placement              Patient chooses bed at: [facility] Patient to be transferred to facility by: [facility] Name of family member notified: [name]/dtr Patient and family notified of of transfer: 05/06/22  Discharge Plan and Services In-house Referral: Clinical Social Work Discharge Planning Services: CM Consult Post Acute Care Choice: [place]                               Social Determinants of Health (SDOH) Interventions     Readmission Risk Interventions     No data to display

## 2022-05-06 NOTE — Progress Notes (Signed)
DISCHARGE NOTE SNF [facility] to be discharged Skilled nursing facility per MD order. Patient verbalized understanding. Guardian made aware.  Skin clean, dry and intact without evidence of skin break down, no evidence of skin tears noted. IV catheter discontinued intact. Site without signs and symptoms of complications. Dressing and pressure applied. Pt denies pain at the site currently. No complaints noted.  Patient discharged with an indwelling foley catheter.   Discharge packet assembled. An After Visit Summary (AVS) was printed and given to the EMS personnel. Patient escorted via stretcher and discharged to [facility] via ambulance. Report called to accepting facility; all questions and concerns addressed.   Orville Govern, RN

## 2022-05-07 DIAGNOSIS — I5022 Chronic systolic (congestive) heart failure: Secondary | ICD-10-CM | POA: Diagnosis not present

## 2022-05-07 DIAGNOSIS — G9340 Encephalopathy, unspecified: Secondary | ICD-10-CM | POA: Diagnosis not present

## 2022-05-07 DIAGNOSIS — R319 Hematuria, unspecified: Secondary | ICD-10-CM | POA: Diagnosis not present

## 2022-05-07 DIAGNOSIS — N320 Bladder-neck obstruction: Secondary | ICD-10-CM | POA: Diagnosis not present

## 2022-05-11 DIAGNOSIS — N179 Acute kidney failure, unspecified: Secondary | ICD-10-CM | POA: Diagnosis not present

## 2022-05-11 DIAGNOSIS — N320 Bladder-neck obstruction: Secondary | ICD-10-CM | POA: Diagnosis not present

## 2022-05-11 DIAGNOSIS — R319 Hematuria, unspecified: Secondary | ICD-10-CM | POA: Diagnosis not present

## 2022-05-11 DIAGNOSIS — G9340 Encephalopathy, unspecified: Secondary | ICD-10-CM | POA: Diagnosis not present

## 2022-05-13 ENCOUNTER — Ambulatory Visit: Payer: Self-pay | Admitting: Student

## 2022-05-13 DIAGNOSIS — R310 Gross hematuria: Secondary | ICD-10-CM | POA: Diagnosis not present

## 2022-05-13 DIAGNOSIS — R3914 Feeling of incomplete bladder emptying: Secondary | ICD-10-CM | POA: Diagnosis not present

## 2022-05-13 DIAGNOSIS — N401 Enlarged prostate with lower urinary tract symptoms: Secondary | ICD-10-CM | POA: Diagnosis not present

## 2022-05-20 ENCOUNTER — Ambulatory Visit: Payer: Self-pay | Admitting: Student

## 2022-05-26 DIAGNOSIS — G9349 Other encephalopathy: Secondary | ICD-10-CM | POA: Diagnosis not present

## 2022-05-26 DIAGNOSIS — N390 Urinary tract infection, site not specified: Secondary | ICD-10-CM | POA: Diagnosis not present

## 2022-05-26 DIAGNOSIS — D649 Anemia, unspecified: Secondary | ICD-10-CM | POA: Diagnosis not present

## 2022-05-26 DIAGNOSIS — E46 Unspecified protein-calorie malnutrition: Secondary | ICD-10-CM | POA: Diagnosis not present

## 2022-06-03 DIAGNOSIS — Z043 Encounter for examination and observation following other accident: Secondary | ICD-10-CM | POA: Diagnosis not present

## 2022-06-03 DIAGNOSIS — F03918 Unspecified dementia, unspecified severity, with other behavioral disturbance: Secondary | ICD-10-CM | POA: Diagnosis not present

## 2022-06-23 DIAGNOSIS — R451 Restlessness and agitation: Secondary | ICD-10-CM | POA: Diagnosis not present

## 2022-06-23 DIAGNOSIS — I10 Essential (primary) hypertension: Secondary | ICD-10-CM | POA: Diagnosis not present

## 2022-06-23 DIAGNOSIS — Z5181 Encounter for therapeutic drug level monitoring: Secondary | ICD-10-CM | POA: Diagnosis not present

## 2022-06-29 ENCOUNTER — Ambulatory Visit: Payer: Medicare PPO | Admitting: Student

## 2022-06-29 DIAGNOSIS — I5022 Chronic systolic (congestive) heart failure: Secondary | ICD-10-CM | POA: Diagnosis not present

## 2022-06-29 DIAGNOSIS — R319 Hematuria, unspecified: Secondary | ICD-10-CM | POA: Diagnosis not present

## 2022-06-29 DIAGNOSIS — N320 Bladder-neck obstruction: Secondary | ICD-10-CM | POA: Diagnosis not present

## 2022-06-29 DIAGNOSIS — G9340 Encephalopathy, unspecified: Secondary | ICD-10-CM | POA: Diagnosis not present

## 2022-07-06 DIAGNOSIS — F03918 Unspecified dementia, unspecified severity, with other behavioral disturbance: Secondary | ICD-10-CM | POA: Diagnosis not present

## 2022-07-08 DIAGNOSIS — R5381 Other malaise: Secondary | ICD-10-CM | POA: Diagnosis not present

## 2022-07-08 DIAGNOSIS — F03B11 Unspecified dementia, moderate, with agitation: Secondary | ICD-10-CM | POA: Diagnosis not present

## 2022-07-08 DIAGNOSIS — R062 Wheezing: Secondary | ICD-10-CM | POA: Diagnosis not present

## 2022-07-09 DIAGNOSIS — R531 Weakness: Secondary | ICD-10-CM | POA: Diagnosis not present

## 2022-07-09 DIAGNOSIS — R051 Acute cough: Secondary | ICD-10-CM | POA: Diagnosis not present

## 2022-07-09 DIAGNOSIS — I509 Heart failure, unspecified: Secondary | ICD-10-CM | POA: Diagnosis not present

## 2022-07-09 DIAGNOSIS — E785 Hyperlipidemia, unspecified: Secondary | ICD-10-CM | POA: Diagnosis not present

## 2022-07-09 DIAGNOSIS — E119 Type 2 diabetes mellitus without complications: Secondary | ICD-10-CM | POA: Diagnosis not present

## 2022-07-09 DIAGNOSIS — N390 Urinary tract infection, site not specified: Secondary | ICD-10-CM | POA: Diagnosis not present

## 2022-07-12 ENCOUNTER — Emergency Department (HOSPITAL_COMMUNITY): Payer: Medicare PPO

## 2022-07-12 ENCOUNTER — Other Ambulatory Visit: Payer: Self-pay

## 2022-07-12 ENCOUNTER — Encounter (HOSPITAL_COMMUNITY): Payer: Self-pay | Admitting: Family Medicine

## 2022-07-12 ENCOUNTER — Inpatient Hospital Stay (HOSPITAL_COMMUNITY)
Admission: EM | Admit: 2022-07-12 | Discharge: 2022-07-26 | DRG: 698 | Disposition: A | Discharge status: Hospice | Payer: Medicare PPO | Source: Skilled Nursing Facility | Attending: Family Medicine | Admitting: Family Medicine

## 2022-07-12 DIAGNOSIS — E785 Hyperlipidemia, unspecified: Secondary | ICD-10-CM | POA: Diagnosis present

## 2022-07-12 DIAGNOSIS — G9349 Other encephalopathy: Secondary | ICD-10-CM | POA: Diagnosis not present

## 2022-07-12 DIAGNOSIS — N1831 Chronic kidney disease, stage 3a: Secondary | ICD-10-CM | POA: Diagnosis present

## 2022-07-12 DIAGNOSIS — A419 Sepsis, unspecified organism: Secondary | ICD-10-CM | POA: Diagnosis present

## 2022-07-12 DIAGNOSIS — N281 Cyst of kidney, acquired: Secondary | ICD-10-CM | POA: Diagnosis not present

## 2022-07-12 DIAGNOSIS — N390 Urinary tract infection, site not specified: Secondary | ICD-10-CM

## 2022-07-12 DIAGNOSIS — Z8719 Personal history of other diseases of the digestive system: Secondary | ICD-10-CM

## 2022-07-12 DIAGNOSIS — I130 Hypertensive heart and chronic kidney disease with heart failure and stage 1 through stage 4 chronic kidney disease, or unspecified chronic kidney disease: Secondary | ICD-10-CM | POA: Diagnosis present

## 2022-07-12 DIAGNOSIS — A4151 Sepsis due to Escherichia coli [E. coli]: Secondary | ICD-10-CM | POA: Diagnosis present

## 2022-07-12 DIAGNOSIS — B9620 Unspecified Escherichia coli [E. coli] as the cause of diseases classified elsewhere: Secondary | ICD-10-CM

## 2022-07-12 DIAGNOSIS — I5020 Unspecified systolic (congestive) heart failure: Secondary | ICD-10-CM | POA: Diagnosis not present

## 2022-07-12 DIAGNOSIS — R509 Fever, unspecified: Secondary | ICD-10-CM | POA: Diagnosis not present

## 2022-07-12 DIAGNOSIS — E43 Unspecified severe protein-calorie malnutrition: Secondary | ICD-10-CM | POA: Insufficient documentation

## 2022-07-12 DIAGNOSIS — R531 Weakness: Principal | ICD-10-CM

## 2022-07-12 DIAGNOSIS — N179 Acute kidney failure, unspecified: Secondary | ICD-10-CM | POA: Diagnosis present

## 2022-07-12 DIAGNOSIS — R6520 Severe sepsis without septic shock: Secondary | ICD-10-CM

## 2022-07-12 DIAGNOSIS — E870 Hyperosmolality and hypernatremia: Secondary | ICD-10-CM | POA: Diagnosis not present

## 2022-07-12 DIAGNOSIS — M79604 Pain in right leg: Secondary | ICD-10-CM | POA: Diagnosis not present

## 2022-07-12 DIAGNOSIS — Z4901 Encounter for fitting and adjustment of extracorporeal dialysis catheter: Secondary | ICD-10-CM | POA: Diagnosis not present

## 2022-07-12 DIAGNOSIS — Z7189 Other specified counseling: Secondary | ICD-10-CM | POA: Diagnosis not present

## 2022-07-12 DIAGNOSIS — Y846 Urinary catheterization as the cause of abnormal reaction of the patient, or of later complication, without mention of misadventure at the time of the procedure: Secondary | ICD-10-CM | POA: Diagnosis present

## 2022-07-12 DIAGNOSIS — R1310 Dysphagia, unspecified: Secondary | ICD-10-CM | POA: Diagnosis present

## 2022-07-12 DIAGNOSIS — I5022 Chronic systolic (congestive) heart failure: Secondary | ICD-10-CM | POA: Diagnosis present

## 2022-07-12 DIAGNOSIS — Z1152 Encounter for screening for COVID-19: Secondary | ICD-10-CM | POA: Diagnosis not present

## 2022-07-12 DIAGNOSIS — F17210 Nicotine dependence, cigarettes, uncomplicated: Secondary | ICD-10-CM | POA: Diagnosis present

## 2022-07-12 DIAGNOSIS — F05 Delirium due to known physiological condition: Secondary | ICD-10-CM | POA: Diagnosis present

## 2022-07-12 DIAGNOSIS — G9341 Metabolic encephalopathy: Secondary | ICD-10-CM | POA: Diagnosis present

## 2022-07-12 DIAGNOSIS — G9340 Encephalopathy, unspecified: Secondary | ICD-10-CM | POA: Diagnosis present

## 2022-07-12 DIAGNOSIS — E1165 Type 2 diabetes mellitus with hyperglycemia: Secondary | ICD-10-CM | POA: Diagnosis present

## 2022-07-12 DIAGNOSIS — N400 Enlarged prostate without lower urinary tract symptoms: Secondary | ICD-10-CM | POA: Diagnosis present

## 2022-07-12 DIAGNOSIS — I4510 Unspecified right bundle-branch block: Secondary | ICD-10-CM | POA: Diagnosis not present

## 2022-07-12 DIAGNOSIS — K219 Gastro-esophageal reflux disease without esophagitis: Secondary | ICD-10-CM | POA: Diagnosis present

## 2022-07-12 DIAGNOSIS — E860 Dehydration: Secondary | ICD-10-CM | POA: Diagnosis present

## 2022-07-12 DIAGNOSIS — I255 Ischemic cardiomyopathy: Secondary | ICD-10-CM | POA: Diagnosis present

## 2022-07-12 DIAGNOSIS — N12 Tubulo-interstitial nephritis, not specified as acute or chronic: Secondary | ICD-10-CM | POA: Diagnosis present

## 2022-07-12 DIAGNOSIS — R7881 Bacteremia: Secondary | ICD-10-CM

## 2022-07-12 DIAGNOSIS — H9190 Unspecified hearing loss, unspecified ear: Secondary | ICD-10-CM | POA: Diagnosis present

## 2022-07-12 DIAGNOSIS — M1A9XX0 Chronic gout, unspecified, without tophus (tophi): Secondary | ICD-10-CM | POA: Diagnosis present

## 2022-07-12 DIAGNOSIS — D649 Anemia, unspecified: Secondary | ICD-10-CM | POA: Diagnosis present

## 2022-07-12 DIAGNOSIS — E0500 Thyrotoxicosis with diffuse goiter without thyrotoxic crisis or storm: Secondary | ICD-10-CM | POA: Diagnosis present

## 2022-07-12 DIAGNOSIS — Z833 Family history of diabetes mellitus: Secondary | ICD-10-CM

## 2022-07-12 DIAGNOSIS — Z66 Do not resuscitate: Secondary | ICD-10-CM | POA: Diagnosis not present

## 2022-07-12 DIAGNOSIS — Z681 Body mass index (BMI) 19 or less, adult: Secondary | ICD-10-CM | POA: Diagnosis not present

## 2022-07-12 DIAGNOSIS — M79605 Pain in left leg: Secondary | ICD-10-CM | POA: Diagnosis not present

## 2022-07-12 DIAGNOSIS — T83091A Other mechanical complication of indwelling urethral catheter, initial encounter: Secondary | ICD-10-CM | POA: Diagnosis present

## 2022-07-12 DIAGNOSIS — N186 End stage renal disease: Secondary | ICD-10-CM | POA: Diagnosis not present

## 2022-07-12 DIAGNOSIS — F0390 Unspecified dementia without behavioral disturbance: Secondary | ICD-10-CM | POA: Diagnosis present

## 2022-07-12 DIAGNOSIS — E8722 Chronic metabolic acidosis: Secondary | ICD-10-CM | POA: Diagnosis present

## 2022-07-12 DIAGNOSIS — Z79899 Other long term (current) drug therapy: Secondary | ICD-10-CM

## 2022-07-12 DIAGNOSIS — Z9049 Acquired absence of other specified parts of digestive tract: Secondary | ICD-10-CM

## 2022-07-12 DIAGNOSIS — R64 Cachexia: Secondary | ICD-10-CM | POA: Diagnosis present

## 2022-07-12 DIAGNOSIS — E119 Type 2 diabetes mellitus without complications: Secondary | ICD-10-CM

## 2022-07-12 DIAGNOSIS — M79672 Pain in left foot: Secondary | ICD-10-CM | POA: Diagnosis not present

## 2022-07-12 DIAGNOSIS — T83511A Infection and inflammatory reaction due to indwelling urethral catheter, initial encounter: Secondary | ICD-10-CM | POA: Diagnosis present

## 2022-07-12 DIAGNOSIS — R7401 Elevation of levels of liver transaminase levels: Secondary | ICD-10-CM | POA: Insufficient documentation

## 2022-07-12 DIAGNOSIS — Z515 Encounter for palliative care: Secondary | ICD-10-CM | POA: Diagnosis not present

## 2022-07-12 DIAGNOSIS — G8929 Other chronic pain: Secondary | ICD-10-CM | POA: Diagnosis present

## 2022-07-12 DIAGNOSIS — R739 Hyperglycemia, unspecified: Secondary | ICD-10-CM | POA: Diagnosis not present

## 2022-07-12 DIAGNOSIS — R0602 Shortness of breath: Secondary | ICD-10-CM | POA: Diagnosis not present

## 2022-07-12 DIAGNOSIS — N3289 Other specified disorders of bladder: Secondary | ICD-10-CM | POA: Diagnosis not present

## 2022-07-12 DIAGNOSIS — E1122 Type 2 diabetes mellitus with diabetic chronic kidney disease: Secondary | ICD-10-CM | POA: Diagnosis present

## 2022-07-12 DIAGNOSIS — M79671 Pain in right foot: Secondary | ICD-10-CM | POA: Diagnosis not present

## 2022-07-12 DIAGNOSIS — Y738 Miscellaneous gastroenterology and urology devices associated with adverse incidents, not elsewhere classified: Secondary | ICD-10-CM | POA: Diagnosis present

## 2022-07-12 DIAGNOSIS — R000 Tachycardia, unspecified: Secondary | ICD-10-CM | POA: Diagnosis not present

## 2022-07-12 DIAGNOSIS — Z888 Allergy status to other drugs, medicaments and biological substances status: Secondary | ICD-10-CM

## 2022-07-12 DIAGNOSIS — Z7984 Long term (current) use of oral hypoglycemic drugs: Secondary | ICD-10-CM

## 2022-07-12 LAB — BASIC METABOLIC PANEL
Anion gap: 14 (ref 5–15)
BUN: 140 mg/dL — ABNORMAL HIGH (ref 8–23)
CO2: 18 mmol/L — ABNORMAL LOW (ref 22–32)
Calcium: 9.3 mg/dL (ref 8.9–10.3)
Chloride: 114 mmol/L — ABNORMAL HIGH (ref 98–111)
Creatinine, Ser: 6.17 mg/dL — ABNORMAL HIGH (ref 0.61–1.24)
GFR, Estimated: 8 mL/min — ABNORMAL LOW (ref >60)
Glucose, Bld: 209 mg/dL — ABNORMAL HIGH (ref 70–99)
Potassium: 4.6 mmol/L (ref 3.5–5.1)
Sodium: 146 mmol/L — ABNORMAL HIGH (ref 135–145)

## 2022-07-12 LAB — CBC WITH DIFFERENTIAL/PLATELET
Abs Immature Granulocytes: 0.06 10*3/uL (ref 0.00–0.07)
Basophils Absolute: 0 10*3/uL (ref 0.0–0.1)
Basophils Relative: 0 %
Eosinophils Absolute: 0 10*3/uL (ref 0.0–0.5)
Eosinophils Relative: 0 %
HCT: 35.5 % — ABNORMAL LOW (ref 39.0–52.0)
Hemoglobin: 11.5 g/dL — ABNORMAL LOW (ref 13.0–17.0)
Immature Granulocytes: 0 %
Lymphocytes Relative: 4 %
Lymphs Abs: 0.6 10*3/uL — ABNORMAL LOW (ref 0.7–4.0)
MCH: 29 pg (ref 26.0–34.0)
MCHC: 32.4 g/dL (ref 30.0–36.0)
MCV: 89.6 fL (ref 80.0–100.0)
Monocytes Absolute: 0.9 10*3/uL (ref 0.1–1.0)
Monocytes Relative: 6 %
Neutro Abs: 12.8 10*3/uL — ABNORMAL HIGH (ref 1.7–7.7)
Neutrophils Relative %: 90 %
Platelets: 257 10*3/uL (ref 150–400)
RBC: 3.96 MIL/uL — ABNORMAL LOW (ref 4.22–5.81)
RDW: 14.1 % (ref 11.5–15.5)
WBC: 14.3 10*3/uL — ABNORMAL HIGH (ref 4.0–10.5)
nRBC: 0 % (ref 0.0–0.2)

## 2022-07-12 LAB — URINALYSIS, ROUTINE W REFLEX MICROSCOPIC
Bilirubin Urine: NEGATIVE
Glucose, UA: >=500 mg/dL — AB
Ketones, ur: NEGATIVE mg/dL
Nitrite: NEGATIVE
Protein, ur: 30 mg/dL — AB
Specific Gravity, Urine: 1.009 (ref 1.005–1.030)
WBC, UA: >50 WBC/hpf (ref 0–5)
pH: 7 (ref 5.0–8.0)

## 2022-07-12 LAB — COMPREHENSIVE METABOLIC PANEL
ALT: 89 U/L — ABNORMAL HIGH (ref 0–44)
AST: 151 U/L — ABNORMAL HIGH (ref 15–41)
Albumin: 2.1 g/dL — ABNORMAL LOW (ref 3.5–5.0)
Alkaline Phosphatase: 74 U/L (ref 38–126)
Anion gap: 19 — ABNORMAL HIGH (ref 5–15)
BUN: 147 mg/dL — ABNORMAL HIGH (ref 8–23)
CO2: 14 mmol/L — ABNORMAL LOW (ref 22–32)
Calcium: 8.9 mg/dL (ref 8.9–10.3)
Chloride: 106 mmol/L (ref 98–111)
Creatinine, Ser: 6.82 mg/dL — ABNORMAL HIGH (ref 0.61–1.24)
GFR, Estimated: 8 mL/min — ABNORMAL LOW (ref >60)
Glucose, Bld: 410 mg/dL — ABNORMAL HIGH (ref 70–99)
Potassium: 5 mmol/L (ref 3.5–5.1)
Sodium: 139 mmol/L (ref 135–145)
Total Bilirubin: 0.5 mg/dL (ref 0.3–1.2)
Total Protein: 6.6 g/dL (ref 6.5–8.1)

## 2022-07-12 LAB — RESP PANEL BY RT-PCR (RSV, FLU A&B, COVID)  RVPGX2
Influenza A by PCR: NEGATIVE
Influenza B by PCR: NEGATIVE
Resp Syncytial Virus by PCR: NEGATIVE
SARS Coronavirus 2 by RT PCR: NEGATIVE

## 2022-07-12 LAB — PROTIME-INR
INR: 1.3 — ABNORMAL HIGH (ref 0.8–1.2)
Prothrombin Time: 16 seconds — ABNORMAL HIGH (ref 11.4–15.2)

## 2022-07-12 LAB — GLUCOSE, CAPILLARY
Glucose-Capillary: 179 mg/dL — ABNORMAL HIGH (ref 70–99)
Glucose-Capillary: 192 mg/dL — ABNORMAL HIGH (ref 70–99)

## 2022-07-12 LAB — APTT: aPTT: 29 seconds (ref 24–36)

## 2022-07-12 LAB — LACTIC ACID, PLASMA
Lactic Acid, Venous: 2.7 mmol/L (ref 0.5–1.9)
Lactic Acid, Venous: 1.7 mmol/L (ref 0.5–1.9)

## 2022-07-12 MED ORDER — LACTATED RINGERS IV BOLUS (SEPSIS)
1000.0000 mL | Freq: Once | INTRAVENOUS | Status: AC
  Administered 2022-07-12: 1000 mL via INTRAVENOUS

## 2022-07-12 MED ORDER — METRONIDAZOLE 500 MG/100ML IV SOLN
500.0000 mg | Freq: Once | INTRAVENOUS | Status: AC
  Administered 2022-07-12: 500 mg via INTRAVENOUS
  Filled 2022-07-12: qty 100

## 2022-07-12 MED ORDER — VANCOMYCIN VARIABLE DOSE PER UNSTABLE RENAL FUNCTION (PHARMACIST DOSING): Status: DC

## 2022-07-12 MED ORDER — LACTATED RINGERS IV SOLN: INTRAVENOUS | Status: DC

## 2022-07-12 MED ORDER — HEPARIN SODIUM (PORCINE) 5000 UNIT/ML IJ SOLN
5000.0000 [IU] | Freq: Three times a day (TID) | INTRAMUSCULAR | Status: DC
  Administered 2022-07-12 – 2022-07-24 (×32): 5000 [IU] via SUBCUTANEOUS
  Filled 2022-07-12 (×32): qty 1

## 2022-07-12 MED ORDER — BUSPIRONE HCL 5 MG PO TABS
5.0000 mg | ORAL_TABLET | Freq: Three times a day (TID) | ORAL | Status: DC
  Administered 2022-07-12 – 2022-07-24 (×33): 5 mg via ORAL
  Filled 2022-07-12 (×36): qty 1

## 2022-07-12 MED ORDER — SODIUM CHLORIDE 0.9 % IV SOLN
1.0000 g | INTRAVENOUS | Status: DC
  Administered 2022-07-12 – 2022-07-14 (×3): 1 g via INTRAVENOUS
  Filled 2022-07-12 (×4): qty 10

## 2022-07-12 MED ORDER — SODIUM CHLORIDE 0.9 % IV SOLN: 2.0000 g | Freq: Once | INTRAVENOUS | Status: DC

## 2022-07-12 MED ORDER — VANCOMYCIN HCL 1250 MG/250ML IV SOLN
1250.0000 mg | Freq: Once | INTRAVENOUS | Status: AC
  Administered 2022-07-12: 1250 mg via INTRAVENOUS
  Filled 2022-07-12: qty 250

## 2022-07-12 MED ORDER — VANCOMYCIN HCL IN DEXTROSE 1-5 GM/200ML-% IV SOLN: 1000.0000 mg | Freq: Once | INTRAVENOUS | Status: DC

## 2022-07-12 MED ORDER — METOPROLOL SUCCINATE ER 25 MG PO TB24
12.5000 mg | ORAL_TABLET | Freq: Every day | ORAL | Status: DC
  Administered 2022-07-12 – 2022-07-24 (×12): 12.5 mg via ORAL
  Filled 2022-07-12 (×13): qty 1

## 2022-07-12 MED ORDER — ATORVASTATIN CALCIUM 80 MG PO TABS
80.0000 mg | ORAL_TABLET | Freq: Every day | ORAL | Status: DC
  Administered 2022-07-12 – 2022-07-21 (×8): 80 mg via ORAL
  Filled 2022-07-12 (×9): qty 1

## 2022-07-12 MED ORDER — INSULIN ASPART 100 UNIT/ML IJ SOLN
0.0000 [IU] | Freq: Three times a day (TID) | INTRAMUSCULAR | Status: DC
  Administered 2022-07-13: 1 [IU] via SUBCUTANEOUS
  Administered 2022-07-13 – 2022-07-14 (×4): 2 [IU] via SUBCUTANEOUS
  Administered 2022-07-14: 3 [IU] via SUBCUTANEOUS
  Administered 2022-07-15 – 2022-07-16 (×3): 2 [IU] via SUBCUTANEOUS
  Administered 2022-07-17 – 2022-07-19 (×5): 1 [IU] via SUBCUTANEOUS
  Administered 2022-07-19: 2 [IU] via SUBCUTANEOUS
  Administered 2022-07-20: 1 [IU] via SUBCUTANEOUS
  Administered 2022-07-20: 3 [IU] via SUBCUTANEOUS
  Administered 2022-07-20: 2 [IU] via SUBCUTANEOUS
  Administered 2022-07-21: 3 [IU] via SUBCUTANEOUS
  Administered 2022-07-21: 1 [IU] via SUBCUTANEOUS
  Administered 2022-07-22 (×3): 2 [IU] via SUBCUTANEOUS
  Administered 2022-07-23 (×2): 5 [IU] via SUBCUTANEOUS
  Administered 2022-07-23 – 2022-07-24 (×2): 2 [IU] via SUBCUTANEOUS

## 2022-07-12 NOTE — Hospital Course (Addendum)
[name] is an 82 y.o. male who presented from SNF with altered mental status and decreased output from chronic foley. He was admitted to [facility] for sepsis 2/2 urinary source.  Acute Renal Failure Encephalopathy  Presented with creatinine 6.82 from baseline ~1. Thought to be multifactorial: post-renal secondary to clogged foley catheter as well as a pre-renal component secondary to sepsis. Foley replaced and sepsis treated as above with no improvement in renal function. Nephro consulted and recommended trial of HD from 2/9 - 2/14. Encephalopathy due to uremia secondary to acute renal failure. Patient had BUN of 147 max. No further plans for dialysis as patient now comfort care.  Sepsis  Pyelonephritis  Urinary Retention w/ Chronic Indwelling Foley Pt presented from SNF w/ 1 day hx of no output from foley and AMS. Initially, met SIRs criteria and had elevated lactic acid. Foley was replaced and drained  Secondary to urinary source. Blood culture grew ESBL. Infectious disease was consulted and recommended meropenem for 7 day course which patient completed. Indwelling foley still in-place.  Chronic HFrEF Echo 2022 showed EF 20-25%. Heart medications dc'd as now comfort care.  DM II Insulin and CBGs d/c'd as now comfort care  PCP issues requiring follow-up None as patient now comfort care and hospice-bound

## 2022-07-12 NOTE — Assessment & Plan Note (Addendum)
Echo 2022 showed EF 20-25%. - d/c'd heart failure medications as he is now comfort care

## 2022-07-12 NOTE — ED Provider Notes (Addendum)
[facility] Provider Note   CSN: [id number] Arrival date & time: 07/12/22  1058     History  Chief Complaint  Patient presents with   Weakness    [name] is a 82 y.o. male.  Patient is a 82 year old male who presents with decreased urine output.  He has a history of CHF, dementia, hearing impairment and BPH.  Per chart review, he lives at [facility] facility.  They state that he has had some increased weakness as well as some decreased urine output.  He does have an indwelling Foley catheter and they said it has not been draining since yesterday.  He was admitted in November of last year for urinary retention and AKI.  He had his Foley catheter placed at that time.  History is limited due to his dementia.       Home Medications Prior to Admission medications   Medication Sig Start Date End Date Taking? Authorizing Provider  acetaminophen (TYLENOL) 325 MG tablet Take 650 mg by mouth every 4 (four) hours as needed (pain, fever 100F or above).   Yes [provider]  aspirin 81 MG chewable tablet Chew 1 tablet (81 mg total) by mouth daily. 08/28/20  Yes Paige, Victoria J, DO  atorvastatin (LIPITOR) 80 MG tablet Take 1 tablet (80 mg total) by mouth at bedtime. 01/12/22  Yes Conte, Tessa N, PA-C  busPIRone (BUSPAR) 5 MG tablet Take 5 mg by mouth 3 (three) times daily.   Yes [provider]  ciprofloxacin (CIPRO) 250 MG tablet Take 250 mg by mouth 2 (two) times daily. 7 day course (07/11/22-07/17/22)   Yes [provider]  empagliflozin (JARDIANCE) 10 MG TABS tablet Take 1 tablet (10 mg total) by mouth daily before breakfast. 01/12/22  Yes Harriet Pho, Tessa N, PA-C  furosemide (LASIX) 40 MG tablet Take 0.5 tablets (20 mg total) by mouth daily as needed. Patient taking differently: Take 20 mg by mouth See admin instructions. 2 entries on MAR: 20 mg once daily in the morning (for 4 days, 07/11/22-07/15/22). May take an  additional 20 mg during the day if needed (diuretic). 05/06/22  Yes Alen Bleacher, MD  guaiFENesin (GERI-TUSSIN) 100 MG/5ML liquid Take 200 mg by mouth every 4 (four) hours as needed for cough.   Yes [provider]  ipratropium-albuterol (DUONEB) 0.5-2.5 (3) MG/3ML SOLN Take 3 mLs by nebulization every 4 (four) hours as needed (shortness of breath, wheezing).   Yes [provider]  metoprolol succinate (TOPROL-XL) 25 MG 24 hr tablet Take 0.5 tablets (12.5 mg total) by mouth daily. 05/06/22  Yes Alen Bleacher, MD  Nutritional Supplements (NUTRITIONAL DRINK) LIQD Take 120 mLs by mouth 2 (two) times daily. Med Pass 2.0.   Yes [provider]  Probiotic Product (PROBIOTIC PO) Take 1 capsule by mouth 2 (two) times daily. While on antibiotics   Yes [provider]  cefTRIAXone 250 mg/mL in lidocaine (with preservative) injection Inject 1 g into the muscle once. Patient not taking: Reported on 07/12/2022    [provider]  nicotine (NICODERM CQ - DOSED IN MG/24 HR) 7 mg/24hr patch Place 1 patch (7 mg total) onto the skin daily. Patient not taking: Reported on 07/12/2022 05/06/22   Alen Bleacher, MD  sodium chloride 0.9 % infusion Inject 100 mL/hr into the vein See admin instructions. Sodium Chloride 0.9% IV - 100 ml/hr IV for 24 hours. Patient not taking: Reported on 07/12/2022    [provider]      Allergies    Ace inhibitors and Diovan [valsartan]    Review of Systems   Review of Systems  Unable to perform ROS: Dementia    Physical Exam Updated Vital Signs BP 116/72   Pulse (!) 119   Temp 100.3 F (37.9 C) (Rectal)   Resp (!) 25   Ht 6' (1.829 m)   Wt 59.1 kg   SpO2 98%   BMI 17.66 kg/m  Physical Exam Constitutional:      Appearance: He is well-developed.  HENT:     Head: Normocephalic and atraumatic.  Eyes:     Pupils: Pupils are equal, round, and reactive to light.  Cardiovascular:     Rate and Rhythm: Normal rate and regular  rhythm.     Heart sounds: Normal heart sounds.  Pulmonary:     Effort: Pulmonary effort is normal. No respiratory distress.     Breath sounds: Normal breath sounds. No wheezing or rales.  Chest:     Chest wall: No tenderness.  Abdominal:     General: Bowel sounds are normal.     Palpations: Abdomen is soft.     Tenderness: There is abdominal tenderness (Tenderness across the lower abdomen). There is no guarding or rebound.  Musculoskeletal:        General: Normal range of motion.     Cervical back: Normal range of motion and neck supple.  Lymphadenopathy:     Cervical: No cervical adenopathy.  Skin:    General: Skin is warm and dry.     Findings: No rash.  Neurological:     General: No focal deficit present.     Mental Status: He is alert.     Comments: Oriented to person only.  He moves all extremities symmetrically without obvious focal deficits     ED Results / Procedures / Treatments   Labs (all labs ordered are listed, but only abnormal results are displayed) Labs Reviewed  LACTIC ACID, PLASMA - Abnormal; Notable for the following components:      Result Value   Lactic Acid, Venous 2.7 (*)    All other components within normal limits  COMPREHENSIVE METABOLIC PANEL - Abnormal; Notable for the following components:   CO2 14 (*)    Glucose, Bld 410 (*)    BUN 147 (*)    Creatinine, Ser 6.82 (*)    Albumin 2.1 (*)    AST 151 (*)    ALT 89 (*)    GFR, Estimated 8 (*)    Anion gap 19 (*)    All other components within normal limits  CBC WITH DIFFERENTIAL/PLATELET - Abnormal; Notable for the following components:   WBC 14.3 (*)    RBC 3.96 (*)    Hemoglobin 11.5 (*)    HCT 35.5 (*)    Neutro Abs 12.8 (*)    Lymphs Abs 0.6 (*)    All other components within normal limits  PROTIME-INR - Abnormal; Notable for the following components:   Prothrombin Time 16.0 (*)    INR 1.3 (*)    All other components within normal limits  URINALYSIS, ROUTINE W REFLEX MICROSCOPIC -  Abnormal; Notable for the following components:   APPearance HAZY (*)    Glucose, UA >=500 (*)    Hgb urine dipstick MODERATE (*)    Protein, ur 30 (*)    Leukocytes,Ua LARGE (*)    Bacteria, UA MANY (*)    All other components within normal limits  CULTURE, BLOOD (ROUTINE X 2)  CULTURE, BLOOD (ROUTINE X 2)  URINE CULTURE  LACTIC ACID, PLASMA  APTT    EKG EKG Interpretation  Date/Time:  Monday July 12 2022 11:07:20 EST Ventricular Rate:  106 PR Interval:  160 QRS Duration: 126 QT Interval:  378 QTC Calculation: 502 R Axis:   -60 Text Interpretation: Sinus tachycardia Nonspecific IVCD with LAD Left ventricular hypertrophy Anterior Q waves, possibly due to LVH Confirmed by Malvin Johns [phone]) on 07/12/2022 1:42:54 PM  Radiology DG Chest Port 1 View  Result Date: 07/12/2022 CLINICAL DATA:  Weakness.  Questionable sepsis.  Cognitive decline. EXAM: PORTABLE CHEST 1 VIEW COMPARISON:  04/30/2022. FINDINGS: Cardiac silhouette normal in size and configuration. Enlarged superior mediastinum, most evident on the right, consistent with thyroid gland enlargement, stable from the prior exam. Remaining mediastinal and hilar contours unremarkable. Clear lungs.  No pleural effusion or pneumothorax. Skeletal structures are grossly intact. IMPRESSION: 1. No active disease. 2. Chronically enlarged thyroid gland. This was previously assessed with ultrasound on 12/19/2008, followed up with ultrasound on 10/15/2020. Electronically Signed   By: Lajean Manes M.D.   On: 07/12/2022 11:51    Procedures Procedures    Medications Ordered in ED Medications  lactated ringers infusion (has no administration in time range)  vancomycin (VANCOREADY) IVPB 1250 mg/250 mL (1,250 mg Intravenous New Bag/Given 07/12/22 1337)  ceFEPIme (MAXIPIME) 1 g in sodium chloride 0.9 % 100 mL IVPB (1 g Intravenous New Bag/Given 07/12/22 1404)  vancomycin variable dose per unstable renal function (pharmacist dosing) (has no  administration in time range)  lactated ringers bolus 1,000 mL (1,000 mLs Intravenous New Bag/Given 07/12/22 1332)  metroNIDAZOLE (FLAGYL) IVPB 500 mg (500 mg Intravenous New Bag/Given 07/12/22 1343)    ED Course/ Medical Decision Making/ A&P                             Medical Decision Making Amount and/or Complexity of Data Reviewed Labs: ordered. Radiology: ordered. ECG/medicine tests: ordered.  Risk Prescription drug management. Decision regarding hospitalization.   Patient is a 82 year old male who presents with decreased urine output.  Bladder scan in the ED revealed over 700 cc of urine.  The Foley catheter was removed and a new 1 was placed.  The urine was collected from the new catheter.  He has clinical suggestions of sepsis with a noted fever of 100.3, tachycardia and tachypnea.  He was started on sepsis protocol and given IV fluids and IV antibiotics.  It appears that he is in acute renal failure with a creatinine of 6.82.  He has some mild elevation of his LFTs although they have been elevated in the past.  His glucose is elevated at 410.  His potassium is normal.  His white count is elevated at 14,000.  He had mild elevation in his lactate at 2.7 although this has cleared after fluids.  Patient will need to be admitted for severe sepsis with acute renal failure.  CT scan shows probable pyelonephritis and a questionable pneumonia.  I spoke with the family medicine resident who admit the patient for further treatment.  CRITICAL CARE Performed by: Malvin Johns Total critical care time: 60 minutes Critical care time was exclusive of separately billable procedures and treating other patients. Critical care was necessary to treat or prevent imminent or life-threatening deterioration. Critical care was time spent personally by me on the following activities: development of treatment plan with patient and/or surrogate as  well as nursing, discussions with consultants, evaluation of  patient's response to treatment, examination of patient, obtaining history from patient or surrogate, ordering and performing treatments and interventions, ordering and review of laboratory studies, ordering and review of radiographic studies, pulse oximetry and re-evaluation of patient's condition.   Final Clinical Impression(s) / ED Diagnoses Final diagnoses:  Weakness  Sepsis with acute renal failure without septic shock, due to unspecified organism, unspecified acute renal failure type ([place])  Urinary tract infection associated with indwelling urethral catheter, initial encounter [facility])    Rx / [place] Orders ED Discharge Orders     None         Malvin Johns, MD 07/12/22 1454    Malvin Johns, MD 07/12/22 1558

## 2022-07-12 NOTE — Assessment & Plan Note (Addendum)
-  d/c'd insulin and CBGs as he is now comfort care.

## 2022-07-12 NOTE — Progress Notes (Signed)
Pharmacy Antibiotic Note  [name] is a 82 y.o. male admitted on 07/12/2022 with sepsis. Brought to ED from SNF for concerns of decreased urine output and kidney failure (no prior history of kidney disease). Pharmacy has been consulted for Cefepime and Vancomycin dosing.  WBC 14.3, Temp 97.5, LA 2.7, HR 100, RR 27 SCr 6.82 (baseline SCr 1.12 on 05/04/22)  Plan: Initiate Cefepime 1g IV q24h  Initiate loading dose of Vancomycin '1250mg'$  IV x 1, followed by  Vancomycin variable dosing per levels Monitor daily CBC, temp, SCr, and for clinical signs of improvement  F/u cultures and de-escalate antibiotics as able   Height: 6' (182.9 cm) Weight: 59.1 kg (130 lb 3.2 oz) IBW/kg (Calculated) : 77.6  Temp (24hrs), Avg:97.5 F (36.4 C), Min:97.5 F (36.4 C), Max:97.5 F (36.4 C)  Recent Labs  Lab 07/12/22 1106 07/12/22 1115  WBC  --  14.3*  CREATININE  --  6.82*  LATICACIDVEN 2.7*  --     Estimated Creatinine Clearance: 7 mL/min (A) (by C-G formula based on SCr of 6.82 mg/dL (H)).    Allergies  Allergen Reactions   Ace Inhibitors Swelling and Other (See Comments)    Site of swelling not known by the patient   Valsartan Swelling and Other (See Comments)    Site of swelling not known by the patient    Antimicrobials this admission: Cefepime 2/5 >>  Vancomycin 2/5 >>  Metronidazole 2/5 >>   Dose adjustments this admission: N/A  Microbiology results: 2/5 BCx: pending  Thank you for allowing pharmacy to be a part of this patient's care.  Luisa Hart, PharmD, BCPS Clinical Pharmacist 07/12/2022 12:42 PM   Please refer to [facility] for pharmacy phone number

## 2022-07-12 NOTE — Progress Notes (Signed)
Called daughter [name] [facility]). She reports that he is Full code (pt is currently altered and unable to answer himself). She was called yesterday from nursing facility that he had an infection and was acting abnormal. He had fallen on his butt yesterday, but it was not a serious fall. They started him on antibiotics for UTI yesterday. She reports that he gets confused when he gets UTI's. She then got a few calls this morning to notify her that he was being sent to ED.

## 2022-07-12 NOTE — ED Notes (Addendum)
Bladder scan performed. Greater than 784 ml in bladder. MD made aware.

## 2022-07-12 NOTE — Sepsis Progress Note (Signed)
Code Sepsis protocol being monitored by e[facility]. 

## 2022-07-12 NOTE — Assessment & Plan Note (Addendum)
-   No further plans for dialysis at this time. Dialysis catheter discontinued 2/16) - Nephrology consulted, appreciate recs - Indwelling foley - Strict I/O's - Daily RFP - Avoid nephrotoxic agents

## 2022-07-12 NOTE — H&P (Addendum)
Hospital Admission History and Physical Service Pager: [phone]  Patient name: [name] Medical record number: [id number] Date of Birth: 11-22-1940 Age: 82 y.o. Gender: male  Primary Care Provider: [name], DO Consultants: None Code Status: Full (which was confirmed with family if patient unable to confirm) Preferred Emergency Contact: [name]  Contact Information     Name Relation Home Work Sattley, Wardell Daughter   [phone]   Achilles, Neville   [phone]        Chief Complaint: Sepsis  Assessment and Plan: [name] is a 82 y.o. male presenting with sepsis. Differential for this patient's presentation of this includes UTI, PNA, intra-abdominal infxn. Urinary source is most likely given recent urinary retention from foley malfunction, U/A findings c/f infxn, CT c/f BL pyelo, and CVA tenderness. PNA is considered given patchy infiltrate of L lung base on CT, but less likely given unremarkable lung exam and normal saturation on RA. Other intra-abm sources are less likely given benign abm exam and CTAP.   * Sepsis ([place]) Met SIRS criteria on admission, received 1L LR bolus and cefepime, vanc, and flagyl. Lactic acid now wnl s/p fluid resuscitation. Most likely Urosepsis but will continue broad spectrum ON and narrow as appropriate tomorrow.  - Admit to FMTS Progressive w/ attending Dr. Ardelia Mems - Cont Cefepime, Flagyl, and Vancomycin. (Likely will d/c Vanc tomorrow) - Tylenol prn for fever - Cont LR mIVF 100cc/hr for 12 hours (caution w/ IVF given HFrEF) - Resp swab - Foley cath replaced - f/u Bcx, Ucx - AM CBC, CMP - Strict I/O's and daily weights - Cardiac monitoring  AKI (acute kidney injury) (HCC) Cr 6.82 (baseline ~1.1-1.3). Most likely multifactorial including pre-renal (poor perfusion, dehydration, sepsis), post-renal (BPH, foley malfunction), and intra-renal (pyelonephritis).  - Cont IVF as above - Foley replaced  and draining - Tx pyelo as above  Encephalopathy Acute AMS starting yesterday. Pt seems less alert and mumbling, sometimes not making sense when he speaks. A and Ox2, not oriented to time (this seems to be his baseline orientation, comparing to neuro exams from 04/2022 admission). Suspect 2/2 metabolic derangement from sepsis. DKA is also considered, but less likely (discussed below). 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 Hep B and C workup at prior 04/2022 admission negative. Denies alcohol use.  -Monitor on AM CMP  Pyelonephritis U/A, exam, and CT findings c/f BL pyelonephritis, especially 2/2 urinary retention 2/2 indwelling foley malfunction. Of note, pt does have hx of asymptomatic bacturia and his current UA is very similar to his prior. Prior cultures have only grown contamination. Pt's current presentation does support a true urinary infection, will plan to continue antibx and f/u Ucx. - Cont antibx as above - f/u Ucx  Chronic HFrEF (heart failure with reduced ejection fraction) ([place]) Echo 2022 showed EF 20-25%. Metop was decreased to 12.5 daily during 04/2022 admission due to bradycardia. Euvolemic on exam and on RA. - Cont home metop and jardiance - Hold Lasix given AKI  T2DM (type 2 diabetes mellitus) ([place]) BG elevated to 400's on admission. Anion gap elevated, but more likely 2/2 acute renal failure than DKA. Urine neg for ketones. Per SNF, he takes Jardiance but no record of insulin. A1c 6.3 04/2022.  - Start sSSI. Pt is NPO currently so will hold off on more aggressive glucose control.  - Cont Jardiance - AM A1c  FEN/GI: NPO until bedside swallow VTE Prophylaxis: SQ Hep  Disposition: Progressive  History of Present Illness:  Reymond [name] is a 82 y.o. male presenting with AMS and urinary retention. Pt is altered and minimally able to provide history. 47 of history is from daughter  [name]) and nursing facility. Pt was acting unusual yesterday while at SNF and his indwelling foley cath had stopped draining urine. Daughter was notified by SNF that he was started on antibiotics (cipro) for UTI. Then this AM, he got worse and was transferred to ED.  Pt currently denies SOB and productive cough. Denies any abm pain.   In ED, pt was tachy and a borderline high temp (100.3). WBC elevated and Cr elevated. Cath replaced and drained >700cc urine. U/A c/f infxn. CXR unremarkable. CTAP shows potential BL pyelonephritis.  Lactic acid elevated to 2.7. Pt started on Vanc, cefepime, flagyl and 1L LR bolus. Lactic acid subsequently downtrended.  Pertinent Past Medical History: BPH Diabetes Gout HLD HTN  Remainder reviewed in history tab.   Pertinent Past Surgical History: Adenoidectomy Cholecystectomy EGD, colonoscopy Tonsillectomy   Remainder reviewed in history tab.  Pertinent Social History: Tobacco use: Yes at prior admission Alcohol use: denied at prior admission Other Substance use: Denied at prior admission Lives in nursing facility  Pertinent Family History: diabetes  Remainder reviewed in history tab.   Important Outpatient Medications: Tylenol as needed Buspirone 5 mg TID Cipro-started yesterday by facility for presumed UTI Metoprolol 25 mg daily Atorvastatin 80 mg Jardiance 10 mg Furosemide-none Remainder reviewed in medication history.   Objective: BP (!) 134/123   Pulse (!) 122   Temp 98.1 F (36.7 C) (Oral)   Resp (!) 22   Ht 6' (1.829 m)   Wt 59.1 kg   SpO2 95%   BMI 17.66 kg/m  Exam: General: Chronically ill appearing older man, cachetic. NAD. Hard of hearing and mumbling when he talks, difficult to understand him.  HEENT: NCAT. Dry mucous membranes. Cardiovascular: RRR, no murmurs. Cap refill <2.  Respiratory: CTAB, no crackles or wheezing. Normal WOB on RA. Gastrointestinal: Soft, nontender, nondistended. Normal BS. CVA tenderness  noted BL.  Derm: decreased skin turgor Neuro: alert and oriented to person and place ("[place]"), not to year.   Labs:  CBC BMET  Recent Labs  Lab 07/12/22 1115  WBC 14.3*  HGB 11.5*  HCT 35.5*  PLT 257   Recent Labs  Lab 07/12/22 1115  NA 139  K 5.0  CL 106  CO2 14*  BUN 147*  CREATININE 6.82*  GLUCOSE 410*  CALCIUM 8.9     EKG: My own interpretation (not copied from electronic read) sinus tachy, narrow QRS, no ST changes    Imaging Studies Performed:  CXR 1. No active disease. 2. Chronically enlarged thyroid gland. This was previously assessed with ultrasound on 12/19/2008, followed up with ultrasound on 10/15/2020.   CTAP 1. Patchy infiltrate at the left lung base consistent with mild bronchopneumonia and volume loss. 2. Generalized swelling of both kidneys suggesting acute nephritis. Bilateral infectious pyelonephritis is not excluded. Mild fullness of the renal collecting system on both sides. 3. Foley catheter in the bladder. Thick walled bladder as seen chronically. 4. Aortic atherosclerosis. 5. Chronically prominent prostate gland. 6. Previous right hemicolectomy and reanastomosis.   Arlyce Dice MD 07/12/2022, 6:52 PM PGY-1, [facility] Intern pager: [phone], text pages welcome Secure chat group [facility] Teaching Service   Upper Level Addendum:  I have  seen and evaluated this patient along with Dr. Markus Jarvis and reviewed the above note, making necessary revisions as appropriate.  I agree with the medical decision making and physical exam as noted above.  Gerrit Heck, MD PGY-2 [facility] Family Medicine Residency

## 2022-07-12 NOTE — Assessment & Plan Note (Deleted)
This is chronic and stable. Hep B and C workup at prior 04/2022 admission negative. Denies alcohol use.  -Monitor on AM CMP

## 2022-07-12 NOTE — Assessment & Plan Note (Deleted)
Most likely urinary source, will tx pyelo (see below). Mentation improving. Cr still elevated. - Tx AKI as below - f/u Bcx (NGTD <24hr) and Ucx

## 2022-07-12 NOTE — ED Triage Notes (Signed)
PT BIBGEMS from [place] [facility] for decreased urine output and concern for kidney failure (no hx of), denies pain, "feels tired and weak." Foley bag changed at 2000 on 07/11/22 but no output since. Eating and drinking at baseline.  Hx chronic uti, dementia acting baseline  Tachypnea 28 120 hr 94/62 430 CBG 20-25 ETCo2  Subq fluids 1,000 ml since last night at 2130.

## 2022-07-12 NOTE — Progress Notes (Signed)
FMTS Brief Progress Note  S: patient was speaking in a low volume and was often slurring words so he was difficult to understand. He spells out his first and last name and says he is in [place]. He says "39" when asked the year. He mumbles when asked how he came to the hospital. He says he would like help with feeding.  O: BP (!) 134/123   Pulse (!) 122   Temp 98.1 F (36.7 C) (Oral)   Resp (!) 22   Ht 6' (1.829 m)   Wt 130 lb 3.2 oz (59.1 kg)   SpO2 95%   BMI 17.66 kg/m   General: in no acute distress. Somnolent but rouses to voice. Oriented only to self, place. HEENT: normocephalic and atraumatic Respiratory: normal respiratory effort and on RA Extremities: moving all extremities spontaneously Gastrointestinal: non-tender and non-distended Cardiovascular: regular rate  A/P: - Orders reviewed. Labs for AM ordered, which was adjusted as needed.  - nursing order for assist with feeds placed  Camelia Phenes, MD 07/12/2022, 9:12 PM PGY-1, [facility] Family Medicine Night Resident  Please page [phone] with questions.

## 2022-07-12 NOTE — ED Notes (Signed)
Help get patient on the monitor patient is resting with call bell in reach 

## 2022-07-12 NOTE — ED Notes (Signed)
ED TO INPATIENT HANDOFF REPORT  ED Nurse Name and Phone #: Eartha Inch RN [phone]  S Name/Age/Gender [name] 82 y.o. male Room/Bed: 024C/024C  Code Status   Code Status: Prior  Home/SNF/Other Home Patient oriented to: self, place, time, and situation Is this baseline? Yes   Triage Complete: Triage complete  Chief Complaint Sepsis [facility]) [A41.9]  Triage Note PT BIBGEMS from [place] for decreased urine output and concern for kidney failure (no hx of), denies pain, "feels tired and weak." Foley bag changed at 2000 on 07/11/22 but no output since. Eating and drinking at baseline.  Hx chronic uti, dementia acting baseline  Tachypnea 28 120 hr 94/62 430 CBG 20-25 ETCo2  Subq fluids 1,000 ml since last night at 2130.    Allergies Allergies  Allergen Reactions   Ace Inhibitors Swelling and Other (See Comments)    Site of swelling not known by the patient   Diovan [Valsartan] Swelling and Other (See Comments)    Site of swelling not known by the patient    Level of Care/Admitting Diagnosis ED Disposition     ED Disposition  Admit   Condition  --   [place]: [place] [[id number]]  Level of Care: Progressive [102]  Admit to Progressive based on following criteria: MULTISYSTEM THREATS such as stable sepsis, metabolic/electrolyte imbalance with or without encephalopathy that is responding to early treatment.  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 07/12/22  1140  Latex  less than 1  Intake/Output Last 24 hours  Intake/Output Summary (Last 24 hours) at 07/12/2022 1610 Last data filed at 07/12/2022 1600 Gross per 24 hour  Intake 1450 ml  Output 1200 ml  Net 250 ml    Labs/Imaging Results for orders placed or performed during the hospital encounter of 07/12/22 (from the past 48 hour(s))  Lactic acid, plasma     Status: Abnormal   Collection Time: 07/12/22 11:06 AM  Result Value Ref Range   Lactic Acid, Venous 2.7 (HH) 0.5 - 1.9 mmol/L    Comment: CRITICAL RESULT CALLED TO, READ BACK BY AND VERIFIED WITH Shirlee Latch, RN [phone] 07/12/22 L. KLAR Performed at [facility], [place] [address], [place], [place] [address]    Comprehensive metabolic panel     Status: Abnormal   Collection Time: 07/12/22 11:15 AM  Result Value Ref Range   Sodium 139 135 - 145 mmol/L   Potassium 5.0 3.5 - 5.1 mmol/L   Chloride 106 98 - 111 mmol/L   CO2 14 (L) 22 - 32 mmol/L   Glucose, Bld 410 (H) 70 - 99 mg/dL    Comment: Glucose reference range applies only to samples taken after fasting for at least 8 hours.   BUN 147 (H) 8 - 23 mg/dL   Creatinine, Ser 6.82 (H) 0.61 - 1.24 mg/dL   Calcium 8.9 8.9 - 10.3 mg/dL   Total Protein 6.6 6.5 - 8.1 g/dL   Albumin 2.1 (L) 3.5 - 5.0 g/dL   AST 151 (H) 15 - 41 U/L   ALT 89 (H) 0 - 44 U/L   Alkaline Phosphatase 74 38 - 126 U/L   Total Bilirubin 0.5 0.3 - 1.2 mg/dL   GFR, Estimated 8 (L) >60 mL/min    Comment: (NOTE) Calculated using the CKD-EPI Creatinine Equation (2021)    Anion gap 19 (H) 5 - 15    Comment: Performed at [facility], [place] [address] [place], [place] [address]  CBC with Differential     Status: Abnormal   Collection Time: 07/12/22 11:15 AM  Result Value Ref Range   WBC 14.3 (H) 4.0 - 10.5 K/uL   RBC 3.96 (L) 4.22 - 5.81 MIL/uL   Hemoglobin 11.5 (L) 13.0 - 17.0 g/dL   HCT 35.5 (L) 39.0 - 52.0 %   MCV 89.6 80.0 - 100.0 fL   MCH 29.0 26.0 - 34.0 pg   MCHC 32.4 30.0 - 36.0 g/dL   RDW 14.1 11.5 - 15.5 %   Platelets 257 150 - 400 K/uL   nRBC 0.0 0.0 - 0.2 %   Neutrophils Relative % 90 %   Neutro Abs 12.8 (H) 1.7 - 7.7 K/uL   Lymphocytes Relative 4 %   Lymphs Abs 0.6 (L) 0.7 - 4.0 K/uL   Monocytes Relative 6 %   Monocytes Absolute 0.9 0.1 - 1.0 K/uL   Eosinophils Relative 0 %   Eosinophils Absolute 0.0 0.0 - 0.5 K/uL   Basophils Relative 0 %   Basophils Absolute 0.0 0.0 - 0.1 K/uL   Immature Granulocytes 0 %   Abs Immature Granulocytes 0.06 0.00 - 0.07 K/uL    Comment: Performed at [facility], [place] [address], [place], [place] [address]  Protime-INR     Status: Abnormal   Collection Time: 07/12/22 11:15 AM  Result Value Ref Range    Prothrombin Time 16.0 (H) 11.4 - 15.2 seconds   INR 1.3 (H) 0.8 - 1.2    Comment: (NOTE) INR goal  varies based on device and disease states. Performed at [facility], [place] [address], [place], [place] [address]   APTT     Status: None   Collection Time: 07/12/22 11:15 AM  Result Value Ref Range   aPTT 29 24 - 36 seconds    Comment: Performed at [place] [address] [place], [place] [address]  Urinalysis, Routine w reflex microscopic -Urine, Catheterized; Indwelling urinary catheter     Status: Abnormal   Collection Time: 07/12/22 11:15 AM  Result Value Ref Range   Color, Urine YELLOW YELLOW   APPearance HAZY (A) CLEAR   Specific Gravity, Urine 1.009 1.005 - 1.030   pH 7.0 5.0 - 8.0   Glucose, UA >=500 (A) NEGATIVE mg/dL   Hgb urine dipstick MODERATE (A) NEGATIVE   Bilirubin Urine NEGATIVE NEGATIVE   Ketones, ur NEGATIVE NEGATIVE mg/dL   Protein, ur 30 (A) NEGATIVE mg/dL   Nitrite NEGATIVE NEGATIVE   Leukocytes,Ua LARGE (A) NEGATIVE   RBC / HPF 21-50 0 - 5 RBC/hpf   WBC, UA >50 0 - 5 WBC/hpf   Bacteria, UA MANY (A) NONE SEEN   Squamous Epithelial / HPF 0-5 0 - 5 /HPF   WBC Clumps PRESENT     Comment: Performed at [facility], [address], [place], [place] [address]  Lactic acid, plasma     Status: None   Collection Time: 07/12/22  1:15 PM  Result Value Ref Range   Lactic Acid, Venous 1.7 0.5 - 1.9 mmol/L    Comment: Performed at [place] [address], [place], [place] [address]   CT ABDOMEN PELVIS WO CONTRAST  Result Date: 07/12/2022 CLINICAL DATA:  Abdominal pain, acute, nonlocalized. EXAM: CT ABDOMEN AND PELVIS WITHOUT CONTRAST TECHNIQUE: Multidetector CT imaging of the abdomen and pelvis was performed following the standard protocol without IV contrast. RADIATION DOSE REDUCTION: This exam was performed according to the departmental dose-optimization program which includes automated exposure control, adjustment of the mA and/or kV  according to patient size and/or use of iterative reconstruction technique. COMPARISON:  12/25/2021 FINDINGS: Lower chest: Patchy infiltrate at the left lung base consistent with mild bronchopneumonia and volume loss. Hepatobiliary: Liver parenchyma appears normal without contrast. Pancreas: Normal Spleen: Normal Adrenals/Urinary Tract: Adrenal glands are normal. Both kidneys show generalized swelling compared to the previous study suggesting acute nephritis. Bilateral pyelonephritis is not excluded. There is mild fullness of the renal collecting system on both sides. Multiple renal cysts are noted as seen previously, few showing slight hyperdensity. No specific follow-up recommended. There is a Foley catheter in the bladder. The bladder is thick walled as seen chronically. Stomach/Bowel: Stomach and small intestine are normal. No significant: Finding. Previous right hemicolectomy and reanastomosis. Vascular/Lymphatic: Aortic atherosclerosis. No aneurysm. IVC is normal. No significant adenopathy. Reproductive: Chronically prominent prostate gland. Other: No free fluid or air. Musculoskeletal: Ordinary chronic lumbar degenerative changes. IMPRESSION: 1. Patchy infiltrate at the left lung base consistent with mild bronchopneumonia and volume loss. 2. Generalized swelling of both kidneys suggesting acute nephritis. Bilateral infectious pyelonephritis is not excluded. Mild fullness of the renal collecting system on both sides. 3. Foley catheter in the bladder. Thick walled bladder as seen chronically. 4. Aortic atherosclerosis. 5. Chronically prominent prostate gland. 6. Previous right hemicolectomy and reanastomosis. Aortic Atherosclerosis (ICD10-I70.0). Electronically Signed   By: Nelson Chimes M.D.   On: 07/12/2022 15:14   DG Chest Port 1 View  Result Date: 07/12/2022 CLINICAL DATA:  Weakness.  Questionable sepsis.  Cognitive decline. EXAM: PORTABLE CHEST 1 VIEW COMPARISON:  04/30/2022. FINDINGS: Cardiac silhouette  normal in size and configuration. Enlarged superior mediastinum, most evident on the right, consistent with thyroid gland enlargement, stable from the prior exam. Remaining mediastinal and hilar contours unremarkable. Clear lungs.  No pleural effusion or pneumothorax. Skeletal structures are grossly intact. IMPRESSION: 1. No active disease. 2. Chronically enlarged thyroid gland. This was previously assessed with ultrasound on 12/19/2008, followed up with ultrasound on 10/15/2020. Electronically Signed   By: Lajean Manes M.D.   On: 07/12/2022 11:51    Pending Labs Unresulted Labs (From admission, onward)     Start     Ordered   07/12/22 1137  Remove and replace urinary cath (placed > 5 days) then obtain urine culture from new indwelling urinary catheter.  (Culture Panel)  Once,   URGENT       Question:  Indication  Answer:  Sepsis   07/12/22 1136   07/12/22 1115  Blood Culture (routine x 2)  (Undifferentiated presentation (screening labs and basic nursing orders))  BLOOD CULTURE X 2,   STAT      07/12/22 1115            Vitals/Pain Today's Vitals   07/12/22 1353 07/12/22 1400 07/12/22 1430 07/12/22 1605  BP:  116/72 123/69 127/70  Pulse:  (!) 119 (!) 118 96  Resp:  (!) 25 (!) 23 (!) 21  Temp: 100.3 F (37.9 C)     TempSrc: Rectal     SpO2:  98% 94% 97%  Weight:      Height:      PainSc:        Isolation Precautions No active isolations  Medications Medications  lactated ringers infusion ( Intravenous New Bag/Given 07/12/22 1605)  ceFEPIme (MAXIPIME) 1 g in sodium chloride 0.9 % 100 mL IVPB (0 g Intravenous Stopped 07/12/22 1600)  vancomycin variable dose per unstable renal function (pharmacist dosing) (has no administration in time range)  lactated ringers bolus 1,000 mL (0 mLs Intravenous Stopped 07/12/22 1600)  metroNIDAZOLE (FLAGYL) IVPB 500 mg (0 mg Intravenous Stopped 07/12/22 1600)  vancomycin (VANCOREADY) IVPB 1250 mg/250 mL (0 mg Intravenous Stopped 07/12/22 1600)     Mobility walks with device     Focused Assessments Renal Assessment Handoff: Output is being monitored via foley.   R Recommendations: See Admitting Provider Note  Report given to:   Additional Notes:

## 2022-07-12 NOTE — Assessment & Plan Note (Addendum)
Now resolved. - s/p Meropenem for 7 day course (2/8-2/14) for ESBL bacteremia (Bcx and Ucx positive for ESBL)

## 2022-07-12 NOTE — Assessment & Plan Note (Addendum)
Does not participate meaningfully in my exam this morning although it was quite early (5:30am). Multifactorial etiology 2/2 acute renal failure/uremia as well as baseline dementia with delirium. - Treatment of renal failure as above - Monitor mental status - Appreciate palliative involvement - Fall and delirium precautions - Consider discontinuing buspar. Unclear why patient on this at home. ?for anxiety or behavior disturbance

## 2022-07-13 DIAGNOSIS — A4151 Sepsis due to Escherichia coli [E. coli]: Secondary | ICD-10-CM | POA: Diagnosis present

## 2022-07-13 DIAGNOSIS — F05 Delirium due to known physiological condition: Secondary | ICD-10-CM | POA: Diagnosis present

## 2022-07-13 DIAGNOSIS — A419 Sepsis, unspecified organism: Secondary | ICD-10-CM | POA: Diagnosis not present

## 2022-07-13 DIAGNOSIS — D649 Anemia, unspecified: Secondary | ICD-10-CM | POA: Diagnosis present

## 2022-07-13 DIAGNOSIS — M79671 Pain in right foot: Secondary | ICD-10-CM | POA: Diagnosis not present

## 2022-07-13 DIAGNOSIS — B9620 Unspecified Escherichia coli [E. coli] as the cause of diseases classified elsewhere: Secondary | ICD-10-CM | POA: Diagnosis not present

## 2022-07-13 DIAGNOSIS — R531 Weakness: Secondary | ICD-10-CM | POA: Diagnosis present

## 2022-07-13 DIAGNOSIS — I5022 Chronic systolic (congestive) heart failure: Secondary | ICD-10-CM | POA: Diagnosis present

## 2022-07-13 DIAGNOSIS — R6520 Severe sepsis without septic shock: Secondary | ICD-10-CM | POA: Diagnosis not present

## 2022-07-13 DIAGNOSIS — N179 Acute kidney failure, unspecified: Secondary | ICD-10-CM | POA: Diagnosis present

## 2022-07-13 DIAGNOSIS — R64 Cachexia: Secondary | ICD-10-CM | POA: Diagnosis present

## 2022-07-13 DIAGNOSIS — E870 Hyperosmolality and hypernatremia: Secondary | ICD-10-CM | POA: Diagnosis not present

## 2022-07-13 DIAGNOSIS — Z7189 Other specified counseling: Secondary | ICD-10-CM | POA: Diagnosis not present

## 2022-07-13 DIAGNOSIS — N12 Tubulo-interstitial nephritis, not specified as acute or chronic: Secondary | ICD-10-CM | POA: Diagnosis present

## 2022-07-13 DIAGNOSIS — R7881 Bacteremia: Secondary | ICD-10-CM | POA: Diagnosis not present

## 2022-07-13 DIAGNOSIS — Y738 Miscellaneous gastroenterology and urology devices associated with adverse incidents, not elsewhere classified: Secondary | ICD-10-CM | POA: Diagnosis present

## 2022-07-13 DIAGNOSIS — Z515 Encounter for palliative care: Secondary | ICD-10-CM | POA: Diagnosis not present

## 2022-07-13 DIAGNOSIS — Z66 Do not resuscitate: Secondary | ICD-10-CM | POA: Diagnosis not present

## 2022-07-13 DIAGNOSIS — G9340 Encephalopathy, unspecified: Secondary | ICD-10-CM | POA: Diagnosis not present

## 2022-07-13 DIAGNOSIS — Y846 Urinary catheterization as the cause of abnormal reaction of the patient, or of later complication, without mention of misadventure at the time of the procedure: Secondary | ICD-10-CM | POA: Diagnosis present

## 2022-07-13 DIAGNOSIS — I130 Hypertensive heart and chronic kidney disease with heart failure and stage 1 through stage 4 chronic kidney disease, or unspecified chronic kidney disease: Secondary | ICD-10-CM | POA: Diagnosis present

## 2022-07-13 DIAGNOSIS — Z681 Body mass index (BMI) 19 or less, adult: Secondary | ICD-10-CM | POA: Diagnosis not present

## 2022-07-13 DIAGNOSIS — N1831 Chronic kidney disease, stage 3a: Secondary | ICD-10-CM | POA: Diagnosis present

## 2022-07-13 DIAGNOSIS — E43 Unspecified severe protein-calorie malnutrition: Secondary | ICD-10-CM | POA: Diagnosis present

## 2022-07-13 DIAGNOSIS — E8722 Chronic metabolic acidosis: Secondary | ICD-10-CM | POA: Diagnosis present

## 2022-07-13 DIAGNOSIS — G9341 Metabolic encephalopathy: Secondary | ICD-10-CM | POA: Diagnosis present

## 2022-07-13 DIAGNOSIS — E1122 Type 2 diabetes mellitus with diabetic chronic kidney disease: Secondary | ICD-10-CM | POA: Diagnosis present

## 2022-07-13 DIAGNOSIS — G9349 Other encephalopathy: Secondary | ICD-10-CM | POA: Diagnosis present

## 2022-07-13 DIAGNOSIS — N186 End stage renal disease: Secondary | ICD-10-CM | POA: Diagnosis not present

## 2022-07-13 DIAGNOSIS — Z1152 Encounter for screening for COVID-19: Secondary | ICD-10-CM | POA: Diagnosis not present

## 2022-07-13 DIAGNOSIS — E860 Dehydration: Secondary | ICD-10-CM | POA: Diagnosis present

## 2022-07-13 DIAGNOSIS — F0390 Unspecified dementia without behavioral disturbance: Secondary | ICD-10-CM | POA: Diagnosis present

## 2022-07-13 DIAGNOSIS — M79672 Pain in left foot: Secondary | ICD-10-CM | POA: Diagnosis not present

## 2022-07-13 DIAGNOSIS — T83511A Infection and inflammatory reaction due to indwelling urethral catheter, initial encounter: Secondary | ICD-10-CM | POA: Diagnosis present

## 2022-07-13 DIAGNOSIS — E0500 Thyrotoxicosis with diffuse goiter without thyrotoxic crisis or storm: Secondary | ICD-10-CM | POA: Diagnosis present

## 2022-07-13 LAB — CBC
HCT: 34.3 % — ABNORMAL LOW (ref 39.0–52.0)
Hemoglobin: 11 g/dL — ABNORMAL LOW (ref 13.0–17.0)
MCH: 28.5 pg (ref 26.0–34.0)
MCHC: 32.1 g/dL (ref 30.0–36.0)
MCV: 88.9 fL (ref 80.0–100.0)
Platelets: 234 10*3/uL (ref 150–400)
RBC: 3.86 MIL/uL — ABNORMAL LOW (ref 4.22–5.81)
RDW: 14.2 % (ref 11.5–15.5)
WBC: 14.9 10*3/uL — ABNORMAL HIGH (ref 4.0–10.5)
nRBC: 0 % (ref 0.0–0.2)

## 2022-07-13 LAB — COMPREHENSIVE METABOLIC PANEL
ALT: 88 U/L — ABNORMAL HIGH (ref 0–44)
AST: 122 U/L — ABNORMAL HIGH (ref 15–41)
Albumin: 1.9 g/dL — ABNORMAL LOW (ref 3.5–5.0)
Alkaline Phosphatase: 69 U/L (ref 38–126)
Anion gap: 14 (ref 5–15)
BUN: 141 mg/dL — ABNORMAL HIGH (ref 8–23)
CO2: 20 mmol/L — ABNORMAL LOW (ref 22–32)
Calcium: 9.2 mg/dL (ref 8.9–10.3)
Chloride: 112 mmol/L — ABNORMAL HIGH (ref 98–111)
Creatinine, Ser: 6.2 mg/dL — ABNORMAL HIGH (ref 0.61–1.24)
GFR, Estimated: 8 mL/min — ABNORMAL LOW (ref >60)
Glucose, Bld: 210 mg/dL — ABNORMAL HIGH (ref 70–99)
Potassium: 4.8 mmol/L (ref 3.5–5.1)
Sodium: 146 mmol/L — ABNORMAL HIGH (ref 135–145)
Total Bilirubin: 0.5 mg/dL (ref 0.3–1.2)
Total Protein: 6.9 g/dL (ref 6.5–8.1)

## 2022-07-13 LAB — GLUCOSE, CAPILLARY
Glucose-Capillary: 162 mg/dL — ABNORMAL HIGH (ref 70–99)
Glucose-Capillary: 173 mg/dL — ABNORMAL HIGH (ref 70–99)
Glucose-Capillary: 143 mg/dL — ABNORMAL HIGH (ref 70–99)
Glucose-Capillary: 232 mg/dL — ABNORMAL HIGH (ref 70–99)

## 2022-07-13 LAB — HEMOGLOBIN A1C
Hgb A1c MFr Bld: 5.5 % (ref 4.8–5.6)
Mean Plasma Glucose: 111.15 mg/dL

## 2022-07-13 LAB — MRSA NEXT GEN BY PCR, NASAL: MRSA by PCR Next Gen: DETECTED — AB

## 2022-07-13 MED ORDER — SODIUM CHLORIDE 0.45 % IV SOLN: INTRAVENOUS | Status: AC

## 2022-07-13 MED ORDER — CHLORHEXIDINE GLUCONATE CLOTH 2 % EX PADS
6.0000 | MEDICATED_PAD | Freq: Every day | CUTANEOUS | Status: DC
  Administered 2022-07-13 – 2022-07-22 (×8): 6 via TOPICAL

## 2022-07-13 MED ORDER — ACETAMINOPHEN 325 MG PO TABS
650.0000 mg | ORAL_TABLET | Freq: Four times a day (QID) | ORAL | Status: DC | PRN
  Administered 2022-07-13 – 2022-07-22 (×6): 650 mg via ORAL
  Filled 2022-07-13 (×8): qty 2

## 2022-07-13 NOTE — Progress Notes (Addendum)
Daily Progress Note Intern Pager: [phone]  Patient name: [name] Medical record number: [id number] Date of birth: August 28, 1940 Age: 82 y.o. Gender: male  Primary Care Provider: [name], DO Consultants: None Code Status: Full  Pt Overview and Major Events to Date:  2/5 Admitted  Assessment and Plan: HB is a 82 yo M admitted for sepsis, likely from pyelonephritis.   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   * Sepsis ([place]) Most likely urinary source, will tx pyelo (see below). Mentation improving. Cr still elevated. - Tx AKI as below - f/u Bcx (NGTD <24hr) and Ucx  AKI (acute kidney injury) ([place]) Cr remains stably elevated, may need further fluid resuscitation. Reassuringly, he is stool having good UOP (3.5L charted yesterday). - Restart LR 125 ml/hr for 12 hrs - Maintain foley - Consider Nephro consult if Cr remains unchanged or UOP worsens  Encephalopathy More awake this morning, seems less confused, and cheerful demeanor, seems to be at/close to baseline mentation.  - Fall and delirium precautions   Transaminitis Chronic. Stable on AM CMP.  Pyelonephritis HR improved w/ IVF, but still intermittently tachy. Overall, fluid status improving (MMM, skin turgor improved, and mentation improved). Low c/f MRSA, will d/c Vanc given drastic AKI.  - Cont Cefepime (2/5- ) - Stop Vanc (2/5) - Foley cath - Tylenol prn for fever  Chronic HFrEF (heart failure with reduced ejection fraction) ([place]) Echo 2022 showed EF 20-25%. Metop was decreased to 12.5 daily during 04/2022 admission due to bradycardia. Euvolemic on exam and on RA. - Cont home metop and jardiance - Hold Lasix given AKI  T2DM (type 2 diabetes mellitus) ([place]) Improving BG. A1c 5.5 (which may be too controlled for his age. Ensure he is not on insulin at discharge) - Cont  sSSI - Cont Jardiance   FEN/GI: dysphagia diet PPx: SQ Hep Dispo:Pending PT recommendations  pending clinical improvement . Barriers include need for IV antibiotics.   Subjective:  More alert this morning, cheerful and delightfully demented. NAD.   Objective: Temp:  [97.9 F (36.6 C)-100.3 F (37.9 C)] 97.9 F (36.6 C) (02/06 0800) Pulse Rate:  [78-125] 81 (02/06 1028) Resp:  [16-32] 16 (02/06 1028) BP: (101-134)/(60-123) 125/64 (02/06 0800) SpO2:  [92 %-100 %] 96 % (02/06 1028) Weight:  [60 kg] 60 kg (02/06 0500) Physical Exam: General: Alert, friendly and pleasant. Delightfully demented older man laying in bed. Neuro: Alert, oriented to self. More awake and making more sense today (compared to admission) HEENT: NCAT. MMM.  Cardiovascular: RRR.  Respiratory: CTAB. Normal WOB on RA. Abdomen: Suprapubic tenderness. Soft, nondistended.  Extremities: No pitting edema Derm: Skin turgor improved from yesterday  Laboratory: Most recent CBC Lab Results  Component Value Date   WBC 14.9 (H) 07/13/2022   HGB 11.0 (L) 07/13/2022   HCT 34.3 (L) 07/13/2022   MCV 88.9 07/13/2022   PLT 234 07/13/2022   Most recent BMP    Latest Ref Rng & Units 07/13/2022    5:17 AM  BMP  Glucose 70 - 99 mg/dL 210   BUN 8 - 23 mg/dL 141   Creatinine 0.61 - 1.24 mg/dL 6.20   Sodium 135 - 145 mmol/L 146   Potassium 3.5 - 5.1 mmol/L 4.8   Chloride 98 - 111 mmol/L 112   CO2 22 - 32 mmol/L 20   Calcium 8.9 - 10.3 mg/dL 9.2  Arlyce Dice, MD 07/13/2022, 12:02 PM  PGY-1, [place] Intern pager: [phone], text pages welcome Secure chat group [place]

## 2022-07-13 NOTE — Progress Notes (Signed)
Called and updated daughter, [name].

## 2022-07-13 NOTE — Evaluation (Signed)
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 Per chart initial vitals significant for tachycardia, hypotension, tachypnea, suggestive of sepsis. In additiona workup findings include massive AKI with EGFR of 8, chronic metabolic acidosis, leukocytosis, urinalysis consistent with UTI, chest x-ray without acute findings and CT abdomen pelvis shows possible left lower lobe pneumonia.    Assessment / Plan / Recommendation  Clinical  Impression  Pt demonstrated mild oral dysphagia exacerbated by reduced cognition. He was alert, followed commands with normal ROM and oral strength. He is endentulous without dentures present and pt states "I sometimes use them to eat but I can eat whatever I want." Pt has an intermittent audible swallow but no signs of aspiration with thin or applesauce. He frowned when swallowed but denied odonophagia or globus sensation. He made little to  no attempts to masticate cracker and swallowed a mostly intact piece of cracker with immediate cough. When given another piece in applesauce he was able to manipulate with delayed transit. Therapist will downgrade texture to puree (Dys 1), continue thin, crush meds and ST will continue to follow and feel he will be able to upgrade once mentation improves. SLP Visit Diagnosis: Dysphagia, unspecified (R13.10)    Aspiration Risk  Mild aspiration risk    Diet Recommendation Dysphagia 1 (Puree);Thin liquid   Liquid Administration via: Cup;Straw Medication Administration: Crushed with puree Supervision: Staff to assist with self feeding;Full supervision/cueing for compensatory strategies Compensations: Slow rate;Small sips/bites;Lingual sweep for clearance of pocketing Postural Changes: Seated upright at 90 degrees    Other  Recommendations Oral Care Recommendations: Oral care BID    Recommendations for follow up therapy are one component of a multi-disciplinary discharge planning process, led by the attending physician.  Recommendations may be updated based on patient status, additional functional criteria and insurance authorization.  Follow up Recommendations Skilled nursing-short term rehab (<3 hours/day)  Assistance Recommended at Discharge    Functional Status Assessment Patient has had a recent decline in their functional status and demonstrates the ability to make significant improvements in function in a reasonable and predictable amount of time.   Frequency and Duration min 2x/week  2 weeks       Prognosis Prognosis for Safe Diet Advancement: Good Barriers to Reach Goals: Cognitive deficits      Swallow Study   General Date of Onset: 07/12/22 HPI: [name] with a history of chronic indwelling Foley, goiter, tobacco use, HFrEF secondary to ischemic cardiomyopathy, hypertension, type 2 diabetes, dementia presenting with weakness and decreased urine output. Per chart initial vitals significant for tachycardia, hypotension, tachypnea, suggestive of sepsis. In additiona workup findings include massive AKI with EGFR of 8, chronic metabolic acidosis, leukocytosis, urinalysis consistent with UTI, chest x-ray without acute findings and CT abdomen pelvis shows possible left lower lobe pneumonia. Type of Study: Bedside Swallow Evaluation Previous Swallow Assessment:  (none) Diet Prior to this Study: Regular;Thin liquids Temperature Spikes Noted: No Respiratory Status: Room air History of Recent Intubation: No Behavior/Cognition: Alert;Cooperative;Pleasant mood;Confused;Requires cueing Oral Cavity Assessment: Dry Oral Care Completed by SLP: No Oral Cavity - Dentition: Edentulous (dentures not present and "I can chew anything without them") Vision: Functional for self-feeding Self-Feeding Abilities: Needs assist Patient Positioning: Upright in bed Baseline Vocal Quality: Normal Volitional Cough: Strong    Oral/Motor/Sensory Function Overall Oral Motor/Sensory Function: Within functional limits   Ice Chips Ice chips: Not tested   Thin Liquid Thin Liquid: Within functional limits Presentation: Straw    Nectar Thick Nectar Thick Liquid: Not tested   Honey Thick Honey Thick Liquid: Not tested   Puree Puree: Within functional limits   Solid     Solid: Impaired Oral Phase Functional Implications: Impaired mastication;Other (comment) (did not masticate and attempted to swallow mostly whole cracker) Pharyngeal Phase  Impairments: Cough - Immediate      [place] Siren 07/13/2022,9:07 AM

## 2022-07-14 DIAGNOSIS — A419 Sepsis, unspecified organism: Secondary | ICD-10-CM | POA: Diagnosis not present

## 2022-07-14 DIAGNOSIS — R6520 Severe sepsis without septic shock: Secondary | ICD-10-CM | POA: Diagnosis not present

## 2022-07-14 DIAGNOSIS — N179 Acute kidney failure, unspecified: Secondary | ICD-10-CM | POA: Diagnosis not present

## 2022-07-14 LAB — CBC
HCT: 36.6 % — ABNORMAL LOW (ref 39.0–52.0)
Hemoglobin: 11.4 g/dL — ABNORMAL LOW (ref 13.0–17.0)
MCH: 28.2 pg (ref 26.0–34.0)
MCHC: 31.1 g/dL (ref 30.0–36.0)
MCV: 90.6 fL (ref 80.0–100.0)
Platelets: 251 10*3/uL (ref 150–400)
RBC: 4.04 MIL/uL — ABNORMAL LOW (ref 4.22–5.81)
RDW: 14.4 % (ref 11.5–15.5)
WBC: 12.6 10*3/uL — ABNORMAL HIGH (ref 4.0–10.5)
nRBC: 0 % (ref 0.0–0.2)

## 2022-07-14 LAB — URINE CULTURE: Culture: 10000 — AB

## 2022-07-14 LAB — GLUCOSE, CAPILLARY
Glucose-Capillary: 188 mg/dL — ABNORMAL HIGH (ref 70–99)
Glucose-Capillary: 229 mg/dL — ABNORMAL HIGH (ref 70–99)
Glucose-Capillary: 192 mg/dL — ABNORMAL HIGH (ref 70–99)
Glucose-Capillary: 183 mg/dL — ABNORMAL HIGH (ref 70–99)

## 2022-07-14 LAB — COMPREHENSIVE METABOLIC PANEL
ALT: 78 U/L — ABNORMAL HIGH (ref 0–44)
AST: 89 U/L — ABNORMAL HIGH (ref 15–41)
Albumin: 1.8 g/dL — ABNORMAL LOW (ref 3.5–5.0)
Alkaline Phosphatase: 71 U/L (ref 38–126)
Anion gap: 15 (ref 5–15)
BUN: 143 mg/dL — ABNORMAL HIGH (ref 8–23)
CO2: 19 mmol/L — ABNORMAL LOW (ref 22–32)
Calcium: 9.2 mg/dL (ref 8.9–10.3)
Chloride: 114 mmol/L — ABNORMAL HIGH (ref 98–111)
Creatinine, Ser: 6.29 mg/dL — ABNORMAL HIGH (ref 0.61–1.24)
GFR, Estimated: 8 mL/min — ABNORMAL LOW (ref >60)
Glucose, Bld: 213 mg/dL — ABNORMAL HIGH (ref 70–99)
Potassium: 4.8 mmol/L (ref 3.5–5.1)
Sodium: 148 mmol/L — ABNORMAL HIGH (ref 135–145)
Total Bilirubin: 0.6 mg/dL (ref 0.3–1.2)
Total Protein: 6.8 g/dL (ref 6.5–8.1)

## 2022-07-14 MED ORDER — SODIUM CHLORIDE 0.9 % IV SOLN: INTRAVENOUS | Status: DC

## 2022-07-14 MED ORDER — FAMOTIDINE 20 MG PO TABS
20.0000 mg | ORAL_TABLET | Freq: Every day | ORAL | Status: DC
  Administered 2022-07-14 – 2022-07-24 (×11): 20 mg via ORAL
  Filled 2022-07-14 (×12): qty 1

## 2022-07-14 MED ORDER — SODIUM CHLORIDE 0.9 % IV BOLUS
500.0000 mL | Freq: Once | INTRAVENOUS | Status: AC
  Administered 2022-07-14: 500 mL via INTRAVENOUS

## 2022-07-14 MED ORDER — SODIUM CHLORIDE 0.9 % IV SOLN: INTRAVENOUS | Status: AC

## 2022-07-14 NOTE — Progress Notes (Signed)
Called patient's daughter and gave her update. Explained that patient is still on antibiotics for UTI. Though he had a resistant bacteria show up in his  culture explained that he is most likely colonized as per ID's evaluation and that we could stop antibiotics if patient remains stable tomorrow.   Patient's daughter asked why patient was sore all over. I mentioned that patient is dehydrated and also can have a toll taken on the body with infection. I let her know that I would order PT and OT eval and treat to help with reconditioning.   Lowry Ram, MD  PGY1 [place]

## 2022-07-14 NOTE — Progress Notes (Signed)
Curbsided Dr. Baxter Flattery (Infectious Disease) about Ucx results. Since it is only 10k colonies of ESBL on Ucx and Bcx neg x2days, this is more likely colonization rather than true UTI source. Plan to cont Cefepime and consider stopping tomorrow if pt is stable.

## 2022-07-14 NOTE — Plan of Care (Signed)
Pt is alert and stable. Pt tolerated all medications. Pt was calm and pleasant this shift. Call bell in reach.  Problem: Education: Goal: Knowledge of General Education information will improve Description: Including pain rating scale, medication(s)/side effects and non-pharmacologic comfort measures Outcome: Progressing   Problem: Health Behavior/Discharge Planning: Goal: Ability to manage health-related needs will improve Outcome: Progressing   Problem: Clinical Measurements: Goal: Ability to maintain clinical measurements within normal limits will improve Outcome: Progressing Goal: Will remain free from infection Outcome: Progressing Goal: Diagnostic test results will improve Outcome: Progressing Goal: Respiratory complications will improve Outcome: Progressing Goal: Cardiovascular complication will be avoided Outcome: Progressing

## 2022-07-14 NOTE — Progress Notes (Addendum)
     Daily Progress Note Intern Pager: [phone]  Patient name: [name] Medical record number: [id number] Date of birth: Jan 09, 1941 Age: 82 y.o. Gender: male  Primary Care Provider: [name], DO Consultants: Nephrology Code Status: Full  Pt Overview and Major Events to Date:  2/5 Admitted  Assessment and Plan: HB is a 82 yo M admitted for sepsis, likely from pyelonephritis.    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   AKI (acute kidney injury) ([place]) Cr still stably high today so Nephro consulted. Per Nephro, will continue IVF resuscitation for 24hr and reassess. ~1.8L UOP charted yesterday. - NS 181m/hr x24hr - Strict I/Os - Maintain foley - Nephro consulted, appreciate recs  Encephalopathy Likely multifactorial including metabolic derangement from sepsis, uremia from ARF, and underlying dementia.  - Fall and delirium precautions   Pyelonephritis Remains afebrile. Ucx grew 10k colonies of ESBL, Bcx NGTD for 2 days. Curbsided ID, felt that this is more likely contamination given low colony count and chronic foley. Recommended to continue current antibiotic course (do not need to switch to meropenem at this time) and consider d/cing antibiotics tomorrow.  - Cont Cefepime (2/5- ) - Foley cath - Tylenol prn for fever  Chronic HFrEF (heart failure with reduced ejection fraction) [place] EF 20-25%. - Cont home metop - Hold Lasix given AKI  T2[place] (type 2 diabetes mellitus) ([place] - Cont sSSI   FEN/GI: Dysphagia diet PPx: SQ hep Dispo:Pending PT recommendations  pending clinical improvement . Barriers include ongoing AKI and need for IVF.   Subjective:  NAEO. Per daughter, pt may be having heartburn discomfort and sore lips from dryness and cracking.  Objective: Temp:  [97.6 F (36.4 C)-98.8 F (37.1 C)] 98.6 F (37  C) (02/07 1143) Pulse Rate:  [71-95] 79 (02/07 1143) Resp:  [13-20] 20 (02/07 1143) BP: (113-124)/(57-65) 122/57 (02/07 1143) SpO2:  [93 %-100 %] 99 % (02/07 0751) Weight:  [60 kg] 60 kg (02/07 0500) Physical Exam: General: Friendly, pleasantly demented old man. NAD. Neuro: Alert and O to self and place, not time.  Cardiovascular: RRR. Respiratory: Normal WOB on RA. CTAB. Abdomen: Soft, nontender, nondistended. Extremities: No pitting edema in BL LE.  Laboratory: Most recent CBC Lab Results  Component Value Date   WBC 12.6 (H) 07/14/2022   HGB 11.4 (L) 07/14/2022   HCT 36.6 (L) 07/14/2022   MCV 90.6 07/14/2022   PLT 251 07/14/2022   Most recent BMP    Latest Ref Rng & Units 07/14/2022    6:40 AM  BMP  Glucose 70 - 99 mg/dL 213   BUN 8 - 23 mg/dL 143   Creatinine 0.61 - 1.24 mg/dL 6.29   Sodium 135 - 145 mmol/L 148   Potassium 3.5 - 5.1 mmol/L 4.8   Chloride 98 - 111 mmol/L 114   CO2 22 - 32 mmol/L 19   Calcium 8.9 - 10.3 mg/dL 9.2    ZArlyce Dice MD 07/14/2022, 2:00 PM  PGY-1, CPalisadesIntern pager: [phone] text pages welcome Secure chat group [place]

## 2022-07-14 NOTE — Consult Note (Signed)
[facility]  INPATIENT CONSULTATION  Reason for Consultation: AKI on CKD Requesting Provider: Dr. Ardelia Mems  HPI: [name] is an 82 y.o. male with HFrEF (EF 20-25%), DM, chronic indwelling foley, dementia who is seen for evaluation and management of AKI.  SNF resident sent in 2/5 for weakness and decreased UOP, initial assessment consistent with sepsis (T 100.3, leukocytosis, lactate 2.7) of urinary source, also with possible PNA on imaging.  CT a/p with inflamed appearing kidneys.  Concern for foley obstruction also so it was replaced.  Labs showing AKI on CKD - 04/2022 BL appeared ~1.3 now with BUN 140s, Cr 6s.  Since foley was replaced UOP was 3.2L on 2/5 and 1.9L yesterday.  Per I/Os he's net neg 1.3L for the admission.   BPs lowest 100s. Outpt meds include jardiance 10, I don't see any RAASi or NSAIDs.  He offers no history today when spoken to.   No family is present.  PMH: Past Medical History:  Diagnosis Date   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 ([place]) 10/08/2020   Allergic rhinitis    Anemia    ANEMIA-IRON DEFICIENCY 07/07/2009   Qualifier: Diagnosis of  By: Carlean Purl MD, Dimas Millin    Angio-edema    with ACEI and with ARB   Aortic root dilatation (HCC)    B12 deficiency    BPH (benign prostatic hyperplasia)    Nesi   Caregiver stress 10/10/2020   [name] (daughter) is sole and primary caregiver to Mr Demello.    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   Urticaria    PSH: Past Surgical History:  Procedure Laterality Date   ADENOIDECTOMY     CHOLECYSTECTOMY  1960   COLON SURGERY     right hemi-colectomy   COLONOSCOPY  multiple   ESOPHAGOGASTRODUODENOSCOPY     TONSILLECTOMY       Past Medical History:  Diagnosis Date   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 ([place]) 10/08/2020   Allergic rhinitis    Anemia    ANEMIA-IRON DEFICIENCY 07/07/2009   Qualifier: Diagnosis of  By: Carlean Purl MD, Dimas Millin    Angio-edema    with ACEI and with ARB   Aortic root dilatation (HCC)    B12 deficiency    BPH (benign prostatic hyperplasia)    Nesi   Caregiver stress 10/10/2020   [name] (daughter) is sole and primary caregiver to Mr Gabrielle.    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  Medications Prior to Admission  Medication Sig Dispense Refill   acetaminophen (TYLENOL) 325 MG tablet Take 650 mg by mouth every 4 (four) hours as needed (pain, fever 100F or above).     aspirin 81 MG chewable tablet Chew 1 tablet (81 mg total) by mouth daily. 30 tablet 0  atorvastatin (LIPITOR) 80 MG tablet Take 1 tablet (80 mg total) by mouth at bedtime. 90 tablet 3   busPIRone (BUSPAR) 5 MG tablet Take 5 mg by mouth 3 (three) times daily.     ciprofloxacin (CIPRO) 250 MG tablet Take 250 mg by mouth 2 (two) times daily. 7 day course (07/11/22-07/17/22)     empagliflozin (JARDIANCE) 10 MG TABS tablet Take 1 tablet (10 mg total) by mouth daily before breakfast. 90 tablet 3   furosemide (LASIX) 40 MG tablet Take 0.5 tablets (20 mg total) by mouth daily as needed. (Patient taking differently: Take 20 mg by mouth See admin instructions. 2 entries on MAR: 20 mg once daily in the morning (for 4 days, 07/11/22-07/15/22). May take an additional 20 mg during the day if needed (diuretic).) 45 tablet 3   guaiFENesin (GERI-TUSSIN) 100 MG/5ML liquid Take 200 mg by mouth  every 4 (four) hours as needed for cough.     ipratropium-albuterol (DUONEB) 0.5-2.5 (3) MG/3ML SOLN Take 3 mLs by nebulization every 4 (four) hours as needed (shortness of breath, wheezing).     metoprolol succinate (TOPROL-XL) 25 MG 24 hr tablet Take 0.5 tablets (12.5 mg total) by mouth daily.     Nutritional Supplements (NUTRITIONAL DRINK) LIQD Take 120 mLs by mouth 2 (two) times daily. Med Pass 2.0.     Probiotic Product (PROBIOTIC PO) Take 1 capsule by mouth 2 (two) times daily. While on antibiotics     cefTRIAXone 250 mg/mL in lidocaine (with preservative) injection Inject 1 g into the muscle once. (Patient not taking: Reported on 07/12/2022)     nicotine (NICODERM CQ - DOSED IN MG/24 HR) 7 mg/24hr patch Place 1 patch (7 mg total) onto the skin daily. (Patient not taking: Reported on 07/12/2022) 28 patch 0   sodium chloride 0.9 % infusion Inject 100 mL/hr into the vein See admin instructions. Sodium Chloride 0.9% IV - 100 ml/hr IV for 24 hours. (Patient not taking: Reported on 07/12/2022)      ALLERGIES:   Allergies  Allergen Reactions   Ace Inhibitors Swelling and Other (See Comments)    Site of swelling not known by the patient   Diovan [Valsartan] Swelling and Other (See Comments)    Site of swelling not known by the patient    FAM HX: Family History  Problem Relation Age of Onset   Diabetes Other        nephew   Colon cancer Neg Hx     Social History:   reports that he has been smoking cigarettes. He has a 30.00 pack-year smoking history. He has been exposed to tobacco smoke. His smokeless tobacco use includes chew. He reports that he does not drink alcohol and does not use drugs.  ROS: 12 system ROS unable to be obtained given patient factors (dementia)  Blood pressure 113/65, pulse 87, temperature 98.8 F (37.1 C), temperature source Axillary, resp. rate 18, height 6' (1.829 m), weight 60 kg, SpO2 99 %. PHYSICAL EXAM: Gen: cachectic frail  Eyes: anicteric ENT: MM  tacky Neck: no JVD CV:  RRR Abd: soft, thin, nontender Lungs: clear GU: foley in place - looks fine, draining somewhat cloudy urine Extr:  no edema Neuro: awake and alert, oriented to '49, self o/w not    Results for orders placed or performed during the hospital encounter of 07/12/22 (from the past 48 hour(s))  Lactic acid, plasma     Status: Abnormal   Collection Time: 07/12/22 11:06 AM  Result Value Ref  Range   Lactic Acid, Venous 2.7 (HH) 0.5 - 1.9 mmol/L    Comment: CRITICAL RESULT CALLED TO, READ BACK BY AND VERIFIED WITH Shirlee Latch, RN [phone] 07/12/22 L. KLAR Performed at [facility], [place] [address] [place], [place] [address]   Comprehensive metabolic panel     Status: Abnormal   Collection Time: 07/12/22 11:15 AM  Result Value Ref Range   Sodium 139 135 - 145 mmol/L   Potassium 5.0 3.5 - 5.1 mmol/L   Chloride 106 98 - 111 mmol/L   CO2 14 (L) 22 - 32 mmol/L   Glucose, Bld 410 (H) 70 - 99 mg/dL    Comment: Glucose reference range applies only to samples taken after fasting for at least 8 hours.   BUN 147 (H) 8 - 23 mg/dL   Creatinine, Ser 6.82 (H) 0.61 - 1.24 mg/dL   Calcium 8.9 8.9 - 10.3 mg/dL   Total Protein 6.6 6.5 - 8.1 g/dL   Albumin 2.1 (L) 3.5 - 5.0 g/dL   AST 151 (H) 15 - 41 U/L   ALT 89 (H) 0 - 44 U/L   Alkaline Phosphatase 74 38 - 126 U/L   Total Bilirubin 0.5 0.3 - 1.2 mg/dL   GFR, Estimated 8 (L) >60 mL/min    Comment: (NOTE) Calculated using the CKD-EPI Creatinine Equation (2021)    Anion gap 19 (H) 5 - 15    Comment: Performed at [facility], [place] [address] [place], [place] [address]  CBC with Differential     Status: Abnormal   Collection Time: 07/12/22 11:15 AM  Result Value Ref Range   WBC 14.3 (H) 4.0 - 10.5 K/uL   RBC 3.96 (L) 4.22 - 5.81 MIL/uL   Hemoglobin 11.5 (L) 13.0 - 17.0 g/dL   HCT 35.5 (L) 39.0 - 52.0 %   MCV 89.6 80.0 - 100.0 fL   MCH 29.0 26.0 - 34.0 pg   MCHC 32.4 30.0 - 36.0 g/dL   RDW 14.1 11.5 - 15.5 %    Platelets 257 150 - 400 K/uL   nRBC 0.0 0.0 - 0.2 %   Neutrophils Relative % 90 %   Neutro Abs 12.8 (H) 1.7 - 7.7 K/uL   Lymphocytes Relative 4 %   Lymphs Abs 0.6 (L) 0.7 - 4.0 K/uL   Monocytes Relative 6 %   Monocytes Absolute 0.9 0.1 - 1.0 K/uL   Eosinophils Relative 0 %   Eosinophils Absolute 0.0 0.0 - 0.5 K/uL   Basophils Relative 0 %   Basophils Absolute 0.0 0.0 - 0.1 K/uL   Immature Granulocytes 0 %   Abs Immature Granulocytes 0.06 0.00 - 0.07 K/uL    Comment: Performed at [facility], [place] [address] [place], [place] [address]  Protime-INR     Status: Abnormal   Collection Time: 07/12/22 11:15 AM  Result Value Ref Range   Prothrombin Time 16.0 (H) 11.4 - 15.2 seconds   INR 1.3 (H) 0.8 - 1.2    Comment: (NOTE) INR goal varies based on device and disease states. Performed at [facility], [place] [address] [place], [place] [address]   APTT     Status: None   Collection Time: 07/12/22 11:15 AM  Result Value Ref Range   aPTT 29 24 - 36 seconds    Comment: Performed at [place] [address] [place], [place] [address]  Urinalysis, Routine w reflex microscopic -Urine, Catheterized; Indwelling urinary catheter  Status: Abnormal   Collection Time: 07/12/22 11:15 AM  Result Value Ref Range   Color, Urine YELLOW YELLOW   APPearance HAZY (A) CLEAR   Specific Gravity, Urine 1.009 1.005 - 1.030   pH 7.0 5.0 - 8.0   Glucose, UA >=500 (A) NEGATIVE mg/dL   Hgb urine dipstick MODERATE (A) NEGATIVE   Bilirubin Urine NEGATIVE NEGATIVE   Ketones, ur NEGATIVE NEGATIVE mg/dL   Protein, ur 30 (A) NEGATIVE mg/dL   Nitrite NEGATIVE NEGATIVE   Leukocytes,Ua LARGE (A) NEGATIVE   RBC / HPF 21-50 0 - 5 RBC/hpf   WBC, UA >50 0 - 5 WBC/hpf   Bacteria, UA MANY (A) NONE SEEN   Squamous Epithelial / HPF 0-5 0 - 5 /HPF   WBC Clumps PRESENT     Comment: Performed at [facility], [address], [place], [place] [address]  Blood Culture (routine x 2)     Status:  None (Preliminary result)   Collection Time: 07/12/22 11:17 AM   Specimen: BLOOD RIGHT ARM  Result Value Ref Range   Specimen Description BLOOD RIGHT ARM    Special Requests      BOTTLES DRAWN AEROBIC AND ANAEROBIC Blood Culture adequate volume   Culture      NO GROWTH 2 DAYS Performed at [facility], [place] [address], [place], [place] [address]    Report Status PENDING   Remove and replace urinary cath (placed > 5 days) then obtain urine culture from new indwelling urinary catheter.     Status: Abnormal   Collection Time: 07/12/22 11:37 AM   Specimen: Urine, Catheterized  Result Value Ref Range   Specimen Description URINE, CATHETERIZED    Special Requests      NONE Performed at [place] [address] [place], [place] [address]    Culture (A)     10,000 COLONIES/mL ESCHERICHIA COLI Confirmed Extended Spectrum Beta-Lactamase Producer (ESBL).  In bloodstream infections from ESBL organisms, carbapenems are preferred over piperacillin/tazobactam. They are shown to have a lower risk of mortality.    Report Status 07/14/2022 FINAL    Organism ID, Bacteria ESCHERICHIA COLI (A)       Susceptibility   Escherichia coli - MIC*    AMPICILLIN >=32 RESISTANT Resistant     CEFAZOLIN >=64 RESISTANT Resistant     CEFEPIME 16 RESISTANT Resistant     CEFTRIAXONE >=64 RESISTANT Resistant     CIPROFLOXACIN <=0.25 SENSITIVE Sensitive     GENTAMICIN >=16 RESISTANT Resistant     IMIPENEM <=0.25 SENSITIVE Sensitive     NITROFURANTOIN 64 INTERMEDIATE Intermediate     TRIMETH/SULFA >=320 RESISTANT Resistant     AMPICILLIN/SULBACTAM 16 INTERMEDIATE Intermediate     PIP/TAZO <=4 SENSITIVE Sensitive     * 10,000 COLONIES/mL ESCHERICHIA COLI  Blood Culture (routine x 2)     Status: None (Preliminary result)   Collection Time: 07/12/22 11:48 AM   Specimen: BLOOD RIGHT HAND  Result Value Ref Range   Specimen Description BLOOD RIGHT HAND    Special Requests      BOTTLES DRAWN AEROBIC  AND ANAEROBIC Blood Culture adequate volume   Culture      NO GROWTH 2 DAYS Performed at [facility], [address], [place], [place] [address]    Report Status PENDING   Lactic acid, plasma     Status: None   Collection Time: 07/12/22  1:15 PM  Result Value Ref Range   Lactic Acid, Venous 1.7  0.5 - 1.9 mmol/L    Comment: Performed at [facility], [place] [address] [place], [place] [address]  Glucose, capillary     Status: Abnormal   Collection Time: 07/12/22  6:31 PM  Result Value Ref Range   Glucose-Capillary 179 (H) 70 - 99 mg/dL    Comment: Glucose reference range applies only to samples taken after fasting for at least 8 hours.  Resp panel by RT-PCR (RSV, Flu A&B, Covid) Anterior Nasal Swab     Status: None   Collection Time: 07/12/22  6:50 PM   Specimen: Anterior Nasal Swab  Result Value Ref Range   SARS Coronavirus 2 by RT PCR NEGATIVE NEGATIVE   Influenza A by PCR NEGATIVE NEGATIVE   Influenza B by PCR NEGATIVE NEGATIVE    Comment: (NOTE) The Xpert Xpress SARS-CoV-2/FLU/RSV plus assay is intended as an aid in the diagnosis of influenza from Nasopharyngeal swab specimens and should not be used as a sole basis for treatment. Nasal washings and aspirates are unacceptable for Xpert Xpress SARS-CoV-2/FLU/RSV testing.  Fact Sheet for Patients: EntrepreneurPulse.com.au  Fact Sheet for Healthcare Providers: IncredibleEmployment.be  This test is not yet approved or cleared by the [place] FDA and has been authorized for detection and/or diagnosis of SARS-CoV-2 by FDA under an Emergency Use Authorization (EUA). This EUA will remain in effect (meaning this test can be used) for the duration of the COVID-19 declaration under Section 564(b)(1) of the Act, 21 U.S.C. section 360bbb-3(b)(1), unless the authorization is terminated or revoked.     Resp Syncytial Virus by PCR NEGATIVE NEGATIVE    Comment: (NOTE) Fact Sheet for  Patients: EntrepreneurPulse.com.au  Fact Sheet for Healthcare Providers: IncredibleEmployment.be  This test is not yet approved or cleared by the [place] FDA and has been authorized for detection and/or diagnosis of SARS-CoV-2 by FDA under an Emergency Use Authorization (EUA). This EUA will remain in effect (meaning this test can be used) for the duration of the COVID-19 declaration under Section 564(b)(1) of the Act, 21 U.S.C. section 360bbb-3(b)(1), unless the authorization is terminated or revoked.  Performed at [facility], [place] [address] [place], [place] [address]   Basic metabolic panel     Status: Abnormal   Collection Time: 07/12/22  8:38 PM  Result Value Ref Range   Sodium 146 (H) 135 - 145 mmol/L   Potassium 4.6 3.5 - 5.1 mmol/L   Chloride 114 (H) 98 - 111 mmol/L   CO2 18 (L) 22 - 32 mmol/L   Glucose, Bld 209 (H) 70 - 99 mg/dL    Comment: Glucose reference range applies only to samples taken after fasting for at least 8 hours.   BUN 140 (H) 8 - 23 mg/dL   Creatinine, Ser 6.17 (H) 0.61 - 1.24 mg/dL   Calcium 9.3 8.9 - 10.3 mg/dL   GFR, Estimated 8 (L) >60 mL/min    Comment: (NOTE) Calculated using the CKD-EPI Creatinine Equation (2021)    Anion gap 14 5 - 15    Comment: Performed at [place] [address] [place], [place] [address]  Glucose, capillary     Status: Abnormal   Collection Time: 07/12/22 10:01 PM  Result Value Ref Range   Glucose-Capillary 192 (H) 70 - 99 mg/dL    Comment: Glucose reference range applies only to samples taken after fasting for at least 8 hours.  Comprehensive metabolic panel     Status: Abnormal   Collection Time: 07/13/22  5:17  AM  Result Value Ref Range   Sodium 146 (H) 135 - 145 mmol/L   Potassium 4.8 3.5 - 5.1 mmol/L   Chloride 112 (H) 98 - 111 mmol/L   CO2 20 (L) 22 - 32 mmol/L   Glucose, Bld 210 (H) 70 - 99 mg/dL    Comment: Glucose reference range applies only to  samples taken after fasting for at least 8 hours.   BUN 141 (H) 8 - 23 mg/dL   Creatinine, Ser 6.20 (H) 0.61 - 1.24 mg/dL   Calcium 9.2 8.9 - 10.3 mg/dL   Total Protein 6.9 6.5 - 8.1 g/dL   Albumin 1.9 (L) 3.5 - 5.0 g/dL   AST 122 (H) 15 - 41 U/L   ALT 88 (H) 0 - 44 U/L   Alkaline Phosphatase 69 38 - 126 U/L   Total Bilirubin 0.5 0.3 - 1.2 mg/dL   GFR, Estimated 8 (L) >60 mL/min    Comment: (NOTE) Calculated using the CKD-EPI Creatinine Equation (2021)    Anion gap 14 5 - 15    Comment: Performed at [facility], [place] [address], [place], [place] [address]  CBC     Status: Abnormal   Collection Time: 07/13/22  5:17 AM  Result Value Ref Range   WBC 14.9 (H) 4.0 - 10.5 K/uL   RBC 3.86 (L) 4.22 - 5.81 MIL/uL   Hemoglobin 11.0 (L) 13.0 - 17.0 g/dL   HCT 34.3 (L) 39.0 - 52.0 %   MCV 88.9 80.0 - 100.0 fL   MCH 28.5 26.0 - 34.0 pg   MCHC 32.1 30.0 - 36.0 g/dL   RDW 14.2 11.5 - 15.5 %   Platelets 234 150 - 400 K/uL   nRBC 0.0 0.0 - 0.2 %    Comment: Performed at [facility], [place] [address], [place], [place] [address]  Hemoglobin A1c     Status: None   Collection Time: 07/13/22  5:17 AM  Result Value Ref Range   Hgb A1c MFr Bld 5.5 4.8 - 5.6 %    Comment: RESULTS CONFIRMED BY MANUAL DILUTION (NOTE) Pre diabetes:          5.7%-6.4%  Diabetes:              >6.4%  Glycemic control for   <7.0% adults with diabetes CORRECTED ON 02/06 AT 5462: PREVIOUSLY REPORTED AS 7.4    Mean Plasma Glucose 111.15 mg/dL    Comment: Performed at [facility], [place] [address], [place], [place] [address] CORRECTED ON 02/06 AT 0938: PREVIOUSLY REPORTED AS 165.68   Glucose, capillary     Status: Abnormal   Collection Time: 07/13/22  8:02 AM  Result Value Ref Range   Glucose-Capillary 162 (H) 70 - 99 mg/dL    Comment: Glucose reference range applies only to samples taken after fasting for at least 8 hours.   Comment 1 Notify RN   MRSA Next Gen by PCR, Nasal     Status: Abnormal    Collection Time: 07/13/22 11:20 AM   Specimen: Nasal Mucosa; Nasal Swab  Result Value Ref Range   MRSA by PCR Next Gen DETECTED (A) NOT DETECTED    Comment: RESULT CALLED TO, READ BACK BY AND VERIFIED WITH: BKarlton Lemon [id number] '@[id number]'$  FH (NOTE) The GeneXpert MRSA Assay (FDA approved for NASAL specimens only), is one component of a comprehensive MRSA colonization surveillance program. It is not intended to diagnose MRSA infection nor to guide or monitor treatment for  MRSA infections. Test performance is not FDA approved in patients less than 88 years old. Performed at [facility], [place] [address], [place], [place] [address]   Glucose, capillary     Status: Abnormal   Collection Time: 07/13/22  1:00 PM  Result Value Ref Range   Glucose-Capillary 173 (H) 70 - 99 mg/dL    Comment: Glucose reference range applies only to samples taken after fasting for at least 8 hours.   Comment 1 Notify RN   Glucose, capillary     Status: Abnormal   Collection Time: 07/13/22  4:19 PM  Result Value Ref Range   Glucose-Capillary 143 (H) 70 - 99 mg/dL    Comment: Glucose reference range applies only to samples taken after fasting for at least 8 hours.  Glucose, capillary     Status: Abnormal   Collection Time: 07/13/22  9:20 PM  Result Value Ref Range   Glucose-Capillary 232 (H) 70 - 99 mg/dL    Comment: Glucose reference range applies only to samples taken after fasting for at least 8 hours.  Comprehensive metabolic panel     Status: Abnormal   Collection Time: 07/14/22  6:40 AM  Result Value Ref Range   Sodium 148 (H) 135 - 145 mmol/L   Potassium 4.8 3.5 - 5.1 mmol/L   Chloride 114 (H) 98 - 111 mmol/L   CO2 19 (L) 22 - 32 mmol/L   Glucose, Bld 213 (H) 70 - 99 mg/dL    Comment: Glucose reference range applies only to samples taken after fasting for at least 8 hours.   BUN 143 (H) 8 - 23 mg/dL   Creatinine, Ser 6.29 (H) 0.61 - 1.24 mg/dL   Calcium 9.2 8.9 - 10.3 mg/dL   Total Protein 6.8 6.5 -  8.1 g/dL   Albumin 1.8 (L) 3.5 - 5.0 g/dL   AST 89 (H) 15 - 41 U/L   ALT 78 (H) 0 - 44 U/L   Alkaline Phosphatase 71 38 - 126 U/L   Total Bilirubin 0.6 0.3 - 1.2 mg/dL   GFR, Estimated 8 (L) >60 mL/min    Comment: (NOTE) Calculated using the CKD-EPI Creatinine Equation (2021)    Anion gap 15 5 - 15    Comment: Performed at [facility], [place] [address] [place], [place] [address]  CBC     Status: Abnormal   Collection Time: 07/14/22  6:40 AM  Result Value Ref Range   WBC 12.6 (H) 4.0 - 10.5 K/uL   RBC 4.04 (L) 4.22 - 5.81 MIL/uL   Hemoglobin 11.4 (L) 13.0 - 17.0 g/dL   HCT 36.6 (L) 39.0 - 52.0 %   MCV 90.6 80.0 - 100.0 fL   MCH 28.2 26.0 - 34.0 pg   MCHC 31.1 30.0 - 36.0 g/dL   RDW 14.4 11.5 - 15.5 %   Platelets 251 150 - 400 K/uL   nRBC 0.0 0.0 - 0.2 %    Comment: Performed at [facility], [place] [address] [place] [address], [place], [place] [address]  Glucose, capillary     Status: Abnormal   Collection Time: 07/14/22  7:48 AM  Result Value Ref Range   Glucose-Capillary 188 (H) 70 - 99 mg/dL    Comment: Glucose reference range applies only to samples taken after fasting for at least 8 hours.    CT ABDOMEN PELVIS WO CONTRAST  Result Date: 07/12/2022 CLINICAL DATA:  Abdominal pain, acute, nonlocalized. EXAM: CT ABDOMEN AND PELVIS WITHOUT CONTRAST TECHNIQUE:  Multidetector CT imaging of the abdomen and pelvis was performed following the standard protocol without IV contrast. RADIATION DOSE REDUCTION: This exam was performed according to the departmental dose-optimization program which includes automated exposure control, adjustment of the mA and/or kV according to patient size and/or use of iterative reconstruction technique. COMPARISON:  12/25/2021 FINDINGS: Lower chest: Patchy infiltrate at the left lung base consistent with mild bronchopneumonia and volume loss. Hepatobiliary: Liver parenchyma appears normal without contrast. Pancreas: Normal Spleen: Normal Adrenals/Urinary Tract: Adrenal  glands are normal. Both kidneys show generalized swelling compared to the previous study suggesting acute nephritis. Bilateral pyelonephritis is not excluded. There is mild fullness of the renal collecting system on both sides. Multiple renal cysts are noted as seen previously, few showing slight hyperdensity. No specific follow-up recommended. There is a Foley catheter in the bladder. The bladder is thick walled as seen chronically. Stomach/Bowel: Stomach and small intestine are normal. No significant: Finding. Previous right hemicolectomy and reanastomosis. Vascular/Lymphatic: Aortic atherosclerosis. No aneurysm. IVC is normal. No significant adenopathy. Reproductive: Chronically prominent prostate gland. Other: No free fluid or air. Musculoskeletal: Ordinary chronic lumbar degenerative changes. IMPRESSION: 1. Patchy infiltrate at the left lung base consistent with mild bronchopneumonia and volume loss. 2. Generalized swelling of both kidneys suggesting acute nephritis. Bilateral infectious pyelonephritis is not excluded. Mild fullness of the renal collecting system on both sides. 3. Foley catheter in the bladder. Thick walled bladder as seen chronically. 4. Aortic atherosclerosis. 5. Chronically prominent prostate gland. 6. Previous right hemicolectomy and reanastomosis. Aortic Atherosclerosis (ICD10-I70.0). Electronically Signed   By: Nelson Chimes M.D.   On: 07/12/2022 15:14   DG Chest Port 1 View  Result Date: 07/12/2022 CLINICAL DATA:  Weakness.  Questionable sepsis.  Cognitive decline. EXAM: PORTABLE CHEST 1 VIEW COMPARISON:  04/30/2022. FINDINGS: Cardiac silhouette normal in size and configuration. Enlarged superior mediastinum, most evident on the right, consistent with thyroid gland enlargement, stable from the prior exam. Remaining mediastinal and hilar contours unremarkable. Clear lungs.  No pleural effusion or pneumothorax. Skeletal structures are grossly intact. IMPRESSION: 1. No active disease. 2.  Chronically enlarged thyroid gland. This was previously assessed with ultrasound on 12/19/2008, followed up with ultrasound on 10/15/2020. Electronically Signed   By: Lajean Manes M.D.   On: 07/12/2022 11:51    Assessment/Plan **Sepsis secondary to UTI: on presentation.  Appears to be improving on antibiotic therapy and foley s/p change out.   **AKI: severe, nonoliguric.  Suspect multifactorial with contributions of sepsis/hypoperfusion, pyelonephritis, hypovolemia, obstruction.  Looks like po intake is not robust and net negative for the admission during what appears to be a post obstructive diuresis.  Will give another fluid challenge today  - NS 0.9% then 125/hr x 24h -- stop if develops any e/o overload but in light of UOP don't expect will.   There are no emergent needs for dialysis at this time, though I expect some of his AMS is uremic in nature.  He's a poor candidate for dialysis due to frailty and comorbidities and hopefully he will turn around with supportive care.  Cont to hold SGLT2i.  **HFrEF:   2022 EF 20-25%, judicious use of fluids.  Med mgmt limited by GFR currently.   **hypernatremia: mild, in setting of post obstructive solute diuresis.  Encourage po intake of water.  If worsening would need to give free water via IV.  **metabolic acidosis:  Bicarb 19, follow.  **Anemia:  Hb 11s, follow.  **Dementia: SNF resident - need to get info from  family how far off his baseline he is currently; certainly with a BUN 140s could be contributing to AMS.  Will follow - reach out with concerns.   Justin Mend 07/14/2022, 9:25 AM

## 2022-07-14 NOTE — Progress Notes (Signed)
Spoke with Dr. Johnney Ou with nephrology about consult on this patient for significant AKI and recommendations for fluids in this patient with HFrEF, pyelonephritis and chronic indwelling foley. She will see patient.

## 2022-07-15 DIAGNOSIS — Z515 Encounter for palliative care: Secondary | ICD-10-CM

## 2022-07-15 DIAGNOSIS — A419 Sepsis, unspecified organism: Secondary | ICD-10-CM | POA: Diagnosis not present

## 2022-07-15 DIAGNOSIS — Z7189 Other specified counseling: Secondary | ICD-10-CM

## 2022-07-15 DIAGNOSIS — R6520 Severe sepsis without septic shock: Secondary | ICD-10-CM | POA: Diagnosis not present

## 2022-07-15 DIAGNOSIS — N179 Acute kidney failure, unspecified: Secondary | ICD-10-CM | POA: Diagnosis not present

## 2022-07-15 LAB — BLOOD CULTURE ID PANEL (REFLEXED) - BCID2

## 2022-07-15 LAB — GLUCOSE, CAPILLARY
Glucose-Capillary: 152 mg/dL — ABNORMAL HIGH (ref 70–99)
Glucose-Capillary: 184 mg/dL — ABNORMAL HIGH (ref 70–99)
Glucose-Capillary: 308 mg/dL — ABNORMAL HIGH (ref 70–99)

## 2022-07-15 LAB — COMPREHENSIVE METABOLIC PANEL
ALT: 61 U/L — ABNORMAL HIGH (ref 0–44)
AST: 64 U/L — ABNORMAL HIGH (ref 15–41)
Albumin: 1.6 g/dL — ABNORMAL LOW (ref 3.5–5.0)
Alkaline Phosphatase: 60 U/L (ref 38–126)
Anion gap: 11 (ref 5–15)
BUN: 142 mg/dL — ABNORMAL HIGH (ref 8–23)
CO2: 17 mmol/L — ABNORMAL LOW (ref 22–32)
Calcium: 8.8 mg/dL — ABNORMAL LOW (ref 8.9–10.3)
Chloride: 118 mmol/L — ABNORMAL HIGH (ref 98–111)
Creatinine, Ser: 6.28 mg/dL — ABNORMAL HIGH (ref 0.61–1.24)
GFR, Estimated: 8 mL/min — ABNORMAL LOW (ref >60)
Glucose, Bld: 202 mg/dL — ABNORMAL HIGH (ref 70–99)
Potassium: 4.6 mmol/L (ref 3.5–5.1)
Sodium: 146 mmol/L — ABNORMAL HIGH (ref 135–145)
Total Bilirubin: 0.8 mg/dL (ref 0.3–1.2)
Total Protein: 6.7 g/dL (ref 6.5–8.1)

## 2022-07-15 LAB — CBC
HCT: 33.6 % — ABNORMAL LOW (ref 39.0–52.0)
Hemoglobin: 10.5 g/dL — ABNORMAL LOW (ref 13.0–17.0)
MCH: 28.2 pg (ref 26.0–34.0)
MCHC: 31.3 g/dL (ref 30.0–36.0)
MCV: 90.3 fL (ref 80.0–100.0)
Platelets: 249 10*3/uL (ref 150–400)
RBC: 3.72 MIL/uL — ABNORMAL LOW (ref 4.22–5.81)
RDW: 14.6 % (ref 11.5–15.5)
WBC: 12.6 10*3/uL — ABNORMAL HIGH (ref 4.0–10.5)
nRBC: 0 % (ref 0.0–0.2)

## 2022-07-15 LAB — PHOSPHORUS: Phosphorus: 6.2 mg/dL — ABNORMAL HIGH (ref 2.5–4.6)

## 2022-07-15 LAB — MAGNESIUM: Magnesium: 2.6 mg/dL — ABNORMAL HIGH (ref 1.7–2.4)

## 2022-07-15 MED ORDER — SODIUM CHLORIDE 0.9 % IV SOLN
1.0000 g | INTRAVENOUS | Status: DC
  Administered 2022-07-15 – 2022-07-19 (×5): 1 g via INTRAVENOUS
  Filled 2022-07-15 (×5): qty 20

## 2022-07-15 NOTE — Evaluation (Signed)
Physical Therapy Evaluation Patient Details Name: Desmon Hitchner MRN: [id number] DOB: 03/07/1941 Today's Date: 07/15/2022  History of Present Illness  82 y.o. male admitted 07/12/22 from [facility] SNF with decreased urine output, AKI, UTI and pyelonephritis. PMHx; dementia, hearing impairment, HFrEF 20%, ICM, HTN, HLD, T2DM, goiter, hyperthyroidism, gout, chronic foley, tobacco use.  Clinical Impression  Pt pleasant, confused and convinced he is 82 yo. Per chart pt was walking with RW last admission before admission to SNF. Pt able to stand but not successfully step or progress functional mobility this date with mod-max assist to transition OOB to chair. Pt with HR 88-97 and SPO2 99-100% on RA. Pt with decreased cognition, balance, transfers and mobility who will benefit from trial of acute therapy to maximize mobility and function to decrease burden of care.        Recommendations for follow up therapy are one component of a multi-disciplinary discharge planning process, led by the attending physician.  Recommendations may be updated based on patient status, additional functional criteria and insurance authorization.  Follow Up Recommendations Long-term institutional care without follow-up therapy Can patient physically be transported by private vehicle: No    Assistance Recommended at Discharge Frequent or constant Supervision/Assistance  Patient can return home with the following  A lot of help with walking and/or transfers;A lot of help with bathing/dressing/bathroom;Assistance with cooking/housework;Direct supervision/assist for medications management;Assist for transportation;Assistance with feeding;Direct supervision/assist for financial management    Equipment Recommendations None recommended by PT  Recommendations for Other Services       Functional Status Assessment Patient has had a recent decline in their functional status and/or demonstrates limited ability to make significant  improvements in function in a reasonable and predictable amount of time     Precautions / Restrictions Precautions Precautions: Fall;Other (comment) Precaution Comments: Sub Q IV line supra pubic      Mobility  Bed Mobility Overal bed mobility: Needs Assistance Bed Mobility: Supine to Sit     Supine to sit: Min assist     General bed mobility comments: physical assist to initiate moving legs toward EOB, with min assist to lift trunk from surface. Min assist for sitting balance once trunk elevated due to posterior lean    Transfers Overall transfer level: Needs assistance   Transfers: Sit to/from Stand, Bed to chair/wheelchair/BSC Sit to Stand: Min assist, Mod assist Stand pivot transfers: Max assist         General transfer comment: initial stand from bed min assist with RW present. Pt able to rise but maintained pushing RW too anterior and flexed trunk. Pt unable to step and sat back down. Repeated standing trial mod assist with max assist to pivot bed to chair. Mod assist to scoot in chair    Ambulation/Gait               General Gait Details: unable  Stairs            Wheelchair Mobility    Modified Rankin (Stroke Patients Only)       Balance Overall balance assessment: Needs assistance Sitting-balance support: Feet supported Sitting balance-Leahy Scale: Poor Sitting balance - Comments: min assist with progression to minguard sitting balance   Standing balance support: Bilateral upper extremity supported, Reliant on assistive device for balance Standing balance-Leahy Scale: Poor Standing balance comment: RW in standing with assist to maintain standing  Pertinent Vitals/Pain Pain Assessment Pain Assessment: No/denies pain    Home Living Family/patient expects to be discharged to:: Skilled nursing facility                        Prior Function Prior Level of Function : Patient poor  historian/Family not available             Mobility Comments: pt was walking with RW last admission, family not present to confirm mobility since at SNF       Hand Dominance        Extremity/Trunk Assessment   Upper Extremity Assessment Upper Extremity Assessment: Generalized weakness    Lower Extremity Assessment Lower Extremity Assessment: Generalized weakness    Cervical / Trunk Assessment Cervical / Trunk Assessment: Kyphotic  Communication   Communication: HOH  Cognition Arousal/Alertness: Awake/alert Behavior During Therapy: Flat affect Overall Cognitive Status: Difficult to assess                                 General Comments: pt able to state his name but continues to state he is 82yo despite education for actual age, unable to follow single commands consistently and with delay        General Comments      Exercises     Assessment/Plan    PT Assessment Patient needs continued PT services  PT Problem List Decreased strength;Decreased mobility;Decreased safety awareness;Decreased activity tolerance;Decreased cognition;Decreased balance;Decreased knowledge of use of DME;Decreased coordination       PT Treatment Interventions DME instruction;Therapeutic activities;Gait training;Therapeutic exercise;Patient/family education;Functional mobility training;Balance training;Cognitive remediation    PT Goals (Current goals can be found in the Care Plan section)  Acute Rehab PT Goals PT Goal Formulation: Patient unable to participate in goal setting Time For Goal Achievement: 07/29/22 Potential to Achieve Goals: Fair    Frequency Min 2X/week     Co-evaluation               AM-PAC PT "6 Clicks" Mobility  Outcome Measure Help needed turning from your back to your side while in a flat bed without using bedrails?: A Little Help needed moving from lying on your back to sitting on the side of a flat bed without using bedrails?: A  Lot Help needed moving to and from a bed to a chair (including a wheelchair)?: A Lot Help needed standing up from a chair using your arms (e.g., wheelchair or bedside chair)?: A Lot Help needed to walk in hospital room?: Total Help needed climbing 3-5 steps with a railing? : Total 6 Click Score: 11    End of Session Equipment Utilized During Treatment: Gait belt Activity Tolerance: Patient tolerated treatment well Patient left: in chair;with call bell/phone within reach;with chair alarm set Nurse Communication: Mobility status PT Visit Diagnosis: Other abnormalities of gait and mobility (R26.89);Muscle weakness (generalized) (M62.81)    Time: 7673-4193 PT Time Calculation (min) (ACUTE ONLY): 25 min   Charges:   PT Evaluation $PT Eval Moderate Complexity: 1 Mod PT Treatments $Therapeutic Activity: 8-22 mins        Bayard Males, PT Acute Rehabilitation Services Office: [phone]   Sandy Salaam Ruthella Kirchman 07/15/2022, 1:05 PM

## 2022-07-15 NOTE — Progress Notes (Signed)
Notified that pt Bcx grew ESBL, c/w Ucx results. Pt is already switched to meropenem so no changes to therapy at this time. Will likely need to restart antibiotic course since pt was not properly covered previously (per culture sensitivities).  - Cont Meropenem (2/8- )

## 2022-07-15 NOTE — Progress Notes (Signed)
PHARMACY - PHYSICIAN COMMUNICATION CRITICAL VALUE ALERT - BLOOD CULTURE IDENTIFICATION (BCID)  [name] is an 82 y.o. male who presented to [facility] on 07/12/2022 with a chief complaint of sepsis  Assessment:  1 of 4 blood cultures with ecoli, ESBL  Name of physician (or Provider) ContactedMarkus Jarvis, MD  Current antibiotics: Meropenem  Changes to prescribed antibiotics recommended:  Patient is on recommended antibiotics - No changes needed  Results for orders placed or performed during the hospital encounter of 07/12/22  Blood Culture ID Panel (Reflexed) (Collected: 07/12/2022 11:48 AM)  Result Value Ref Range   Enterococcus faecalis NOT DETECTED NOT DETECTED   Enterococcus Faecium NOT DETECTED NOT DETECTED   Listeria monocytogenes NOT DETECTED NOT DETECTED   Staphylococcus species NOT DETECTED NOT DETECTED   Staphylococcus aureus (BCID) NOT DETECTED NOT DETECTED   Staphylococcus epidermidis NOT DETECTED NOT DETECTED   Staphylococcus lugdunensis NOT DETECTED NOT DETECTED   Streptococcus species NOT DETECTED NOT DETECTED   Streptococcus agalactiae NOT DETECTED NOT DETECTED   Streptococcus pneumoniae NOT DETECTED NOT DETECTED   Streptococcus pyogenes NOT DETECTED NOT DETECTED   A.calcoaceticus-baumannii NOT DETECTED NOT DETECTED   Bacteroides fragilis NOT DETECTED NOT DETECTED   Enterobacterales DETECTED (A) NOT DETECTED   Enterobacter cloacae complex NOT DETECTED NOT DETECTED   Escherichia coli DETECTED (A) NOT DETECTED   Klebsiella aerogenes NOT DETECTED NOT DETECTED   Klebsiella oxytoca NOT DETECTED NOT DETECTED   Klebsiella pneumoniae NOT DETECTED NOT DETECTED   Proteus species NOT DETECTED NOT DETECTED   Salmonella species NOT DETECTED NOT DETECTED   Serratia marcescens NOT DETECTED NOT DETECTED   Haemophilus influenzae NOT DETECTED NOT DETECTED   Neisseria meningitidis NOT DETECTED NOT DETECTED   Pseudomonas aeruginosa NOT DETECTED NOT DETECTED   Stenotrophomonas  maltophilia NOT DETECTED NOT DETECTED   Candida albicans NOT DETECTED NOT DETECTED   Candida auris NOT DETECTED NOT DETECTED   Candida glabrata NOT DETECTED NOT DETECTED   Candida krusei NOT DETECTED NOT DETECTED   Candida parapsilosis NOT DETECTED NOT DETECTED   Candida tropicalis NOT DETECTED NOT DETECTED   Cryptococcus neoformans/gattii NOT DETECTED NOT DETECTED   CTX-M ESBL DETECTED (A) NOT DETECTED   Carbapenem resistance IMP NOT DETECTED NOT DETECTED   Carbapenem resistance KPC NOT DETECTED NOT DETECTED   Carbapenem resistance NDM NOT DETECTED NOT DETECTED   Carbapenem resist OXA 48 LIKE NOT DETECTED NOT DETECTED   Carbapenem resistance VIM NOT DETECTED NOT DETECTED    Uvaldo Rising, BCPS, BCCP Clinical Pharmacist  07/15/2022 4:25 PM   [facility] pharmacy phone numbers are listed on [email or web address]

## 2022-07-15 NOTE — Progress Notes (Signed)
Speech Language Pathology Treatment: Dysphagia  Patient Details Name: [name] MRN: [id number] DOB: [birth year] Today's Date: 07/15/2022 Time: 6144-3154 SLP Time Calculation (min) (ACUTE ONLY): 12 min  Assessment / Plan / Recommendation Clinical Impression  Pt seen for possible upgrade from puree. He continues to have cognitive deficits marked by decreased attention and distractibility needing cues to focus on chewing, not talking. Decreased mandibular motion for chewing in addition to decreased attention led to prolonged mastication and need for liquid wash to clear. He took sequential straw sips thin with one delayed cough over course of session. Puree texture continues to be appropriate for this pt and for the duration of this hospital stay. It is recommended he continue puree, thin, meds crushed and full supervision/assist with meals. ST will discharge pt and recommend once he returns to his skilled facility that he be assessed by ST for upgrade in texture once his mentation improves.   HPI HPI: [name] with a history of chronic indwelling Foley, goiter, tobacco use, HFrEF secondary to ischemic cardiomyopathy, hypertension, type 2 diabetes, dementia presenting with weakness and decreased urine output. Per chart initial vitals significant for tachycardia, hypotension, tachypnea, suggestive of sepsis. In additiona workup findings include massive AKI with EGFR of 8, chronic metabolic acidosis, leukocytosis, urinalysis consistent with UTI, chest x-ray without acute findings and CT abdomen pelvis shows possible left lower lobe pneumonia.      SLP Plan  All goals met;Discharge SLP treatment due to (comment);Other (Comment) (diet can be upgraded when back at facility)      Recommendations for follow up therapy are one component of a multi-disciplinary discharge planning process, led by the attending physician.  Recommendations may be updated based on patient status, additional  functional criteria and insurance authorization.    Recommendations  Diet recommendations: Dysphagia 1 (puree);Thin liquid Liquids provided via: Straw;Cup Medication Administration: Crushed with puree Supervision: Staff to assist with self feeding;Full supervision/cueing for compensatory strategies Compensations: Slow rate;Small sips/bites;Lingual sweep for clearance of pocketing Postural Changes and/or Swallow Maneuvers: Seated upright 90 degrees                Oral Care Recommendations: Oral care BID Follow Up Recommendations: Skilled nursing-short term rehab (<3 hours/day) Assistance recommended at discharge: Frequent or constant Supervision/Assistance SLP Visit Diagnosis: Dysphagia, unspecified (R13.10) Plan: All goals met;Discharge SLP treatment due to (comment);Other (Comment) (diet can be upgraded when back at facility)           Houston Siren  07/15/2022, 9:52 AM

## 2022-07-15 NOTE — Progress Notes (Signed)
     Daily Progress Note Intern Pager: [phone]  Patient name: [name] Medical record number: [id number] Date of birth: 1940-08-11 Age: 82 y.o. Gender: male  Primary Care Provider: [name], DO Consultants: Nephrology, Palliative Code Status: Full  Pt Overview and Major Events to Date:  2/5 Admitted  Assessment and Plan: HB is a 82 yo M admitted for sepsis, likely from pyelonephritis, and ARF.   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   AKI (acute kidney injury) ([place]) Cr remains elevated despite fluid resus. Has 1650 UOP this morning. Per Nephro, he is a poor dialysis candidate but can trial short-term (1 wk) dialysis to see if additional time improves kidney function. At the same time, daughter and son has been made aware that he is poor dialysis candidate and may need palliative involvement. Discussed this w/ daughter Chauncey Reading) and she is agreeable.  - Stop IVF - NPO @ [facility] for potential HD cath and HD tomorrow - Nephrology consulted, appreciate recs - Indwelling foley - Strict I/O's  Encephalopathy Slightly more confused this morning, but more alert later in afternoon.  - Fall and delirium precautions   Pyelonephritis Remains afebrile. Ucx grew 10k ESBL, and technically not covered by current regimen (Cefepime). Spoke w/ Dr Baxter Flattery (ID), she notes that pt's white count has remained elevated during this admission despite antibiotics. She recommends switching to meropenem (susceptible per Ucx). - Start Meropenem 1g q8h for ESBL pyelo (2/8- ) - Stop Cefepime (2/5-2/7) - Tylenol prn for fever  Chronic HFrEF (heart failure with reduced ejection fraction) ([place]) Echo 2022 showed EF 20-25%. - Cont home metop - Hold Lasix given AKI  T2DM (type 2 diabetes mellitus) ([place]) - Cont sSSI   FEN/GI: Dysphagia PPx: SQ Hep Dispo:Pending PT  recommendations  pending clinical improvement . Barriers include need for IV antibiotics.   Subjective:  NAEO.   Objective: Temp:  [97.7 F (36.5 C)-98.1 F (36.7 C)] 97.7 F (36.5 C) (02/08 0800) Pulse Rate:  [60-101] 101 (02/08 0800) Resp:  [12-22] 19 (02/08 0800) BP: (107-127)/(57-70) 118/70 (02/08 0800) SpO2:  [90 %-98 %] 95 % (02/08 0800) Weight:  [60.7 kg] 60.7 kg (02/08 0442) Physical Exam: General: Pleasantly demented older man, laying in bed. NAD. Cardiovascular: RRR Respiratory: CTAB. Normal WOB on RA. Abdomen: Nontender, nondistended, soft. Normal BS.  Laboratory: Most recent CBC Lab Results  Component Value Date   WBC 12.6 (H) 07/15/2022   HGB 10.5 (L) 07/15/2022   HCT 33.6 (L) 07/15/2022   MCV 90.3 07/15/2022   PLT 249 07/15/2022   Most recent BMP    Latest Ref Rng & Units 07/15/2022    2:55 AM  BMP  Glucose 70 - 99 mg/dL 202   BUN 8 - 23 mg/dL 142   Creatinine 0.61 - 1.24 mg/dL 6.28   Sodium 135 - 145 mmol/L 146   Potassium 3.5 - 5.1 mmol/L 4.6   Chloride 98 - 111 mmol/L 118   CO2 22 - 32 mmol/L 17   Calcium 8.9 - 10.3 mg/dL 8.8    Arlyce Dice, MD 07/15/2022, 2:01 PM  PGY-1, [place] Intern pager: [phone], text pages welcome Secure chat group [place]

## 2022-07-15 NOTE — Plan of Care (Signed)
Pt alert and stable. Pt took al medications and tolerated well. Pt is more alert and active this shift. Pt rested well. Call bell in reach. Problem: Education: Goal: Knowledge of General Education information will improve Description: Including pain rating scale, medication(s)/side effects and non-pharmacologic comfort measures Outcome: Progressing   Problem: Health Behavior/Discharge Planning: Goal: Ability to manage health-related needs will improve Outcome: Progressing   Problem: Clinical Measurements: Goal: Will remain free from infection Outcome: Progressing   Problem: Clinical Measurements: Goal: Diagnostic test results will improve Outcome: Progressing

## 2022-07-15 NOTE — Consult Note (Signed)
Palliative Medicine Inpatient Consult Note  Consulting Provider: Dr. Markus Jarvis  Reason for consult:   [place] Palliative Medicine Consult  Reason for Consult? Dementia patient with acute renal failure but not a good dialysis candidate.   Daughter Diplomatic Services operational officer) is HCPOA. Her cell is [phone]   07/15/2022  HPI:  Per intake H&P --> 82 y.o. male admitted 07/12/22 from [facility] SNF with decreased urine output, AKI, UTI and pyelonephritis. PMHx; dementia, hearing impairment, HFrEF 20%, ICM, HTN, HLD, T2DM, goiter, hyperthyroidism, gout, chronic foley, tobacco use.   Palliative care has been asked to get involved in the setting of progressive kidney failure to further address goals of care.   Clinical Assessment/Goals of Care:  *Please note that this is a verbal dictation therefore any spelling or grammatical errors are due to the "[place]" system interpretation.  I have reviewed medical records including EPIC notes, labs and imaging, received report from bedside RN, assessed the patient who is resting in the chair and denies distress at the time of assessment.    I called patients daughter, [name] to further discuss diagnosis prognosis, GOC, EOL wishes, disposition and options.   I introduced Palliative Medicine as specialized medical care for people living with serious illness. It focuses on providing relief from the symptoms and stress of a serious illness. The goal is to improve quality of life for both the patient and the family.  Medical History Review and Understanding:  I reviewed with patients daughter that Elion has a medical history of diabetes, HTN, HLD, and BPH.  Social History:  Arnold is from [place] originally. He has lived the majority of his life in the [place] area. He is divorced. He had four children though one has passed away and one is not terribly involved. His son Brayln Duque, and daughter, [name] are quite  involved in his life. 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 He is a [place] man and practices within the [facility] denomination.   Functional and Nutritional State:  Prior to admission, Rodricus has been living at [facility]. He formerly had an apartment though due to his dementia was wondering and "getting lost" therefore his family determined he needed full time care. He has recently (two weeks or so) had a fairly poor appetite.   Advance Directives:  A detailed discussion was had today regarding advanced directives.  Patients daughter does endorse that she has a copy of [employer] and will email them to the Palliative team to scan into the EMR.  Code Status:  Concepts specific to code status, artifical feeding and hydration, continued IV antibiotics and rehospitalization was had.  The difference between a aggressive medical intervention path  and a palliative comfort care path for this patient at this time was had.   For the time being, [name] would like to keep her father as a full code. She shares that she realizes if he discharges with hospice that he his code status will need to change but for the time being she remains hopeful for improvements.  Discussion:  [name] was able to discuss with me her fathers declining health state since November of this last year. She expresses that he had been living in an apartment though had multiple episodes whereby he wandered and got lost. She expresses that preceding this last year he was in decent health and roughly four years ago was  fully independent.   [name] shares that presently she is under the impression that her father is very ill secondary to sepsis. She expresses that the plan is for a trial of hemodialysis to see if patients mental and kidney function improves. If for some reason he does not improve, [name] has been told that [place]  is not a long term dialysis candidate and that she would need to enroll in hospice services.   I described hospice as a service for patients who have a life expectancy of 6 months or less.  The goal of hospice is the preservation of dignity and quality at the end phases of life.  Under hospice care, the focus changes from curative to symptom relief.   I spent time offering support as patients daughter expresses that this information was a shock to her. Provided support through therapeutic listening.   Plan to allow time for outcomes at this point in time.   Discussed the importance of continued conversation with family and their  medical providers regarding overall plan of care and treatment options, ensuring decisions are within the context of the patients values and GOCs.  Decision Maker: [name] (Daughter): [phone] (Mobile)   SUMMARY OF RECOMMENDATIONS   FULL CODE --> Ongoing conversation patients daughter, She is aware that if patient eventually needs hospice then he would need to be a DNR. For now patients daughter believes he would want all efforts made to live. Left a Hard Choices booklet at bedside  Plan to allow time for outcomes  Will obtain a copy of AD's for EMR  Patients daughter is aware of the likely possibility of hospice if no improvement after a one week trial of dialysis  Ongoing support  Code Status/Advance Care Planning: FULL CODE  Palliative Prophylaxis:  Aspiration, Bowel Regimen, Delirium Protocol, Frequent Pain Assessment, Oral Care, Palliative Wound Care, and Turn Reposition  Additional Recommendations (Limitations, Scope, Preferences): Continue current care  Psycho-social/Spiritual:  Desire for further Chaplaincy support: Yes, Christian Additional Recommendations: Education on kidney failure   Prognosis: Quite poor overall.   Discharge Planning: Discharge plan is not certain at this time  Vitals:   07/15/22 0745 07/15/22 0800  BP:   118/70  Pulse:  (!) 101  Resp:  19  Temp:  97.7 F (36.5 C)  SpO2: 95% 95%    Intake/Output Summary (Last 24 hours) at 07/15/2022 1312 Last data filed at 07/15/2022 1157 Gross per 24 hour  Intake 1650 ml  Output 600 ml  Net 1050 ml   Last Weight  Most recent update: 07/15/2022  4:42 AM    Weight  60.7 kg (133 lb 13.1 oz)            Gen: Frail elderly AA M in NAD HEENT: moist mucous membranes CV: Irregular rate and regular rhythm  PULM:  On RA, breathing is even and nonlabored ABD: soft/nontender  EXT: No edema  Neuro: Pleasantly confused  PPS: 30%   This conversation/these recommendations were discussed with patient primary care team, Dr. Markus Jarvis  Billing based on MDM: High  Problems Addressed: One acute or chronic illness or injury that poses a threat to life or bodily function  Amount and/or Complexity of Data: Category 3:Discussion of management or test interpretation with external physician/other qualified health care professional/appropriate source (not separately reported)  Risks: Decision regarding hospitalization or escalation of hospital care and Decision not to resuscitate or to de-escalate care because of poor prognosis ______________________________________________________ [facility] Team Team  Cell Phone: [phone] Please utilize secure chat with additional questions, if there is no response within 30 minutes please call the above phone number  Palliative Medicine Team providers are available by phone from 7am to 7pm daily and can be reached through the team cell phone.  Should this patient require assistance outside of these hours, please call the patient's attending physician.

## 2022-07-15 NOTE — Evaluation (Signed)
Occupational Therapy Evaluation and Discharge Patient Details Name: [name] MRN: [id number] DOB: 07-03-1940 Today's Date: 07/15/2022   History of Present Illness 82 y.o. male admitted 07/12/22 from [facility] SNF with decreased urine output, AKI, UTI and pyelonephritis. PMHx; dementia, hearing impairment, HFrEF 20%, ICM, HTN, HLD, T2DM, goiter, hyperthyroidism, gout, chronic foley, tobacco use.   Clinical Impression   Per SNF, pt is assisted for mobility, bathing, dressing and toileting. He can self feed. Pt presents with impaired cognition, per RN daughter states he is not at his baseline. Pt with generalized weakness requiring max assist to stand. Did not demonstrate ability to step and declined use of RW, stating, " That isn't mine."  Pt set up for lunch seated in chair and self feeding at end of session. No acute OT needs identified. Pt to return to SNF, in which he is a resident, upon discharge.    Recommendations for follow up therapy are one component of a multi-disciplinary discharge planning process, led by the attending physician.  Recommendations may be updated based on patient status, additional functional criteria and insurance authorization.   Follow Up Recommendations  Skilled nursing-short term rehab (<3 hours/day)     Assistance Recommended at Discharge Frequent or constant Supervision/Assistance  Patient can return home with the following Two people to help with walking and/or transfers;A lot of help with bathing/dressing/bathroom;Assistance with cooking/housework;Assistance with feeding;Direct supervision/assist for medications management;Direct supervision/assist for financial management;Assist for transportation;Help with stairs or ramp for entrance    Functional Status Assessment  Patient has had a recent decline in their functional status and/or demonstrates limited ability to make significant improvements in function in a reasonable and predictable amount of time   Equipment Recommendations  None recommended by OT    Recommendations for Other Services       Precautions / Restrictions Precautions Precautions: Fall;Other (comment) Precaution Comments: Sub Q IV line supra pubic      Mobility Bed Mobility               General bed mobility comments: in chair, declined back to bed    Transfers Overall transfer level: Needs assistance Equipment used: 1 person hand held assist Transfers: Sit to/from Stand Sit to Stand: Mod assist Stand pivot transfers: Max assist         General transfer comment: pt declining use of walker "that's not mine," stood with heavy posterior lean with max assist from recliner      Balance Overall balance assessment: Needs assistance   Sitting balance-Leahy Scale: Fair Sitting balance - Comments: at edge of chair   Standing balance support: Bilateral upper extremity supported Standing balance-Leahy Scale: Poor Standing balance comment: mod to maintain                           ADL either performed or assessed with clinical judgement   ADL Overall ADL's : Needs assistance/impaired Eating/Feeding: Set up;Sitting Eating/Feeding Details (indicate cue type and reason): assist to set up meal tray, self fed with spoon Grooming: Wash/dry hands;Sitting;Set up Grooming Details (indicate cue type and reason): washed face and hands with physical assist to initiate                               General ADL Comments: pt is dependent in bathing, dressing and toileting at SNF     Vision Ability to See in Adequate Light: 0 Adequate Additional  Comments: likely perceptual deficits     Perception     Praxis      Pertinent Vitals/Pain Pain Assessment Pain Assessment: No/denies pain     Hand Dominance Right   Extremity/Trunk Assessment Upper Extremity Assessment Upper Extremity Assessment: Overall WFL for tasks assessed   Lower Extremity Assessment Lower Extremity Assessment:  Defer to PT evaluation   Cervical / Trunk Assessment Cervical / Trunk Assessment: Kyphotic   Communication Communication Communication: HOH   Cognition Arousal/Alertness: Awake/alert Behavior During Therapy: Flat affect Overall Cognitive Status: Impaired/Different from baseline Area of Impairment: Orientation, Attention, Memory, Following commands, Safety/judgement, Awareness, Problem solving                 Orientation Level: Disoriented to, Place, Time, Situation Current Attention Level: Sustained Memory: Decreased short-term memory Following Commands: Follows one step commands inconsistently, Follows one step commands with increased time Safety/Judgement: Decreased awareness of safety, Decreased awareness of deficits Awareness: Intellectual Problem Solving: Slow processing, Decreased initiation, Difficulty sequencing General Comments: following commands in context of activity with maximum multimodal cues     General Comments       Exercises     Shoulder Instructions      Home Living Family/patient expects to be discharged to:: Skilled nursing facility                                        Prior Functioning/Environment Prior Level of Function : Patient poor historian/Family not available             Mobility Comments: pt was walking with RW last admission, family not present to confirm mobility since at SNF ADLs Comments: pt self feeds, assisted for bathing, dressing and toileting        OT Problem List: Decreased strength;Impaired balance (sitting and/or standing);Decreased knowledge of use of DME or AE;Decreased cognition      OT Treatment/Interventions:      OT Goals(Current goals can be found in the care plan section)    OT Frequency:      Co-evaluation              AM-PAC OT "6 Clicks" Daily Activity     Outcome Measure Help from another person eating meals?: A Little Help from another person taking care of personal  grooming?: A Little Help from another person toileting, which includes using toliet, bedpan, or urinal?: Total Help from another person bathing (including washing, rinsing, drying)?: Total Help from another person to put on and taking off regular upper body clothing?: Total Help from another person to put on and taking off regular lower body clothing?: Total 6 Click Score: 10   End of Session Equipment Utilized During Treatment: Gait belt Nurse Communication: Other (comment) (pt self feeding, confused)  Activity Tolerance: Patient tolerated treatment well Patient left: in chair;with call bell/phone within reach;with chair alarm set  OT Visit Diagnosis: Unsteadiness on feet (R26.81);Muscle weakness (generalized) (M62.81);Other symptoms and signs involving cognitive function                Time: 1346-1410 OT Time Calculation (min): 24 min Charges:  OT General Charges $OT Visit: 1 Visit OT Evaluation $OT Eval Moderate Complexity: 1 Mod OT Treatments $Self Care/Home Management : 8-22 mins  Cleta Alberts, OTR/L Acute Rehabilitation Services Office: [phone]  Malka So 07/15/2022, 2:22 PM

## 2022-07-15 NOTE — Progress Notes (Signed)
[facility] Progress Note   Subjective:   Pt awake and alert but no significant history provided. UOP 450 yesterday.   Daughter bedside and says still not back to baseline.  C/o achiness but seems more frail and confused than usual.   Objective Vitals:   07/15/22 0301 07/15/22 0442 07/15/22 0745 07/15/22 0800  BP: 127/62   118/70  Pulse: 60   (!) 101  Resp: 12   19  Temp: 98 F (36.7 C)   97.7 F (36.5 C)  TempSrc: Oral   Oral  SpO2:   95% 95%  Weight:  60.7 kg    Height:       Physical Exam Gen: cachectic frail  Eyes: anicteric ENT: MM tacky Neck: no JVD CV:  RRR Abd: soft, thin, nontender Lungs: clear GU: foley in place - looks fine, draining somewhat cloudy urine Extr:  no edema Neuro: awake and alert, oriented to '49, self o/w not   Additional Objective Labs: Basic Metabolic Panel: Recent Labs  Lab 07/13/22 0517 07/14/22 0640 07/15/22 0255  NA 146* 148* 146*  K 4.8 4.8 4.6  CL 112* 114* 118*  CO2 20* 19* 17*  GLUCOSE 210* 213* 202*  BUN 141* 143* 142*  CREATININE 6.20* 6.29* 6.28*  CALCIUM 9.2 9.2 8.8*  PHOS  --   --  6.2*   Liver Function Tests: Recent Labs  Lab 07/13/22 0517 07/14/22 0640 07/15/22 0255  AST 122* 89* 64*  ALT 88* 78* 61*  ALKPHOS 69 71 60  BILITOT 0.5 0.6 0.8  PROT 6.9 6.8 6.7  ALBUMIN 1.9* 1.8* 1.6*   No results for input(s): "LIPASE", "AMYLASE" in the last 168 hours. CBC: Recent Labs  Lab 07/12/22 1115 07/13/22 0517 07/14/22 0640 07/15/22 0255  WBC 14.3* 14.9* 12.6* 12.6*  NEUTROABS 12.8*  --   --   --   HGB 11.5* 11.0* 11.4* 10.5*  HCT 35.5* 34.3* 36.6* 33.6*  MCV 89.6 88.9 90.6 90.3  PLT 257 234 251 249   Blood Culture    Component Value Date/Time   SDES BLOOD RIGHT HAND 07/12/2022 1148   SPECREQUEST  07/12/2022 1148    BOTTLES DRAWN AEROBIC AND ANAEROBIC Blood Culture adequate volume   CULT  07/12/2022 1148    NO GROWTH 3 DAYS Performed at [facility] [address]  [place], [place] [address]    REPTSTATUS PENDING 07/12/2022 1148    Cardiac Enzymes: No results for input(s): "CKTOTAL", "CKMB", "CKMBINDEX", "TROPONINI" in the last 168 hours. CBG: Recent Labs  Lab 07/14/22 0748 07/14/22 1148 07/14/22 1633 07/14/22 2057 07/15/22 0819  GLUCAP 188* 229* 192* 183* 152*   Iron Studies: No results for input(s): "IRON", "TIBC", "TRANSFERRIN", "FERRITIN" in the last 72 hours. '@lablastinr3'$ @ Studies/Results: No results found. Medications:  sodium chloride 125 mL/hr at 07/14/22 1330   ceFEPime (MAXIPIME) IV 1 g (07/14/22 1408)    atorvastatin  80 mg Oral QHS   busPIRone  5 mg Oral TID   Chlorhexidine Gluconate Cloth  6 each Topical Daily   famotidine  20 mg Oral Daily   heparin  5,000 Units Subcutaneous Q8H   insulin aspart  0-9 Units Subcutaneous TID WC   metoprolol succinate  12.5 mg Oral Daily   Assessment/Plan **Sepsis secondary to UTI: on presentation.  Appears to be improving on antibiotic therapy and foley s/p change out.    **AKI on CKD 3:  Baseline kidney function as of 04/2022 in the 1.'3mg'$ /dL range.  Now with severe,  nonoliguric AKI.  Suspect multifactorial with contributions of sepsis/hypoperfusion, pyelonephritis, hypovolemia, obstruction.  In setting of poor po intake gave fluid challenge 2/7 but unfortunately no improvement in kidney function.    There are no emergent needs for dialysis at this time, though I expect some of his AMS is uremic in nature.  He's a poor candidate for dialysis due to frailty and comorbidities and hopefully he will turn around with supportive care.  I had a long talk with daughter Chauncey Reading -- if labs not improving tomorrow can offer a time limited trial of dialysis (1 week) to see if with additional time kidneys recover.  If not I would recommend hospice - give comorbids and frailty not a long term HD candidate.  She called brother and they are agreeable.   NPO pMN tonight and if labs not improved tomorrow temp HD catheter  and HD.   Cont to hold SGLT2i.   **HFrEF:   2022 EF 20-25%, judicious use of fluids.  Med mgmt limited by GFR currently.    **hypernatremia: mild, in setting of post obstructive solute diuresis.  Encourage po intake of water.  Improving.   **metabolic acidosis:  Bicarb 17, started oral bicarb.   **Anemia:  Hb 11s, follow.   **Dementia: SNF resident - seems to be fair way from usual baseline according to daughter; certainly with a BUN 140s could be contributing to AMS. Dialysis as above.   Will follow - reach out with concerns.     Jannifer Hick MD 07/15/2022, 9:26 AM  [facility] Pager: [phone]

## 2022-07-15 NOTE — Progress Notes (Signed)
Patient agitated refusing blood sugar check and temperature being taken. WCTM

## 2022-07-16 ENCOUNTER — Inpatient Hospital Stay (HOSPITAL_COMMUNITY): Payer: Medicare PPO

## 2022-07-16 DIAGNOSIS — N12 Tubulo-interstitial nephritis, not specified as acute or chronic: Secondary | ICD-10-CM | POA: Insufficient documentation

## 2022-07-16 DIAGNOSIS — R6520 Severe sepsis without septic shock: Secondary | ICD-10-CM | POA: Diagnosis not present

## 2022-07-16 DIAGNOSIS — G9340 Encephalopathy, unspecified: Secondary | ICD-10-CM | POA: Diagnosis not present

## 2022-07-16 DIAGNOSIS — F17210 Nicotine dependence, cigarettes, uncomplicated: Secondary | ICD-10-CM

## 2022-07-16 DIAGNOSIS — I5022 Chronic systolic (congestive) heart failure: Secondary | ICD-10-CM

## 2022-07-16 DIAGNOSIS — A4151 Sepsis due to Escherichia coli [E. coli]: Secondary | ICD-10-CM

## 2022-07-16 DIAGNOSIS — N179 Acute kidney failure, unspecified: Secondary | ICD-10-CM | POA: Diagnosis not present

## 2022-07-16 HISTORY — PX: IR FLUORO GUIDE CV LINE RIGHT: IMG2283

## 2022-07-16 HISTORY — PX: IR US GUIDE VASC ACCESS RIGHT: IMG2390

## 2022-07-16 LAB — RENAL FUNCTION PANEL
Albumin: 1.6 g/dL — ABNORMAL LOW (ref 3.5–5.0)
Anion gap: 16 — ABNORMAL HIGH (ref 5–15)
BUN: 146 mg/dL — ABNORMAL HIGH (ref 8–23)
CO2: 17 mmol/L — ABNORMAL LOW (ref 22–32)
Calcium: 9.2 mg/dL (ref 8.9–10.3)
Chloride: 111 mmol/L (ref 98–111)
Creatinine, Ser: 6.23 mg/dL — ABNORMAL HIGH (ref 0.61–1.24)
GFR, Estimated: 8 mL/min — ABNORMAL LOW (ref >60)
Glucose, Bld: 156 mg/dL — ABNORMAL HIGH (ref 70–99)
Phosphorus: 5.8 mg/dL — ABNORMAL HIGH (ref 2.5–4.6)
Potassium: 5 mmol/L (ref 3.5–5.1)
Sodium: 144 mmol/L (ref 135–145)

## 2022-07-16 LAB — CBC
HCT: 33.5 % — ABNORMAL LOW (ref 39.0–52.0)
Hemoglobin: 11.1 g/dL — ABNORMAL LOW (ref 13.0–17.0)
MCH: 28.6 pg (ref 26.0–34.0)
MCHC: 33.1 g/dL (ref 30.0–36.0)
MCV: 86.3 fL (ref 80.0–100.0)
Platelets: 274 10*3/uL (ref 150–400)
RBC: 3.88 MIL/uL — ABNORMAL LOW (ref 4.22–5.81)
RDW: 14.6 % (ref 11.5–15.5)
WBC: 11.9 10*3/uL — ABNORMAL HIGH (ref 4.0–10.5)
nRBC: 0 % (ref 0.0–0.2)

## 2022-07-16 LAB — HEPATITIS B SURFACE ANTIGEN: Hepatitis B Surface Ag: NONREACTIVE

## 2022-07-16 LAB — GLUCOSE, CAPILLARY
Glucose-Capillary: 169 mg/dL — ABNORMAL HIGH (ref 70–99)
Glucose-Capillary: 167 mg/dL — ABNORMAL HIGH (ref 70–99)
Glucose-Capillary: 163 mg/dL — ABNORMAL HIGH (ref 70–99)

## 2022-07-16 LAB — MAGNESIUM: Magnesium: 2.6 mg/dL — ABNORMAL HIGH (ref 1.7–2.4)

## 2022-07-16 MED ORDER — LIDOCAINE HCL 1 % IJ SOLN
INTRAMUSCULAR | Status: AC
  Administered 2022-07-16: 5 mL via INTRADERMAL
  Filled 2022-07-16: qty 20

## 2022-07-16 MED ORDER — HEPARIN SODIUM (PORCINE) 1000 UNIT/ML IJ SOLN
INTRAMUSCULAR | Status: AC
  Administered 2022-07-16: 2600 [IU]
  Filled 2022-07-16: qty 10

## 2022-07-16 MED ORDER — CHLORHEXIDINE GLUCONATE CLOTH 2 % EX PADS
6.0000 | MEDICATED_PAD | Freq: Every day | CUTANEOUS | Status: DC
  Administered 2022-07-17 – 2022-07-24 (×8): 6 via TOPICAL

## 2022-07-16 NOTE — Progress Notes (Addendum)
FMTS Interim Progress Note  S:Checked on patient after right IJ placed. Does not complain of any pain right now.  Paged by RN that daughter believes that patient is more altered than his normal.  Went to bedside with Dr. Gwendolyn Lima and discussed with daughter.  She says that he has been in and out of being oriented and dis oriented throughout his hospital stay but moreso in the past 2 days.  She has noticed that he has been more sleepy than his normal.  O: BP 126/79 (BP Location: Left Arm)   Pulse 74   Temp 98 F (36.7 C) (Axillary)   Resp 16   Ht 6' (1.829 m)   Wt 60.7 kg   SpO2 98%   BMI 18.15 kg/m   General: NAD, awake; responds to voice and touch.-not able to answer orientation questions-mumbling Skin: Right IJ site bandace CDI, nontender to palpation, no hematoma Neuro: pupils equal and reactive, moves all extremities no focal weakness  A/P: AMS  Ddx: Most likely either metabolic related to uremia or bloodstream infection or psychiatric related to acute hospital delirium on top of baseline dementia Alcohol-no Acidosis-unlikely, patient is breathing comfortably on room air Arrhythmia/Cardiac-rhythm strip on telemetry NSR Endocrine-last glucose 163 Electrolytes-possible but stable on BMP this morning Overdose/Opiates/Sedating meds-none Trauma-unlikely no preceding event Temperature-normal Thiamine (Wernicke's)-denies alcohol use Insulin-SSI with meals only Stroke-unlikely, moving all extremities, orientation comes and goes more consistent with delirium Seizure-unlikely, no unusual movements noted -HD session today or tomorrow per nephrology -Continue meropenem for bloodstream infection -Monitor mental status after HD -Delirium precautions -Frequent re-orientation  Gerrit Heck, MD 07/16/2022, 4:36 PM PGY-2, [facility] pager [phone]

## 2022-07-16 NOTE — Consult Note (Signed)
Marland Kitchen    [facility]    Date of Admission:  07/12/2022     Total days of antibiotics                Reason for Consult: E. Coli Bacteremia   Referring Provider: Dr. Markus Jarvis Primary Care Provider: [name], DO   ASSESSMENT:  [name] is an 82 y/o male with previous history of urinary retention and chronic foley catheter presenting for decreased urinary output and found to have acute kidney injury complicated by ESBL E. Coli bacteremia. Unfortunately failed fluid challenge and Nephrology attempting limited 1 week trial of dialysis to see if renal function can recover as he is not a candidate for long term dialysis. No other clear source of infection on CT and suspect urinary source for E. Coli. Has had 2 days of Meropenem and would tentatively treat for total of 7 days pending any additional findings. Will await blood culture sensitivities. Remaining medical and supportive care per Primary Team.  PLAN:  Continue current dose of meropenem. Acute kidney injury with trial of dialysis per Nephrology.  Remaining medical and supportive care per primary team.    Principal Problem:   Bacteremia due to Escherichia coli Active Problems:   T2DM (type 2 diabetes mellitus) (HCC)   Goals of care, counseling/discussion   Chronic HFrEF (heart failure with reduced ejection fraction) (HCC)   AKI (acute kidney injury) ([place])   Encephalopathy   Transaminitis   Sepsis with acute renal failure without septic shock (HCC)   Pyelonephritis    atorvastatin  80 mg Oral QHS   busPIRone  5 mg Oral TID   Chlorhexidine Gluconate Cloth  6 each Topical Daily   [START ON 07/17/2022] Chlorhexidine Gluconate Cloth  6 each Topical Q0600   famotidine  20 mg Oral Daily   heparin  5,000 Units Subcutaneous Q8H   insulin aspart  0-9 Units Subcutaneous TID WC   metoprolol succinate  12.5 mg Oral Daily     HPI: [name] is a 82 y.o. male with previous medical history of dementia,  hearing impairment, diabetes, hypertension, thyroid disorder, and BPH sent to ED from [facility] for decreased urine output and concern for kidney failure.   [name] is a resident at [facility] facility where he has an indwelling foley catheter that has not been draining since the day prior to arrival. Previously admitted in November for urinary retention and AKI. Foley catheter was removed and replaced in the ED. Elevated temperature of 100.3 F with tachycardia and tachypnea. Acute renal failure with creatinine of 6.82 (baseline 1.1-1.4). WBC count of 14,000. Chest x-ray with no active disease. CT abdomen/pelvis with generalized swelling of both kidneys suggesting acute nephritis with bilateral infectious pyelonephritis not excluded. Nephrology consulted and did not improve with fluid challenge on 2/7. Attempting a trial of HD for 1 week although not a long term dialysis candidate.   [name] has been afebrile since initial elevated temperature. Blood cultures are positive through [facility] for ESBL E. Coli with culture sensitivities pending. Urine culture is also positive for ESBL E. Coli. Currently on Day 5 of antibiotic therapy having received cefepime for 3 days, and 1 dose of vancomycin. Currently on Day 2 of meropenem.    [name] is confused during interview and not able to contribute additional information above that was gleaned from chart review.  This is reportedly not his baseline mental status.  Review of Systems: Review of  Systems  Unable to perform ROS: Dementia     Past Medical History:  Diagnosis Date   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 ([employer]) 10/08/2020   Allergic rhinitis    Anemia    ANEMIA-IRON DEFICIENCY 07/07/2009   Qualifier: Diagnosis of  By: Carlean Purl MD, Dimas Millin    Angio-edema    with ACEI and with ARB   Aortic root dilatation (HCC)    B12 deficiency    BPH (benign prostatic hyperplasia)    Nesi    Caregiver stress 10/10/2020   [name] (daughter) is sole and primary caregiver to Mr Tienken.    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   Urticaria     Social History   Tobacco Use   Smoking status: Every Day    Packs/day: 1.00    Years: 30.00    Total pack years: 30.00    Types: Cigarettes    Passive exposure: Current   Smokeless tobacco: Current    Types: Chew    Last attempt to quit: 06/07/2001  Vaping Use   Vaping Use: Never used  Substance Use Topics   Alcohol use: No    Alcohol/week: 0.0 standard drinks of alcohol    Comment: quit about 10 years ago    Drug use: No    Family History  Problem Relation Age of Onset   Diabetes Other        nephew   Colon cancer Neg Hx     Allergies  Allergen Reactions   Ace Inhibitors Swelling and Other (See Comments)    Site of swelling not known by the patient   Diovan [Valsartan] Swelling and Other (See Comments)    Site of swelling not known by the patient    OBJECTIVE: Blood pressure (!) 138/54, pulse 74, temperature 98 F (36.7 C), temperature source Axillary, resp. rate 16, height 6' (1.829 m), weight 60.7 kg, SpO2 98 %.  Physical Exam Constitutional:      General: He is not in acute distress.    Appearance: He is well-developed.     Comments: Lying in bed; confused.   Cardiovascular:     Rate and Rhythm: Normal rate and regular rhythm.     Heart sounds: Normal heart sounds.  Pulmonary:     Effort: Pulmonary effort is normal.     Breath sounds: Normal breath sounds.  Skin:    General: Skin is warm and dry.  Neurological:     Mental Status: He is alert.     Lab Results Lab Results  Component Value Date   WBC 11.9 (H) 07/16/2022   HGB 11.1 (L) 07/16/2022   HCT 33.5 (L)  07/16/2022   MCV 86.3 07/16/2022   PLT 274 07/16/2022    Lab Results  Component Value Date   CREATININE 6.23 (H) 07/16/2022   BUN 146 (H) 07/16/2022   NA 144 07/16/2022   K 5.0 07/16/2022   CL 111 07/16/2022   CO2 17 (L) 07/16/2022    Lab Results  Component Value Date   ALT 61 (H) 07/15/2022   AST 64 (H) 07/15/2022   ALKPHOS 60 07/15/2022   BILITOT 0.8 07/15/2022     Microbiology: Recent  Results (from the past 240 hour(s))  Blood Culture (routine x 2)     Status: None (Preliminary result)   Collection Time: 07/12/22 11:17 AM   Specimen: BLOOD RIGHT ARM  Result Value Ref Range Status   Specimen Description BLOOD RIGHT ARM  Final   Special Requests   Final    BOTTLES DRAWN AEROBIC AND ANAEROBIC Blood Culture adequate volume   Culture   Final    NO GROWTH 4 DAYS Performed at [facility], [address], [place], [place] [address]    Report Status PENDING  Incomplete  Remove and replace urinary cath (placed > 5 days) then obtain urine culture from new indwelling urinary catheter.     Status: Abnormal   Collection Time: 07/12/22 11:37 AM   Specimen: Urine, Catheterized  Result Value Ref Range Status   Specimen Description URINE, CATHETERIZED  Final   Special Requests   Final    NONE Performed at [facility], [address] [place], [place] [address]    Culture (A)  Final    10,000 COLONIES/mL ESCHERICHIA COLI Confirmed Extended Spectrum Beta-Lactamase Producer (ESBL).  In bloodstream infections from ESBL organisms, carbapenems are preferred over piperacillin/tazobactam. They are shown to have a lower risk of mortality.    Report Status 07/14/2022 FINAL  Final   Organism ID, Bacteria ESCHERICHIA COLI (A)  Final      Susceptibility   Escherichia coli - MIC*    AMPICILLIN >=32 RESISTANT Resistant     CEFAZOLIN >=64 RESISTANT Resistant     CEFEPIME 16 RESISTANT Resistant     CEFTRIAXONE >=64 RESISTANT Resistant     CIPROFLOXACIN <=0.25 SENSITIVE Sensitive      GENTAMICIN >=16 RESISTANT Resistant     IMIPENEM <=0.25 SENSITIVE Sensitive     NITROFURANTOIN 64 INTERMEDIATE Intermediate     TRIMETH/SULFA >=320 RESISTANT Resistant     AMPICILLIN/SULBACTAM 16 INTERMEDIATE Intermediate     PIP/TAZO <=4 SENSITIVE Sensitive     * 10,000 COLONIES/mL ESCHERICHIA COLI  Blood Culture (routine x 2)     Status: Abnormal (Preliminary result)   Collection Time: 07/12/22 11:48 AM   Specimen: BLOOD RIGHT HAND  Result Value Ref Range Status   Specimen Description BLOOD RIGHT HAND  Final   Special Requests   Final    BOTTLES DRAWN AEROBIC AND ANAEROBIC Blood Culture adequate volume   Culture  Setup Time   Final    GRAM NEGATIVE RODS ANAEROBIC BOTTLE ONLY CRITICAL RESULT CALLED TO, READ BACK BY AND VERIFIED WITH: PHARMD JEvette Georges [id number] @1600$  FH    Culture (A)  Final    ESCHERICHIA COLI SUSCEPTIBILITIES TO FOLLOW Performed at [facility], [address], [place], [place] [address]    Report Status PENDING  Incomplete  Blood Culture ID Panel (Reflexed)     Status: Abnormal   Collection Time: 07/12/22 11:48 AM  Result Value Ref Range Status   Enterococcus faecalis NOT DETECTED NOT DETECTED Final   Enterococcus Faecium NOT DETECTED NOT DETECTED Final   Listeria monocytogenes NOT DETECTED NOT DETECTED Final   Staphylococcus species NOT DETECTED NOT DETECTED Final   Staphylococcus aureus ([facility]) NOT DETECTED NOT DETECTED Final   Staphylococcus epidermidis NOT DETECTED NOT DETECTED Final   Staphylococcus lugdunensis NOT DETECTED NOT DETECTED Final   Streptococcus species NOT DETECTED NOT DETECTED Final   Streptococcus agalactiae NOT DETECTED NOT DETECTED Final   Streptococcus pneumoniae NOT DETECTED NOT DETECTED Final   Streptococcus pyogenes NOT DETECTED NOT  DETECTED Final   A.calcoaceticus-baumannii NOT DETECTED NOT DETECTED Final   Bacteroides fragilis NOT DETECTED NOT DETECTED Final   Enterobacterales DETECTED (A) NOT DETECTED Final    Comment:  Enterobacterales represent a large order of gram negative bacteria, not a single organism. CRITICAL RESULT CALLED TO, READ BACK BY AND VERIFIED WITH: PHARMD J. CARNEY [id number] @1600$  FH    Enterobacter cloacae complex NOT DETECTED NOT DETECTED Final   Escherichia coli DETECTED (A) NOT DETECTED Final    Comment: CRITICAL RESULT CALLED TO, READ BACK BY AND VERIFIED WITH: PHARMD J. CARNEY [id number] @1600$  FH    Klebsiella aerogenes NOT DETECTED NOT DETECTED Final   Klebsiella oxytoca NOT DETECTED NOT DETECTED Final   Klebsiella pneumoniae NOT DETECTED NOT DETECTED Final   Proteus species NOT DETECTED NOT DETECTED Final   Salmonella species NOT DETECTED NOT DETECTED Final   Serratia marcescens NOT DETECTED NOT DETECTED Final   Haemophilus influenzae NOT DETECTED NOT DETECTED Final   Neisseria meningitidis NOT DETECTED NOT DETECTED Final   Pseudomonas aeruginosa NOT DETECTED NOT DETECTED Final   Stenotrophomonas maltophilia NOT DETECTED NOT DETECTED Final   Candida albicans NOT DETECTED NOT DETECTED Final   Candida auris NOT DETECTED NOT DETECTED Final   Candida glabrata NOT DETECTED NOT DETECTED Final   Candida krusei NOT DETECTED NOT DETECTED Final   Candida parapsilosis NOT DETECTED NOT DETECTED Final   Candida tropicalis NOT DETECTED NOT DETECTED Final   Cryptococcus neoformans/gattii NOT DETECTED NOT DETECTED Final   CTX-M ESBL DETECTED (A) NOT DETECTED Final    Comment: CRITICAL RESULT CALLED TO, READ BACK BY AND VERIFIED WITH: PHARMD J. CARNEY [id number] @1600$  FH    Carbapenem resistance IMP NOT DETECTED NOT DETECTED Final   Carbapenem resistance KPC NOT DETECTED NOT DETECTED Final   Carbapenem resistance NDM NOT DETECTED NOT DETECTED Final   Carbapenem resist OXA 48 LIKE NOT DETECTED NOT DETECTED Final   Carbapenem resistance VIM NOT DETECTED NOT DETECTED Final    Comment: Performed at [facility], [address], [place], [place] [address]  Resp panel by RT-PCR (RSV, Flu A&B,  Covid) Anterior Nasal Swab     Status: None   Collection Time: 07/12/22  6:50 PM   Specimen: Anterior Nasal Swab  Result Value Ref Range Status   SARS Coronavirus 2 by RT PCR NEGATIVE NEGATIVE Final   Influenza A by PCR NEGATIVE NEGATIVE Final   Influenza B by PCR NEGATIVE NEGATIVE Final    Comment: (NOTE) The Xpert Xpress SARS-CoV-2/FLU/RSV plus assay is intended as an aid in the diagnosis of influenza from Nasopharyngeal swab specimens and should not be used as a sole basis for treatment. Nasal washings and aspirates are unacceptable for Xpert Xpress SARS-CoV-2/FLU/RSV testing.  Fact Sheet for Patients: EntrepreneurPulse.com.au  Fact Sheet for Healthcare Providers: IncredibleEmployment.be  This test is not yet approved or cleared by the [place] FDA and has been authorized for detection and/or diagnosis of SARS-CoV-2 by FDA under an Emergency Use Authorization (EUA). This EUA will remain in effect (meaning this test can be used) for the duration of the COVID-19 declaration under Section 564(b)(1) of the Act, 21 U.S.C. section 360bbb-3(b)(1), unless the authorization is terminated or revoked.     Resp Syncytial Virus by PCR NEGATIVE NEGATIVE Final    Comment: (NOTE) Fact Sheet for Patients: EntrepreneurPulse.com.au  Fact Sheet for Healthcare Providers: IncredibleEmployment.be  This test is not yet approved or cleared by the [place] FDA and has been authorized for detection and/or  diagnosis of SARS-CoV-2 by FDA under an Emergency Use Authorization (EUA). This EUA will remain in effect (meaning this test can be used) for the duration of the COVID-19 declaration under Section 564(b)(1) of the Act, 21 U.S.C. section 360bbb-3(b)(1), unless the authorization is terminated or revoked.  Performed at [facility], [place] [address], [place], [place] [address]   MRSA Next Gen by PCR, Nasal      Status: Abnormal   Collection Time: 07/13/22 11:20 AM   Specimen: Nasal Mucosa; Nasal Swab  Result Value Ref Range Status   MRSA by PCR Next Gen DETECTED (A) NOT DETECTED Final    Comment: RESULT CALLED TO, READ BACK BY AND VERIFIED WITH: BKarlton Lemon [id number] @1323$  FH (NOTE) The GeneXpert MRSA Assay (FDA approved for NASAL specimens only), is one component of a comprehensive MRSA colonization surveillance program. It is not intended to diagnose MRSA infection nor to guide or monitor treatment for MRSA infections. Test performance is not FDA approved in patients less than 66 years old. Performed at [facility], [place] [address], [place], [place] [address]      Terri Piedra, [place] [facility] Infectious [facility]  07/16/2022  1:08 PM

## 2022-07-16 NOTE — Procedures (Signed)
Interventional Radiology Procedure Note  Procedure: Placement of a right IJ approach temp triple lumen HD cath.   Tip is positioned at the superior cavoatrial junction and catheter is ready for immediate use.  Complications: None Recommendations:  - Ok to use - Do not submerge - Routine line care   Signed,  Dulcy Fanny. Earleen Newport, DO

## 2022-07-16 NOTE — Progress Notes (Signed)
Patient left the floor to go to dialysis. Cardiac monitor hooked up to patient and safety ensured for transport.

## 2022-07-16 NOTE — Assessment & Plan Note (Addendum)
Per palliative, continue with current medical interventions through the weekend and reassess for residential hospice vs SNF at the beginning of the week. Continue discussion with daughter and family.  - Palliative consulted, appreciate recs - SNF vs hospice pending family decision

## 2022-07-16 NOTE — Plan of Care (Signed)

## 2022-07-16 NOTE — Progress Notes (Signed)
[facility] Progress Note   Subjective:   Pt awake and alert but no significant history provided. UOP 2050 yesterday.   No family at bedside.  BUN remains in 140s, Cr 6.3.    Objective Vitals:   07/15/22 1535 07/15/22 1934 07/16/22 0309 07/16/22 0800  BP: 131/75 (!) 103/58 (!) 126/56 (!) 138/54  Pulse: 66 80 70 74  Resp: 18 16 17 16  $ Temp: 98.1 F (36.7 C) 98 F (36.7 C) 98 F (36.7 C)   TempSrc: Axillary Axillary Axillary   SpO2: 98%     Weight:      Height:       Physical Exam Gen: cachectic frail  Eyes: anicteric ENT: MMM Neck: no JVD CV:  RRR Abd: soft, thin, nontender Lungs: clear GU: foley in place - looks fine, draining somewhat cloudy urine Extr:  no edema Neuro: awake and alert, oriented to self o/w not  Additional Objective Labs: Basic Metabolic Panel: Recent Labs  Lab 07/14/22 0640 07/15/22 0255 07/16/22 0521  NA 148* 146* 144  K 4.8 4.6 5.0  CL 114* 118* 111  CO2 19* 17* 17*  GLUCOSE 213* 202* 156*  BUN 143* 142* 146*  CREATININE 6.29* 6.28* 6.23*  CALCIUM 9.2 8.8* 9.2  PHOS  --  6.2* 5.8*    Liver Function Tests: Recent Labs  Lab 07/13/22 0517 07/14/22 0640 07/15/22 0255 07/16/22 0521  AST 122* 89* 64*  --   ALT 88* 78* 61*  --   ALKPHOS 69 71 60  --   BILITOT 0.5 0.6 0.8  --   PROT 6.9 6.8 6.7  --   ALBUMIN 1.9* 1.8* 1.6* 1.6*    No results for input(s): "LIPASE", "AMYLASE" in the last 168 hours. CBC: Recent Labs  Lab 07/12/22 1115 07/13/22 0517 07/14/22 0640 07/15/22 0255 07/16/22 0521  WBC 14.3* 14.9* 12.6* 12.6* 11.9*  NEUTROABS 12.8*  --   --   --   --   HGB 11.5* 11.0* 11.4* 10.5* 11.1*  HCT 35.5* 34.3* 36.6* 33.6* 33.5*  MCV 89.6 88.9 90.6 90.3 86.3  PLT 257 234 251 249 274    Blood Culture    Component Value Date/Time   SDES BLOOD RIGHT HAND 07/12/2022 1148   SPECREQUEST  07/12/2022 1148    BOTTLES DRAWN AEROBIC AND ANAEROBIC Blood Culture adequate volume   CULT (A) 07/12/2022 1148     ESCHERICHIA COLI SUSCEPTIBILITIES TO FOLLOW Performed at [facility], [place] [address] [place], [place] [address]    REPTSTATUS PENDING 07/12/2022 1148    Cardiac Enzymes: No results for input(s): "CKTOTAL", "CKMB", "CKMBINDEX", "TROPONINI" in the last 168 hours. CBG: Recent Labs  Lab 07/15/22 0819 07/15/22 1225 07/15/22 2100 07/16/22 0824 07/16/22 1142  GLUCAP 152* 184* 308* 169* 167*    Iron Studies: No results for input(s): "IRON", "TIBC", "TRANSFERRIN", "FERRITIN" in the last 72 hours. @lablastinr3$ @ Studies/Results: No results found. Medications:  meropenem (MERREM) IV 1 g (07/16/22 0957)    atorvastatin  80 mg Oral QHS   busPIRone  5 mg Oral TID   Chlorhexidine Gluconate Cloth  6 each Topical Daily   famotidine  20 mg Oral Daily   heparin  5,000 Units Subcutaneous Q8H   insulin aspart  0-9 Units Subcutaneous TID WC   metoprolol succinate  12.5 mg Oral Daily   Assessment/Plan **Sepsis secondary to UTI: on presentation.  Appears to be improving on antibiotic therapy and foley s/p change out.    **AKI on CKD 3:  Baseline kidney function as of 04/2022 in the 1.73m/dL range.  Now with severe, nonoliguric AKI.  Suspect multifactorial with contributions of sepsis/hypoperfusion, pyelonephritis, hypovolemia, obstruction.  In setting of poor po intake gave fluid challenge 2/7 but unfortunately no improvement in kidney function yet and BUN in 140s with mental status worse than baseline.    I had a long talk with daughter HCPOA yesterday - as not improving with supportive can offer a time limited trial of dialysis (1 week) to see if with additional time kidneys recover.  If not I would recommend hospice - give comorbids and frailty not a long term HD candidate.  She d/w brother and they are agreeable.   Consulted IR for temp HD catheter today and plan for HD #1 today, #2 tomorrow.   Cont to hold SGLT2i.   **HFrEF:   2022 EF 20-25%, judicious use of fluids PRN - none currently  indicated.  Med mgmt limited by GFR currently.    **metabolic acidosis:  Bicarb 17, started oral bicarb.   **Anemia:  Hb 11s, follow.   **Dementia: SNF resident - seems to be fair way from usual baseline according to daughter; certainly with a BUN 140s could be contributing to AMS. Dialysis as above.   Will follow - reach out with concerns.     LJannifer HickMD 07/16/2022, 11:44 AM  [facility] Pager: [phone]

## 2022-07-16 NOTE — Progress Notes (Signed)
  Transition of Care [facility]) Screening Note   Patient Details  Name: [name] Date of Birth: January 17, 1941   [facility] CM/SW Contact:    Benard Halsted, LCSW Phone Number: 07/16/2022, 6:23 PM    Transition of Care Department [facility]) has reviewed patient from [facility]. We will continue to monitor patient advancement through interdisciplinary progression rounds. If new patient transition needs arise, please place a TOC consult.

## 2022-07-16 NOTE — Progress Notes (Signed)
     Daily Progress Note Intern Pager: [phone]  Patient name: [name] Medical record number: [id number] Date of birth: 12/04/1940 Age: 82 y.o. Gender: male  Primary Care Provider: [name], DO Consultants: Nephrology, Palliative  Code Status: Full  Pt Overview and Major Events to Date:  2/5 Admitted  Assessment and Plan: HB is a 82 yo M admitted for sepsis, likely from pyelonephritis, and ARF.   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   * Bacteremia due to Escherichia coli Bcx grew ESBL, most likely from urinary source. Pt has now switched to meropenem yesterday. Remains afeb.  - Cont Meropenem (2/8- ) - Tylenol prn for fever - AM CBC, BMP  AKI (acute kidney injury) ([place]) Cr remains elevated and GFR remains stably low. UOP 2L yesterday. Had suprapubic tenderness today, but foley is draining. - Nephrology consulted, appreciate recs - Plan for HD cath today and 1 wk of HD. Family is aware pt is not a good candidate for dialysis long-term - Indwelling foley - Strict I/O's  Encephalopathy - Fall and delirium precautions   Chronic HFrEF (heart failure with reduced ejection fraction) ([place]) Echo 2022 showed EF 20-25%. - Cont home metop - Hold Lasix given AKI  Goals of care, counseling/discussion Palliative consulted. Daughter wishes to keep pt Full code, but understands that it may need to be switched if going to hospice.  - Palliative consulted, appreciate recs - Daughter [name] is main HCPOA. Son Hamid, is secondary contact. There is another son that is not involved.   T2DM (type 2 diabetes mellitus) ([place]) - Cont sSSI   FEN/GI: Dysphagia PPx: SQ Hep Dispo:Pending PT recommendations  pending clinical improvement . Barriers include need for IV antibiotics and dialysis.   Subjective:  NAEO.   Objective: Temp:  [97.9 F  (36.6 C)-98.1 F (36.7 C)] 98 F (36.7 C) (02/09 0309) Pulse Rate:  [66-84] 74 (02/09 0800) Resp:  [16-18] 16 (02/09 0800) BP: (103-138)/(54-75) 138/54 (02/09 0800) SpO2:  [98 %] 98 % (02/08 1535) Physical Exam: General: Alert, pleasantly demented. NAD. Neuro: A&O to self, not place or time Cardiovascular: RRR Respiratory: CTAB. Normal WOB on RA Abdomen: Tender to palpation in suprapubic region  Laboratory: Most recent CBC Lab Results  Component Value Date   WBC 11.9 (H) 07/16/2022   HGB 11.1 (L) 07/16/2022   HCT 33.5 (L) 07/16/2022   MCV 86.3 07/16/2022   PLT 274 07/16/2022   Most recent BMP    Latest Ref Rng & Units 07/16/2022    5:21 AM  BMP  Glucose 70 - 99 mg/dL 156   BUN 8 - 23 mg/dL 146   Creatinine 0.61 - 1.24 mg/dL 6.23   Sodium 135 - 145 mmol/L 144   Potassium 3.5 - 5.1 mmol/L 5.0   Chloride 98 - 111 mmol/L 111   CO2 22 - 32 mmol/L 17   Calcium 8.9 - 10.3 mg/dL 9.2     Arlyce Dice, MD 07/16/2022, 9:50 AM  PGY-1, [place] Intern pager: [phone], text pages welcome Secure chat group [place]

## 2022-07-16 NOTE — Plan of Care (Signed)
Spoke to ID (Dr. Tommy Medal) via Epic chat. ID plans to consult on pt later today.

## 2022-07-17 DIAGNOSIS — R7881 Bacteremia: Secondary | ICD-10-CM

## 2022-07-17 DIAGNOSIS — B9620 Unspecified Escherichia coli [E. coli] as the cause of diseases classified elsewhere: Secondary | ICD-10-CM | POA: Diagnosis not present

## 2022-07-17 DIAGNOSIS — Z515 Encounter for palliative care: Secondary | ICD-10-CM | POA: Diagnosis not present

## 2022-07-17 LAB — CULTURE, BLOOD (ROUTINE X 2)
Culture: NO GROWTH
Special Requests: ADEQUATE
Special Requests: ADEQUATE

## 2022-07-17 LAB — CBC
HCT: 32.8 % — ABNORMAL LOW (ref 39.0–52.0)
Hemoglobin: 10.7 g/dL — ABNORMAL LOW (ref 13.0–17.0)
MCH: 27.9 pg (ref 26.0–34.0)
MCHC: 32.6 g/dL (ref 30.0–36.0)
MCV: 85.6 fL (ref 80.0–100.0)
Platelets: 302 10*3/uL (ref 150–400)
RBC: 3.83 MIL/uL — ABNORMAL LOW (ref 4.22–5.81)
RDW: 14.6 % (ref 11.5–15.5)
WBC: 14.1 10*3/uL — ABNORMAL HIGH (ref 4.0–10.5)
nRBC: 0 % (ref 0.0–0.2)

## 2022-07-17 LAB — MAGNESIUM: Magnesium: 2.1 mg/dL (ref 1.7–2.4)

## 2022-07-17 LAB — RENAL FUNCTION PANEL
Albumin: 1.7 g/dL — ABNORMAL LOW (ref 3.5–5.0)
Anion gap: 13 (ref 5–15)
BUN: 86 mg/dL — ABNORMAL HIGH (ref 8–23)
CO2: 22 mmol/L (ref 22–32)
Calcium: 8.7 mg/dL — ABNORMAL LOW (ref 8.9–10.3)
Chloride: 105 mmol/L (ref 98–111)
Creatinine, Ser: 4.27 mg/dL — ABNORMAL HIGH (ref 0.61–1.24)
GFR, Estimated: 13 mL/min — ABNORMAL LOW (ref >60)
Glucose, Bld: 140 mg/dL — ABNORMAL HIGH (ref 70–99)
Phosphorus: 4.6 mg/dL (ref 2.5–4.6)
Potassium: 4 mmol/L (ref 3.5–5.1)
Sodium: 140 mmol/L (ref 135–145)

## 2022-07-17 LAB — HEPATITIS B SURFACE ANTIBODY, QUANTITATIVE: Hep B S AB Quant (Post): <3.1 m[IU]/mL — ABNORMAL LOW (ref >9.9)

## 2022-07-17 LAB — GLUCOSE, CAPILLARY
Glucose-Capillary: 129 mg/dL — ABNORMAL HIGH (ref 70–99)
Glucose-Capillary: 131 mg/dL — ABNORMAL HIGH (ref 70–99)
Glucose-Capillary: 97 mg/dL (ref 70–99)
Glucose-Capillary: 114 mg/dL — ABNORMAL HIGH (ref 70–99)

## 2022-07-17 MED ORDER — HEPARIN SODIUM (PORCINE) 1000 UNIT/ML IJ SOLN
2600.0000 [IU] | Freq: Once | INTRAMUSCULAR | Status: AC
  Administered 2022-07-17: 2600 [IU]

## 2022-07-17 MED ORDER — HEPARIN SODIUM (PORCINE) 1000 UNIT/ML IJ SOLN
INTRAMUSCULAR | Status: AC
  Filled 2022-07-17: qty 4

## 2022-07-17 NOTE — Progress Notes (Signed)
   Palliative Medicine Inpatient Follow Up Note HPI:  82 y.o. male admitted 07/12/22 from [facility] SNF with decreased urine output, AKI, UTI and pyelonephritis. PMHx; dementia, hearing impairment, HFrEF 20%, ICM, HTN, HLD, T2DM, goiter, hyperthyroidism, gout, chronic foley, tobacco use.    Palliative care has been asked to get involved in the setting of progressive kidney failure to further address goals of care.   Today's Discussion 07/17/2022  *Please note that this is a verbal dictation therefore any spelling or grammatical errors are due to the "[employer]" system interpretation.  Chart reviewed inclusive of vital signs, progress notes, laboratory results, and diagnostic images. Patient is eating little to nothing.   I was able to reach out to nursing staff who have no significant concerns at this time.  I met with [name] at bedside this late afternoon. He was disoriented and could not tell me where he was nor why he was here. He is aware of name though at the time of assessment that appeared to be all.   I spoke to patients HCPOA, [name] this afternoon. She shares that [name] started HD yesterday and she remains hopeful that he may improve. She understands the possibilities long term of this not occurring. Created space and opportunity for [name] to explore thoughts feelings and fears regarding [name]'s current medical situation.  Received a copy of HCPOA documentation which will be scanned into [employer].   Questions and concerns addressed/Palliative Support Provided.   Objective Assessment: Vital Signs Vitals:   07/17/22 1222 07/17/22 1340  BP: (!) 98/44 120/68  Pulse: 78 79  Resp: 20 18  Temp: 98.1 F (36.7 C) 99.1 F (37.3 C)  SpO2: 100% 100%    Intake/Output Summary (Last 24 hours) at 07/17/2022 1555 Last data filed at 07/17/2022 1340 Gross per 24 hour  Intake --  Output 2525 ml  Net -2525 ml   Last Weight  Most recent update: 07/17/2022  6:08 AM     Weight  58.7 kg (129 lb 6.6 oz)            Gen: Frail elderly AA M in NAD HEENT: moist mucous membranes CV: Irregular rate and regular rhythm  PULM:  On RA, breathing is even and nonlabored ABD: soft/nontender  EXT: No edema  Neuro: Pleasantly confused  SUMMARY OF RECOMMENDATIONS   FULL CODE --> For the time being   Plan to allow time for outcomes   Have obtained a copy of AD's for EMR   Patients daughter is aware of the likely possibility of hospice if no improvement after a one week trial of dialysis  Delirium precautions  Have requested a nutrition consult for adult FTT   Ongoing support  Billing based on MDM: Moderate  ______________________________________________________________________________________ [facility] Team Team Cell Phone: [phone] Please utilize secure chat with additional questions, if there is no response within 30 minutes please call the above phone number  Palliative Medicine Team providers are available by phone from 7am to 7pm daily and can be reached through the team cell phone.  Should this patient require assistance outside of these hours, please call the patient's attending physician.

## 2022-07-17 NOTE — Progress Notes (Signed)
   INFECTIOUS DISEASE ATTENDING ADDENDUM:  Patient with ESBL bacteremia with apparent urinary source  Activities are back on blood culture isolate on urine culture and they are identical   I would complete 7 days of carbapenem therapy  I will sign off for now  Please call with further questions.

## 2022-07-17 NOTE — Progress Notes (Signed)
Patient arrived back to floor, V/S taken, bed alarm on and safety ensured.

## 2022-07-17 NOTE — Progress Notes (Signed)
Received patient in bed to unit.  Alert and oriented.  Informed consent signed and in chart.   TX duration: 2.20  Patient tolerated well.  Transported back to the room  Alert, without acute distress.  Hand-off given to patient's nurse.   Access used: catheter Access issues: none  Total UF removed: 0 Medication(s) given: 0 Post HD weight: 58.7 kg     07/16/22 2353  Vitals  Temp 97.8 F (36.6 C)  Temp Source Oral  BP (!) 108/52  MAP (mmHg) 66  BP Location Left Arm  BP Method Automatic  Patient Position (if appropriate) Lying  Pulse Rate 69  Pulse Rate Source Monitor  ECG Heart Rate 77  Resp (!) 23  Oxygen Therapy  SpO2 100 %  O2 Device Room Air  Post Treatment  Dialyzer Clearance Lightly streaked  Duration of HD Treatment -hour(s) 2.2 hour(s)  Hemodialysis Intake (mL) 400 mL  Liters Processed 26.8  Fluid Removed (mL) 0 mL  Tolerated HD Treatment Yes  Post-Hemodialysis Comments BP dropped, 400 ml of NS administered to support the BP, pt has been asymptomatic. HD tx completed, tolerated well, pt is stable.  AVG/AVF Arterial Site Held (minutes) 0 minutes  AVG/AVF Venous Site Held (minutes) 0 minutes  Note  Observations alert and stable.  Hemodialysis Catheter Right Internal jugular Triple lumen Temporary (Non-Tunneled)  Placement Date/Time: 07/16/22 1339   Serial / Lot #: [id number]  Expiration Date: 04/06/27  Time Out: Correct patient;Correct site;Correct procedure  Maximum sterile barrier precautions: Hand hygiene;Cap;Mask;Sterile gown;Sterile gloves;Large sterile ...  Site Condition No complications  Blue Lumen Status Flushed;Heparin locked;Dead end cap in place  Red Lumen Status Flushed;Heparin locked;Dead end cap in place  Purple Lumen Status N/A  Catheter fill solution Heparin 1000 units/ml  Catheter fill volume (Arterial) 1.3 cc  Catheter fill volume (Venous) 1.3  Dressing Type Gauze/Drain sponge  Dressing Status Clean, Dry, Intact  Drainage Description  None  Post treatment catheter status Capped and Clamped

## 2022-07-17 NOTE — Progress Notes (Signed)
[facility] Progress Note   Subjective:   Pt awake and alert but no significant history provided. UOP 1600 yesterday.   No family at bedside.  Had temp HD catheter placed and 1st HD overnight - tolerated fine.  For #2 later today.   Objective Vitals:   07/17/22 0030 07/17/22 0400 07/17/22 0500 07/17/22 0702  BP:    (!) 110/56  Pulse: 79 84  79  Resp:  15  17  Temp:    98 F (36.7 C)  TempSrc:    Axillary  SpO2: 94% 96%  98%  Weight:   58.7 kg   Height:       Physical Exam Gen: cachectic frail  Eyes: anicteric ENT: MMM Neck: no JVD CV:  RRR Abd: soft, thin, nontender Lungs: clear GU: foley in place - looks fine, draining somewhat cloudy urine Extr:  no edema Neuro: awake and alert, oriented to self o/w not  Additional Objective Labs: Basic Metabolic Panel: Recent Labs  Lab 07/15/22 0255 07/16/22 0521 07/17/22 0403  NA 146* 144 140  K 4.6 5.0 4.0  CL 118* 111 105  CO2 17* 17* 22  GLUCOSE 202* 156* 140*  BUN 142* 146* 86*  CREATININE 6.28* 6.23* 4.27*  CALCIUM 8.8* 9.2 8.7*  PHOS 6.2* 5.8* 4.6    Liver Function Tests: Recent Labs  Lab 07/13/22 0517 07/14/22 0640 07/15/22 0255 07/16/22 0521 07/17/22 0403  AST 122* 89* 64*  --   --   ALT 88* 78* 61*  --   --   ALKPHOS 69 71 60  --   --   BILITOT 0.5 0.6 0.8  --   --   PROT 6.9 6.8 6.7  --   --   ALBUMIN 1.9* 1.8* 1.6* 1.6* 1.7*    No results for input(s): "LIPASE", "AMYLASE" in the last 168 hours. CBC: Recent Labs  Lab 07/12/22 1115 07/13/22 0517 07/14/22 0640 07/15/22 0255 07/16/22 0521 07/17/22 0403  WBC 14.3* 14.9* 12.6* 12.6* 11.9* 14.1*  NEUTROABS 12.8*  --   --   --   --   --   HGB 11.5* 11.0* 11.4* 10.5* 11.1* 10.7*  HCT 35.5* 34.3* 36.6* 33.6* 33.5* 32.8*  MCV 89.6 88.9 90.6 90.3 86.3 85.6  PLT 257 234 251 249 274 302    Blood Culture    Component Value Date/Time   SDES BLOOD RIGHT HAND 07/12/2022 1148   SPECREQUEST  07/12/2022 1148    BOTTLES DRAWN AEROBIC  AND ANAEROBIC Blood Culture adequate volume   CULT (A) 07/12/2022 1148    ESCHERICHIA COLI Confirmed Extended Spectrum Beta-Lactamase Producer (ESBL).  In bloodstream infections from ESBL organisms, carbapenems are preferred over piperacillin/tazobactam. They are shown to have a lower risk of mortality.    REPTSTATUS 07/17/2022 FINAL 07/12/2022 1148    Cardiac Enzymes: No results for input(s): "CKTOTAL", "CKMB", "CKMBINDEX", "TROPONINI" in the last 168 hours. CBG: Recent Labs  Lab 07/16/22 0824 07/16/22 1142 07/16/22 1529 07/17/22 0250 07/17/22 0750  GLUCAP 169* 167* 163* 129* 131*    Iron Studies: No results for input(s): "IRON", "TIBC", "TRANSFERRIN", "FERRITIN" in the last 72 hours. @lablastinr3$ @ Studies/Results: IR Fluoro Guide CV Line Right  Result Date: 07/16/2022 INDICATION: 82 year old male referred for temporary hemodialysis catheter EXAM: IMAGE GUIDED TEMPORARY HEMODIALYSIS CATHETER MEDICATIONS: NONE ANESTHESIA/SEDATION: NONE FLUOROSCOPY: Radiation Exposure Index (as provided by the fluoroscopic device): 0 mGy Kerma COMPLICATIONS: None PROCEDURE: Informed written consent was obtained from the patient's family after a discussion of the risks, benefits, and  alternatives to treatment. Questions regarding the procedure were encouraged and answered. The right neck was prepped with chlorhexidine in a sterile fashion, and a sterile drape was applied covering the operative field. Maximum barrier sterile technique with sterile gowns and gloves were used for the procedure. A timeout was performed prior to the initiation of the procedure. A micropuncture kit was utilized to access the right internal jugular vein under direct, real-time ultrasound guidance after the overlying soft tissues were anesthetized with 1% lidocaine with epinephrine. Ultrasound image documentation was performed. The microwire was kinked to measure appropriate catheter length. A stiff glidewire was advanced to the level  of the IVC. A 16 cm hemodialysis catheter was then placed over the wire. Final catheter positioning was confirmed and documented with a spot radiographic image. The catheter aspirates and flushes normally. The catheter was flushed with appropriate volume heparin dwells. Dressings were applied. The patient tolerated the procedure well without immediate post procedural complication. IMPRESSION: Status post right IJ temporary hemodialysis catheter Signed, Dulcy Fanny. Nadene Rubins, RPVI [facility] Radiology Electronically Signed   By: Corrie Mckusick D.O.   On: 07/16/2022 14:00   IR US Guide Vasc Access Right  Result Date: 07/16/2022 INDICATION: 82 year old male referred for temporary hemodialysis catheter EXAM: IMAGE GUIDED TEMPORARY HEMODIALYSIS CATHETER MEDICATIONS: NONE ANESTHESIA/SEDATION: NONE FLUOROSCOPY: Radiation Exposure Index (as provided by the fluoroscopic device): 0 mGy Kerma COMPLICATIONS: None PROCEDURE: Informed written consent was obtained from the patient's family after a discussion of the risks, benefits, and alternatives to treatment. Questions regarding the procedure were encouraged and answered. The right neck was prepped with chlorhexidine in a sterile fashion, and a sterile drape was applied covering the operative field. Maximum barrier sterile technique with sterile gowns and gloves were used for the procedure. A timeout was performed prior to the initiation of the procedure. A micropuncture kit was utilized to access the right internal jugular vein under direct, real-time ultrasound guidance after the overlying soft tissues were anesthetized with 1% lidocaine with epinephrine. Ultrasound image documentation was performed. The microwire was kinked to measure appropriate catheter length. A stiff glidewire was advanced to the level of the IVC. A 16 cm hemodialysis catheter was then placed over the wire. Final catheter positioning was confirmed  and documented with a spot radiographic image. The catheter aspirates and flushes normally. The catheter was flushed with appropriate volume heparin dwells. Dressings were applied. The patient tolerated the procedure well without immediate post procedural complication. IMPRESSION: Status post right IJ temporary hemodialysis catheter Signed, Dulcy Fanny. Nadene Rubins, RPVI [facility]iology Electronically Signed   By: Corrie Mckusick D.O.   On: 07/16/2022 14:00   Medications:  meropenem (MERREM) IV 1 g (07/16/22 0957)    atorvastatin  80 mg Oral QHS   busPIRone  5 mg Oral TID   Chlorhexidine Gluconate Cloth  6 each Topical Daily   Chlorhexidine Gluconate Cloth  6 each Topical Q0600   famotidine  20 mg Oral Daily   heparin  5,000 Units Subcutaneous Q8H   insulin aspart  0-9 Units Subcutaneous TID WC   metoprolol succinate  12.5 mg Oral Daily   Assessment/Plan **Sepsis secondary to UTI: on presentation.  Appears to be improving on antibiotic therapy and foley s/p change out.    **AKI on CKD 3:  Baseline kidney function as of 04/2022 in the 1.61m/dL range.  Now with severe, nonoliguric AKI.  Suspect multifactorial with contributions of sepsis/hypoperfusion, pyelonephritis,  hypovolemia, obstruction.  In setting of poor po intake gave fluid challenge 2/7 but unfortunately no improvement in kidney function yet and BUN in 140s with mental status worse than baseline.    I had a long talk with daughter Chauncey Reading- as not improving with supportive can offer a time limited trial of dialysis (1 week) to see if with additional time kidneys recover.  If not I would recommend hospice - give comorbids and frailty not a long term HD candidate.  She d/w brother and they are agreeable.  Had HD #1 on 2/9, will have #2 2/10.  Continue supportive care -- maintaining euvolemia, avoiding nephrotoxins and hypotension.   **HFrEF:   2022 EF 20-25%, judicious use of fluids PRN -  none currently indicated.  Med mgmt limited by GFR currently.    **metabolic acidosis:  Bicarb 17, started oral bicarb.   **Anemia:  Hb 11s, follow.   **Dementia: SNF resident - seems to be fair way from usual baseline according to daughter; certainly with a BUN 140s could be contributing to AMS. Dialysis as above.   Will follow - reach out with concerns.     Jannifer Hick MD 07/17/2022, 8:58 AM  [facility] Pager: [phone]

## 2022-07-17 NOTE — Progress Notes (Signed)
Daily Progress Note Intern Pager: [phone]  Patient name: [name] Medical record number: [id number] Date of birth: [birth year] Age: 82 y.o. Gender: male  Primary Care Provider: [name], DO Consultants: nephro, palliative, ID Code Status: full  Pt Overview and Major Events to Date:  2/5: admitted 2/7: nephrology consulted 2/8: palliative consulted; Blood cx growing ESBL 2/9: ID consulted; temp HD catheter placed, 1st HD session  Assessment and Plan:  [name] is an 82 y.o. male admitted for sepsis likely secondary to urinary source and acute renal failure. Pertinent PMH/PSH includes chronic indwelling foley, dementia, T2DM, HFrEF (EF 20-25%), HTN, tobacco use, goiter.   * Bacteremia due to Escherichia coli Bcx grew ESBL, most likely from urinary source. Persistent leukocytosis to 14 but remains afebrile. - ID following, appreciate recommendations - f/u sensitivities - Continue Meropenem (2/8- ) - Tylenol prn for fever - AM CBC  AKI (acute kidney injury) (HCC) Severe (Cr up to 6 from baseline of 1.3) but non-oliguric (850 cc UOP overnight). Had 1st HD session yesterday 2/9. Etiology multifactorial from sepsis and foley obstruction. - Nephrology consulted, appreciate recs - Planning for HD #2 today - Overall plan for 1 wk trial of HD to see if renal recovery. Family is aware pt is not a good candidate for dialysis long-term - Indwelling foley - Strict I/O's - Daily RFP - Avoid nephrotoxic agents  Encephalopathy Does not participate meaningfully in my exam this morning although it was quite early (5:30am). Multifactorial etiology 2/2 acute renal failure/uremia as well as baseline dementia with delirium. - Treatment of renal failure as above - Monitor mental status - Appreciate palliative involvement - Fall and delirium precautions - Consider discontinuing buspar. Unclear why patient on this at home. ?for anxiety or behavior disturbance  Chronic  HFrEF (heart failure with reduced ejection fraction) ([place]) Echo 2022 showed EF 20-25%. No signs of acute exacerbation. - Cont home Toprol 12.39m daily - Hold Lasix and Jardiance given AKI  Goals of care, counseling/discussion Palliative consulted. Daughter wishes to keep pt Full code, but understands that it may need to be switched if going to hospice.  - Palliative consulted, appreciate recs - Daughter SOdis Lusteris main HCPOA. Son HRomas is secondary contact. There is another son that is not involved.   T2DM (type 2 diabetes mellitus) ([place] have been adequately controlled throughout hospitalization - Cont sSSI - Holding home Jardiance - CBG monitoring qAC and qHS  Chronic and stable conditions: HLD: continue home atorvastatin 864mdaily. Consider d/c'ing pending goals of care GERD: continue pepcid 2062m FEN/GI: DYS 1 diet PPx: Heparin Dispo:SNF pending clinical improvement .   Subjective:  No acute events overnight. Patient alert but unable to provide any significant history this morning.  Objective: Temp:  [97.8 F (36.6 C)-98.9 F (37.2 C)] 98 F (36.7 C) (02/10 0702) Pulse Rate:  [40-84] 79 (02/10 0702) Resp:  [14-25] 17 (02/10 0702) BP: (74-138)/(41-96) 110/56 (02/10 0702) SpO2:  [94 %-100 %] 98 % (02/10 0702) Weight:  [54.7 kg-58.7 kg] 58.7 kg (02/10 0500) Physical Exam: General: cachectic, chronically ill appearing, NAD Neck: R temp ID in place, bandage c/d/i Cardiovascular: RRR, normal S1/S2 Respiratory: normal effort on room air, lungs CTA anteriorly Abdomen: soft, NTND Extremities: no peripheral edema Neuro: awake, follows some very basic commands, no obvious focal deficits but does not provide any intelligible speech, oriented x0?  Laboratory: Most recent CBC Lab Results  Component Value Date   WBC 14.1 (H) 07/17/2022  HGB 10.7 (L) 07/17/2022   HCT 32.8 (L) 07/17/2022   MCV 85.6 07/17/2022   PLT 302 07/17/2022   Most recent BMP     Latest Ref Rng & Units 07/17/2022    4:03 AM  BMP  Glucose 70 - 99 mg/dL 140   BUN 8 - 23 mg/dL 86   Creatinine 0.61 - 1.24 mg/dL 4.27   Sodium 135 - 145 mmol/L 140   Potassium 3.5 - 5.1 mmol/L 4.0   Chloride 98 - 111 mmol/L 105   CO2 22 - 32 mmol/L 22   Calcium 8.9 - 10.3 mg/dL 8.7      Imaging/Diagnostic Tests: No pertinent imaging findings   Alcus Dad, MD 07/17/2022, 7:42 AM  PGY-3, [place] Intern pager: [phone], text pages welcome Secure chat group [place]

## 2022-07-17 NOTE — Progress Notes (Signed)
   07/17/22 1340  Vitals  BP 120/68  MAP (mmHg) 82  BP Location Left Arm  BP Method Automatic  Patient Position (if appropriate) Lying  Pulse Rate 79  Pulse Rate Source Monitor  ECG Heart Rate 77  Resp 18  Oxygen Therapy  SpO2 100 %  O2 Device Room Air  Patient Activity (if Appropriate) In bed  Pulse Oximetry Type Continuous   Received patient in bed to unit.  Alert and oriented.  Informed consent signed and in chart.   TX duration:2.5 hours  Patient tolerated well.  Transported back to the room  Alert, without acute distress.  Hand-off given to patient's nurse.   Access used: RIJ temporary DLC Access issues: no complications  Total UF removed: +200cc Medication(s) given: none    [name]

## 2022-07-18 DIAGNOSIS — R7881 Bacteremia: Secondary | ICD-10-CM | POA: Diagnosis not present

## 2022-07-18 DIAGNOSIS — Z515 Encounter for palliative care: Secondary | ICD-10-CM | POA: Diagnosis not present

## 2022-07-18 DIAGNOSIS — Z7189 Other specified counseling: Secondary | ICD-10-CM | POA: Diagnosis not present

## 2022-07-18 DIAGNOSIS — B9620 Unspecified Escherichia coli [E. coli] as the cause of diseases classified elsewhere: Secondary | ICD-10-CM | POA: Diagnosis not present

## 2022-07-18 LAB — RENAL FUNCTION PANEL
Albumin: 1.6 g/dL — ABNORMAL LOW (ref 3.5–5.0)
Anion gap: 10 (ref 5–15)
BUN: 66 mg/dL — ABNORMAL HIGH (ref 8–23)
CO2: 25 mmol/L (ref 22–32)
Calcium: 8.7 mg/dL — ABNORMAL LOW (ref 8.9–10.3)
Chloride: 102 mmol/L (ref 98–111)
Creatinine, Ser: 3.84 mg/dL — ABNORMAL HIGH (ref 0.61–1.24)
GFR, Estimated: 15 mL/min — ABNORMAL LOW (ref >60)
Glucose, Bld: 139 mg/dL — ABNORMAL HIGH (ref 70–99)
Phosphorus: 6.1 mg/dL — ABNORMAL HIGH (ref 2.5–4.6)
Potassium: 4.8 mmol/L (ref 3.5–5.1)
Sodium: 137 mmol/L (ref 135–145)

## 2022-07-18 LAB — GLUCOSE, CAPILLARY
Glucose-Capillary: 134 mg/dL — ABNORMAL HIGH (ref 70–99)
Glucose-Capillary: 158 mg/dL — ABNORMAL HIGH (ref 70–99)
Glucose-Capillary: 190 mg/dL — ABNORMAL HIGH (ref 70–99)
Glucose-Capillary: 193 mg/dL — ABNORMAL HIGH (ref 70–99)

## 2022-07-18 LAB — CBC
HCT: 33.9 % — ABNORMAL LOW (ref 39.0–52.0)
Hemoglobin: 10.9 g/dL — ABNORMAL LOW (ref 13.0–17.0)
MCH: 27.8 pg (ref 26.0–34.0)
MCHC: 32.2 g/dL (ref 30.0–36.0)
MCV: 86.5 fL (ref 80.0–100.0)
Platelets: 288 10*3/uL (ref 150–400)
RBC: 3.92 MIL/uL — ABNORMAL LOW (ref 4.22–5.81)
RDW: 15 % (ref 11.5–15.5)
WBC: 12.4 10*3/uL — ABNORMAL HIGH (ref 4.0–10.5)
nRBC: 0 % (ref 0.0–0.2)

## 2022-07-18 NOTE — Progress Notes (Addendum)
[facility] Progress Note   Subjective:   Pt awake and alert but no significant history provided. UOP 1850 yesterday.   No family at bedside.  Tolerated 2nd HD yesterday.    Objective Vitals:   07/17/22 1340 07/17/22 2000 07/18/22 0000 07/18/22 0400  BP: 120/68 (!) 127/49 (!) 129/57 (!) 138/96  Pulse: 79 100 89 77  Resp: 18 18 16 18  $ Temp: 99.1 F (37.3 C) 97.6 F (36.4 C) 98 F (36.7 C) 98.1 F (36.7 C)  TempSrc: Axillary Oral Axillary Axillary  SpO2: 100% 93% 98% 95%  Weight:      Height:       Physical Exam Gen: cachectic frail  Eyes: anicteric ENT: MMM Neck: no JVD CV:  RRR Abd: soft, thin, nontender Lungs: clear GU: foley in place - looks fine, draining somewhat cloudy urine Extr:  no edema Neuro: awake and alert, oriented to self o/w not --> no appreciable change in the past 48h  Additional Objective Labs: Basic Metabolic Panel: Recent Labs  Lab 07/15/22 0255 07/16/22 0521 07/17/22 0403  NA 146* 144 140  K 4.6 5.0 4.0  CL 118* 111 105  CO2 17* 17* 22  GLUCOSE 202* 156* 140*  BUN 142* 146* 86*  CREATININE 6.28* 6.23* 4.27*  CALCIUM 8.8* 9.2 8.7*  PHOS 6.2* 5.8* 4.6    Liver Function Tests: Recent Labs  Lab 07/13/22 0517 07/14/22 0640 07/15/22 0255 07/16/22 0521 07/17/22 0403  AST 122* 89* 64*  --   --   ALT 88* 78* 61*  --   --   ALKPHOS 69 71 60  --   --   BILITOT 0.5 0.6 0.8  --   --   PROT 6.9 6.8 6.7  --   --   ALBUMIN 1.9* 1.8* 1.6* 1.6* 1.7*    No results for input(s): "LIPASE", "AMYLASE" in the last 168 hours. CBC: Recent Labs  Lab 07/12/22 1115 07/13/22 0517 07/14/22 0640 07/15/22 0255 07/16/22 0521 07/17/22 0403 07/18/22 0748  WBC 14.3*   < > 12.6* 12.6* 11.9* 14.1* 12.4*  NEUTROABS 12.8*  --   --   --   --   --   --   HGB 11.5*   < > 11.4* 10.5* 11.1* 10.7* 10.9*  HCT 35.5*   < > 36.6* 33.6* 33.5* 32.8* 33.9*  MCV 89.6   < > 90.6 90.3 86.3 85.6 86.5  PLT 257   < > 251 249 274 302 288   < > = values in  this interval not displayed.    Blood Culture    Component Value Date/Time   SDES BLOOD RIGHT HAND 07/12/2022 1148   SPECREQUEST  07/12/2022 1148    BOTTLES DRAWN AEROBIC AND ANAEROBIC Blood Culture adequate volume   CULT (A) 07/12/2022 1148    ESCHERICHIA COLI Confirmed Extended Spectrum Beta-Lactamase Producer (ESBL).  In bloodstream infections from ESBL organisms, carbapenems are preferred over piperacillin/tazobactam. They are shown to have a lower risk of mortality.    REPTSTATUS 07/17/2022 FINAL 07/12/2022 1148    Cardiac Enzymes: No results for input(s): "CKTOTAL", "CKMB", "CKMBINDEX", "TROPONINI" in the last 168 hours. CBG: Recent Labs  Lab 07/17/22 0250 07/17/22 0750 07/17/22 1544 07/17/22 2146 07/18/22 0806  GLUCAP 129* 131* 97 114* 134*    Iron Studies: No results for input(s): "IRON", "TIBC", "TRANSFERRIN", "FERRITIN" in the last 72 hours. @lablastinr3$ @ Studies/Results: IR Fluoro Guide CV Line Right  Result Date: 07/16/2022 INDICATION: 82 year old male referred for temporary hemodialysis catheter EXAM:  IMAGE GUIDED TEMPORARY HEMODIALYSIS CATHETER MEDICATIONS: NONE ANESTHESIA/SEDATION: NONE FLUOROSCOPY: Radiation Exposure Index (as provided by the fluoroscopic device): 0 mGy Kerma COMPLICATIONS: None PROCEDURE: Informed written consent was obtained from the patient's family after a discussion of the risks, benefits, and alternatives to treatment. Questions regarding the procedure were encouraged and answered. The right neck was prepped with chlorhexidine in a sterile fashion, and a sterile drape was applied covering the operative field. Maximum barrier sterile technique with sterile gowns and gloves were used for the procedure. A timeout was performed prior to the initiation of the procedure. A micropuncture kit was utilized to access the right internal jugular vein under direct, real-time ultrasound guidance after the overlying soft tissues were anesthetized with 1%  lidocaine with epinephrine. Ultrasound image documentation was performed. The microwire was kinked to measure appropriate catheter length. A stiff glidewire was advanced to the level of the IVC. A 16 cm hemodialysis catheter was then placed over the wire. Final catheter positioning was confirmed and documented with a spot radiographic image. The catheter aspirates and flushes normally. The catheter was flushed with appropriate volume heparin dwells. Dressings were applied. The patient tolerated the procedure well without immediate post procedural complication. IMPRESSION: Status post right IJ temporary hemodialysis catheter Signed, Dulcy Fanny. Nadene Rubins, RPVI [facility] Electronically Signed   By: Corrie Mckusick D.O.   On: 07/16/2022 14:00   IR US Guide Vasc Access Right  Result Date: 07/16/2022 INDICATION: 82 year old male referred for temporary hemodialysis catheter EXAM: IMAGE GUIDED TEMPORARY HEMODIALYSIS CATHETER MEDICATIONS: NONE ANESTHESIA/SEDATION: NONE FLUOROSCOPY: Radiation Exposure Index (as provided by the fluoroscopic device): 0 mGy Kerma COMPLICATIONS: None PROCEDURE: Informed written consent was obtained from the patient's family after a discussion of the risks, benefits, and alternatives to treatment. Questions regarding the procedure were encouraged and answered. The right neck was prepped with chlorhexidine in a sterile fashion, and a sterile drape was applied covering the operative field. Maximum barrier sterile technique with sterile gowns and gloves were used for the procedure. A timeout was performed prior to the initiation of the procedure. A micropuncture kit was utilized to access the right internal jugular vein under direct, real-time ultrasound guidance after the overlying soft tissues were anesthetized with 1% lidocaine with epinephrine. Ultrasound image documentation was performed. The microwire was kinked to measure  appropriate catheter length. A stiff glidewire was advanced to the level of the IVC. A 16 cm hemodialysis catheter was then placed over the wire. Final catheter positioning was confirmed and documented with a spot radiographic image. The catheter aspirates and flushes normally. The catheter was flushed with appropriate volume heparin dwells. Dressings were applied. The patient tolerated the procedure well without immediate post procedural complication. IMPRESSION: Status post right IJ temporary hemodialysis catheter Signed, Dulcy Fanny. Nadene Rubins, RPVI [facility] Inter[facility] Rad[facility] Special[facility] Electronically Signed   By: Corrie Mckusick D.O.   On: 07/16/2022 14:00   Medications:  meropenem (MERREM) IV 1 g (07/18/22 0937)    atorvastatin  80 mg Oral QHS   busPIRone  5 mg Oral TID   Chlorhexidine Gluconate Cloth  6 each Topical Daily   Chlorhexidine Gluconate Cloth  6 each Topical Q0600   famotidine  20 mg Oral Daily   heparin  5,000 Units Subcutaneous Q8H   insulin aspart  0-9 Units Subcutaneous TID WC   metoprolol succinate  12.5 mg Oral Daily   Assessment/Plan **Sepsis secondary to UTI: on presentation.  Appears to  be improving on antibiotic therapy and foley s/p change out.    **AKI on CKD 3:  Baseline kidney function as of 04/2022 in the 1.59m/dL range.  Now with severe, nonoliguric AKI.  Suspect multifactorial with contributions of sepsis/hypoperfusion, pyelonephritis, hypovolemia, obstruction.  In setting of poor po intake gave fluid challenge 2/7 but unfortunately no improvement in kidney function and BUN in 140s with mental status worse than baseline.    I had a long talk with daughter HChauncey Reading as not improving with supportive can offer a time limited trial of dialysis (1 week) to see if with additional time kidneys recover.  If not I would recommend hospice - give comorbids and frailty not a long term HD candidate.  She d/w brother and they are agreeable.  Had HD  #1 on 2/9, will have #2 2/10, #3 2/12.  AM labs not collected - re ordered for now as well as tomorrow AM for comparison/assessment of renal function.  Continue supportive care -- maintaining euvolemia, avoiding nephrotoxins and hypotension.   **HFrEF:   2022 EF 20-25%, judicious use of fluids PRN - none currently indicated.  Med mgmt limited by GFR currently.    **metabolic acidosis:  on oral bicarb   **Anemia:  Hb 11s, follow.   **Dementia: SNF resident - seems to be a off usual baseline according to daughter; certainly with a BUN 140s could be contributing to AMS. Dialysis as above.  Unfortunately I don't observe an appreciable improvement after 2 HD sessions.   Will follow - reach out with concerns.     LJannifer HickMD 07/18/2022, 11:25 AM  [facility] Pager: [phone]

## 2022-07-18 NOTE — Progress Notes (Signed)
   Palliative Medicine Inpatient Follow Up Note HPI:  82 y.o. male admitted 07/12/22 from [facility] SNF with decreased urine output, AKI, UTI and pyelonephritis. PMHx; dementia, hearing impairment, HFrEF 20%, ICM, HTN, HLD, T2DM, goiter, hyperthyroidism, gout, chronic foley, tobacco use.    Palliative care has been asked to get involved in the setting of progressive kidney failure to further address goals of care.   Today's Discussion 07/18/2022  *Please note that this is a verbal dictation therefore any spelling or grammatical errors are due to the "[place]" system interpretation.  Chart reviewed inclusive of vital signs, progress notes, laboratory results, and diagnostic images. Patient is eating little to nothing.   I met with [name] this morning. He remains oriented to self though does not know he is in a hospital or why he is here. Provided reorientation.   I called patients daughter, [name]. She shares that she saw [facility] yesterday but he was sleeping and not greatly arousable. He was per her report similar the day prior. She has not seen great improvements since initiation on dialysis.   I shared that I worry about this as well. IN addition patients oral intake is not very good. I shared while we hope things improve it may very well end up that we need to travel down the road of hospice. Patients daughter requested a medical update from the primary team which I shared I would inform them of.   Questions and concerns addressed/Palliative Support Provided.   Objective Assessment: Vital Signs Vitals:   07/18/22 0930 07/18/22 1200  BP: (!) 124/58 120/61  Pulse:  97  Resp: (!) 23 18  Temp: 98 F (36.7 C)   SpO2:  95%    Intake/Output Summary (Last 24 hours) at 07/18/2022 1338 Last data filed at 07/18/2022 0522 Gross per 24 hour  Intake 300 ml  Output 1200 ml  Net -900 ml    Last Weight  Most recent update: 07/17/2022  6:08 AM    Weight  58.7 kg (129 lb 6.6  oz)            Gen: Frail elderly AA M in NAD HEENT: moist mucous membranes CV: Irregular rate and regular rhythm  PULM:  On RA, breathing is even and nonlabored ABD: soft/nontender  EXT: No edema  Neuro: Pleasantly confused  SUMMARY OF RECOMMENDATIONS   FULL CODE --> For the time being   Plan to allow time for outcomes   Have obtained a copy of AD's for EMR   Patients daughter is aware of the likely possibility of hospice if no improvement after a one week trial of dialysis --> Patient thus far (s/p 2 sessions) is not thriving  Delirium precautions  Have requested a nutrition consult for adult FTT  Patients family has requested an update on patient status   Ongoing support  Billing based on MDM: High ______________________________________________________________________________________ [place] Team Team Cell Phone: [phone] Please utilize secure chat with additional questions, if there is no response within 30 minutes please call the above phone number  Palliative Medicine Team providers are available by phone from 7am to 7pm daily and can be reached through the team cell phone.  Should this patient require assistance outside of these hours, please call the patient's attending physician.

## 2022-07-18 NOTE — Progress Notes (Signed)
     Daily Progress Note Intern Pager: [phone]  Patient name: [name] Medical record number: [id number] Date of birth: 07/19/1940 Age: 82 y.o. Gender: male  Primary Care Provider: [name], DO Consultants: nephro, palliative, ID  Code Status: Full  Pt Overview and Major Events to Date:  2/5: admitted 2/7: nephrology consulted 2/8: palliative consulted; Blood cx growing ESBL 2/9: ID consulted; temp HD catheter placed, 1st HD session  Assessment and Plan: [name] is an 82 y.o. male admitted for sepsis likely secondary to urinary source and acute renal failure. Pertinent PMH/PSH includes chronic indwelling foley, dementia, T2DM, HFrEF (EF 20-25%), HTN, tobacco use, goiter.   * Bacteremia due to Escherichia coli Bcx grew ESBL, most likely from urinary source.  - ID following, appreciate recommendations - f/u Bcx sensitivities - Continue Meropenem for 7 day course (2/8- ) - Tylenol prn for fever - AM CBC  AKI (acute kidney injury) ([place]) Continues to produce urine. Had HD 2/9, plan for 1 wk trial to see if renal function improves. - Npehrology consulted, appreciate recs - 1 wk trial of HD to see if renal function recovers. Not planning for long-term dialysis. - Indwelling foley - Strict I/O's - Daily RFP - Avoid nephrotoxic agents  Encephalopathy A&O to self, more alert and bright demeanor this AM.  - tx renal failure as above - Fall and delirium precautions - Consider discontinuing buspar. Unclear why patient on this at home.  Chronic HFrEF (heart failure with reduced ejection fraction) ([place]) Echo 2022 showed EF 20-25%. - Cont home Toprol 12.'5mg'$  daily - Hold Lasix and Jardiance given AKI  Goals of care, counseling/discussion Currently Full code. Daughter understands that dialysis is only for short-term.  - Palliative consulted, appreciate recs - Daughter [name] is main HCPOA. Son Grason, is secondary contact. There is another son that is not  involved.   T2DM (type 2 diabetes mellitus) ([place]) - Cont sSSI - Holding home Jardiance - CBG monitoring qAC and qHS    FEN/GI: Dysphagia diet PPx: SQ Hep Dispo:SNF pending clinical improvement . Barriers include need for dialysis.   Subjective:  Alert and appears comfortable this AM.  Objective: Temp:  [97.6 F (36.4 C)-99.1 F (37.3 C)] 98 F (36.7 C) (02/11 0930) Pulse Rate:  [77-100] 97 (02/11 1200) Resp:  [16-23] 18 (02/11 1200) BP: (120-138)/(49-96) 120/61 (02/11 1200) SpO2:  [93 %-100 %] 95 % (02/11 1200) Physical Exam: General: Chronically-ill appearing old man, pleasantly demented. NAD. Cardiovascular: RRR Respiratory: CTAB. Normal WOB on RA Abdomen: Soft, nontender, nondistended. Normal BS.  Laboratory: Most recent CBC Lab Results  Component Value Date   WBC 12.4 (H) 07/18/2022   HGB 10.9 (L) 07/18/2022   HCT 33.9 (L) 07/18/2022   MCV 86.5 07/18/2022   PLT 288 07/18/2022   Most recent BMP    Latest Ref Rng & Units 07/18/2022    7:42 AM  BMP  Glucose 70 - 99 mg/dL 139   BUN 8 - 23 mg/dL 66   Creatinine 0.61 - 1.24 mg/dL 3.84   Sodium 135 - 145 mmol/L 137   Potassium 3.5 - 5.1 mmol/L 4.8   Chloride 98 - 111 mmol/L 102   CO2 22 - 32 mmol/L 25   Calcium 8.9 - 10.3 mg/dL 8.7    Arlyce Dice, MD 07/18/2022, 1:19 PM  PGY-1, [place] Intern pager: [phone], text pages welcome Secure chat group [place]

## 2022-07-19 DIAGNOSIS — B9620 Unspecified Escherichia coli [E. coli] as the cause of diseases classified elsewhere: Secondary | ICD-10-CM | POA: Diagnosis not present

## 2022-07-19 DIAGNOSIS — R7881 Bacteremia: Secondary | ICD-10-CM | POA: Diagnosis not present

## 2022-07-19 LAB — CBC
HCT: 33.1 % — ABNORMAL LOW (ref 39.0–52.0)
Hemoglobin: 10.4 g/dL — ABNORMAL LOW (ref 13.0–17.0)
MCH: 27.6 pg (ref 26.0–34.0)
MCHC: 31.4 g/dL (ref 30.0–36.0)
MCV: 87.8 fL (ref 80.0–100.0)
Platelets: 282 10*3/uL (ref 150–400)
RBC: 3.77 MIL/uL — ABNORMAL LOW (ref 4.22–5.81)
RDW: 14.9 % (ref 11.5–15.5)
WBC: 12.6 10*3/uL — ABNORMAL HIGH (ref 4.0–10.5)
nRBC: 0 % (ref 0.0–0.2)

## 2022-07-19 LAB — RENAL FUNCTION PANEL
Albumin: 1.7 g/dL — ABNORMAL LOW (ref 3.5–5.0)
Anion gap: 12 (ref 5–15)
BUN: 76 mg/dL — ABNORMAL HIGH (ref 8–23)
CO2: 24 mmol/L (ref 22–32)
Calcium: 8.9 mg/dL (ref 8.9–10.3)
Chloride: 104 mmol/L (ref 98–111)
Creatinine, Ser: 4.23 mg/dL — ABNORMAL HIGH (ref 0.61–1.24)
GFR, Estimated: 13 mL/min — ABNORMAL LOW (ref >60)
Glucose, Bld: 179 mg/dL — ABNORMAL HIGH (ref 70–99)
Phosphorus: 6.2 mg/dL — ABNORMAL HIGH (ref 2.5–4.6)
Potassium: 4.2 mmol/L (ref 3.5–5.1)
Sodium: 140 mmol/L (ref 135–145)

## 2022-07-19 LAB — GLUCOSE, CAPILLARY
Glucose-Capillary: 150 mg/dL — ABNORMAL HIGH (ref 70–99)
Glucose-Capillary: 158 mg/dL — ABNORMAL HIGH (ref 70–99)
Glucose-Capillary: 86 mg/dL (ref 70–99)

## 2022-07-19 MED ORDER — HEPARIN SODIUM (PORCINE) 1000 UNIT/ML IJ SOLN: 2600.0000 [IU] | Freq: Once | INTRAMUSCULAR | Status: DC

## 2022-07-19 MED ORDER — ENSURE ENLIVE PO LIQD
237.0000 mL | Freq: Two times a day (BID) | ORAL | Status: DC
  Administered 2022-07-19 – 2022-07-26 (×14): 237 mL via ORAL

## 2022-07-19 MED ORDER — SODIUM CHLORIDE 0.9 % IV SOLN
500.0000 mg | INTRAVENOUS | Status: AC
  Administered 2022-07-20 – 2022-07-21 (×2): 500 mg via INTRAVENOUS
  Filled 2022-07-19 (×2): qty 10

## 2022-07-19 MED ORDER — HEPARIN SODIUM (PORCINE) 1000 UNIT/ML IJ SOLN
INTRAMUSCULAR | Status: AC
  Filled 2022-07-19: qty 4

## 2022-07-19 NOTE — Progress Notes (Signed)
     Daily Progress Note Intern Pager: [phone]  Patient name: [name] Medical record number: [id number] Date of birth: Nov 05, 1940 Age: 82 y.o. Gender: male  Primary Care Provider: [name], DO Consultants: Palliative, Nephrology Code Status: Full  Pt Overview and Major Events to Date:  2/5: admitted 2/8: Blood cx growing ESBL 2/9: Started 1wk trial of dialysis  Assessment and Plan: [name] is an 82 y.o. male admitted for sepsis likely secondary to urinary source and acute renal failure. Pertinent PMH/PSH includes chronic indwelling foley, dementia, T2DM, HFrEF (EF 20-25%), HTN, tobacco use, goiter.   * Bacteremia due to Escherichia coli Bcx grew ESBL, most likely from urinary source.  - ID consulted, appreciate recs - Continue Meropenem for 7 day course (2/8- ) - Tylenol prn for fever - AM CBC  AKI (acute kidney injury) ([place]) 1 wk trial of HD started 2/9. 1.8L UOP yesterday. - Npehrology consulted, appreciate recs - 1 wk trial of HD to see if renal function recovers. Not planning for long-term dialysis. - Indwelling foley - Strict I/O's - Daily RFP - Avoid nephrotoxic agents  Encephalopathy A&O to self and place today.  - tx renal failure as above - Fall and delirium precautions - Consider discontinuing buspar. Unclear why patient on this at home.  Chronic HFrEF (heart failure with reduced ejection fraction) ([place]) Echo 2022 showed EF 20-25%. - Cont home Toprol 12.'5mg'$  daily - Hold Lasix and Jardiance given AKI  Goals of care, counseling/discussion Currently Full code. Mental status has stayed stable despite starting HD. Will continue next session today. - Palliative consulted, appreciate recs - Daughter [name] is main HCPOA. Son [place], is secondary contact.  T2DM (type 2 diabetes mellitus) ([place]) - Cont sSSI - Holding home Jardiance - CBG monitoring qAC and qHS   FEN/GI: Dysphagia PPx: SQ Hep Dispo:Pending PT recommendations  pending  clinical improvement . Barriers include trial of short-term dialysis for renal failure.   Subjective:  NAEO.   Objective: Temp:  [97.4 F (36.3 C)-98.6 F (37 C)] 97.4 F (36.3 C) (02/12 0850) Pulse Rate:  [70-97] 70 (02/12 0900) Resp:  [16-29] 16 (02/12 0900) BP: (108-124)/(39-65) 111/51 (02/12 0900) SpO2:  [92 %-100 %] 95 % (02/12 0900) Physical Exam: General: Alert and oriented to self and place ("hospital"). Laying comfortably in bed. NAD. Cardiovascular: RRR Respiratory: Normal WOB on RA. CTAB. Abdomen: Soft, nontender, nondistended.  Laboratory: Most recent CBC Lab Results  Component Value Date   WBC 12.6 (H) 07/19/2022   HGB 10.4 (L) 07/19/2022   HCT 33.1 (L) 07/19/2022   MCV 87.8 07/19/2022   PLT 282 07/19/2022   Most recent BMP    Latest Ref Rng & Units 07/19/2022    4:40 AM  BMP  Glucose 70 - 99 mg/dL 179   BUN 8 - 23 mg/dL 76   Creatinine 0.61 - 1.24 mg/dL 4.23   Sodium 135 - 145 mmol/L 140   Potassium 3.5 - 5.1 mmol/L 4.2   Chloride 98 - 111 mmol/L 104   CO2 22 - 32 mmol/L 24   Calcium 8.9 - 10.3 mg/dL 8.9    [name] Dice, MD 07/19/2022, 9:19 AM  PGY-1 , [place] Intern pager: [phone], text pages welcome Secure chat group [place]

## 2022-07-19 NOTE — Progress Notes (Signed)
   07/19/22 1208  Pain Assessment  Pain Scale 0-10  Pain Score 0  Hemodialysis Catheter Right Internal jugular Triple lumen Temporary (Non-Tunneled)  Placement Date/Time: 07/16/22 1339   Serial / Lot #: [id number]  Expiration Date: 04/06/27  Time Out: Correct patient;Correct site;Correct procedure  Maximum sterile barrier precautions: Hand hygiene;Cap;Mask;Sterile gown;Sterile gloves;Large sterile ...  Site Condition No complications  Blue Lumen Status Dead end cap in place  Red Lumen Status Dead end cap in place  Purple Lumen Status N/A  Catheter fill solution Heparin 1000 units/ml  Catheter fill volume (Arterial) 1.3 cc  Catheter fill volume (Venous) 1.3  Dressing Type Transparent  Dressing Status Antimicrobial disc in place;Clean, Dry, Intact  Interventions Other (Comment)  Drainage Description None  Dressing Change Due 07/08/22  Post treatment catheter status Capped and Clamped  Neurological  Level of Consciousness Alert  Orientation Level Oriented to place  RUE Motor Strength 4  LUE Motor Strength 4  RLE Motor Strength 3  LLE Motor Strength 3  Respiratory  Respiratory Pattern Regular;Unlabored  Chest Assessment Chest expansion symmetrical  Bilateral Breath Sounds Clear;Diminished  R Upper  Breath Sounds Clear  L Upper Breath Sounds Clear  R Lower Breath Sounds Clear  L Lower Breath Sounds Clear  Cough None  Cardiac  Pulse Regular  Heart Sounds S1, S2  Jugular Venous Distention (JVD) No  ECG Monitor Yes  Cardiac Rhythm NSR  Vascular  R Radial Pulse +2  L Radial Pulse +2  R Dorsalis Pedis Pulse +2   Received patient in bed to unit.  Alert and oriented.  Informed consent signed and in chart.   TX duration:2.5 hours  Patient tolerated well.  Transported back to the room  Alert, without acute distress.  Hand-off given to patient's nurse.   Access used: RIJ temporary cvc Access issues: no complications  Total UF removed: 0 Medication(s) given:  none PJennifer L [facility]

## 2022-07-19 NOTE — Progress Notes (Signed)
Initial Nutrition Assessment  DOCUMENTATION CODES:   Severe malnutrition in context of chronic illness, Underweight  INTERVENTION:   Recommend adding a bowel regimen since no BM in 7 days. Feeding assist with all meals Ensure Enlive po BID, each supplement provides 350 kcal and 20 grams of protein.  NUTRITION DIAGNOSIS:   Severe Malnutrition related to chronic illness (CHF, dementia) as evidenced by severe muscle depletion, severe fat depletion.  GOAL:   Patient will meet greater than or equal to 90% of their needs  MONITOR:   PO intake, Supplement acceptance, Labs, I & O's  REASON FOR ASSESSMENT:   Consult Assessment of nutrition requirement/status  ASSESSMENT:   82 y.o. male presented to the ED from SNF with AMS and urinary retention. PMH includes T2DM, CHF, HTN, tobacco use, malnutrition, dementia, and HLD. Pt admitted with sepsis, UTI, AKI, encephalopathy, and pyelonephritis.   2/05 - Admitted 2/06 - SLP evaluation, Dysphagia 1/thin  Pt laying in bed at time of RD visit. Does not really interact with RD, mumbles words when RD ask questions. Lunch tray sitting at bedside, when RD ask if pt wants lunch he closed his eyes.   Currently undergoing trial of HD to determine renal recovery.[place] with Palliative care team. Discussed case with RN. Reports that he took pills this morning, crushed in applesauce. Per chart, pt with no BM in 7 days, discussed with RN. RN reports no BM with her. Reached out to MD to start bowel regimen.    Medications reviewed and include: Pepcid, NovoLog SSI Labs reviewed: BUN 76, Creatinine 4.23, Phosphorus 6.2 (H), Hgb A1c 5.5%, 24 hr CBGs 150-193   UOP: 1850 mL x 24 hours  NUTRITION - FOCUSED PHYSICAL EXAM:  Flowsheet Row Most Recent Value  Orbital Region Severe depletion  Upper Arm Region Severe depletion  Thoracic and Lumbar Region Severe depletion  Buccal Region Severe depletion  Temple Region Severe depletion  Clavicle Bone Region  Severe depletion  Clavicle and Acromion Bone Region Severe depletion  Scapular Bone Region Severe depletion  Dorsal Hand Severe depletion  Patellar Region Severe depletion  Anterior Thigh Region Severe depletion  Posterior Calf Region Severe depletion  Edema (RD Assessment) None  Hair Reviewed  Eyes Unable to assess  Mouth Unable to assess  Skin Reviewed  Nails Reviewed   Diet Order:   Diet Order             DIET - DYS 1 Room service appropriate? No; Fluid consistency: Thin  Diet effective now                   EDUCATION NEEDS:   Not appropriate for education at this time  Skin:  Skin Assessment: Reviewed RN Assessment  Last BM:  2/5  Height:  Ht Readings from Last 1 Encounters:  07/12/22 6' (1.829 m)   Weight:  Wt Readings from Last 1 Encounters:  07/17/22 58.7 kg   Ideal Body Weight:  80.9 kg  BMI:  Body mass index is 17.55 kg/m.  Estimated Nutritional Needs:  Kcal:  1800-2000 Protein:  90-110 grams Fluid:  >/= 1.8 L   Hermina Barters RD, LDN Clinical Dietitian See [facility] for contact information.

## 2022-07-19 NOTE — Progress Notes (Signed)
[facility] Progress Note   Subjective: Patient planning for dialysis today.  Denies any specific complaints.  Objective Vitals:   07/19/22 0850 07/19/22 0900 07/19/22 0930 07/19/22 1000  BP: 124/65 (!) 111/51 (!) 108/59 103/62  Pulse: 70 70 68 74  Resp: 16 16 16 $ (!) 21  Temp: (!) 97.4 F (36.3 C)     TempSrc: Oral     SpO2: 95% 95% 98%   Weight:      Height:       Physical Exam Gen: cachectic frail  Eyes: anicteric no injection ENT: MMM Neck: no JVD CV: Normal rate Abd: soft, thin, nontender Lungs: Bilateral chest rise with no increased work of breathing Neuro: Awake and alert, minimally interactive  Additional Objective Labs: Basic Metabolic Panel: Recent Labs  Lab 07/17/22 0403 07/18/22 0742 07/19/22 0440  NA 140 137 140  K 4.0 4.8 4.2  CL 105 102 104  CO2 22 25 24  $ GLUCOSE 140* 139* 179*  BUN 86* 66* 76*  CREATININE 4.27* 3.84* 4.23*  CALCIUM 8.7* 8.7* 8.9  PHOS 4.6 6.1* 6.2*   Liver Function Tests: Recent Labs  Lab 07/13/22 0517 07/14/22 0640 07/15/22 0255 07/16/22 0521 07/17/22 0403 07/18/22 0742 07/19/22 0440  AST 122* 89* 64*  --   --   --   --   ALT 88* 78* 61*  --   --   --   --   ALKPHOS 69 71 60  --   --   --   --   BILITOT 0.5 0.6 0.8  --   --   --   --   PROT 6.9 6.8 6.7  --   --   --   --   ALBUMIN 1.9* 1.8* 1.6*   < > 1.7* 1.6* 1.7*   < > = values in this interval not displayed.   No results for input(s): "LIPASE", "AMYLASE" in the last 168 hours. CBC: Recent Labs  Lab 07/12/22 1115 07/13/22 0517 07/15/22 0255 07/16/22 0521 07/17/22 0403 07/18/22 0748 07/19/22 0440  WBC 14.3*   < > 12.6* 11.9* 14.1* 12.4* 12.6*  NEUTROABS 12.8*  --   --   --   --   --   --   HGB 11.5*   < > 10.5* 11.1* 10.7* 10.9* 10.4*  HCT 35.5*   < > 33.6* 33.5* 32.8* 33.9* 33.1*  MCV 89.6   < > 90.3 86.3 85.6 86.5 87.8  PLT 257   < > 249 274 302 288 282   < > = values in this interval not displayed.   Blood Culture    Component  Value Date/Time   SDES BLOOD RIGHT HAND 07/12/2022 1148   SPECREQUEST  07/12/2022 1148    BOTTLES DRAWN AEROBIC AND ANAEROBIC Blood Culture adequate volume   CULT (A) 07/12/2022 1148    ESCHERICHIA COLI Confirmed Extended Spectrum Beta-Lactamase Producer (ESBL).  In bloodstream infections from ESBL organisms, carbapenems are preferred over piperacillin/tazobactam. They are shown to have a lower risk of mortality.    REPTSTATUS 07/17/2022 FINAL 07/12/2022 1148    Cardiac Enzymes: No results for input(s): "CKTOTAL", "CKMB", "CKMBINDEX", "TROPONINI" in the last 168 hours. CBG: Recent Labs  Lab 07/18/22 0806 07/18/22 1130 07/18/22 1628 07/18/22 2215 07/19/22 0754  GLUCAP 134* 158* 190* 193* 150*   Iron Studies: No results for input(s): "IRON", "TIBC", "TRANSFERRIN", "FERRITIN" in the last 72 hours. @lablastinr3$ @ Studies/Results: No results found. Medications:  meropenem (MERREM) IV Stopped (07/18/22 1007)  atorvastatin  80 mg Oral QHS   busPIRone  5 mg Oral TID   Chlorhexidine Gluconate Cloth  6 each Topical Daily   Chlorhexidine Gluconate Cloth  6 each Topical Q0600   famotidine  20 mg Oral Daily   heparin  5,000 Units Subcutaneous Q8H   heparin sodium (porcine)  2,600 Units Intracatheter Once   insulin aspart  0-9 Units Subcutaneous TID WC   metoprolol succinate  12.5 mg Oral Daily   Assessment/Plan **Sepsis secondary to UTI: on presentation.  Appears to be improving on antibiotic therapy and foley s/p change out.    **AKI on CKD 3:  Baseline kidney function as of 04/2022 in the 1.34m/dL range.  Now with severe, nonoliguric AKI.  Suspect multifactorial with contributions of sepsis/hypoperfusion, pyelonephritis, hypovolemia, obstruction.  In setting of poor po intake gave fluid challenge 2/7 but unfortunately no improvement in kidney function and BUN in 140s with mental status worse than baseline.     Dr. KJohnney Ouhad a long talk with daughter HCPOA- as not improving with  supportive can offer a time limited trial of dialysis (1 week) to see if with additional time kidneys recover.  If not I would recommend hospice - give comorbids and frailty not a long term HD candidate.  She d/w brother and they are agreeable.  Had HD #1 on 2/9, will have #2 2/10, #3 today.  Has not seemed to improve.  No plans for continued dialysis after today unless goals change   **HFrEF:   2022 EF 20-25%, judicious use of fluids PRN - none currently indicated.  Med mgmt limited by GFR currently.    **metabolic acidosis:  on oral bicarb   **Anemia:  Hb 10s, follow.   **Dementia: SNF resident - seems to be a off usual baseline according to daughter; certainly with a BUN 140s could be contributing to AMS. Dialysis as above.  Unfortunately I don't observe an appreciable improvement after 2 HD sessions.   Will follow - reach out with concerns.

## 2022-07-20 DIAGNOSIS — B9620 Unspecified Escherichia coli [E. coli] as the cause of diseases classified elsewhere: Secondary | ICD-10-CM | POA: Diagnosis not present

## 2022-07-20 DIAGNOSIS — R7881 Bacteremia: Secondary | ICD-10-CM | POA: Diagnosis not present

## 2022-07-20 DIAGNOSIS — E43 Unspecified severe protein-calorie malnutrition: Secondary | ICD-10-CM | POA: Insufficient documentation

## 2022-07-20 LAB — GLUCOSE, CAPILLARY
Glucose-Capillary: 199 mg/dL — ABNORMAL HIGH (ref 70–99)
Glucose-Capillary: 246 mg/dL — ABNORMAL HIGH (ref 70–99)
Glucose-Capillary: 127 mg/dL — ABNORMAL HIGH (ref 70–99)
Glucose-Capillary: 139 mg/dL — ABNORMAL HIGH (ref 70–99)

## 2022-07-20 LAB — CBC
HCT: 32.5 % — ABNORMAL LOW (ref 39.0–52.0)
Hemoglobin: 10.8 g/dL — ABNORMAL LOW (ref 13.0–17.0)
MCH: 28.5 pg (ref 26.0–34.0)
MCHC: 33.2 g/dL (ref 30.0–36.0)
MCV: 85.8 fL (ref 80.0–100.0)
Platelets: 278 10*3/uL (ref 150–400)
RBC: 3.79 MIL/uL — ABNORMAL LOW (ref 4.22–5.81)
RDW: 15.1 % (ref 11.5–15.5)
WBC: 14.9 10*3/uL — ABNORMAL HIGH (ref 4.0–10.5)
nRBC: 0 % (ref 0.0–0.2)

## 2022-07-20 LAB — RENAL FUNCTION PANEL
Albumin: 1.8 g/dL — ABNORMAL LOW (ref 3.5–5.0)
Anion gap: 11 (ref 5–15)
BUN: 45 mg/dL — ABNORMAL HIGH (ref 8–23)
CO2: 25 mmol/L (ref 22–32)
Calcium: 9 mg/dL (ref 8.9–10.3)
Chloride: 99 mmol/L (ref 98–111)
Creatinine, Ser: 3.23 mg/dL — ABNORMAL HIGH (ref 0.61–1.24)
GFR, Estimated: 18 mL/min — ABNORMAL LOW (ref >60)
Glucose, Bld: 174 mg/dL — ABNORMAL HIGH (ref 70–99)
Phosphorus: 5.4 mg/dL — ABNORMAL HIGH (ref 2.5–4.6)
Potassium: 3.9 mmol/L (ref 3.5–5.1)
Sodium: 135 mmol/L (ref 135–145)

## 2022-07-20 MED ORDER — POLYETHYLENE GLYCOL 3350 17 G PO PACK
17.0000 g | PACK | Freq: Every day | ORAL | Status: DC
  Administered 2022-07-20 – 2022-07-24 (×5): 17 g via ORAL
  Filled 2022-07-20 (×5): qty 1

## 2022-07-20 MED ORDER — SODIUM CHLORIDE 0.9% FLUSH
10.0000 mL | Freq: Two times a day (BID) | INTRAVENOUS | Status: DC
  Administered 2022-07-20: 30 mL
  Administered 2022-07-21 – 2022-07-23 (×4): 10 mL

## 2022-07-20 MED ORDER — SODIUM CHLORIDE 0.9% FLUSH: 10.0000 mL | INTRAVENOUS | Status: DC | PRN

## 2022-07-20 NOTE — Progress Notes (Signed)
     Daily Progress Note Intern Pager: [phone]  Patient name: [name] Medical record number: [id number] Date of birth: 1941/04/24 Age: 82 y.o. Gender: male  Primary Care Provider: [name], DO Consultants: Palliative, Neprhology, ID Code Status: Full  Pt Overview and Major Events to Date:  2/5 Admitted  2/8: Blood cx growing ESBL 2/9: Started 1wk trial of dialysis  Assessment and Plan: [name] is an 82 y.o. male admitted for sepsis likely secondary to urinary source and acute renal failure. Pertinent PMH/PSH includes chronic indwelling foley, dementia, T2DM, HFrEF (EF 20-25%), HTN, tobacco use, goiter.    * Bacteremia due to Escherichia coli Bcx grew ESBL, most likely from urinary source.  - ID consulted, appreciate recs - Continue Meropenem for 7 day course (2/8-2/14). Last dose tomorrow - Tylenol prn for fever - AM CBC  AKI (acute kidney injury) ([place]) Minimal/no change in mentation w/ short-term dialysis. Discussed w/ Nephrology and do not feel that continuing HD will provide further benefit at this time. Will likely need transition to hospice care given ARF. - No further plans for dialysis at this time - Npehrology consulted, appreciate recs - Indwelling foley - Strict I/O's - Daily RFP - Avoid nephrotoxic agents  Encephalopathy A&O to self today.  - Fall and delirium precautions   Chronic HFrEF (heart failure with reduced ejection fraction) ([place]) Echo 2022 showed EF 20-25%. - Cont home Toprol 12.'5mg'$  daily - Hold Lasix and Jardiance given AKI  Goals of care, counseling/discussion Currently Full code. Limited benefit w/ HD, no further plans for HD sessions at this time. - Palliative consulted, appreciate recs - Daughter [name] is main HCPOA. Son [name], is secondary contact.  T2DM (type 2 diabetes mellitus) ([place]) - Cont sSSI - Holding home Jardiance - CBG monitoring qAC and qHS   FEN/GI: dysphagia diet PPx: SQ hep Dispo: Pending  GOC  Subjective:  Had agitation last night and didn't take some night meds. NAD this AM.  Objective: Temp:  [97.6 F (36.4 C)-99.9 F (37.7 C)] 98 F (36.7 C) (02/13 1133) Pulse Rate:  [81-100] 82 (02/13 1133) Resp:  [15-20] 16 (02/13 1133) BP: (98-137)/(64-80) 137/73 (02/13 1133) SpO2:  [94 %-98 %] 96 % (02/13 1133) Weight:  [52.8 kg] 52.8 kg (02/13 0500) Physical Exam: General: Alert, oriented to self, not to place. Laying in bed comfortably. NAD. Cardiovascular: RRR Respiratory: CTAB. Normal WOB on RA. Abdomen: Soft, nontender, nondistended.   Laboratory: Most recent CBC Lab Results  Component Value Date   WBC 14.9 (H) 07/20/2022   HGB 10.8 (L) 07/20/2022   HCT 32.5 (L) 07/20/2022   MCV 85.8 07/20/2022   PLT 278 07/20/2022   Most recent BMP    Latest Ref Rng & Units 07/20/2022    3:18 AM  BMP  Glucose 70 - 99 mg/dL 174   BUN 8 - 23 mg/dL 45   Creatinine 0.61 - 1.24 mg/dL 3.23   Sodium 135 - 145 mmol/L 135   Potassium 3.5 - 5.1 mmol/L 3.9   Chloride 98 - 111 mmol/L 99   CO2 22 - 32 mmol/L 25   Calcium 8.9 - 10.3 mg/dL 9.0    Arlyce Dice, MD 07/20/2022, 1:37 PM  PGY-1, [place] Intern pager: [phone], text pages welcome Secure chat group [place]

## 2022-07-20 NOTE — TOC Progression Note (Signed)
Transition of Care [facility]) - Progression Note    Patient Details  Name: [name] MRN: [id number] Date of Birth: [birth year]  Transition of Care [facility]) CM/SW [place], LCSW Phone Number: 07/20/2022, 9:25 AM  Clinical Narrative:    CSW continuing to follow.        Expected Discharge Plan and Services                                               Social Determinants of Health (SDOH) Interventions SDOH Screenings   Food Insecurity: No Food Insecurity (07/12/2022)  Housing: Low Risk  (07/12/2022)  Transportation Needs: No Transportation Needs (07/12/2022)  Utilities: Not At Risk (07/12/2022)  Alcohol Screen: Low Risk  (04/27/2022)  Depression (PHQ2-9): Low Risk  (04/27/2022)  Financial Resource Strain: Low Risk  (04/27/2022)  Physical Activity: Inactive (04/27/2022)  Social Connections: Moderately Isolated (04/27/2022)  Stress: No Stress Concern Present (04/27/2022)  Tobacco Use: High Risk (07/16/2022)    Readmission Risk Interventions     No data to display

## 2022-07-20 NOTE — Progress Notes (Signed)
[facility] Progress Note   Subjective: Patient tolerated dialysis yesterday with no issues.  No complaints today.  No obvious changes in his mental status  Objective Vitals:   07/20/22 0400 07/20/22 0440 07/20/22 0500 07/20/22 0737  BP: 120/64   102/80  Pulse: 84   81  Resp: 17   16  Temp:  97.9 F (36.6 C)  97.7 F (36.5 C)  TempSrc:  Axillary  Oral  SpO2:    94%  Weight:   52.8 kg   Height:       Physical Exam Gen: cachectic frail  Eyes: anicteric no injection ENT: MMM Neck: no JVD CV: Normal rate Abd: soft, thin, nontender Lungs: Bilateral chest rise with no increased work of breathing Neuro: Awake and alert, minimally interactive  Additional Objective Labs: Basic Metabolic Panel: Recent Labs  Lab 07/18/22 0742 07/19/22 0440 07/20/22 0318  NA 137 140 135  K 4.8 4.2 3.9  CL 102 104 99  CO2 25 24 25  $ GLUCOSE 139* 179* 174*  BUN 66* 76* 45*  CREATININE 3.84* 4.23* 3.23*  CALCIUM 8.7* 8.9 9.0  PHOS 6.1* 6.2* 5.4*   Liver Function Tests: Recent Labs  Lab 07/14/22 0640 07/15/22 0255 07/16/22 0521 07/18/22 0742 07/19/22 0440 07/20/22 0318  AST 89* 64*  --   --   --   --   ALT 78* 61*  --   --   --   --   ALKPHOS 71 60  --   --   --   --   BILITOT 0.6 0.8  --   --   --   --   PROT 6.8 6.7  --   --   --   --   ALBUMIN 1.8* 1.6*   < > 1.6* 1.7* 1.8*   < > = values in this interval not displayed.   No results for input(s): "LIPASE", "AMYLASE" in the last 168 hours. CBC: Recent Labs  Lab 07/16/22 0521 07/17/22 0403 07/18/22 0748 07/19/22 0440 07/20/22 0318  WBC 11.9* 14.1* 12.4* 12.6* 14.9*  HGB 11.1* 10.7* 10.9* 10.4* 10.8*  HCT 33.5* 32.8* 33.9* 33.1* 32.5*  MCV 86.3 85.6 86.5 87.8 85.8  PLT 274 302 288 282 278   Blood Culture    Component Value Date/Time   SDES BLOOD RIGHT HAND 07/12/2022 1148   SPECREQUEST  07/12/2022 1148    BOTTLES DRAWN AEROBIC AND ANAEROBIC Blood Culture adequate volume   CULT (A) 07/12/2022 1148     ESCHERICHIA COLI Confirmed Extended Spectrum Beta-Lactamase Producer (ESBL).  In bloodstream infections from ESBL organisms, carbapenems are preferred over piperacillin/tazobactam. They are shown to have a lower risk of mortality.    REPTSTATUS 07/17/2022 FINAL 07/12/2022 1148    Cardiac Enzymes: No results for input(s): "CKTOTAL", "CKMB", "CKMBINDEX", "TROPONINI" in the last 168 hours. CBG: Recent Labs  Lab 07/18/22 2215 07/19/22 0754 07/19/22 1308 07/19/22 1613 07/20/22 0738  GLUCAP 193* 150* 158* 86 199*   Iron Studies: No results for input(s): "IRON", "TIBC", "TRANSFERRIN", "FERRITIN" in the last 72 hours. @lablastinr3$ @ Studies/Results: No results found. Medications:  meropenem (MERREM) IV 500 mg (07/20/22 [id number])    atorvastatin  80 mg Oral QHS   busPIRone  5 mg Oral TID   Chlorhexidine Gluconate Cloth  6 each Topical Daily   Chlorhexidine Gluconate Cloth  6 each Topical Q0600   famotidine  20 mg Oral Daily   feeding supplement  237 mL Oral BID BM   heparin  5,000 Units Subcutaneous  Q8H   insulin aspart  0-9 Units Subcutaneous TID WC   metoprolol succinate  12.5 mg Oral Daily   polyethylene glycol  17 g Oral Daily   Assessment/Plan **Sepsis secondary to UTI: on presentation.  Appears to be improving on antibiotic therapy and foley s/p change out.    **AKI on CKD 3:  Baseline kidney function as of 04/2022 in the 1.75m/dL range.  Now with severe, nonoliguric AKI.  Suspect multifactorial with contributions of sepsis/hypoperfusion, pyelonephritis, hypovolemia, obstruction.  In setting of poor po intake gave fluid challenge 2/7 but unfortunately no improvement in kidney function and BUN in 140s with mental status worse than baseline.     Dr. KJohnney Ouhad a long talk with daughter HCPOA- as not improving with supportive can offer a time limited trial of dialysis (1 week) to see if with additional time kidneys recover.  If not I would recommend hospice - give comorbids and frailty  not a long term HD candidate.  She d/w brother and they are agreeable.  Completed 3 session of dialysis without significant changes in his mental status.  At this point I do not think further dialysis would be beneficial.  It is still possible the patient could recover some kidney function but hard to say.   **HFrEF:   2022 EF 20-25%, judicious use of fluids PRN - none currently indicated.  Med mgmt limited by GFR currently.    **metabolic acidosis:  on oral bicarb   **Anemia:  Hb 10s, follow.   **Dementia: SNF resident - seems to be a off usual baseline according to daughter; certainly with a BUN 140s could be contributing to AMS.  Unfortunately no/minimal improvement with dialysis   Will follow - reach out with concerns.

## 2022-07-20 NOTE — Progress Notes (Signed)
Physical Therapy Treatment Patient Details Name: [name] MRN: [id number] DOB: 19-Aug-1940 Today's Date: 07/20/2022   History of Present Illness 82 y.o. male admitted 07/12/22 from [facility] SNF with decreased urine output, AKI, UTI and pyelonephritis. PMHx; dementia, hearing impairment, HFrEF 20%, ICM, HTN, HLD, T2DM, goiter, hyperthyroidism, gout, chronic foley, tobacco use.    PT Comments    Pt presents today with confusion, inconsistently following commands with increased time to participate. Pt requiring modA for bed mobility today, maintaining sitting EOB with minA and posterior lean with 1UE support, intermittent 2. Pt participating in reaching for anterior targets to facilitate trunk and hip flexion and decrease posterior lean, as well as progress trunk control, increased verbal and visual cues provided for pt to stay on task. Pt tolerating ~8 minutes seated EOB before requesting to return to supine, declining any attempts at standing or transferring today. Pt will continue to benefit from skilled acute PT at this time, current discharge plan remains appropriate.    Recommendations for follow up therapy are one component of a multi-disciplinary discharge planning process, led by the attending physician.  Recommendations may be updated based on patient status, additional functional criteria and insurance authorization.  Follow Up Recommendations  Long-term institutional care without follow-up therapy Can patient physically be transported by private vehicle: No   Assistance Recommended at Discharge Frequent or constant Supervision/Assistance  Patient can return home with the following A lot of help with walking and/or transfers;A lot of help with bathing/dressing/bathroom;Assistance with cooking/housework;Direct supervision/assist for medications management;Assist for transportation;Assistance with feeding;Direct supervision/assist for financial management   Equipment  Recommendations  None recommended by PT    Recommendations for Other Services       Precautions / Restrictions Precautions Precautions: Fall;Other (comment) Precaution Comments: Sub Q IV line supra pubic Restrictions Weight Bearing Restrictions: No     Mobility  Bed Mobility Overal bed mobility: Needs Assistance Bed Mobility: Supine to Sit, Sit to Supine     Supine to sit: Mod assist, HOB elevated Sit to supine: Mod assist, HOB elevated   General bed mobility comments: assist for BLE management and trunk support, utilizing HHA to pull into sitting, trunk management needed for return to supine    Transfers                   General transfer comment: pt declining attempts at standing or transferring today with encouragement provided, pt attempting to return to supine x1 trial and assisted back to EOB    Ambulation/Gait               General Gait Details: unable   Stairs             Wheelchair Mobility    Modified Rankin (Stroke Patients Only)       Balance Overall balance assessment: Needs assistance Sitting-balance support: Feet supported, Single extremity supported Sitting balance-Leahy Scale: Poor Sitting balance - Comments: minA provided EOB to maintain balance, varying from 1UE support to 2 Postural control: Posterior lean                                  Cognition Arousal/Alertness: Awake/alert Behavior During Therapy: Flat affect Overall Cognitive Status: Impaired/Different from baseline Area of Impairment: Attention, Memory, Following commands, Safety/judgement, Awareness, Problem solving                   Current Attention Level: Sustained Memory: Decreased short-term  memory Following Commands: Follows one step commands inconsistently, Follows one step commands with increased time Safety/Judgement: Decreased awareness of safety, Decreased awareness of deficits Awareness: Intellectual Problem Solving: Slow  processing, Decreased initiation, Difficulty sequencing, Requires verbal cues General Comments: pt with increased time for command following with verbal, tactile, and visual cues provided, inconsistently following. Pt talking throughout session but unable to understand or follow        Exercises Other Exercises Other Exercises: reaching to changing targets, x10 reps with BUE    General Comments General comments (skin integrity, edema, etc.): VSS on room air      Pertinent Vitals/Pain Pain Assessment Pain Assessment: Faces Faces Pain Scale: No hurt    Home Living                          Prior Function            PT Goals (current goals can now be found in the care plan section) Acute Rehab PT Goals Patient Stated Goal: none stated today PT Goal Formulation: Patient unable to participate in goal setting Time For Goal Achievement: 07/29/22 Potential to Achieve Goals: Fair Progress towards PT goals: Progressing toward goals    Frequency    Min 2X/week      PT Plan Current plan remains appropriate    Co-evaluation              AM-PAC PT "6 Clicks" Mobility   Outcome Measure  Help needed turning from your back to your side while in a flat bed without using bedrails?: A Little Help needed moving from lying on your back to sitting on the side of a flat bed without using bedrails?: A Lot Help needed moving to and from a bed to a chair (including a wheelchair)?: A Lot Help needed standing up from a chair using your arms (e.g., wheelchair or bedside chair)?: A Lot Help needed to walk in hospital room?: Total Help needed climbing 3-5 steps with a railing? : Total 6 Click Score: 11    End of Session   Activity Tolerance: Patient tolerated treatment well Patient left: in bed;with call bell/phone within reach;with bed alarm set Nurse Communication: Mobility status PT Visit Diagnosis: Other abnormalities of gait and mobility (R26.89);Muscle weakness  (generalized) (M62.81)     Time: [id number] PT Time Calculation (min) (ACUTE ONLY): 16 min  Charges:  $Therapeutic Activity: 8-22 mins                     Charlynne Cousins, PT DPT Acute Rehabilitation Services Office [phone]    Luvenia Heller 07/20/2022, 4:20 PM

## 2022-07-21 DIAGNOSIS — E43 Unspecified severe protein-calorie malnutrition: Secondary | ICD-10-CM | POA: Diagnosis not present

## 2022-07-21 DIAGNOSIS — N186 End stage renal disease: Secondary | ICD-10-CM

## 2022-07-21 DIAGNOSIS — I5022 Chronic systolic (congestive) heart failure: Secondary | ICD-10-CM | POA: Diagnosis not present

## 2022-07-21 DIAGNOSIS — B9620 Unspecified Escherichia coli [E. coli] as the cause of diseases classified elsewhere: Secondary | ICD-10-CM | POA: Diagnosis not present

## 2022-07-21 DIAGNOSIS — R7881 Bacteremia: Secondary | ICD-10-CM | POA: Diagnosis not present

## 2022-07-21 LAB — CBC
HCT: 33.4 % — ABNORMAL LOW (ref 39.0–52.0)
Hemoglobin: 11.1 g/dL — ABNORMAL LOW (ref 13.0–17.0)
MCH: 28.3 pg (ref 26.0–34.0)
MCHC: 33.2 g/dL (ref 30.0–36.0)
MCV: 85.2 fL (ref 80.0–100.0)
Platelets: 287 10*3/uL (ref 150–400)
RBC: 3.92 MIL/uL — ABNORMAL LOW (ref 4.22–5.81)
RDW: 15.1 % (ref 11.5–15.5)
WBC: 12.1 10*3/uL — ABNORMAL HIGH (ref 4.0–10.5)
nRBC: 0 % (ref 0.0–0.2)

## 2022-07-21 LAB — RENAL FUNCTION PANEL
Albumin: 1.9 g/dL — ABNORMAL LOW (ref 3.5–5.0)
Anion gap: 13 (ref 5–15)
BUN: 62 mg/dL — ABNORMAL HIGH (ref 8–23)
CO2: 23 mmol/L (ref 22–32)
Calcium: 9.1 mg/dL (ref 8.9–10.3)
Chloride: 98 mmol/L (ref 98–111)
Creatinine, Ser: 3.56 mg/dL — ABNORMAL HIGH (ref 0.61–1.24)
GFR, Estimated: 16 mL/min — ABNORMAL LOW (ref >60)
Glucose, Bld: 138 mg/dL — ABNORMAL HIGH (ref 70–99)
Phosphorus: 6.1 mg/dL — ABNORMAL HIGH (ref 2.5–4.6)
Potassium: 3.7 mmol/L (ref 3.5–5.1)
Sodium: 134 mmol/L — ABNORMAL LOW (ref 135–145)

## 2022-07-21 LAB — GLUCOSE, CAPILLARY
Glucose-Capillary: 131 mg/dL — ABNORMAL HIGH (ref 70–99)
Glucose-Capillary: 143 mg/dL — ABNORMAL HIGH (ref 70–99)
Glucose-Capillary: 219 mg/dL — ABNORMAL HIGH (ref 70–99)
Glucose-Capillary: 233 mg/dL — ABNORMAL HIGH (ref 70–99)
Glucose-Capillary: 89 mg/dL (ref 70–99)

## 2022-07-21 NOTE — Progress Notes (Signed)
[facility] Progress Note   Subjective: Patient seen in room today.  No complaints  Objective Vitals:   07/21/22 0500 07/21/22 0600 07/21/22 0800 07/21/22 1153  BP:   127/72   Pulse:  84 80 81  Resp:  15 18   Temp:   97.6 F (36.4 C) 97.7 F (36.5 C)  TempSrc:   Axillary Oral  SpO2:  92% 93% 94%  Weight: 53.2 kg     Height:       Physical Exam Gen: cachectic frail  Eyes: anicteric no injection ENT: MMM Neck: no JVD CV: Normal rate Abd: soft, thin, nontender Lungs: Bilateral chest rise with no increased work of breathing Neuro: Awake and alert, minimally interactive  Additional Objective Labs: Basic Metabolic Panel: Recent Labs  Lab 07/19/22 0440 07/20/22 0318 07/21/22 0252  NA 140 135 134*  K 4.2 3.9 3.7  CL 104 99 98  CO2 24 25 23  $ GLUCOSE 179* 174* 138*  BUN 76* 45* 62*  CREATININE 4.23* 3.23* 3.56*  CALCIUM 8.9 9.0 9.1  PHOS 6.2* 5.4* 6.1*   Liver Function Tests: Recent Labs  Lab 07/15/22 0255 07/16/22 0521 07/19/22 0440 07/20/22 0318 07/21/22 0252  AST 64*  --   --   --   --   ALT 61*  --   --   --   --   ALKPHOS 60  --   --   --   --   BILITOT 0.8  --   --   --   --   PROT 6.7  --   --   --   --   ALBUMIN 1.6*   < > 1.7* 1.8* 1.9*   < > = values in this interval not displayed.   No results for input(s): "LIPASE", "AMYLASE" in the last 168 hours. CBC: Recent Labs  Lab 07/17/22 0403 07/18/22 0748 07/19/22 0440 07/20/22 0318 07/21/22 0252  WBC 14.1* 12.4* 12.6* 14.9* 12.1*  HGB 10.7* 10.9* 10.4* 10.8* 11.1*  HCT 32.8* 33.9* 33.1* 32.5* 33.4*  MCV 85.6 86.5 87.8 85.8 85.2  PLT 302 288 282 278 287   Blood Culture    Component Value Date/Time   SDES BLOOD RIGHT HAND 07/12/2022 1148   SPECREQUEST  07/12/2022 1148    BOTTLES DRAWN AEROBIC AND ANAEROBIC Blood Culture adequate volume   CULT (A) 07/12/2022 1148    ESCHERICHIA COLI Confirmed Extended Spectrum Beta-Lactamase Producer (ESBL).  In bloodstream infections from  ESBL organisms, carbapenems are preferred over piperacillin/tazobactam. They are shown to have a lower risk of mortality.    REPTSTATUS 07/17/2022 FINAL 07/12/2022 1148    Cardiac Enzymes: No results for input(s): "CKTOTAL", "CKMB", "CKMBINDEX", "TROPONINI" in the last 168 hours. CBG: Recent Labs  Lab 07/20/22 1614 07/20/22 2036 07/21/22 0031 07/21/22 0805 07/21/22 1158  GLUCAP 127* 139* 131* 143* 219*   Iron Studies: No results for input(s): "IRON", "TIBC", "TRANSFERRIN", "FERRITIN" in the last 72 hours. @lablastinr3$ @ Studies/Results: No results found. Medications:    atorvastatin  80 mg Oral QHS   busPIRone  5 mg Oral TID   Chlorhexidine Gluconate Cloth  6 each Topical Daily   Chlorhexidine Gluconate Cloth  6 each Topical Q0600   famotidine  20 mg Oral Daily   feeding supplement  237 mL Oral BID BM   heparin  5,000 Units Subcutaneous Q8H   insulin aspart  0-9 Units Subcutaneous TID WC   metoprolol succinate  12.5 mg Oral Daily   polyethylene glycol  17 g  Oral Daily   sodium chloride flush  10-40 mL Intracatheter Q12H   Assessment/Plan **Sepsis secondary to UTI: on presentation.  Appears to be improving on antibiotic therapy and foley s/p change out.    **AKI on CKD 3:  Baseline kidney function as of 04/2022 in the 1.53m/dL range.  Now with severe, nonoliguric AKI.  Suspect multifactorial with contributions of sepsis/hypoperfusion, pyelonephritis, hypovolemia, obstruction.  In setting of poor po intake gave fluid challenge 2/7 but unfortunately no improvement in kidney function and BUN in 140s with mental status worse than baseline.     Dr. KJohnney Ouhad a long talk with daughter HCPOA- as not improving with supportive can offer a time limited trial of dialysis (1 week) to see if with additional time kidneys recover.  If not I would recommend hospice - give comorbids and frailty not a long term HD candidate.  She d/w brother and they are agreeable.  Completed 3 session of  dialysis without significant changes in his mental status.  At this point I do not think further dialysis would be beneficial.  It is still possible the patient could recover some kidney function but hard to say.  Creatinine slightly higher today.  Will continue to monitor his progress.  Would continue conversations with palliative care and family.   **HFrEF:   2022 EF 20-25%, judicious use of fluids PRN - none currently indicated.  Med mgmt limited by GFR currently.    **metabolic acidosis:  on oral bicarb   **Anemia:  Hb 10s, follow.   **Dementia: SNF resident - seems to be a off usual baseline according to daughter; certainly with a BUN 140s could be contributing to AMS.  Unfortunately no/minimal improvement with dialysis   Will follow - reach out with concerns.

## 2022-07-21 NOTE — TOC Initial Note (Signed)
Transition of Care [facility]) - Initial/Assessment Note    Patient Details  Name: [name] MRN: [id number] Date of Birth: [birth year]  Transition of Care [facility]) CM/SW Contact:    Benard Halsted, LCSW Phone Number: 07/21/2022, 9:07 AM  Clinical Narrative:                 CSW continuing to follow for medical stability.   Expected Discharge Plan: Skilled Nursing Facility Barriers to Discharge: Continued Medical Work up   Patient Goals and CMS Choice            Expected Discharge Plan and Services In-house Referral: Clinical Social Work   Post Acute Care Choice: [place] Living arrangements for the past 2 months: [place]                                      Prior Living Arrangements/Services Living arrangements for the past 2 months: [place] Lives with:: Facility Resident Patient language and need for interpreter reviewed:: Yes Do you feel safe going back to the place where you live?: Yes      Need for Family Participation in Patient Care: Yes (Comment) Care giver support system in place?: Yes (comment)   Criminal Activity/Legal Involvement Pertinent to Current Situation/Hospitalization: No - Comment as needed  Activities of Daily Living   ADL Screening (condition at time of admission) Patient's cognitive ability adequate to safely complete daily activities?: No Is the patient deaf or have difficulty hearing?: No Does the patient have difficulty seeing, even when wearing glasses/contacts?: No Does the patient have difficulty concentrating, remembering, or making decisions?: Yes Patient able to express need for assistance with ADLs?: No Does the patient have difficulty dressing or bathing?: Yes Independently performs ADLs?: No Communication: Independent Dressing (OT): Needs assistance Grooming: Needs assistance Feeding: Needs assistance Bathing: Needs assistance Toileting: Needs assistance Does the patient  have difficulty walking or climbing stairs?: Yes Weakness of Legs: Both Weakness of Arms/Hands: Both  Permission Sought/Granted Permission sought to share information with : Facility Sport and exercise psychologist, Family Supports Permission granted to share information with : No              Emotional Assessment Appearance:: Appears stated age Attitude/Demeanor/Rapport: Unable to Assess Affect (typically observed): Unable to Assess Orientation: : Oriented to Self Alcohol / Substance Use: Not Applicable Psych Involvement: No (comment)  Admission diagnosis:  Weakness [R53.1] Sepsis ([place]) [A41.9] Urinary tract infection associated with indwelling urethral catheter, initial encounter ([place]) [id number], N39.0] Sepsis with acute renal failure without septic shock, due to unspecified organism, unspecified acute renal failure type ([place]) [A41.9, R65.20, N17.9] Patient Active Problem List   Diagnosis Date Noted   Protein-calorie malnutrition, severe 07/20/2022   Pyelonephritis 07/16/2022   Sepsis with acute renal failure without septic shock ([place]) 07/14/2022   Bacteremia due to Escherichia coli 07/12/2022   Transaminitis 07/12/2022   Asymptomatic bacteriuria 05/04/2022   Malnutrition of moderate degree 05/03/2022   Anemia 05/01/2022   Tobacco use 05/01/2022   Malnutrition ([place]) 05/01/2022   Poor social situation 05/01/2022   Encephalopathy 04/30/2022   Elevated LFTs 04/30/2022   Gross hematuria    At high risk for elopement    Dehydration 11/17/2021   UTI (urinary tract infection) 11/17/2021   AKI (acute kidney injury) ([place]) 11/17/2021   Prolonged Q-T interval on ECG 11/17/2021   Hallux abductovalgus with bunions 10/10/2020   Hearing impairment  10/10/2020   Caregiver stress 10/10/2020   Balance problem 10/09/2020   Underweight 10/08/2020   Patient has difficulty administering medication 10/08/2020   Tobacco dependence    Aortic root dilatation (HCC)    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)    Chronic HFrEF (heart failure with reduced ejection fraction) ([place])    Lives alone    High risk social situation    Episodic tension-type headache, not intractable 12/07/2019   Weight loss 02/06/2019   B12 deficiency 07/21/2017   Osteoarthritis 05/12/2017   Hyperlipidemia associated with type 2 diabetes mellitus ([place]) 12/20/2016   Dementia ([place]) 07/06/2016   Goals of care, counseling/discussion 10/22/2015   Insomnia 01/09/2015   T2DM (type 2 diabetes mellitus) ([place]) 06/05/2014   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   Hypertension associated with diabetes ([place]) 04/21/2007   Chronic urticaria 04/21/2007   PCP:  [name], DO Pharmacy:   [id number] [place], [id number]: [phone]: [phone]    Social Determinants of Health (SDOH) [place] No Food Insecurity (07/12/2022)  Housing: Low Risk  (07/12/2022)  Transportation Needs: No Transportation Needs (07/12/2022)  Utilities: Not At Risk (07/12/2022)  Alcohol Screen: Low Risk  (04/27/2022)  Depression (PHQ2-9): Low Risk  (04/27/2022)  Financial Resource Strain: Low Risk  (04/27/2022)  Physical Activity: Inactive (04/27/2022)  Social Connections: Moderately Isolated (04/27/2022)  Stress: No Stress Concern Present (04/27/2022)  Tobacco Use: High Risk (07/16/2022)   SDOH Interventions:     Readmission Risk Interventions     No data to display

## 2022-07-21 NOTE — Progress Notes (Signed)
     Daily Progress Note Intern Pager: [phone]  Patient name: [name] Medical record number: [id number] Date of birth: [birth year] Age: 82 y.o. Gender: male  Primary Care Provider: [name], DO Consultants: Nephro, Palliative Code Status: Full  Pt Overview and Major Events to Date:  2/5 Admitted  2/8: Blood cx growing ESBL 2/9: Started 1wk trial of dialysis 2/12: Last day of dialysis  Assessment and Plan: [name] is an 82 y.o. male admitted for sepsis likely secondary to urinary source and acute renal failure.   Pertinent PMH/PSH includes chronic indwelling foley, dementia, T2DM, HFrEF (EF 20-25%), HTN, tobacco use, goiter.     * Bacteremia due to Escherichia coli Finish meropenem course for ESBL bacteremia. - Meropenem for 7 day course (2/8-2/14)  AKI (acute kidney injury) ([place]) Minimal/no change in mentation w/ short-term dialysis. Discussed w/ Nephrology and do not feel that continuing HD will provide further benefit at this time.  - No further plans for dialysis at this time - Nephrology consulted, appreciate recs - Indwelling foley - Strict I/O's - Daily RFP - Avoid nephrotoxic agents  Encephalopathy A&O to self today.  - Fall and delirium precautions   Chronic HFrEF (heart failure with reduced ejection fraction) ([place]) Echo 2022 showed EF 20-25%. - Cont home Toprol 12.'5mg'$  daily - Hold Lasix and Jardiance given AKI  Goals of care, counseling/discussion Family meeting w/ Public librarian (daughter), [place] (son), Dr. Jinny Sanders, and Palliative NP Baruch Goldmann). Family aware that dialysis is not a long-term option. Discussed GOC and code status. Daughter and son will discuss together and we plan to revisit their decision tomorrow. - Full code - Palliative consulted - F/u w/ family tomorrow about GOC  T2DM (type 2 diabetes mellitus) ([place]) - Cont sSSI - Holding home Jardiance - CBG monitoring qAC and qHS   FEN/GI: Dysphagia PPx: SQ Hep Dispo:Pending  GOC discussion, likely hospice but family has not officially decided yet.  Subjective:  NAEO.   Objective: Temp:  [97.6 F (36.4 C)-98.3 F (36.8 C)] 97.7 F (36.5 C) (02/14 1153) Pulse Rate:  [77-84] 81 (02/14 1153) Resp:  [11-18] 18 (02/14 0800) BP: (95-127)/(59-72) 127/72 (02/14 0800) SpO2:  [92 %-96 %] 94 % (02/14 1153) Weight:  [53.2 kg] 53.2 kg (02/14 0500) Physical Exam: General: Pleasant, laying comfortably in bed. NAD Neuro: A and O to self and place ([place]) Cardiovascular: RRR Respiratory: Normal WOB on RA Abdomen: Soft, nontender, nondistended.  Laboratory: Most recent CBC Lab Results  Component Value Date   WBC 12.1 (H) 07/21/2022   HGB 11.1 (L) 07/21/2022   HCT 33.4 (L) 07/21/2022   MCV 85.2 07/21/2022   PLT 287 07/21/2022   Most recent BMP    Latest Ref Rng & Units 07/21/2022    2:52 AM  BMP  Glucose 70 - 99 mg/dL 138   BUN 8 - 23 mg/dL 62   Creatinine 0.61 - 1.24 mg/dL 3.56   Sodium 135 - 145 mmol/L 134   Potassium 3.5 - 5.1 mmol/L 3.7   Chloride 98 - 111 mmol/L 98   CO2 22 - 32 mmol/L 23   Calcium 8.9 - 10.3 mg/dL 9.1     Arlyce Dice, MD 07/21/2022, 12:44 PM  PGY-1, [place] Intern pager: [phone], text pages welcome Secure chat group [place]

## 2022-07-21 NOTE — Consult Note (Signed)
   [facility] CM Inpatient Consult   07/21/2022  [name] 05-23-1941 [id number]   [place] Organization [ACO] Patient: [name] Medicare   Primary Care Provider:  [name], DO with [name] which is listed to provide the transition of care follow up   Patient screened for hospitalization with noted high risk score for unplanned readmission risk long length of stay and  to assess for potential [place] Management service needs for post hospital transition for care coordination.  Review of patient's electronic medical record reveals patient is being recommended for a skilled nursing facility level of care.  Patient is alert and frail noted.   Plan: No current needs for [facility] follow up noted.    Of note, [facility] Care Management/Population Health does not replace or interfere with any arrangements made by the Inpatient Transition of Care team.  For questions contact:   Natividad Brood, RN BSN [place]  [phone] business mobile phone Toll free office [phone]  *[place]  [phone] Fax number: [phone] [email or web address]    .

## 2022-07-21 NOTE — Progress Notes (Signed)
Patient ID: [name], male   DOB: 11-22-1940, 82 y.o.   MRN: [id number]    Progress Note from the Palliative Medicine Team at [facility]   Patient Name: [name]        Date: 07/21/2022 DOB: 08-Nov-1940  Age: 82 y.o. MRN#: [id number] Attending Physician: Lenoria Chime, MD Primary Care Physician: [name], DO Admit Date: 07/12/2022   Medical records reviewed    82 y.o. male admitted 07/12/22 from [facility] SNF with decreased urine output, AKI, UTI and pyelonephritis. PMHx; dementia, hearing impairment, HFrEF 20%, ICM, HTN, HLD, T2DM, goiter, hyperthyroidism, gout, chronic foley, tobacco use     Baseline kidney function as of 04/2022 in the 1.60m/dL range.  Now with severe, nonoliguric AKI.  Suspect multifactorial with contributions of sepsis/hypoperfusion, pyelonephritis, hypovolemia, obstruction.  Trial of HD-no significant renal recovery.  Nephrology do not feel that continuing HD will provide further benefit at this time.  Patient lethargic and intermittently confused.  He does not have medical decision-making capacity  Family face treatment option decisions, advanced directive decisions and anticipatory care needs.  This NP assessed patient at the bedside as a follow up for palliative medicine needs and emotional support and to meet with family as scheduled with attending team.  I met at the bedside with daughter/[name] continued education regarding current medical situation.  Education offered on patient's multiple comorbidities and long-term poor prognosis.  Education offered on the natural trajectory of end-stage renal disease as appropriate patient approaches end-of-life.  Education offered on the concept of adult failure to thrive and the limitations of medical interventions to prolong quality of life when the body does fail to thrive.  Educated family to consider DNR/DNI status understanding evidenced based poor outcomes in  similar hospitalized patient, as the cause of arrest is likely associated with advanced chronic illness rather than an easily reversible acute cardio-pulmonary event.  Education offered on hospice benefit philosophy and eligibility.         Patient's daughter speaks very clearly to her understanding of the current medical situation and feeling that a comfort approach is the best thing for her father at this time.  However son is struggling with decision.  Family wish to take time to process the next steps in the treatment plan.  Per attending team no further dialysis.  Prognosis is less than a few weeks.  PMT will continue to support holistically and follow up in the morning.  Education offered today regarding  the importance of continued conversation with family and their  medical providers regarding overall plan of care and treatment options,  ensuring decisions are within the context of the patients values and GOCs.  Questions and concerns addressed   Discussed with Dr  ZMarkus Jarvis  Total time spent on the unit was 75 minutes-greater than 50% of the time was spent in counseling and coordination of care.   MWadie LessenNP  Palliative Medicine Team Team Phone # [phone] [phone]

## 2022-07-22 DIAGNOSIS — B9620 Unspecified Escherichia coli [E. coli] as the cause of diseases classified elsewhere: Secondary | ICD-10-CM | POA: Diagnosis not present

## 2022-07-22 DIAGNOSIS — M79671 Pain in right foot: Secondary | ICD-10-CM | POA: Diagnosis not present

## 2022-07-22 DIAGNOSIS — M79672 Pain in left foot: Secondary | ICD-10-CM

## 2022-07-22 DIAGNOSIS — N186 End stage renal disease: Secondary | ICD-10-CM | POA: Diagnosis not present

## 2022-07-22 DIAGNOSIS — R7881 Bacteremia: Secondary | ICD-10-CM | POA: Diagnosis not present

## 2022-07-22 DIAGNOSIS — N179 Acute kidney failure, unspecified: Secondary | ICD-10-CM | POA: Diagnosis not present

## 2022-07-22 DIAGNOSIS — I5022 Chronic systolic (congestive) heart failure: Secondary | ICD-10-CM | POA: Diagnosis not present

## 2022-07-22 LAB — RENAL FUNCTION PANEL
Albumin: 1.9 g/dL — ABNORMAL LOW (ref 3.5–5.0)
Anion gap: 11 (ref 5–15)
BUN: 84 mg/dL — ABNORMAL HIGH (ref 8–23)
CO2: 23 mmol/L (ref 22–32)
Calcium: 9 mg/dL (ref 8.9–10.3)
Chloride: 99 mmol/L (ref 98–111)
Creatinine, Ser: 3.69 mg/dL — ABNORMAL HIGH (ref 0.61–1.24)
GFR, Estimated: 16 mL/min — ABNORMAL LOW (ref >60)
Glucose, Bld: 205 mg/dL — ABNORMAL HIGH (ref 70–99)
Phosphorus: 5.8 mg/dL — ABNORMAL HIGH (ref 2.5–4.6)
Potassium: 4 mmol/L (ref 3.5–5.1)
Sodium: 133 mmol/L — ABNORMAL LOW (ref 135–145)

## 2022-07-22 LAB — GLUCOSE, CAPILLARY
Glucose-Capillary: 180 mg/dL — ABNORMAL HIGH (ref 70–99)
Glucose-Capillary: 211 mg/dL — ABNORMAL HIGH (ref 70–99)
Glucose-Capillary: 151 mg/dL — ABNORMAL HIGH (ref 70–99)
Glucose-Capillary: 298 mg/dL — ABNORMAL HIGH (ref 70–99)

## 2022-07-22 MED ORDER — ACETAMINOPHEN 325 MG PO TABS
650.0000 mg | ORAL_TABLET | Freq: Once | ORAL | Status: DC
  Filled 2022-07-22: qty 2

## 2022-07-22 MED ORDER — HYDROMORPHONE HCL 2 MG PO TABS
1.0000 mg | ORAL_TABLET | Freq: Four times a day (QID) | ORAL | Status: DC | PRN
  Administered 2022-07-22 – 2022-07-26 (×2): 1 mg via ORAL
  Filled 2022-07-22 (×2): qty 1

## 2022-07-22 NOTE — Progress Notes (Signed)
     Daily Progress Note Intern Pager: [phone]  Patient name: [name] Medical record number: [id number] Date of birth: March 19, 1941 Age: 82 y.o. Gender: male  Primary Care Provider: [name], DO Consultants: Palliative, nephrology Code Status: Full  Pt Overview and Major Events to Date:  2/5 Admitted  2/8: Blood cx growing ESBL 2/9: Started 1wk trial of dialysis 2/12: Last day of dialysis  Assessment and Plan: [name] is an 82 y.o. male admitted for sepsis likely secondary to urinary source and acute renal failure. Given persistent renal failure and not a candidate for long term dialysis, pt will likely clinically deteriorate further. Pending GOC decision w/ daiughter Diplomatic Services operational officer) and son [place]) to guide next step in treatment (code status, dispo).   Pertinent PMH/PSH includes chronic indwelling foley, dementia, T2DM, HFrEF (EF 20-25%), HTN, tobacco use, goiter.      * Bacteremia due to Escherichia coli-resolved as of 07/22/2022 Now resolved. - s/p Meropenem for 7 day course (2/8-2/14) for ESBL bacteremia (Bcx and Ucx positive for ESBL)  AKI (acute kidney injury) ([place]) - No further plans for dialysis at this time - Nephrology consulted, appreciate recs - Indwelling foley - Strict I/O's - Daily RFP - Avoid nephrotoxic agents  Encephalopathy-resolved as of 07/22/2022 - Fall and delirium precautions   Chronic HFrEF (heart failure with reduced ejection fraction) ([place]) Echo 2022 showed EF 20-25%. - Cont home Toprol 12.'5mg'$  daily - Hold Lasix and Jardiance given AKI  Goals of care, counseling/discussion - f/u GOC w/ daughter and son - Palliative consulted, appreciate recs - Full code - Dispo: return to SNF vs pursue hospice; pending GOC  T2DM (type 2 diabetes mellitus) ([place]) - Cont sSSI - Holding home Jardiance - CBG monitoring qAC and qHS   FEN/GI: Dysphagia PPx: SQ Hep Dispo:Pending GOC for dispo.  Subjective:  NAEO.   Objective: Temp:   [97.6 F (36.4 C)-98.1 F (36.7 C)] 97.6 F (36.4 C) (02/15 0812) Pulse Rate:  [79-84] 81 (02/15 0812) Resp:  [15-18] 18 (02/15 0812) BP: (117-134)/(63-75) 134/71 (02/15 0812) SpO2:  [93 %-100 %] 100 % (02/15 3888) Physical Exam: General: Resting comfortably on RA. NAD. Cardiovascular: RRR Respiratory: CTAB. Normal WOB on RA. Abdomen: Soft, nontender, nondistended.  Laboratory: Most recent CBC Lab Results  Component Value Date   WBC 12.1 (H) 07/21/2022   HGB 11.1 (L) 07/21/2022   HCT 33.4 (L) 07/21/2022   MCV 85.2 07/21/2022   PLT 287 07/21/2022   Most recent BMP    Latest Ref Rng & Units 07/22/2022    4:45 AM  BMP  Glucose 70 - 99 mg/dL 205   BUN 8 - 23 mg/dL 84   Creatinine 0.61 - 1.24 mg/dL 3.69   Sodium 135 - 145 mmol/L 133   Potassium 3.5 - 5.1 mmol/L 4.0   Chloride 98 - 111 mmol/L 99   CO2 22 - 32 mmol/L 23   Calcium 8.9 - 10.3 mg/dL 9.0     Arlyce Dice, MD 07/22/2022, 9:28 AM  PGY-1, [place] Intern pager: [phone], text pages welcome Secure chat group [email or web address]

## 2022-07-22 NOTE — Progress Notes (Signed)
[facility] Progress Note   Subjective: Patient sitting in bed with no complaints today.  Palliative care continuing conversations with family.  Daughter feels like comfort care is best approach.  Son considering.  Objective Vitals:   07/21/22 2000 07/22/22 0021 07/22/22 0400 07/22/22 0812  BP: 123/66 124/63 122/67 134/71  Pulse: 84 80 81 81  Resp: 17   18  Temp: 97.7 F (36.5 C) 98.1 F (36.7 C) 98 F (36.7 C) 97.6 F (36.4 C)  TempSrc: Oral Oral Oral Oral  SpO2: 95% 100% 100% 100%  Weight:      Height:       Physical Exam Gen: cachectic frail  Eyes: anicteric no injection ENT: MMM Neck: no JVD CV: Normal rate Abd: soft, thin, nontender Lungs: Bilateral chest rise with no increased work of breathing Neuro: Awake and alert, minimally interactive  Additional Objective Labs: Basic Metabolic Panel: Recent Labs  Lab 07/20/22 0318 07/21/22 0252 07/22/22 0445  NA 135 134* 133*  K 3.9 3.7 4.0  CL 99 98 99  CO2 25 23 23  $ GLUCOSE 174* 138* 205*  BUN 45* 62* 84*  CREATININE 3.23* 3.56* 3.69*  CALCIUM 9.0 9.1 9.0  PHOS 5.4* 6.1* 5.8*   Liver Function Tests: Recent Labs  Lab 07/20/22 0318 07/21/22 0252 07/22/22 0445  ALBUMIN 1.8* 1.9* 1.9*   No results for input(s): "LIPASE", "AMYLASE" in the last 168 hours. CBC: Recent Labs  Lab 07/17/22 0403 07/18/22 0748 07/19/22 0440 07/20/22 0318 07/21/22 0252  WBC 14.1* 12.4* 12.6* 14.9* 12.1*  HGB 10.7* 10.9* 10.4* 10.8* 11.1*  HCT 32.8* 33.9* 33.1* 32.5* 33.4*  MCV 85.6 86.5 87.8 85.8 85.2  PLT 302 288 282 278 287   Blood Culture    Component Value Date/Time   SDES BLOOD RIGHT HAND 07/12/2022 1148   SPECREQUEST  07/12/2022 1148    BOTTLES DRAWN AEROBIC AND ANAEROBIC Blood Culture adequate volume   CULT (A) 07/12/2022 1148    ESCHERICHIA COLI Confirmed Extended Spectrum Beta-Lactamase Producer (ESBL).  In bloodstream infections from ESBL organisms, carbapenems are preferred over  piperacillin/tazobactam. They are shown to have a lower risk of mortality.    REPTSTATUS 07/17/2022 FINAL 07/12/2022 1148    Cardiac Enzymes: No results for input(s): "CKTOTAL", "CKMB", "CKMBINDEX", "TROPONINI" in the last 168 hours. CBG: Recent Labs  Lab 07/21/22 0805 07/21/22 1158 07/21/22 1618 07/21/22 2139 07/22/22 0832  GLUCAP 143* 219* 89 233* 180*   Iron Studies: No results for input(s): "IRON", "TIBC", "TRANSFERRIN", "FERRITIN" in the last 72 hours. @lablastinr3$ @ Studies/Results: No results found. Medications:    atorvastatin  80 mg Oral QHS   busPIRone  5 mg Oral TID   Chlorhexidine Gluconate Cloth  6 each Topical Daily   Chlorhexidine Gluconate Cloth  6 each Topical Q0600   famotidine  20 mg Oral Daily   feeding supplement  237 mL Oral BID BM   heparin  5,000 Units Subcutaneous Q8H   insulin aspart  0-9 Units Subcutaneous TID WC   metoprolol succinate  12.5 mg Oral Daily   polyethylene glycol  17 g Oral Daily   sodium chloride flush  10-40 mL Intracatheter Q12H   Assessment/Plan **Sepsis secondary to UTI: on presentation.  Appears to be improving on antibiotic therapy and foley s/p change out.    **AKI on CKD 3:  Baseline kidney function as of 04/2022 in the 1.7m/dL range.  Now with severe, nonoliguric AKI.  Suspect multifactorial with contributions of sepsis/hypoperfusion, pyelonephritis, hypovolemia, obstruction.  In setting  of poor po intake gave fluid challenge 2/7 but unfortunately no improvement in kidney function and BUN in 140s with mental status worse than baseline.     Dr. Johnney Ou had a long talk with daughter HCPOA- as not improving with supportive can offer a time limited trial of dialysis (1 week) to see if with additional time kidneys recover.  If not I would recommend hospice - give comorbids and frailty not a long term HD candidate.  She d/w brother and they are agreeable.  Completed 3 session of dialysis without significant changes in his mental  status.  At this point I do not think further dialysis would be beneficial.  It is still possible the patient could recover some kidney function but hard to say.  Creatinine and BUN continue to rise.  We do not have further recommendations at this time.  Therefore we will sign off.  Continue conversations with palliative care   **HFrEF:   2022 EF 20-25%, judicious use of fluids PRN - none currently indicated.  Med mgmt limited by GFR currently.    **metabolic acidosis:  on oral bicarb   **Anemia:  Hb 10s, follow.   **Dementia: SNF resident - seems to be a off usual baseline according to daughter; certainly with a BUN 140s could be contributing to AMS.  Unfortunately no/minimal improvement with dialysis   Will follow - reach out with concerns.

## 2022-07-22 NOTE — Progress Notes (Signed)
FMTS Interim Progress Note  Paged that pt was having shooting pain. Went to see pt, he reports that he was feeling some sharp pain in BL leg. Pt reports that the pain is ok, but feels like it is coming on.   Exam: Ext: Moves BL LE spontaneously. Nontender to palpation. Nonerythematous. No pain with movement.  A/P: Leg pain seems neuropathic in nature, but would avoid gabapentin given pt is still Full code and concern for renal function. - Give additional 1 time dose of Tylenol 62m PO - Cont Tylenol 650 prn q6h - Pending GOC discussion before more aggressive pain management   ZArlyce Dice MD 07/22/2022, 11:54 AM PGY-1, [place] Lake[place]icine Service pager [phone]

## 2022-07-22 NOTE — Progress Notes (Signed)
Patient ID: [name], male   DOB: 08-15-1940, 82 y.o.   MRN: [id number]    Progress Note from the Palliative Medicine Team at [facility]   Patient Name: [name]        Date: 07/22/2022 DOB: 30-Apr-1941  Age: 82 y.o. MRN#: [id number] Attending Physician: McDiarmid, Blane Ohara, MD Primary Care Physician: [name], DO Admit Date: 07/12/2022   Medical records reviewed    82 y.o. male admitted 07/12/22 from [facility] SNF with decreased urine output, AKI, UTI and pyelonephritis. PMHx; dementia, hearing impairment, HFrEF 20%, ICM, HTN, HLD, T2DM, goiter, hyperthyroidism, gout, chronic foley, tobacco use     Baseline kidney function as of 04/2022 in the 1.39m/dL range.  Now with severe, nonoliguric AKI.  Suspect multifactorial with contributions of sepsis/hypoperfusion, pyelonephritis, hypovolemia, obstruction.  Trial of HD-no significant renal recovery.  Nephrology do not feel that continuing HD will provide further benefit at this time.  Patient lethargic and intermittently confused.  He does not have medical decision-making capacity  Family face treatment option decisions, advanced directive decisions and anticipatory care needs.  This NP assessed patient at the bedside as a follow up for palliative medicine needs and emotional support.  Patient is oriented to person only, he is lethargic, he did enjoy a full cup of orange juice that I offered him.   I spoke today by telephone with daughter/[name]  for continued education regarding current medical situation.  Ongoing  education offered on patient's multiple comorbidities and long-term poor prognosis.  Education offered on the natural trajectory of end-stage renal disease as appropriate patient approaches end-of-life.  Today I reviewed with daughter Mr. BRichtersadvanced directive regarding a natural death.  She is aware of his document and is at peace with focus of care directed at comfort and dignity at this time.  She  expresses concern regarding the patient's report of significant leg pain down into his heels.  Patient only had Tylenol ordered.  Education offered on the utilization of opioids to treat pain, discussed risks and benefits and side effects.    Again focus is on comfort  Plan of care -DNR/DNI-documented today -No further dialysis as discussed with family yesterday -Symptom management          -Pain-Dilaudid 1 mg p.o. every 6 hours as needed         Daughter wishes to continue to take time to process the next steps in the treatment plan.  She is in dialogue with her siblings  PMT will continue to support holistically and follow up in the morning.  Education offered today regarding  the importance of continued conversation with family and their  medical providers regarding overall plan of care and treatment options,  ensuring decisions are within the context of the patients values and GOCs.  Questions and concerns addressed   Discussed with attending team  Total time spent on the unit was 50  minutes-greater than 50% of the time was spent in counseling and coordination of care.   MWadie LessenNP  Palliative Medicine Team Team Phone # [phone] [phone]

## 2022-07-23 DIAGNOSIS — N179 Acute kidney failure, unspecified: Secondary | ICD-10-CM | POA: Diagnosis not present

## 2022-07-23 DIAGNOSIS — Z7189 Other specified counseling: Secondary | ICD-10-CM | POA: Diagnosis not present

## 2022-07-23 DIAGNOSIS — M79671 Pain in right foot: Secondary | ICD-10-CM | POA: Diagnosis not present

## 2022-07-23 DIAGNOSIS — I5022 Chronic systolic (congestive) heart failure: Secondary | ICD-10-CM | POA: Diagnosis not present

## 2022-07-23 DIAGNOSIS — M79672 Pain in left foot: Secondary | ICD-10-CM | POA: Diagnosis not present

## 2022-07-23 DIAGNOSIS — N186 End stage renal disease: Secondary | ICD-10-CM | POA: Diagnosis not present

## 2022-07-23 LAB — RENAL FUNCTION PANEL
Albumin: 2 g/dL — ABNORMAL LOW (ref 3.5–5.0)
Anion gap: 9 (ref 5–15)
BUN: 93 mg/dL — ABNORMAL HIGH (ref 8–23)
CO2: 25 mmol/L (ref 22–32)
Calcium: 9.5 mg/dL (ref 8.9–10.3)
Chloride: 100 mmol/L (ref 98–111)
Creatinine, Ser: 3.59 mg/dL — ABNORMAL HIGH (ref 0.61–1.24)
GFR, Estimated: 16 mL/min — ABNORMAL LOW (ref >60)
Glucose, Bld: 182 mg/dL — ABNORMAL HIGH (ref 70–99)
Phosphorus: 5 mg/dL — ABNORMAL HIGH (ref 2.5–4.6)
Potassium: 4.4 mmol/L (ref 3.5–5.1)
Sodium: 134 mmol/L — ABNORMAL LOW (ref 135–145)

## 2022-07-23 LAB — GLUCOSE, CAPILLARY
Glucose-Capillary: 185 mg/dL — ABNORMAL HIGH (ref 70–99)
Glucose-Capillary: 297 mg/dL — ABNORMAL HIGH (ref 70–99)
Glucose-Capillary: 288 mg/dL — ABNORMAL HIGH (ref 70–99)
Glucose-Capillary: 266 mg/dL — ABNORMAL HIGH (ref 70–99)

## 2022-07-23 NOTE — Progress Notes (Signed)
Message sent to care team to consider discontinue HD catheter given pt will not be on dialysis moving forward. Order received to remove line by Dr. Joylene Grapes.

## 2022-07-23 NOTE — Progress Notes (Signed)
     Daily Progress Note Intern Pager: [phone]  Patient name: [name] Medical record number: [id number] Date of birth: Dec 07, 1940 Age: 82 y.o. Gender: male  Primary Care Provider: [name], DO Consultants: Palliative, nephrology Code Status: DNR  Pt Overview and Major Events to Date:  2/5 Admitted  2/8: Blood cx growing ESBL 2/9: Started 1wk trial of dialysis 2/12: Last day of dialysis  Assessment and Plan: [name] is an 82 y.o. male admitted for sepsis likely secondary to urinary source and acute renal failure. Now DNR pursuing hospice  Goals of care, counseling/discussion Now DNR. No more labs - Palliative consulted, appreciate recs - SNF vs hospice pending family decision  AKI (acute kidney injury) ([place]) - No further plans for dialysis at this time. Dialysis catheter discontinued 2/16) - Nephrology consulted, appreciate recs - Indwelling foley - Strict I/O's - Daily RFP - Avoid nephrotoxic agents  Chronic HFrEF (heart failure with reduced ejection fraction) ([place]) Echo 2022 showed EF 20-25%. - Cont home Toprol 12.94m daily - Hold Lasix and Jardiance given AKI  T2DM (type 2 diabetes mellitus) ([place] - Cont sSSI - Holding home Jardiance - CBG monitoring qAC and qHS  FEN/GI: Dysphagia PPx: SQ Hep Dispo: SNF vs hospice pending GOC discussion  Subjective:  Denies somatic complaints  Objective: Temp:  [97.4 F (36.3 C)-98.1 F (36.7 C)] 97.4 F (36.3 C) (02/16 1138) Pulse Rate:  [70-101] 98 (02/16 1138) Resp:  [16-18] 18 (02/16 1138) BP: (110-122)/(65-85) 122/71 (02/16 1138) SpO2:  [97 %-100 %] 97 % (02/15 2010) Physical Exam: General: in no acute distress HEENT: normocephalic and atraumatic Respiratory: non-labored breathing and on RA Extremities: moving all extremities spontaneously Gastrointestinal: non-tender and non-distended Cardiovascular: tachycardic  Laboratory: Most recent CBC Lab Results  Component Value Date   WBC 12.1  (H) 07/21/2022   HGB 11.1 (L) 07/21/2022   HCT 33.4 (L) 07/21/2022   MCV 85.2 07/21/2022   PLT 287 07/21/2022   Most recent BMP    Latest Ref Rng & Units 07/23/2022    5:25 AM  BMP  Glucose 70 - 99 mg/dL 182   BUN 8 - 23 mg/dL 93   Creatinine 0.61 - 1.24 mg/dL 3.59   Sodium 135 - 145 mmol/L 134   Potassium 3.5 - 5.1 mmol/L 4.4   Chloride 98 - 111 mmol/L 100   CO2 22 - 32 mmol/L 25   Calcium 8.9 - 10.3 mg/dL 9.5     TCamelia Phenes MD 07/23/2022, 12:49 PM  PGY-1, [place] pager: [phone] text pages welcome Secure chat group [place]

## 2022-07-23 NOTE — Plan of Care (Signed)

## 2022-07-23 NOTE — Progress Notes (Signed)
Patient ID: [name], male   DOB: [birth year], 82 y.o.   MRN: [id number]    Progress Note from the Palliative Medicine Team at [facility]   Patient Name: [name]        Date: 07/23/2022 DOB: September 29, 1940  Age: 82 y.o. MRN#: [id number] Attending Physician: McDiarmid, Blane Ohara, MD Primary Care Physician: [name], DO Admit Date: 07/12/2022   Medical records reviewed    82 y.o. male admitted 07/12/22 from [facility] SNF with decreased urine output, AKI, UTI and pyelonephritis. PMHx; dementia, hearing impairment, HFrEF 20%, ICM, HTN, HLD, T2DM, goiter, hyperthyroidism, gout, chronic foley, tobacco use     Baseline kidney function as of 04/2022 in the 1.28m/dL range.  Now with severe, nonoliguric AKI.  Suspect multifactorial with contributions of sepsis/hypoperfusion, pyelonephritis, hypovolemia, obstruction.  Trial of HD-no significant renal recovery.  Nephrology do not feel that continuing HD will provide further benefit at this time.  Patient lethargic and intermittently confused.  He does not have medical decision-making capacity  Family face treatment option decisions, advanced directive decisions and anticipatory care needs.  This NP assessed patient at the bedside as a follow up for palliative medicine needs and emotional support.    Patient is oriented to person only, he is lethargic, he is tolerating his meals at 75%.   He actually worked with physical therapy today.   I spoke  with daughter/[name]  for continued education regarding current medical situation.  Ongoing  education offered on patient's multiple comorbidities and long-term poor prognosis.  Education offered on the natural trajectory of end-stage renal disease as appropriate patient approaches end-of-life.  She understands.   Ultimately focus of care is on comfort and dignity for [name]   Plan of care -DNR/DNI-documented today -No further dialysis as discussed with family -Symptom  management          -Pain-Dilaudid 1 mg p.o. every 6 hours as needed -Diet as tolerated         Daughter wishes to continue to take time to process the next steps in the treatment plan.  She is in dialogue with her siblings.  Plan is to continue current medical interventions through the weekend.  Monday depending on patient's status decision will be made for residential hospice versus skilled nursing facility.  MOST form will need to be completed prior to discharge  PMT will continue to support holistically  Education offered today regarding  the importance of continued conversation with family and their  medical providers regarding overall plan of care and treatment options,  ensuring decisions are within the context of the patients values and GOCs.  Questions and concerns addressed   messaged the attending team via secure chat the daughter is requesting a phone call for an update today.   Total time spent on the unit was 50  minutes-greater than 50% of the time was spent in counseling and coordination of care.   MWadie LessenNP  Palliative Medicine Team Team Phone # [phone] [phone]

## 2022-07-23 NOTE — Progress Notes (Addendum)
FMTS Brief Progress Note   Called patient's daughter as she had called earlier asking the nurse if the team could call patient. She says that she has been trying to speak with the team since Wednesday as she was not sure if we were still taking care of him since the palliative family conversation on Wednesday.   I informed daughter that patient has stopped HD and that they removed his catheter today as HD was benefiting him or improving kidney function. I informed her that patient is not comfort care as per their palliative family conversation. We are still treating blood pressure, electrolytes, etc. I also let her know that unfortunately there are not many more treatments we can offer the patient at this time since the HD did not improve kidney function.   Daughter requested daily phone call from team.   Lowry Ram, MD  PGY-1 [place]

## 2022-07-23 NOTE — Progress Notes (Addendum)
Physical Therapy Treatment Patient Details Name: Clarice Leer MRN: [id number] DOB: 10-14-1940 Today's Date: 07/23/2022   History of Present Illness 82 y.o. male admitted 07/12/22 from [facility] SNF with decreased urine output, AKI, UTI and pyelonephritis. PMHx; dementia, hearing impairment, HFrEF 20%, ICM, HTN, HLD, T2DM, goiter, hyperthyroidism, gout, chronic foley, tobacco use.    PT Comments    Pt tolerated today's session well, remains limited by cognition but able to participate with cueing. Pt sat EOB to look out of the window, initially requiring minA for balance but progressing to minG with 1UE support on the bedrail and cueing to decrease posterior lean. Pt tolerating 5 trials of sit<>stand transfers with modA and 1HHA, unable to maintain standing balance without assistance and tolerating ~10 seconds of standing before requiring a seated rest break due to fatigue and weakness, unable to tolerate lateral sways or stepping in place. Pt pleasant throughout session but difficulty following pt's conversation, pt turning head to the right at one point and then asking where the therapist went, reoriented pt as able. Acute PT will continue to follow up with pt as appropriate, discharge recommendations remain the same.     Recommendations for follow up therapy are one component of a multi-disciplinary discharge planning process, led by the attending physician.  Recommendations may be updated based on patient status, additional functional criteria and insurance authorization.  Follow Up Recommendations  Long-term institutional care without follow-up therapy Can patient physically be transported by private vehicle: No   Assistance Recommended at Discharge Frequent or constant Supervision/Assistance  Patient can return home with the following A lot of help with walking and/or transfers;A lot of help with bathing/dressing/bathroom;Assistance with cooking/housework;Direct supervision/assist for  medications management;Assist for transportation;Assistance with feeding;Direct supervision/assist for financial management   Equipment Recommendations  None recommended by PT    Recommendations for Other Services       Precautions / Restrictions Precautions Precautions: Fall Restrictions Weight Bearing Restrictions: No     Mobility  Bed Mobility Overal bed mobility: Needs Assistance Bed Mobility: Supine to Sit, Sit to Supine     Supine to sit: Mod assist, HOB elevated Sit to supine: Mod assist, HOB elevated   General bed mobility comments: assist for BLE management and trunk support, utilizing HHA to pull into sitting, trunk management needed for return to supine    Transfers Overall transfer level: Needs assistance Equipment used: 1 person hand held assist Transfers: Sit to/from Stand Sit to Stand: Mod assist           General transfer comment: pt standing x5 trials with 1HHA and use of bed rail, cued for upright posture, pt maintaining standing ~10 seconds each trial before returning to sitting, rest breaks provided between trials, noted fatigue at the end and pt requesting to return to supine    Ambulation/Gait               General Gait Details: unable   Stairs             Wheelchair Mobility    Modified Rankin (Stroke Patients Only)       Balance Overall balance assessment: Needs assistance Sitting-balance support: Feet supported, Single extremity supported Sitting balance-Leahy Scale: Poor Sitting balance - Comments: minA provided initially while EOB to maintain balance, after cueing for hip flexion, pt able to maintain balance EOB with minG, intermittent posterior lean but pt able to self correct Postural control: Posterior lean Standing balance support: Bilateral upper extremity supported Standing balance-Leahy Scale: Poor Standing  balance comment: mod to maintain standing with BLE utilizing bed                             Cognition Arousal/Alertness: Awake/alert Behavior During Therapy: Flat affect Overall Cognitive Status: Impaired/Different from baseline Area of Impairment: Attention, Memory, Following commands, Safety/judgement, Awareness, Problem solving                 Orientation Level: Disoriented to, Place, Time, Situation Current Attention Level: Sustained Memory: Decreased short-term memory Following Commands: Follows one step commands inconsistently, Follows one step commands with increased time Safety/Judgement: Decreased awareness of safety, Decreased awareness of deficits Awareness: Emergent Problem Solving: Slow processing, Decreased initiation, Difficulty sequencing, Requires verbal cues General Comments: pt with increased time for command following with verbal, tactile, and visual cues provided, inconsistently following. Pt conversing throughout session however unable to follow pt's thought process. Pt with one instance of looking to the R and then asking where this therapist went        Exercises      General Comments General comments (skin integrity, edema, etc.): room air, VSS      Pertinent Vitals/Pain Pain Assessment Pain Assessment: Faces Faces Pain Scale: No hurt    Home Living                          Prior Function            PT Goals (current goals can now be found in the care plan section) Acute Rehab PT Goals Patient Stated Goal: none stated today PT Goal Formulation: Patient unable to participate in goal setting Time For Goal Achievement: 07/29/22 Potential to Achieve Goals: Fair Progress towards PT goals: Progressing toward goals    Frequency    Min 2X/week      PT Plan Current plan remains appropriate    Co-evaluation              AM-PAC PT "6 Clicks" Mobility   Outcome Measure  Help needed turning from your back to your side while in a flat bed without using bedrails?: A Little Help needed moving from lying on your  back to sitting on the side of a flat bed without using bedrails?: A Lot Help needed moving to and from a bed to a chair (including a wheelchair)?: A Lot Help needed standing up from a chair using your arms (e.g., wheelchair or bedside chair)?: A Lot Help needed to walk in hospital room?: Total Help needed climbing 3-5 steps with a railing? : Total 6 Click Score: 11    End of Session Equipment Utilized During Treatment: Gait belt Activity Tolerance: Patient tolerated treatment well Patient left: in bed;with call bell/phone within reach;with bed alarm set Nurse Communication: Mobility status PT Visit Diagnosis: Other abnormalities of gait and mobility (R26.89);Muscle weakness (generalized) (M62.81)     Time: [id number] PT Time Calculation (min) (ACUTE ONLY): 22 min  Charges:  $Therapeutic Activity: 8-22 mins                     Charlynne Cousins, PT DPT Acute Rehabilitation Services Office [phone]    Luvenia Heller 07/23/2022, 4:19 PM

## 2022-07-24 DIAGNOSIS — N179 Acute kidney failure, unspecified: Secondary | ICD-10-CM | POA: Diagnosis not present

## 2022-07-24 DIAGNOSIS — Z515 Encounter for palliative care: Secondary | ICD-10-CM | POA: Diagnosis not present

## 2022-07-24 DIAGNOSIS — Z7189 Other specified counseling: Secondary | ICD-10-CM | POA: Diagnosis not present

## 2022-07-24 LAB — GLUCOSE, CAPILLARY: Glucose-Capillary: 163 mg/dL — ABNORMAL HIGH (ref 70–99)

## 2022-07-24 MED ORDER — HYDROMORPHONE HCL 1 MG/ML IJ SOLN
0.5000 mg | Freq: Every day | INTRAMUSCULAR | Status: DC
  Administered 2022-07-24 – 2022-07-25 (×2): 0.5 mg via INTRAVENOUS
  Filled 2022-07-24 (×2): qty 0.5

## 2022-07-24 MED ORDER — GLYCOPYRROLATE 1 MG PO TABS: 1.0000 mg | ORAL_TABLET | ORAL | Status: DC | PRN

## 2022-07-24 MED ORDER — POLYVINYL ALCOHOL 1.4 % OP SOLN: 1.0000 [drp] | Freq: Four times a day (QID) | OPHTHALMIC | Status: DC | PRN

## 2022-07-24 MED ORDER — GLYCOPYRROLATE 0.2 MG/ML IJ SOLN: 0.2000 mg | INTRAMUSCULAR | Status: DC | PRN

## 2022-07-24 MED ORDER — BIOTENE DRY MOUTH MT LIQD: 15.0000 mL | OROMUCOSAL | Status: DC | PRN

## 2022-07-24 NOTE — Progress Notes (Signed)
     Daily Progress Note Intern Pager: [phone]  Patient name: [name] Medical record number: [id number] Date of birth: [birth year] Age: 82 y.o. Gender: male  Primary Care Provider: [name], DO Consultants: palliative, nephrology Code Status: DNR  Pt Overview and Major Events to Date:  2/5 Admitted  2/8: Blood cx growing ESBL 2/9: Started 1wk trial of dialysis 2/12: Last day of dialysis  Assessment and Plan:  [name] is an 82 y.o. male admitted for sepsis likely secondary to urinary source and acute renal failure. Now DNR pursuing hospice via palliative.   * AKI (acute kidney injury) ([place]) - No further plans for dialysis at this time. Dialysis catheter discontinued 2/16 - Nephrology consulted, appreciate recs - Indwelling foley - Strict I/O's - Avoid nephrotoxic agents  Goals of care, counseling/discussion Per palliative, continue with current medical interventions through the weekend and reassess for residential hospice vs SNF at the beginning of the week. Continue discussion with daughter and family.  - Palliative consulted, appreciate recs - SNF vs hospice pending family decision  Chronic HFrEF (heart failure with reduced ejection fraction) ([place]) Echo 2022 showed EF 20-25%. - Cont home Toprol 12.29m daily - Hold Lasix and Jardiance given AKI  T2DM (type 2 diabetes mellitus) ([place] - Cont sSSI - Holding home Jardiance - CBG monitoring qAC and qHS     FEN/GI: Dysphagia PPx: SQ Hep Dispo: SNF vs hospice pending [place], continue with current medical interventions for the time being.   Subjective:  Patient reports no pain and has no complaints.   Objective: Temp:  [97.4 F (36.3 C)-99 F (37.2 C)] 98.7 F (37.1 C) (02/17 0443) Pulse Rate:  [84-101] 84 (02/17 0443) Resp:  [16-18] 18 (02/17 0443) BP: (104-134)/(65-86) 121/72 (02/17 0443) SpO2:  [96 %] 96 % (02/16 2000) Physical Exam: General: No distress, sitting upright in bed   Cardiovascular: rrr without m/g/r Respiratory: Normal WOB Abdomen: No distension  Extremities: Able to move extremities   Laboratory: Most recent CBC Lab Results  Component Value Date   WBC 12.1 (H) 07/21/2022   HGB 11.1 (L) 07/21/2022   HCT 33.4 (L) 07/21/2022   MCV 85.2 07/21/2022   PLT 287 07/21/2022   Most recent BMP    Latest Ref Rng & Units 07/23/2022    5:25 AM  BMP  Glucose 70 - 99 mg/dL 182   BUN 8 - 23 mg/dL 93   Creatinine 0.61 - 1.24 mg/dL 3.59   Sodium 135 - 145 mmol/L 134   Potassium 3.5 - 5.1 mmol/L 4.4   Chloride 98 - 111 mmol/L 100   CO2 22 - 32 mmol/L 25   Calcium 8.9 - 10.3 mg/dL 9.5      MErskine Emery MD 07/24/2022, 5:58 AM  PGY-2, [place]ustaIntern pager: [phone] text pages welcome Secure chat group [place]

## 2022-07-24 NOTE — Progress Notes (Signed)
   Palliative Medicine Inpatient Follow Up Note HPI:  82 y.o. male admitted 07/12/22 from [facility] SNF with decreased urine output, AKI, UTI and pyelonephritis. PMHx; dementia, hearing impairment, HFrEF 20%, ICM, HTN, HLD, T2DM, goiter, hyperthyroidism, gout, chronic foley, tobacco use.    Palliative care has been asked to get involved in the setting of progressive kidney failure to further address goals of care.   Today's Discussion 07/24/2022  *Please note that this is a verbal dictation therefore any spelling or grammatical errors are due to the "[place] One" system interpretation.  Chart reviewed inclusive of vital signs, progress notes, laboratory results, and diagnostic images.   I met with [name] at bedside this morning. He is resting comfortably and pleasantly disoriented. He denies pain this morning, shortness of breath or nausea.   I called patients daughter, [name]. We reviewed the idea of comfort measures. We talked about transition to comfort measures in house and what that would entail inclusive of medications to control pain, dyspnea, agitation, nausea, itching, and hiccups.  We discussed stopping all uneccessary measures such as cardiac monitoring, blood draws, needle sticks, and frequent vital signs. [name] shares understanding and agreement with this plan.  We discussed the various options of inpatient hospice versus hospice back at [facility]. Patients daughter would ideally like inpatient hospice placement. We discussed treating his chronic pain standing in the evening hours and having a liaison see if he is inpatient appropriate.   Patients daughter would ideally like patient to get OOB to chair today which has been discussed with the nursing staff.   Questions and concerns addressed/Palliative Support Provided.   Objective Assessment: Vital Signs Vitals:   07/24/22 0443 07/24/22 0810  BP: 121/72 107/63  Pulse: 84 (!) 102  Resp: 18 16  Temp: 98.7 F  (37.1 C)   SpO2:      Intake/Output Summary (Last 24 hours) at 07/24/2022 [id number] Last data filed at 07/24/2022 [id number] Gross per 24 hour  Intake 10 ml  Output 1755 ml  Net -1745 ml    Last Weight  Most recent update: 07/21/2022  6:09 AM    Weight  53.2 kg (117 lb 4.6 oz)            Gen: Frail elderly AA M in NAD HEENT: moist mucous membranes CV: Irregular rate and regular rhythm  PULM:  On RA, breathing is even and nonlabored ABD: soft/nontender  EXT: No edema  Neuro: Pleasantly confused  SUMMARY OF RECOMMENDATIONS   DNAR/DNI  Comfort Measures  Started low dose dilaudid standing in the evening  Unrestricted visitation  Appreciate hospice liaison evaluation for [employer]   Ongoing support  Billing based on MDM: High ______________________________________________________________________________________ [place] Team Team Cell Phone: [phone] Please utilize secure chat with additional questions, if there is no response within 30 minutes please call the above phone number  Palliative Medicine Team providers are available by phone from 7am to 7pm daily and can be reached through the team cell phone.  Should this patient require assistance outside of these hours, please call the patient's attending physician.

## 2022-07-24 NOTE — Progress Notes (Signed)
[facility]) Hospital Liaison Note  Received request from Tacey Ruiz, NP  for family interest in [facility] Spoke with patient's daughter to confirm interest and explain services. Assessed patient at bedside and MD reviewed chart. Patient is hospice appropriate, but not approved for [facility]. Daughter made aware. Can provide hospice services at [facility] at discharge. Will follow for discharge disposition or if anything changes.    Hospital staff made aware.  Please call with any hospice related questions.   Thank you,  Zigmund Gottron RN  [facility] Liaison [phone]

## 2022-07-25 DIAGNOSIS — Z7189 Other specified counseling: Secondary | ICD-10-CM | POA: Diagnosis not present

## 2022-07-25 DIAGNOSIS — Z515 Encounter for palliative care: Secondary | ICD-10-CM | POA: Diagnosis not present

## 2022-07-25 NOTE — Progress Notes (Signed)
   Palliative Medicine Inpatient Follow Up Note HPI:  82 y.o. male admitted 07/12/22 from [facility] SNF with decreased urine output, AKI, UTI and pyelonephritis. PMHx; dementia, hearing impairment, HFrEF 20%, ICM, HTN, HLD, T2DM, goiter, hyperthyroidism, gout, chronic foley, tobacco use.    Palliative care has been asked to get involved in the setting of progressive kidney failure to further address goals of care.   Today's Discussion 07/25/2022  *Please note that this is a verbal dictation therefore any spelling or grammatical errors are due to the "[place] One" system interpretation.  Chart reviewed inclusive of vital signs, progress notes, laboratory results, and diagnostic images. MAR reviewed got standing dilaudid overnight and has required no additional doses.   I met with [name] at bedside this afternoon. He is awake and pleasantly disoriented. He denies pain, nausea, and shortness of breath.   I called patients daughter, [name] who shared with me a variety of grievances regarding the nursing care the patient has received. I directed her to the patient experience department and provided therapeutic support through empathetic reflection.   Plan for patient to [place] with hospice, will gain better clarity with TOC tomorrow.   Questions and concerns addressed/Palliative Support Provided.   Objective Assessment: Vital Signs Vitals:   07/25/22 0831 07/25/22 1146  BP:    Pulse:    Resp: 20 18  Temp: 98 F (36.7 C)   SpO2:      Intake/Output Summary (Last 24 hours) at 07/25/2022 1437 Last data filed at 07/25/2022 1145 Gross per 24 hour  Intake --  Output 2650 ml  Net -2650 ml    Last Weight  Most recent update: 07/21/2022  6:09 AM    Weight  53.2 kg (117 lb 4.6 oz)            Gen: Frail elderly AA M in NAD HEENT: moist mucous membranes CV: Irregular rate and regular rhythm  PULM:  On RA, breathing is even and nonlabored ABD:  soft/nontender  EXT: No edema  Neuro: Pleasantly confused  SUMMARY OF RECOMMENDATIONS   DNAR/DNI  Comfort Measures  Continue low dose dilaudid standing in the evening  Unrestricted visitation  Appreciate hospice either at [facility] or back at [facility]   Ongoing support  Billing based on MDM: High ______________________________________________________________________________________ [place] Team Team Cell Phone: [phone] Please utilize secure chat with additional questions, if there is no response within 30 minutes please call the above phone number  Palliative Medicine Team providers are available by phone from 7am to 7pm daily and can be reached through the team cell phone.  Should this patient require assistance outside of these hours, please call the patient's attending physician.

## 2022-07-25 NOTE — TOC Progression Note (Signed)
Transition of Care [facility]) - Progression Note    Patient Details  Name: [name] MRN: [id number] Date of Birth: 19-Jun-1940  Transition of Care [facility]) CM/SW Contact  Emeterio Reeve, [place] Phone Number: 07/25/2022, 4:27 PM  Clinical Narrative:     CSW spoke to pts daughter Shantell by phone. CSW explained that per [facility] place he does not meet inpatient requirements. [place] wanted more information about why he does not meet criteria. CSW reached out to Judson Roch with [facility] to ask if she will call [place] to explain.   Pt is LTC with [facility]. LCSW called Nevin Bloodgood with [facility] to inquire about him returning with hospice support. Nevin Bloodgood did not answer, CSW left voice message requesting call be returned Monday to weekday [place].   Expected Discharge Plan: [place] Barriers to Discharge: Continued Medical Work up  Expected Discharge Plan and Services In-house Referral: Clinical Social Work   Post Acute Care Choice: [place] Living arrangements for the past 2 months: [place]                                       Social Determinants of Health (SDOH) Interventions [place]: No Food Insecurity (07/12/2022)  Housing: Low Risk  (07/12/2022)  Transportation Needs: No Transportation Needs (07/12/2022)  Utilities: Not At Risk (07/12/2022)  Alcohol Screen: Low Risk  (04/27/2022)  Depression (PHQ2-9): Low Risk  (04/27/2022)  Financial Resource Strain: Low Risk  (04/27/2022)  Physical Activity: Inactive (04/27/2022)  Social Connections: Moderately Isolated (04/27/2022)  Stress: No Stress Concern Present (04/27/2022)  Tobacco Use: High Risk (07/16/2022)    Readmission Risk Interventions     No data to display         Emeterio Reeve, [place] Social Worker

## 2022-07-25 NOTE — Progress Notes (Signed)
     Daily Progress Note Intern Pager: [phone]  Patient name: [name] Medical record number: [id number] Date of birth: 12/18/1940 Age: 82 y.o. Gender: male  Primary Care Provider: [name], DO Consultants: Nephrology (s/o), palliative Code Status: DNR  Pt Overview and Major Events to Date:  2/5 Admitted  2/8: Blood cx growing ESBL 2/9: Started 1wk trial of dialysis 2/12: Last day of dialysis 2/17: palliative discussion with family - decision for comfort care made   Assessment and Plan: [name] is an 82 y.o. male admitted for sepsis likely secondary to urinary source and acute renal failure. Now DNR pursuing hospice via palliative.    * AKI (acute kidney injury) ([place]) - No further plans for dialysis at this time. Dialysis catheter discontinued 2/16 - Indwelling foley   Goals of care, counseling/discussion Per palliative, continue with current medical interventions through the weekend and reassess for residential hospice vs SNF at the beginning of the week. Continue discussion with daughter and family.  - Palliative consulted, appreciate recs - SNF vs hospice pending family decision  Chronic HFrEF (heart failure with reduced ejection fraction) ([place]) Echo 2022 showed EF 20-25%. - d/c'd heart failure medications as he is now comfort care   T2DM (type 2 diabetes mellitus) ([place]) -d/c'd insulin and CBGs as he is now comfort care.     FEN/GI: Dys 1 PPx: None, is comfort care Dispo: d/c to hospice when bed is available   Subjective:  Patient was awake this morning, appeared comfortable, denied any pain.  Objective: Temp:  [97.6 F (36.4 C)-98.8 F (37.1 C)] 97.6 F (36.4 C) (02/17 2000) Pulse Rate:  [91-102] 91 (02/17 2000) Resp:  [14-16] 14 (02/18 0400) BP: (107-135)/(62-63) 135/63 (02/17 2000) SpO2:  [95 %] 95 % (02/17 2000) Physical Exam: General: Awake, alert, pleasant, NAD Cardiovascular: Well-perfused Respiratory: Breathing comfortably on  room air  Laboratory: Most recent CBC Lab Results  Component Value Date   WBC 12.1 (H) 07/21/2022   HGB 11.1 (L) 07/21/2022   HCT 33.4 (L) 07/21/2022   MCV 85.2 07/21/2022   PLT 287 07/21/2022   Most recent BMP    Latest Ref Rng & Units 07/23/2022    5:25 AM  BMP  Glucose 70 - 99 mg/dL 182   BUN 8 - 23 mg/dL 93   Creatinine 0.61 - 1.24 mg/dL 3.59   Sodium 135 - 145 mmol/L 134   Potassium 3.5 - 5.1 mmol/L 4.4   Chloride 98 - 111 mmol/L 100   CO2 22 - 32 mmol/L 25   Calcium 8.9 - 10.3 mg/dL 9.5      [name], DO 07/25/2022, 8:06 AM  PGY-2, [place] Intern pager: [phone], text pages welcome Secure chat group [place]

## 2022-07-26 DIAGNOSIS — N179 Acute kidney failure, unspecified: Secondary | ICD-10-CM | POA: Diagnosis not present

## 2022-07-26 DIAGNOSIS — Z7189 Other specified counseling: Secondary | ICD-10-CM | POA: Diagnosis not present

## 2022-07-26 MED ORDER — HYDROMORPHONE HCL 2 MG PO TABS: 1.0000 mg | ORAL_TABLET | Freq: Four times a day (QID) | ORAL | 0 refills | Status: DC | PRN

## 2022-07-26 MED ORDER — HYDROMORPHONE HCL 1 MG/ML IJ SOLN: 0.5000 mg | Freq: Every day | INTRAMUSCULAR | 0 refills | Status: DC

## 2022-07-26 MED ORDER — GLYCOPYRROLATE 1 MG PO TABS: 1.0000 mg | ORAL_TABLET | ORAL | Status: DC | PRN

## 2022-07-26 MED ORDER — GLYCOPYRROLATE 0.2 MG/ML IJ SOLN: 0.2000 mg | INTRAMUSCULAR | Status: DC | PRN

## 2022-07-26 NOTE — NC FL2 (Signed)
[place] LEVEL OF CARE FORM     IDENTIFICATION  Patient Name: [name] Birthdate: 1940-09-10 Sex: male Admission Date (Current Location): 07/12/2022  [facility] and [place] Number:  Herbalist and Address:  [facility] [place], [place] [address], [place], [place] [address]      Provider Number: [id number]  Attending Physician Name and Address:  McDiarmid, Blane Ohara, MD  Relative Name and Phone Number:  [name] (Daughter)  [phone] [facility])    Current Level of Care: Hospital Recommended Level of Care: [place] Prior Approval Number:    Date Approved/Denied:   PASRR Number:    Discharge Plan: SNF    Current Diagnoses: Patient Active Problem List   Diagnosis Date Noted   Protein-calorie malnutrition, severe 07/20/2022   Pyelonephritis 07/16/2022   Sepsis with acute renal failure without septic shock ([place]) 07/14/2022   Transaminitis 07/12/2022   Asymptomatic bacteriuria 05/04/2022   Malnutrition of moderate degree 05/03/2022   Anemia 05/01/2022   Tobacco use 05/01/2022   Malnutrition ([place]) 05/01/2022   Poor social situation 05/01/2022   Elevated LFTs 04/30/2022   Gross hematuria    At high risk for elopement    Dehydration 11/17/2021   UTI (urinary tract infection) 11/17/2021   AKI (acute kidney injury) ([place]) 11/17/2021   Prolonged Q-T interval on ECG 11/17/2021   Hallux abductovalgus with bunions 10/10/2020   Hearing impairment 10/10/2020   Caregiver stress 10/10/2020   Balance problem 10/09/2020   Underweight 10/08/2020   Patient has difficulty administering medication 10/08/2020   Tobacco dependence    Aortic root dilatation (HCC)    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)    Chronic HFrEF (heart failure with reduced ejection fraction) ([place])    Lives alone    High risk social situation    Episodic tension-type headache, not intractable 12/07/2019   Weight  loss 02/06/2019   B12 deficiency 07/21/2017   Osteoarthritis 05/12/2017   Hyperlipidemia associated with type 2 diabetes mellitus ([place]) 12/20/2016   Dementia ([place]) 07/06/2016   Goals of care, counseling/discussion 10/22/2015   Insomnia 01/09/2015   T2DM (type 2 diabetes mellitus) ([place]) 06/05/2014   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   Hypertension associated with diabetes ([place]) 04/21/2007   Chronic urticaria 04/21/2007    Orientation RESPIRATION BLADDER Height & Weight     Self  Normal Indwelling catheter Weight: 117 lb 4.6 oz (53.2 kg) Height:  6' (182.9 cm)  BEHAVIORAL SYMPTOMS/MOOD NEUROLOGICAL BOWEL NUTRITION STATUS      Continent Diet (see d/c summary)  AMBULATORY STATUS COMMUNICATION OF NEEDS Skin   Extensive Assist Verbally Normal                       Personal Care Assistance Level of Assistance  Bathing, Feeding, Dressing Bathing Assistance: Maximum assistance Feeding assistance: Independent Dressing Assistance: Maximum assistance     Functional Limitations Info  Sight, Hearing, Speech Sight Info: Adequate Hearing Info: Adequate Speech Info: Adequate    SPECIAL CARE FACTORS FREQUENCY   (Hospice Care)                    Contractures Contractures Info: Not present    Additional Factors Info  Code Status, Allergies Code Status Info: DNR Allergies Info: Diovan (valsartan), ace inhibitors           Current Medications (07/26/2022):  This is  the current hospital active medication list Current Facility-Administered Medications  Medication Dose Route Frequency Provider Last Rate Last Admin   acetaminophen (TYLENOL) tablet 650 mg  650 mg Oral Q6H PRN Arlyce Dice, MD   650 mg at 07/22/22 1106   antiseptic oral rinse (BIOTENE) solution 15 mL  15 mL Topical PRN Rosezella Rumpf, NP       feeding supplement (ENSURE ENLIVE / ENSURE PLUS) liquid 237 mL  237 mL Oral BID BM Lenoria Chime, MD   237 mL at 07/26/22 1031   glycopyrrolate (ROBINUL) tablet 1 mg  1 mg Oral Q4H PRN Rosezella Rumpf, NP       Or   glycopyrrolate (ROBINUL) injection 0.2 mg  0.2 mg Subcutaneous Q4H PRN Rosezella Rumpf, NP       Or   glycopyrrolate (ROBINUL) injection 0.2 mg  0.2 mg Intravenous Q4H PRN Rosezella Rumpf, NP       HYDROmorphone (DILAUDID) injection 0.5 mg  0.5 mg Intravenous QHS Rosezella Rumpf, NP   0.5 mg at 07/25/22 2227   HYDROmorphone (DILAUDID) tablet 1 mg  1 mg Oral Q6H PRN Knox Royalty, NP   1 mg at 07/26/22 [id number]   polyvinyl alcohol (LIQUIFILM TEARS) 1.4 % ophthalmic solution 1 drop  1 drop Both Eyes QID PRN Rosezella Rumpf, NP         Discharge Medications: Please see discharge summary for a list of discharge medications.  Relevant Imaging Results:  Relevant Lab Results:   Additional Information SSN [id number]  Bethann Berkshire, LCSW

## 2022-07-26 NOTE — TOC Transition Note (Signed)
Transition of Care [facility]) - CM/SW Discharge Note   Patient Details  Name: [name] MRN: [id number] Date of Birth: 09-Jun-1940  [facility]) CM/SW Contact:  Bethann Berkshire, [place] Phone Number: 07/26/2022, 2:49 PM   Clinical Narrative:     CSW confirmed with [facility] that pt can return there with hospice. They are contracted with [facility]. CSW called pt's daughter and updated her. She was to complete paperwork with [facility] for hospice though informed CSW she would like to see if [facility] could provide hospice services. CSW spoke with Nevin Bloodgood about this who made arrangements for [facility] hospice and updated pt's daughter. [facility] RN will be at [facility] at 6pm today. CSW updated attending team and RN of DC today.     Patient will DC to: [place] Anticipated DC date: 07/26/22 Family notified: Daughter Quarry manager by: Corey Harold   Per MD patient ready for DC to [place], patient, patient's family, and facility notified of DC. Discharge Summary and FL2 sent to facility. RN to call report prior to discharge [phone] ). DC packet on chart. Ambulance transport requested for patient.   CSW will sign off for now as social work intervention is no longer needed. Please consult [name] again if new needs arise.   Final next level of care: [place] Barriers to Discharge: No Barriers Identified   Patient Goals and CMS Choice      Discharge Placement                Patient [facility] Patient to be transferred to facility by: [place] Name of family member notified: Daughter [name] Patient and family notified of of transfer: 07/26/22  Discharge Plan and Services Additional resources added to the After Visit Summary for   In-house Referral: Clinical Social Work   Post Acute Care Choice: [place]                               Social Determinants of Health (SDOH) Interventions [place]: No Food Insecurity (07/12/2022)  Housing: Low Risk  (07/12/2022)  Transportation Needs: No Transportation Needs (07/12/2022)  Utilities: Not At Risk (07/12/2022)  Alcohol Screen: Low Risk  (04/27/2022)  Depression (PHQ2-9): Low Risk  (04/27/2022)  Financial Resource Strain: Low Risk  (04/27/2022)  Physical Activity: Inactive (04/27/2022)  Social Connections: Moderately Isolated (04/27/2022)  Stress: No Stress Concern Present (04/27/2022)  Tobacco Use: High Risk (07/16/2022)     Readmission Risk Interventions     No data to display

## 2022-07-26 NOTE — Progress Notes (Signed)
Nutrition Brief Note  Chart reviewed.Pt has transitioned to comfort care. Pt with diet and Ensure ordered. Allow pt to eat and drink as desires for comfort.   No further nutrition interventions planned at this time. Please re-consult as needed.   Hermina Barters RD, LDN Clinical Dietitian See Shea Evans for contact information.

## 2022-07-26 NOTE — Progress Notes (Signed)
   Daily Progress Note Intern Pager: [phone]  Patient name: [name] Medical record number: [id number] Date of birth: Dec 11, 1940 Age: 82 y.o. Gender: male  Primary Care Provider: [name], DO Consultants: Nephrology (signed off), palliative Code Status: DNR  Pt Overview and Major Events to Date:  2/5 Admitted  2/8: Blood cx growing ESBL 2/9: Started 1wk trial of dialysis 2/12: Last day of dialysis 2/17: palliative discussion with family - decision for comfort care made   Assessment and Plan: [name] is an 82 y.o. male admitted for sepsis likely secondary to urinary source and acute renal failure. Now DNR pursuing hospice via palliative.  * AKI (acute kidney injury) ([place]) - No further plans for dialysis at this time. Dialysis catheter discontinued 2/16 - Indwelling foley  Goals of care, counseling/discussion Comfort care. Hospice-bound  Chronic HFrEF (heart failure with reduced ejection fraction) ([place]) Echo 2022 showed EF 20-25%. - d/c'd heart failure medications as he is now comfort care   T2DM (type 2 diabetes mellitus) ([place]) -d/c'd insulin and CBGs as he is now comfort care.   FEN/GI: Dys 1 PPx: None, is comfort care Dispo: d/c to hospice when bed is available   Subjective:  Patient endorses " a little bit" of pain. He was being cleaned by nursing.  Objective: Temp:  [97.8 F (36.6 C)-98 F (36.7 C)] 97.8 F (36.6 C) (02/18 2225) Pulse Rate:  [98-110] 98 (02/18 2225) Resp:  [18-19] 18 (02/18 2225) BP: (111)/(72) 111/72 (02/18 2225) SpO2:  [91 %-94 %] 91 % (02/18 2225) Physical Exam: General: in no acute distress HEENT: normocephalic and atraumatic Respiratory: non-labored breathing and on RA Extremities: moving all extremities spontaneously Gastrointestinal: non-distended  Laboratory: Most recent CBC Lab Results  Component Value Date   WBC 12.1 (H) 07/21/2022   HGB 11.1 (L) 07/21/2022   HCT 33.4 (L) 07/21/2022   MCV 85.2 07/21/2022    PLT 287 07/21/2022   Most recent BMP    Latest Ref Rng & Units 07/23/2022    5:25 AM  BMP  Glucose 70 - 99 mg/dL 182   BUN 8 - 23 mg/dL 93   Creatinine 0.61 - 1.24 mg/dL 3.59   Sodium 135 - 145 mmol/L 134   Potassium 3.5 - 5.1 mmol/L 4.4   Chloride 98 - 111 mmol/L 100   CO2 22 - 32 mmol/L 25   Calcium 8.9 - 10.3 mg/dL 9.5     Camelia Phenes, MD 07/26/2022, 12:36 PM  PGY-1, [place] Intern pager: [phone], text pages welcome Secure chat group [place]

## 2022-07-26 NOTE — Discharge Summary (Signed)
[facility] Discharge Summary  Patient name: [name] Medical record number: [id number] Date of birth: 1941/03/26 Age: 82 y.o. Gender: male Date of Admission: 07/12/2022  Date of Discharge: 07/26/2022  Admitting Physician: Arlyce Dice, MD  Primary Care Provider: [name], DO Consultants: nephrology (signed-off), palliative  Indication for Hospitalization: sepsis  Brief Hospital Course:  [name] is an 82 y.o. male who presented from SNF with altered mental status and decreased output from chronic foley. He was admitted to [facility] for sepsis 2/2 urinary source.  Acute Renal Failure Encephalopathy  Presented with creatinine 6.82 from baseline ~1. Thought to be multifactorial: post-renal secondary to clogged foley catheter as well as a pre-renal component secondary to sepsis. Foley replaced and sepsis treated as above with no improvement in renal function. Nephro consulted and recommended trial of HD from 2/9 - 2/14. Encephalopathy due to uremia secondary to acute renal failure. Patient had BUN of 147 max. No further plans for dialysis as patient now comfort care.  Sepsis  Pyelonephritis  Urinary Retention w/ Chronic Indwelling Foley Pt presented from SNF w/ 1 day hx of no output from foley and AMS. Initially, met SIRs criteria and had elevated lactic acid. Foley was replaced and drained  Secondary to urinary source. Blood culture grew ESBL. Infectious disease was consulted and recommended meropenem for 7 day course which patient completed. Indwelling foley still in-place.  Chronic HFrEF Echo 2022 showed EF 20-25%. Heart medications dc'd as now comfort care.  DM II Insulin and CBGs d/c'd as now comfort care  PCP issues requiring follow-up None as patient now comfort care and hospice-bound  Discharge Diagnoses/Problem List:  AKI (acute kidney injury) Chronic HFrEF (heart failure with reduced ejection  fraction) T2DM (type 2 diabetes mellitus) ([place])  Disposition: [place]  Discharge Condition: stable  Discharge Exam: BP 111/72 (BP Location: Left Arm)   Pulse 98   Temp 97.8 F (36.6 C) (Oral)   Resp 18   Ht 6' (1.829 m)   Wt 53.2 kg   SpO2 91%   BMI 15.91 kg/m  General: in no acute distress HEENT: normocephalic and atraumatic Respiratory: non-labored breathing and on RA Extremities: moving all extremities spontaneously Gastrointestinal: non-distended  Significant Labs and Imaging:  No results for input(s): "WBC", "HGB", "HCT", "PLT" in the last 48 hours. No results for input(s): "NA", "K", "CL", "CO2", "GLUCOSE", "BUN", "CREATININE", "CALCIUM", "MG", "PHOS", "ALKPHOS", "AST", "ALT", "ALBUMIN", "PROTEIN" in the last 48 hours.  Discharge Medications:  Allergies as of 07/26/2022       Reactions   Ace Inhibitors Swelling, Other (See Comments)   Site of swelling not known by the patient   Diovan [valsartan] Swelling, Other (See Comments)   Site of swelling not known by the patient        Medication List     STOP taking these medications    aspirin 81 MG chewable tablet   atorvastatin 80 MG tablet Commonly known as: LIPITOR   busPIRone 5 MG tablet Commonly known as: BUSPAR   cefTRIAXone 250 mg/mL in lidocaine (with preservative) injection   ciprofloxacin 250 MG tablet Commonly known as: CIPRO   empagliflozin 10 MG Tabs tablet Commonly known as: Jardiance   furosemide 40 MG tablet Commonly known as: LASIX   metoprolol succinate 25 MG 24 hr tablet Commonly known as: TOPROL-XL   nicotine 7 mg/24hr patch Commonly known as: NICODERM CQ - dosed in mg/24 hr   PROBIOTIC PO   sodium  chloride 0.9 % infusion       TAKE these medications    acetaminophen 325 MG tablet Commonly known as: TYLENOL Take 650 mg by mouth every 4 (four) hours as needed (pain, fever 100F or above).   Geri-Tussin 100 MG/5ML liquid Generic drug: guaiFENesin Take 200  mg by mouth every 4 (four) hours as needed for cough.   glycopyrrolate 1 MG tablet Commonly known as: ROBINUL Take 1 tablet (1 mg total) by mouth every 4 (four) hours as needed (excessive secretions).   glycopyrrolate 0.2 MG/ML injection Commonly known as: ROBINUL Inject 1 mL (0.2 mg total) into the skin every 4 (four) hours as needed (excessive secretions).   glycopyrrolate 0.2 MG/ML injection Commonly known as: ROBINUL Inject 1 mL (0.2 mg total) into the vein every 4 (four) hours as needed (excessive secretions).   HYDROmorphone 1 MG/ML injection Commonly known as: DILAUDID Inject 0.5 mLs (0.5 mg total) into the vein at bedtime.   HYDROmorphone 2 MG tablet Commonly known as: DILAUDID Take 0.5 tablets (1 mg total) by mouth every 6 (six) hours as needed for moderate pain.   ipratropium-albuterol 0.5-2.5 (3) MG/3ML Soln Commonly known as: DUONEB Take 3 mLs by nebulization every 4 (four) hours as needed (shortness of breath, wheezing).   Nutritional Drink Liqd Take 120 mLs by mouth 2 (two) times daily. Med Pass 2.0.       Discharge Instructions: Please refer to Patient Instructions section of EMR for full details.  Patient was counseled important signs and symptoms that should prompt return to medical care, changes in medications, dietary instructions, activity restrictions, and follow up appointments.   Follow-Up Appointments: none  Camelia Phenes, MD 07/26/2022, 12:45 PM PGY-1, [address]

## 2022-07-26 NOTE — Progress Notes (Addendum)
FMTS Brief Progress Note  Spoke to [employer] (patient's daughter) who NP Tacey Ruiz said, "wants to feel heard by the medical team it seems. She has a lot of grievances."  [name] asked for an update about patient's care.  During this conversation, I answered questions pertaining to the patient's current treatment and provided updates, and outlined the treatment plan moving forward.  Camelia Phenes, MD 2/19/20247:41 AM

## 2022-07-27 DIAGNOSIS — G9340 Encephalopathy, unspecified: Secondary | ICD-10-CM | POA: Diagnosis not present

## 2022-07-27 DIAGNOSIS — I5022 Chronic systolic (congestive) heart failure: Secondary | ICD-10-CM | POA: Diagnosis not present

## 2022-07-27 DIAGNOSIS — N10 Acute tubulo-interstitial nephritis: Secondary | ICD-10-CM | POA: Diagnosis not present

## 2022-07-27 DIAGNOSIS — F0390 Unspecified dementia without behavioral disturbance: Secondary | ICD-10-CM | POA: Diagnosis not present

## 2022-07-27 NOTE — Care Management Important Message (Signed)
Important Message  Patient Details  Name: [name] MRN: [id number] Date of Birth: April 10, 1941   Medicare Important Message Given:  Yes   Patient left prior to IM delivery will mail to the patients home address.   Khadejah Son 07/27/2022, 9:30 AM

## 2022-08-06 DEATH — deceased
